# Patient Record
Sex: Female | Born: 1940
Health system: Southern US, Community
[De-identification: ages and names within clinical notes are randomized; demographics above are authoritative.]

## PROBLEM LIST (undated history)

## (undated) DIAGNOSIS — R51 Headache: Secondary | ICD-10-CM

## (undated) DIAGNOSIS — D649 Anemia, unspecified: Secondary | ICD-10-CM

## (undated) DIAGNOSIS — F419 Anxiety disorder, unspecified: Secondary | ICD-10-CM

## (undated) DIAGNOSIS — R7309 Other abnormal glucose: Secondary | ICD-10-CM

## (undated) DIAGNOSIS — Z923 Personal history of irradiation: Secondary | ICD-10-CM

## (undated) DIAGNOSIS — F329 Major depressive disorder, single episode, unspecified: Secondary | ICD-10-CM

## (undated) DIAGNOSIS — IMO0001 Reserved for inherently not codable concepts without codable children: Secondary | ICD-10-CM

## (undated) DIAGNOSIS — R519 Headache, unspecified: Secondary | ICD-10-CM

## (undated) DIAGNOSIS — M810 Age-related osteoporosis without current pathological fracture: Secondary | ICD-10-CM

## (undated) DIAGNOSIS — R0600 Dyspnea, unspecified: Secondary | ICD-10-CM

## (undated) DIAGNOSIS — Z5189 Encounter for other specified aftercare: Secondary | ICD-10-CM

## (undated) DIAGNOSIS — G4700 Insomnia, unspecified: Secondary | ICD-10-CM

## (undated) DIAGNOSIS — C801 Malignant (primary) neoplasm, unspecified: Secondary | ICD-10-CM

## (undated) DIAGNOSIS — R112 Nausea with vomiting, unspecified: Secondary | ICD-10-CM

## (undated) DIAGNOSIS — E785 Hyperlipidemia, unspecified: Secondary | ICD-10-CM

## (undated) DIAGNOSIS — K219 Gastro-esophageal reflux disease without esophagitis: Secondary | ICD-10-CM

## (undated) DIAGNOSIS — I10 Essential (primary) hypertension: Secondary | ICD-10-CM

## (undated) DIAGNOSIS — Z9889 Other specified postprocedural states: Secondary | ICD-10-CM

## (undated) DIAGNOSIS — C50919 Malignant neoplasm of unspecified site of unspecified female breast: Secondary | ICD-10-CM

## (undated) DIAGNOSIS — F32A Depression, unspecified: Secondary | ICD-10-CM

## (undated) DIAGNOSIS — K5790 Diverticulosis of intestine, part unspecified, without perforation or abscess without bleeding: Secondary | ICD-10-CM

## (undated) HISTORY — DX: Diverticulosis of intestine, part unspecified, without perforation or abscess without bleeding: K57.90

## (undated) HISTORY — DX: Essential (primary) hypertension: I10

## (undated) HISTORY — DX: Age-related osteoporosis without current pathological fracture: M81.0

## (undated) HISTORY — DX: Hyperlipidemia, unspecified: E78.5

## (undated) HISTORY — DX: Reserved for inherently not codable concepts without codable children: IMO0001

## (undated) HISTORY — DX: Encounter for other specified aftercare: Z51.89

## (undated) HISTORY — PX: BREAST BIOPSY: SHX20

## (undated) HISTORY — DX: Other abnormal glucose: R73.09

## (undated) HISTORY — DX: Anxiety disorder, unspecified: F41.9

## (undated) HISTORY — DX: Anemia, unspecified: D64.9

## (undated) HISTORY — PX: FRACTURE SURGERY: SHX138

## (undated) HISTORY — PX: SIGMOIDOSCOPY: SUR1295

## (undated) HISTORY — DX: Insomnia, unspecified: G47.00

## (undated) HISTORY — DX: Personal history of irradiation: Z92.3

## (undated) HISTORY — PX: ORIF FEMUR FRACTURE: SHX2119

## (undated) HISTORY — DX: Malignant (primary) neoplasm, unspecified: C80.1

## (undated) HISTORY — DX: Malignant neoplasm of unspecified site of unspecified female breast: C50.919

## (undated) HISTORY — DX: Gastro-esophageal reflux disease without esophagitis: K21.9

---

## 1973-06-14 HISTORY — PX: TUBAL LIGATION: SHX77

## 1981-06-14 DIAGNOSIS — Z5189 Encounter for other specified aftercare: Secondary | ICD-10-CM

## 1981-06-14 HISTORY — DX: Encounter for other specified aftercare: Z51.89

## 1981-06-14 HISTORY — PX: ABDOMINAL HYSTERECTOMY: SHX81

## 1985-06-14 HISTORY — PX: BILATERAL OOPHORECTOMY: SHX1221

## 1997-06-14 HISTORY — PX: INGUINAL HERNIA REPAIR: SUR1180

## 1998-01-07 ENCOUNTER — Inpatient Hospital Stay (HOSPITAL_COMMUNITY): Admission: EM | Admit: 1998-01-07 | Discharge: 1998-01-10 | Payer: Self-pay | Admitting: Emergency Medicine

## 2000-11-04 ENCOUNTER — Other Ambulatory Visit: Admission: RE | Admit: 2000-11-04 | Discharge: 2000-11-04 | Payer: Self-pay | Admitting: Obstetrics & Gynecology

## 2004-05-12 ENCOUNTER — Encounter: Admission: RE | Admit: 2004-05-12 | Discharge: 2004-05-12 | Payer: Self-pay | Admitting: *Deleted

## 2006-06-27 ENCOUNTER — Encounter: Payer: Self-pay | Admitting: Family Medicine

## 2006-11-15 ENCOUNTER — Encounter: Admission: RE | Admit: 2006-11-15 | Discharge: 2006-11-15 | Payer: Self-pay | Admitting: Obstetrics & Gynecology

## 2007-05-22 ENCOUNTER — Encounter: Admission: RE | Admit: 2007-05-22 | Discharge: 2007-05-22 | Payer: Self-pay | Admitting: Obstetrics & Gynecology

## 2007-07-11 ENCOUNTER — Encounter: Payer: Self-pay | Admitting: Family Medicine

## 2007-07-11 LAB — CONVERTED CEMR LAB: Vit D, 25-Hydroxy: 25.6 ng/mL

## 2007-07-26 ENCOUNTER — Encounter: Payer: Self-pay | Admitting: Family Medicine

## 2008-03-04 ENCOUNTER — Encounter: Admission: RE | Admit: 2008-03-04 | Discharge: 2008-03-04 | Payer: Self-pay | Admitting: Family Medicine

## 2008-03-04 LAB — HM MAMMOGRAPHY: HM Mammogram: NORMAL

## 2008-04-09 ENCOUNTER — Encounter: Payer: Self-pay | Admitting: Family Medicine

## 2008-04-09 LAB — CONVERTED CEMR LAB
Creatinine, Ser: 0.7 mg/dL
Glucose, Bld: 143 mg/dL

## 2009-07-04 ENCOUNTER — Encounter: Payer: Self-pay | Admitting: Family Medicine

## 2009-07-04 LAB — CONVERTED CEMR LAB
Cholesterol: 127 mg/dL
Creatinine, Ser: 0.6 mg/dL
Direct LDL: 75 mg/dL
Glucose, Bld: 119 mg/dL
Vit D, 25-Hydroxy: 22.3 ng/mL

## 2010-01-02 ENCOUNTER — Ambulatory Visit: Payer: Self-pay | Admitting: Family Medicine

## 2010-01-02 DIAGNOSIS — I10 Essential (primary) hypertension: Secondary | ICD-10-CM | POA: Insufficient documentation

## 2010-01-02 DIAGNOSIS — F411 Generalized anxiety disorder: Secondary | ICD-10-CM | POA: Insufficient documentation

## 2010-01-07 ENCOUNTER — Ambulatory Visit: Payer: Self-pay | Admitting: Family Medicine

## 2010-01-09 ENCOUNTER — Encounter: Payer: Self-pay | Admitting: Family Medicine

## 2010-01-09 LAB — CONVERTED CEMR LAB
ALT: 32 units/L (ref 0–35)
AST: 32 units/L (ref 0–37)
Albumin: 4 g/dL (ref 3.5–5.2)
Alkaline Phosphatase: 55 units/L (ref 39–117)
BUN: 12 mg/dL (ref 6–23)
Bilirubin, Direct: 0.2 mg/dL (ref 0.0–0.3)
CO2: 26 meq/L (ref 19–32)
Calcium: 9.4 mg/dL (ref 8.4–10.5)
Chloride: 96 meq/L (ref 96–112)
Cholesterol: 197 mg/dL (ref 0–200)
Creatinine, Ser: 0.7 mg/dL (ref 0.4–1.2)
GFR calc non Af Amer: 90.68 mL/min (ref >60)
Glucose, Bld: 129 mg/dL — ABNORMAL HIGH (ref 70–99)
HDL: 26.6 mg/dL — ABNORMAL LOW (ref >39.00)
LDL Cholesterol: 136 mg/dL — ABNORMAL HIGH (ref 0–99)
Potassium: 4.9 meq/L (ref 3.5–5.1)
Sodium: 132 meq/L — ABNORMAL LOW (ref 135–145)
Total Bilirubin: 0.7 mg/dL (ref 0.3–1.2)
Total CHOL/HDL Ratio: 7
Total Protein: 7.9 g/dL (ref 6.0–8.3)
Triglycerides: 174 mg/dL — ABNORMAL HIGH (ref 0.0–149.0)
VLDL: 34.8 mg/dL (ref 0.0–40.0)

## 2010-01-14 ENCOUNTER — Ambulatory Visit: Payer: Self-pay | Admitting: Family Medicine

## 2010-01-15 LAB — CONVERTED CEMR LAB: Glucose, Bld: 136 mg/dL — ABNORMAL HIGH (ref 70–99)

## 2010-01-27 ENCOUNTER — Encounter: Payer: Self-pay | Admitting: Family Medicine

## 2010-01-27 DIAGNOSIS — K219 Gastro-esophageal reflux disease without esophagitis: Secondary | ICD-10-CM

## 2010-01-27 DIAGNOSIS — M810 Age-related osteoporosis without current pathological fracture: Secondary | ICD-10-CM

## 2010-01-27 DIAGNOSIS — E785 Hyperlipidemia, unspecified: Secondary | ICD-10-CM

## 2010-01-27 DIAGNOSIS — G4700 Insomnia, unspecified: Secondary | ICD-10-CM | POA: Insufficient documentation

## 2010-01-28 ENCOUNTER — Ambulatory Visit: Payer: Self-pay | Admitting: Family Medicine

## 2010-01-28 DIAGNOSIS — E119 Type 2 diabetes mellitus without complications: Secondary | ICD-10-CM | POA: Insufficient documentation

## 2010-01-29 LAB — CONVERTED CEMR LAB: Hgb A1c MFr Bld: 7 % — ABNORMAL HIGH (ref 4.6–6.5)

## 2010-05-19 ENCOUNTER — Ambulatory Visit: Payer: Self-pay | Admitting: Family Medicine

## 2010-06-09 ENCOUNTER — Telehealth: Payer: Self-pay | Admitting: Family Medicine

## 2010-06-18 ENCOUNTER — Ambulatory Visit
Admission: RE | Admit: 2010-06-18 | Discharge: 2010-06-18 | Payer: Self-pay | Source: Home / Self Care | Attending: Family Medicine | Admitting: Family Medicine

## 2010-06-18 ENCOUNTER — Other Ambulatory Visit: Payer: Self-pay | Admitting: Family Medicine

## 2010-06-18 LAB — HEMOGLOBIN A1C: Hgb A1c MFr Bld: 7 % — ABNORMAL HIGH (ref 4.6–6.5)

## 2010-06-19 ENCOUNTER — Encounter (INDEPENDENT_AMBULATORY_CARE_PROVIDER_SITE_OTHER): Payer: Self-pay | Admitting: *Deleted

## 2010-07-03 ENCOUNTER — Ambulatory Visit
Admission: RE | Admit: 2010-07-03 | Discharge: 2010-07-03 | Payer: Self-pay | Source: Home / Self Care | Attending: Family Medicine | Admitting: Family Medicine

## 2010-07-03 ENCOUNTER — Encounter: Payer: Self-pay | Admitting: Family Medicine

## 2010-07-03 DIAGNOSIS — M25519 Pain in unspecified shoulder: Secondary | ICD-10-CM | POA: Insufficient documentation

## 2010-07-16 ENCOUNTER — Other Ambulatory Visit: Payer: Self-pay

## 2010-07-16 ENCOUNTER — Other Ambulatory Visit: Payer: Self-pay | Admitting: Family Medicine

## 2010-07-16 ENCOUNTER — Encounter (INDEPENDENT_AMBULATORY_CARE_PROVIDER_SITE_OTHER): Payer: Self-pay | Admitting: *Deleted

## 2010-07-16 DIAGNOSIS — Z1289 Encounter for screening for malignant neoplasm of other sites: Secondary | ICD-10-CM

## 2010-07-16 NOTE — Miscellaneous (Signed)
  Clinical Lists Changes  Problems: Added new problem of ANXIETY (ICD-300.00) Added new problem of GERD (ICD-530.81) Added new problem of HYPERLIPIDEMIA (ICD-272.4) Added new problem of HYPERTENSION (ICD-401.9) Added new problem of OSTEOPOROSIS (ICD-733.00) Added new problem of INSOMNIA (ICD-780.52) Observations: Added new observation of SOCIAL HX: married 40+ years. nonsmoker.  no etoh Drug use-no Regular exercise-yes Children:  2 boys, 2 girls (01/27/2010 15:21) Added new observation of REGULAREXERC: yes (01/27/2010 15:21) Added new observation of DRUG USE: no (01/27/2010 15:21) Added new observation of PAST MED HX: INSOMNIA (ICD-780.52) OSTEOPOROSIS (ICD-733.00) HYPERTENSION (ICD-401.9) HYPERLIPIDEMIA (ICD-272.4) GERD (ICD-530.81) ANXIETY (ICD-300.00) OTHER ABNORMAL GLUCOSE (ICD-790.29) ANXIETY STATE, UNSPECIFIED (ICD-300.00) ESSENTIAL HYPERTENSION (ICD-401.9)   (01/27/2010 15:21) Added new observation of PMH OSTEOPRS: yes (01/27/2010 15:21) Added new observation of HX OF HTN: yes (01/27/2010 15:21) Added new observation of HYPRLIPIDMIA: yes (01/27/2010 15:21) Added new observation of PMH GERD: yes (01/27/2010 15:21) Added new observation of PMH ANXIETY: yes (01/27/2010 15:21) Added new observation of PAST SURG HX: 1983   Hysterectomy 1987   Ovaries removed 1999   Hernia surgery 1975   Tubal ligation (01/27/2010 15:21) Added new observation of DEXANXTDUE: 07/2009 (01/27/2010 15:21) Added new observation of VIT D 25-OH: 22.3 ng/mL (07/04/2009 15:21) Added new observation of CREATININE: 0.60 mg/dL (16/03/9603 54:09) Added new observation of BUN: 6 mg/dL (81/19/1478 29:56) Added new observation of BG RANDOM: 119 mg/dL (21/30/8657 84:69) Added new observation of LDL DIR: 75 mg/dL (62/95/2841 32:44) Added new observation of CHOL/HDL: 3.53  (07/04/2009 15:21) Added new observation of HDL: 36 mg/dL (06/16/7251 66:44) Added new observation of TRIGLYC TOT: 104 mg/dL (03/47/4259  56:38) Added new observation of CHOLESTEROL: 127 mg/dL (75/64/3329 51:88) Added new observation of FLU VAX: given  (04/04/2009 15:40) Added new observation of ECHOCARDIOGR: no LVH or other probs. EF up to 60%  (04/17/2008 15:21) Added new observation of CREATININE: 0.70 mg/dL (41/66/0630 16:01) Added new observation of BG RANDOM: 143 mg/dL (09/32/3557 32:20) Added new observation of MAMMO DUE: 09.21.2010  (03/04/2008 15:21) Added new observation of MAMMOGRAM: Normal  (03/04/2008 15:21) Added new observation of BONE DENSITY: osteoporosis std dev (07/27/2007 15:29) Added new observation of TD BOOSTER: Td  (07/13/2007 15:29) Added new observation of TSH: 2.03 microintl units/mL (07/11/2007 15:21) Added new observation of VIT D 25-OH: 25.6 ng/mL (07/11/2007 15:21) Added new observation of PNEUMOVAX: given  (06/15/2003 15:29)      Preventive Care Screening  Bone Density:    Date:  07/27/2007    Next Due:  07/2009    Results:  osteoporosis std dev  Last Flu Shot:    Date:  04/04/2009    Results:  given   Mammogram:    Date:  03/04/2008    Next Due:  09.21.2010     Results:  Normal   Last Tetanus Booster:    Date:  07/13/2007    Results:  Td   Last Pneumovax:    Date:  06/15/2003    Results:  given     Past History:  Past Medical History: INSOMNIA (ICD-780.52) OSTEOPOROSIS (ICD-733.00) HYPERTENSION (ICD-401.9) HYPERLIPIDEMIA (ICD-272.4) GERD (ICD-530.81) ANXIETY (ICD-300.00) OTHER ABNORMAL GLUCOSE (ICD-790.29) ANXIETY STATE, UNSPECIFIED (ICD-300.00) ESSENTIAL HYPERTENSION (ICD-401.9)    Past Surgical History: 1983   Hysterectomy 1987   Ovaries removed 1999   Hernia surgery 1975   Tubal ligation   Social History: married 40+ years. nonsmoker.  no etoh Drug use-no Regular exercise-yes Children:  2 boys, 2 girlsDrug Use:  no Does Patient Exercise:  yes

## 2010-07-16 NOTE — Assessment & Plan Note (Signed)
Summary: Discuss [name]   Vital Signs:  Patient profile:   70 year old female Height:      61.5 inches Weight:      155.25 pounds BMI:     28.96 Temp:     98 degrees F oral Pulse rate:   88 / minute Pulse rhythm:   regular BP sitting:   122 / 80  (left arm) Cuff size:   regular  Vitals Entered By: Delilah Shan CMA Duncan Dull) (January 28, 2010 2:57 PM) CC: Discuss Natalie   History of Present Illness: New Dx DM2. Labs reviewed with patient.  Had borderline glucose several years ago.  Lost weight but had stopped walking and gained it back.  Feeling well o/w. See plan.   Allergies: 1)  ! Cataflam (Diclofenac Potassium) 2)  ! Sulfa 3)  ! Aleve (Naproxen Sodium) 4)  ! Vytorin 5)  ! Simvastatin  Past History:  Past Medical History: INSOMNIA (ICD-780.52) OSTEOPOROSIS (ICD-733.00) HYPERTENSION (ICD-401.9) HYPERLIPIDEMIA (ICD-272.4) GERD (ICD-530.81) ANXIETY (ICD-300.00) OTHER ABNORMAL GLUCOSE (ICD-790.29) ANXIETY STATE, UNSPECIFIED (ICD-300.00) ESSENTIAL HYPERTENSION (ICD-401.9) DM2 2011  Physical Exam  General:  GEN: nad, alert and oriented HEENT: mucous membranes moist NECK: supple w/o LA CV: rrr.  no murmur PULM: ctab, no inc wob ABD: soft, +bs EXT: no edema SKIN: no acute rash   Impression & Recommendations:  Problem # 1:  DIABETES-TYPE 2 (ICD-250.00) >25 min spent with patient, at least half of which was spent on counseling re:DM2.  Pt to start walking 3x/week and check glucose weekly.  Will work on breakfast first- increase fiber and decrease the sugar in her cereal.  Depending on A1c, we can talk about other options. Gradual weight loss is the goal,  ~1 lb per week.  She understands.  No need for new meds today.  Her updated medication list for this problem includes:    Lisinopril 40 Mg Tabs (Lisinopril) .Marland Kitchen... Take 1 tablet by mouth once a day  Orders: TLB-A1C / Hgb A1C (Glycohemoglobin) (83036-A1C)  Complete Medication List: 1)  Lisinopril 40 Mg Tabs  (Lisinopril) .... Take 1 tablet by mouth once a day 2)  Metoprolol Tartrate 50 Mg Tabs (Metoprolol tartrate) .... Take 1 tablet by mouth once a day 3)  Omeprazole 20 Mg Cpdr (Omeprazole) .... Take 1 tablet by mouth once a day 4)  Sertraline Hcl 100 Mg Tabs (Sertraline hcl) .... Take 1 tablet by mouth once a day 5)  Amlodipine Besylate 5 Mg Tabs (Amlodipine besylate) .... Take 1 tablet by mouth once a day 6)  Lorazepam 1 Mg Tabs (Lorazepam) .... Take 1 tablet by mouth 2-3 times a day prn anxiety, #215 per 90 days 7)  Fish Oil Oil (Fish oil) .... 1,000 mg. takes 2 capsules by mouth two times a day  Patient Instructions: 1)  We'll contact you with your lab report.  2)  I want to recheck your A1c in 3 months- you can get this drawn in [place] if that is more convenient.   I would like to see you back a few days after your lab was drawn. 3)  Please work on walking 3-4 times a week and start comparing labels at the grocery store.  I would look for a cereal that has more fiber and less sugar. 4)  Try to avoid sweets. 5)  Check your sugar once a week before your eat.  Let me know if the numbers are getting higher.   Current Allergies (reviewed today): ! CATAFLAM (DICLOFENAC POTASSIUM) !  SULFA ! ALEVE (NAPROXEN SODIUM) ! VYTORIN ! SIMVASTATIN

## 2010-07-16 NOTE — Letter (Signed)
Summary: [place] No Show Letter  [place] [facility]  [address] [place], [place] [address]   Phone: [phone]  Fax: [phone]    06/19/2010 MRN: [id number]  [facility] [address] [place], [place]  [address]   Dear Ms. Cart,   Our records indicate that you missed your scheduled appointment with _______Lab______________ on ____1/5/12________.  Please contact this office to reschedule your appointment as soon as possible.  It is important that you keep your scheduled appointments with your physician, so we can provide you the best care possible.  Please be advised that there may be a charge for "no show" appointments.    Sincerely,   [place] [facility]

## 2010-07-16 NOTE — Letter (Signed)
Summary: [facility]: Immunoassay Fecal Occult Blood (iFOB) Order Form  [place] [facility]  [address] [place], [place] [address]   Phone: [phone]  Fax: [phone]       Lab: Immunoassay Fecal Occult Blood (iFOB) Order Form   July 03, 2010 MRN: [id number]   [name] 12-02-1940   Physicican Name:______duncan___________________  Diagnosis Code:________v76.49__________________      Crawford Givens MD

## 2010-07-16 NOTE — Letter (Signed)
Summary: Nature conservation officer [employer] Wellness Visit Questionnaire   [employer] Annual Wellness Visit Questionnaire   Imported By: Beau Fanny 07/07/2010 11:01:38  _____________________________________________________________________  External Attachment:    Type:   Image     Comment:   External Document

## 2010-07-16 NOTE — Progress Notes (Signed)
Summary: lorazepam-- order for labs  Phone Note Refill Request Message from:  Fax from Pharmacy on June 09, 2010 10:02 AM  Refills Requested: Medication #1:  LORAZEPAM 1 MG TABS Take 1 tablet by mouth 2-3 times a day prn anxiety   Supply Requested: 1 month   Last Refilled: 01/13/2010 [facility] [phone]   Method Requested: Electronic Initial call taken by: Benny Lennert CMA Duncan Dull),  June 09, 2010 10:02 AM  Follow-up for Phone Call        she needs A1c-250.00- with OV a few days after that.  see prev OV notes.  please call in rx.   Follow-up by: Crawford Givens MD,  June 10, 2010 12:37 PM  Additional Follow-up for Phone Call Additional follow up Details #1::        Medication phoned to pharmacy.   Left message with husband to have patient return call. Lugene Fuquay CMA Duncan Dull)  June 10, 2010 2:36 PM   Advised pt, she says she has lab appt scheduled for 1/05 and 30 minute visit on 1/12.  She is asking if she can have her labs done at [facility] on [address] in [place].  Will she need an order for this? Additional Follow-up by: Lowella Petties CMA, AAMA,  June 10, 2010 3:18 PM    Additional Follow-up for Phone Call Additional follow up Details #2::    She should able to get it done with this note. Please print out and send to patient. She can call ahead and let them know her situation.  They should be able to help her out.  Thanks.  Crawford Givens MD  June 10, 2010 11:21 PM   Additional Follow-up for Phone Call Additional follow up Details #3:: Details for Additional Follow-up Action Taken: I put test in her appt for the 5th, she can go to the [place] lab and they can find it under her appt. Regino Bellow,  June 11, 2010 4:24 PM  noted.  Crawford Givens MD  June 11, 2010 4:28 PM   Prescriptions: LORAZEPAM 1 MG TABS (LORAZEPAM) Take 1 tablet by mouth 2-3 times a day prn anxiety, #215 per 90 days  #215 x 1   Entered and Authorized by:   Crawford Givens MD  Signed by:   Crawford Givens MD on 06/10/2010   Method used:   Telephoned to ...       [facility] [phone]* (retail)       [address] [place], [place]  [address]       Ph: [phone]       Fax: [phone]   RxID:   [id number]   Appended Document: lorazepam-- order for labs Lab order mailed to patient with instructions to take this with her to the lab appointment.

## 2010-07-16 NOTE — Assessment & Plan Note (Signed)
Summary: [name] FROM [facility]   Vital Signs:  Patient profile:   70 year old female Height:      61.5 inches Weight:      156.50 pounds BMI:     29.20 Temp:     97.9 degrees F oral Pulse rate:   64 / minute Pulse rhythm:   regular BP sitting:   156 / 90  (left arm) Cuff size:   regular  Vitals Entered By: Delilah Shan CMA Melainie Krinsky Dull) (January 02, 2010 2:31 PM) CC: Transfer from [place]   History of Present Illness: Hypertension:      Using medication without problems or lightheadedness: yes Chest pain with exertion: no Edema:no Short of breath: when it happens it is minimal and at night, not a daily occurrence Average home BPs: see below Other issues: calibrated cuff today and home cuff had some discrepancy from home readings.  Her readings at home have been lower.  She usually has white coat reaction.   prev was on vytorin and had myalgias.  stopped simvastatin due to muscle aches in legs.  Better of the meds.  last labs were about 1 year ago.  Due for labs.   Anxiety- continues on prev meds.  Some relief with the meds.  This has been going on for years.  Need refills on all meds.  She has used BZD responsibly and w/o complication.   Current Medications (verified): 1)  Lisinopril 40 Mg Tabs (Lisinopril) .... Take 1 Tablet By Mouth Once A Day 2)  Metoprolol Tartrate 50 Mg Tabs (Metoprolol Tartrate) .... Take 1 Tablet By Mouth Once A Day 3)  Omeprazole 20 Mg Cpdr (Omeprazole) .... Take 1 Tablet By Mouth Once A Day 4)  Sertraline Hcl 100 Mg Tabs (Sertraline Hcl) .... Take 1 Tablet By Mouth Once A Day 5)  Amlodipine Besylate 5 Mg Tabs (Amlodipine Besylate) .... Take 1 Tablet By Mouth Once A Day 6)  Lorazepam 1 Mg Tabs (Lorazepam) .... Take 1 Tablet By Mouth Once A Day 7)  Fish Oil   Oil (Fish Oil) .... 1,000 Mg. Takes 2 Capsules By Mouth Two Times A Day  Allergies (verified): 1)  ! Cataflam (Diclofenac Potassium) 2)  ! Sulfa 3)  ! Aleve (Naproxen Sodium) 4)  ! Vytorin 5)  !  Simvastatin  Social History: Reviewed history and no changes required. married 40+ years. nonsmoker.  no etoh  Review of Systems       See HPI.  Otherwise noncontributory.    Physical Exam  General:  GEN: nad, alert and oriented HEENT: mucous membranes moist NECK: supple w/o LA CV: rrr.  no murmur PULM: ctab, no inc wob ABD: soft, +bs EXT: no edema SKIN: no acute rash  Mood: affect wnl, she was slighlty anxious to begin with but calmed down as the exam went along   Impression & Recommendations:  Problem # 1:  ANXIETY STATE, UNSPECIFIED (ICD-300.00) Rxs done.  has used BZD responsibly in past w/o abuse,misuse.  Continue SSRI.  Will request records.  25 min spent wit patient.  Her updated medication list for this problem includes:    Sertraline Hcl 100 Mg Tabs (Sertraline hcl) .Marland Kitchen... Take 1 tablet by mouth once a day    Lorazepam 1 Mg Tabs (Lorazepam) .Marland Kitchen... Take 1 tablet by mouth 2-3 times a day prn anxiety, #215 per 90 days  Problem # 2:  ESSENTIAL HYPERTENSION (ICD-401.9) No change in meds, continue to monitor BP at home.  I think anxiety contributed to her  BP today in clinic.  Recheck was 140/80 today in the clinic.  Hard copy was written for cmet/lipid orders Her updated medication list for this problem includes:    Lisinopril 40 Mg Tabs (Lisinopril) .Marland Kitchen... Take 1 tablet by mouth once a day    Metoprolol Tartrate 50 Mg Tabs (Metoprolol tartrate) .Marland Kitchen... Take 1 tablet by mouth once a day    Amlodipine Besylate 5 Mg Tabs (Amlodipine besylate) .Marland Kitchen... Take 1 tablet by mouth once a day  Complete Medication List: 1)  Lisinopril 40 Mg Tabs (Lisinopril) .... Take 1 tablet by mouth once a day 2)  Metoprolol Tartrate 50 Mg Tabs (Metoprolol tartrate) .... Take 1 tablet by mouth once a day 3)  Omeprazole 20 Mg Cpdr (Omeprazole) .... Take 1 tablet by mouth once a day 4)  Sertraline Hcl 100 Mg Tabs (Sertraline hcl) .... Take 1 tablet by mouth once a day 5)  Amlodipine Besylate 5 Mg Tabs  (Amlodipine besylate) .... Take 1 tablet by mouth once a day 6)  Lorazepam 1 Mg Tabs (Lorazepam) .... Take 1 tablet by mouth 2-3 times a day prn anxiety, #215 per 90 days 7)  Fish Oil Oil (Fish oil) .... 1,000 mg. takes 2 capsules by mouth two times a day   Patient Instructions: 1)  Get your labs drawn fasting at a  site.  Call them ahead of time to make sure they have you on their schedule.  We'll contact you with your lab report.  I would get scheduled for a physical in Jan or Feb 2012.    2)  fasting labs: CMET/lipid 401.1 Prescriptions: AMLODIPINE BESYLATE 5 MG TABS (AMLODIPINE BESYLATE) Take 1 tablet by mouth once a day  #90 x 3   Entered and Authorized by:   Crawford Givens MD   Signed by:   Crawford Givens MD on 01/02/2010   Method used:   Print then Give to Patient   RxID:   [id number] SERTRALINE HCL 100 MG TABS (SERTRALINE HCL) Take 1 tablet by mouth once a day  #90 x 3   Entered and Authorized by:   Crawford Givens MD   Signed by:   Crawford Givens MD on 01/02/2010   Method used:   Print then Give to Patient   RxID:   [id number] OMEPRAZOLE 20 MG CPDR (OMEPRAZOLE) Take 1 tablet by mouth once a day  #90 x 3   Entered and Authorized by:   Crawford Givens MD   Signed by:   Crawford Givens MD on 01/02/2010   Method used:   Print then Give to Patient   RxID:   [id number] METOPROLOL TARTRATE 50 MG TABS (METOPROLOL TARTRATE) Take 1 tablet by mouth once a day  #90 x 3   Entered and Authorized by:   Crawford Givens MD   Signed by:   Crawford Givens MD on 01/02/2010   Method used:   Print then Give to Patient   RxID:   [id number] LISINOPRIL 40 MG TABS (LISINOPRIL) Take 1 tablet by mouth once a day  #90 x 3   Entered and Authorized by:   Crawford Givens MD   Signed by:   Crawford Givens MD on 01/02/2010   Method used:   Print then Give to Patient   RxID:   [id number] LORAZEPAM 1 MG TABS (LORAZEPAM) Take 1 tablet by mouth 2-3 times a day prn anxiety, #215 per 90 days   #215 x 1   Entered and Authorized by:   Crawford Givens MD  Signed by:   Crawford Givens MD on 01/02/2010   Method used:   Print then Give to Patient   RxID:   [id number]   Current Allergies (reviewed today): ! CATAFLAM (DICLOFENAC POTASSIUM) ! SULFA ! ALEVE (NAPROXEN SODIUM) ! VYTORIN ! SIMVASTATIN

## 2010-07-16 NOTE — Medication Information (Signed)
Summary: Lab Orders  Lab Orders   Imported By: Marylou Mccoy 01/21/2010 17:31:47  _____________________________________________________________________  External Attachment:    Type:   Image     Comment:   External Document

## 2010-07-16 NOTE — Assessment & Plan Note (Signed)
Summary: FLU SHOT / LFW  Nurse Visit   Allergies: 1)  ! Cataflam (Diclofenac Potassium) 2)  ! Sulfa 3)  ! Aleve (Naproxen Sodium) 4)  ! Vytorin 5)  ! Simvastatin  Orders Added: 1)  Flu Vaccine 81yrs + MEDICARE PATIENTS [Q2039] 2)  Administration Flu vaccine - MCR [G0008]   Flu Vaccine Consent Questions     Do you have a history of severe allergic reactions to this vaccine? no    Any prior history of allergic reactions to egg and/or gelatin? no    Do you have a sensitivity to the preservative Thimersol? no    Do you have a past history of Guillan-Barre Syndrome? no    Do you currently have an acute febrile illness? no    Have you ever had a severe reaction to latex? no    Vaccine information given and explained to patient? yes    Are you currently pregnant? no    Lot Number:[id number]   Exp Date:12/12/2010   Site Given  Left Deltoid IM
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
## 2010-07-16 NOTE — Medication Information (Signed)
Summary: Physician's Orders   Physician's Orders   Imported By: Roderic Ovens 01/20/2010 14:10:53  _____________________________________________________________________  External Attachment:    Type:   Image     Comment:   External Document

## 2010-07-22 NOTE — Letter (Signed)
Summary: Results Follow up Letter  [place] [facility]  [address] [place], [place] [address]   Phone: [phone]  Fax: [phone]    07/16/2010 MRN: [id number]    [facility] [address] [place], [place]  [address]  [place]    Dear Ms. Navratil,  The following are the results of your recent test(s):  Test         Result    Pap Smear:        Normal _____  Not Normal _____ Comments: ______________________________________________________ Cholesterol: LDL(Bad cholesterol):         Your goal is less than:         HDL (Good cholesterol):       Your goal is more than: Comments:  ______________________________________________________ Mammogram:        Normal _____  Not Normal _____ Comments:  ___________________________________________________________________ Hemoccult:        Normal __X___  Not normal _______ Comments:  Yearly follow up is recommended.   _____________________________________________________________________ Other Tests:    We routinely do not discuss normal results over the telephone.  If you desire a copy of the results, or you have any questions about this information we can discuss them at your next office visit.   Sincerely,    Dwana Curd. Para March, [name]  [facility]

## 2010-08-10 ENCOUNTER — Telehealth: Payer: Self-pay | Admitting: Family Medicine

## 2010-08-20 NOTE — Progress Notes (Signed)
Summary: refill request for lorazepam  Phone Note Refill Request Call back at Home Phone [phone] Message from:  Patient  Refills Requested: Medication #1:  LORAZEPAM 1 MG TABS Take 1 tablet by mouth 2-3 times a day prn anxiety Pt states she takes 2-3 during the day and 2 at night, usually.  She is asking for new script to be called to [facility] with those instructions so that she can get early refill.  Initial call taken by: Lowella Petties CMA, AAMA,  August 10, 2010 3:29 PM  Follow-up for Phone Call        Please tell the patient that I want her to get it 30day supply at a time.  please call in.  thanks.  Follow-up by: Crawford Givens MD,  August 10, 2010 4:31 PM  Additional Follow-up for Phone Call Additional follow up Details #1::        Medication phoned to pharmacy.  Additional Follow-up by: Delilah Shan CMA Neyah Ellerman Dull),  August 10, 2010 4:49 PM    New/Updated Medications: LORAZEPAM 1 MG TABS (LORAZEPAM) Take 1 tablet by mouth 2-3 times a day prn anxiety and 1-2 tabs at night for sleep Prescriptions: LORAZEPAM 1 MG TABS (LORAZEPAM) Take 1 tablet by mouth 2-3 times a day prn anxiety and 1-2 tabs at night for sleep  #150 x 3   Entered and Authorized by:   Crawford Givens MD   Signed by:   Crawford Givens MD on 08/10/2010   Method used:   Telephoned to ...       [facility] [phone]* (retail)       [address] [place], [place]  [address]       Ph: [phone]       Fax: [phone]   RxID:   [id number]

## 2010-10-15 ENCOUNTER — Emergency Department (HOSPITAL_COMMUNITY): Payer: Medicare Other

## 2010-10-15 ENCOUNTER — Inpatient Hospital Stay (HOSPITAL_COMMUNITY)
Admission: EM | Admit: 2010-10-15 | Discharge: 2010-10-20 | DRG: 482 | Disposition: A | Discharge status: Rehabilitation Facility | Payer: Medicare Other | Attending: Specialist | Admitting: Specialist

## 2010-10-15 DIAGNOSIS — K219 Gastro-esophageal reflux disease without esophagitis: Secondary | ICD-10-CM | POA: Diagnosis present

## 2010-10-15 DIAGNOSIS — E1169 Type 2 diabetes mellitus with other specified complication: Secondary | ICD-10-CM | POA: Diagnosis present

## 2010-10-15 DIAGNOSIS — Y998 Other external cause status: Secondary | ICD-10-CM

## 2010-10-15 DIAGNOSIS — Z79899 Other long term (current) drug therapy: Secondary | ICD-10-CM

## 2010-10-15 DIAGNOSIS — S72309A Unspecified fracture of shaft of unspecified femur, initial encounter for closed fracture: Principal | ICD-10-CM | POA: Diagnosis present

## 2010-10-15 DIAGNOSIS — Z01810 Encounter for preprocedural cardiovascular examination: Secondary | ICD-10-CM

## 2010-10-15 DIAGNOSIS — F3289 Other specified depressive episodes: Secondary | ICD-10-CM | POA: Diagnosis present

## 2010-10-15 DIAGNOSIS — D509 Iron deficiency anemia, unspecified: Secondary | ICD-10-CM | POA: Diagnosis not present

## 2010-10-15 DIAGNOSIS — F329 Major depressive disorder, single episode, unspecified: Secondary | ICD-10-CM | POA: Diagnosis present

## 2010-10-15 DIAGNOSIS — R509 Fever, unspecified: Secondary | ICD-10-CM | POA: Diagnosis not present

## 2010-10-15 DIAGNOSIS — Y93C9 Activity, other involving computer technology and electronic devices: Secondary | ICD-10-CM

## 2010-10-15 DIAGNOSIS — E785 Hyperlipidemia, unspecified: Secondary | ICD-10-CM | POA: Diagnosis present

## 2010-10-15 DIAGNOSIS — W010XXA Fall on same level from slipping, tripping and stumbling without subsequent striking against object, initial encounter: Secondary | ICD-10-CM | POA: Diagnosis present

## 2010-10-15 DIAGNOSIS — Y92009 Unspecified place in unspecified non-institutional (private) residence as the place of occurrence of the external cause: Secondary | ICD-10-CM

## 2010-10-15 LAB — DIFFERENTIAL
Basophils Relative: 1 % (ref 0–1)
Lymphs Abs: 2.3 10*3/uL (ref 0.7–4.0)
Monocytes Absolute: 0.6 10*3/uL (ref 0.1–1.0)
Monocytes Relative: 5 % (ref 3–12)
Neutro Abs: 8.1 10*3/uL — ABNORMAL HIGH (ref 1.7–7.7)

## 2010-10-15 LAB — CBC
HCT: 37.2 % (ref 36.0–46.0)
Hemoglobin: 12.7 g/dL (ref 12.0–15.0)
MCH: 28.5 pg (ref 26.0–34.0)
MCHC: 34.1 g/dL (ref 30.0–36.0)
MCV: 83.4 fL (ref 78.0–100.0)

## 2010-10-15 LAB — ABO/RH: ABO/RH(D): O NEG

## 2010-10-15 LAB — POCT CARDIAC MARKERS
CKMB, poc: 2.1 ng/mL (ref 1.0–8.0)
Troponin i, poc: <0.05 ng/mL (ref 0.00–0.09)

## 2010-10-15 LAB — BASIC METABOLIC PANEL
Chloride: 98 mEq/L (ref 96–112)
Creatinine, Ser: 0.65 mg/dL (ref 0.4–1.2)
GFR calc Af Amer: >60 mL/min (ref >60)
GFR calc non Af Amer: >60 mL/min (ref >60)
Potassium: 3.4 mEq/L — ABNORMAL LOW (ref 3.5–5.1)

## 2010-10-15 LAB — APTT: aPTT: 32 seconds (ref 24–37)

## 2010-10-15 LAB — PROTIME-INR: Prothrombin Time: 13.3 seconds (ref 11.6–15.2)

## 2010-10-16 ENCOUNTER — Emergency Department (HOSPITAL_COMMUNITY): Payer: Medicare Other

## 2010-10-16 ENCOUNTER — Inpatient Hospital Stay (HOSPITAL_COMMUNITY): Payer: Medicare Other

## 2010-10-16 LAB — URINALYSIS, ROUTINE W REFLEX MICROSCOPIC
Bilirubin Urine: NEGATIVE
Ketones, ur: NEGATIVE mg/dL
Nitrite: NEGATIVE
Urobilinogen, UA: 0.2 mg/dL (ref 0.0–1.0)
pH: 7 (ref 5.0–8.0)

## 2010-10-16 LAB — PRO B NATRIURETIC PEPTIDE: Pro B Natriuretic peptide (BNP): 177.5 pg/mL — ABNORMAL HIGH (ref 0–125)

## 2010-10-16 LAB — GLUCOSE, CAPILLARY

## 2010-10-17 ENCOUNTER — Inpatient Hospital Stay (HOSPITAL_COMMUNITY): Payer: Medicare Other

## 2010-10-17 DIAGNOSIS — S72453A Displaced supracondylar fracture without intracondylar extension of lower end of unspecified femur, initial encounter for closed fracture: Secondary | ICD-10-CM

## 2010-10-17 LAB — CBC
Hemoglobin: 10.3 g/dL — ABNORMAL LOW (ref 12.0–15.0)
MCH: 28.7 pg (ref 26.0–34.0)
MCHC: 33.7 g/dL (ref 30.0–36.0)
Platelets: 195 10*3/uL (ref 150–400)
RDW: 13.9 % (ref 11.5–15.5)

## 2010-10-17 LAB — URINE MICROSCOPIC-ADD ON

## 2010-10-17 LAB — BASIC METABOLIC PANEL
BUN: 8 mg/dL (ref 6–23)
Creatinine, Ser: 0.65 mg/dL (ref 0.4–1.2)
GFR calc non Af Amer: >60 mL/min (ref >60)
Glucose, Bld: 154 mg/dL — ABNORMAL HIGH (ref 70–99)

## 2010-10-17 LAB — URINALYSIS, ROUTINE W REFLEX MICROSCOPIC
Bilirubin Urine: NEGATIVE
Glucose, UA: NEGATIVE mg/dL
Ketones, ur: NEGATIVE mg/dL
Leukocytes, UA: NEGATIVE
Nitrite: NEGATIVE
Specific Gravity, Urine: 1.013 (ref 1.005–1.030)
pH: 6 (ref 5.0–8.0)

## 2010-10-17 LAB — URINE CULTURE
Colony Count: NO GROWTH
Culture  Setup Time: 201205040424
Culture: NO GROWTH

## 2010-10-17 LAB — FERRITIN: Ferritin: 65 ng/mL (ref 10–291)

## 2010-10-17 LAB — GLUCOSE, CAPILLARY
Glucose-Capillary: 136 mg/dL — ABNORMAL HIGH (ref 70–99)
Glucose-Capillary: 126 mg/dL — ABNORMAL HIGH (ref 70–99)
Glucose-Capillary: 168 mg/dL — ABNORMAL HIGH (ref 70–99)

## 2010-10-17 LAB — VITAMIN B12: Vitamin B-12: 270 pg/mL (ref 211–911)

## 2010-10-17 LAB — PROTIME-INR
INR: 1.15 (ref 0.00–1.49)
Prothrombin Time: 14.9 seconds (ref 11.6–15.2)

## 2010-10-17 LAB — IRON AND TIBC: UIBC: 263 ug/dL

## 2010-10-18 LAB — BASIC METABOLIC PANEL
BUN: 9 mg/dL (ref 6–23)
CO2: 26 mEq/L (ref 19–32)
Calcium: 8.3 mg/dL — ABNORMAL LOW (ref 8.4–10.5)
Creatinine, Ser: 0.62 mg/dL (ref 0.4–1.2)
Glucose, Bld: 124 mg/dL — ABNORMAL HIGH (ref 70–99)

## 2010-10-18 LAB — URINE CULTURE
Colony Count: NO GROWTH
Culture  Setup Time: 201205051744
Culture: NO GROWTH
Special Requests: NEGATIVE

## 2010-10-18 LAB — PROTIME-INR: Prothrombin Time: 21.2 seconds — ABNORMAL HIGH (ref 11.6–15.2)

## 2010-10-18 LAB — CBC
MCH: 27.9 pg (ref 26.0–34.0)
MCHC: 32.5 g/dL (ref 30.0–36.0)
MCV: 85.8 fL (ref 78.0–100.0)
Platelets: 184 10*3/uL (ref 150–400)
RDW: 14 % (ref 11.5–15.5)

## 2010-10-18 LAB — GLUCOSE, CAPILLARY

## 2010-10-19 LAB — BASIC METABOLIC PANEL WITH GFR
BUN: 7 mg/dL (ref 6–23)
CO2: 26 meq/L (ref 19–32)
Calcium: 8.4 mg/dL (ref 8.4–10.5)
Chloride: 103 meq/L (ref 96–112)
Creatinine, Ser: <0.47 mg/dL (ref 0.4–1.2)
Glucose, Bld: 109 mg/dL — ABNORMAL HIGH (ref 70–99)
Potassium: 3.9 meq/L (ref 3.5–5.1)
Sodium: 135 meq/L (ref 135–145)

## 2010-10-19 LAB — CBC
HCT: 26.5 % — ABNORMAL LOW (ref 36.0–46.0)
Hemoglobin: 9 g/dL — ABNORMAL LOW (ref 12.0–15.0)
MCH: 28.9 pg (ref 26.0–34.0)
MCHC: 34 g/dL (ref 30.0–36.0)
MCV: 85.2 fL (ref 78.0–100.0)
Platelets: 178 K/uL (ref 150–400)
RBC: 3.11 MIL/uL — ABNORMAL LOW (ref 3.87–5.11)
RDW: 14.1 % (ref 11.5–15.5)
WBC: 8.5 K/uL (ref 4.0–10.5)

## 2010-10-19 LAB — PROTIME-INR
INR: 2.1 — ABNORMAL HIGH (ref 0.00–1.49)
Prothrombin Time: 23.7 s — ABNORMAL HIGH (ref 11.6–15.2)

## 2010-10-19 LAB — GLUCOSE, CAPILLARY
Glucose-Capillary: 133 mg/dL — ABNORMAL HIGH (ref 70–99)
Glucose-Capillary: 120 mg/dL — ABNORMAL HIGH (ref 70–99)
Glucose-Capillary: 123 mg/dL — ABNORMAL HIGH (ref 70–99)

## 2010-10-19 NOTE — H&P (Signed)
  [name], [name] NO.:  [id number]  MEDICAL RECORD NO.:  [id number]           PATIENT TYPE:  E  LOCATION:  WLED                         FACILITY:  [facility]  PHYSICIAN:  Jene Every, [name]    DATE OF BIRTH:  [birth year]  DATE OF ADMISSION:  10/15/2010 DATE OF DISCHARGE:                             HISTORY & PHYSICAL   CHIEF COMPLAINT:  Right thigh pain.  HISTORY:  70 year old female with history of hypertension; diabetes, non- insulin; fell and tripped while getting out of the theater after screening about 9 o'clock this evening.  Had acute pain and deformity, transferred by EMS to the emergency room, found to have a displaced midshaft femur fracture, underwent a closed reduction in the emergency room, traction.  She was consulted for orthopedic evaluation.  REVIEW OF SYSTEMS:  The patient has no recent chest pain, shortness of breath, or blurred vision.  ALLERGIES: 1. Sulfa. 2. Cataflam. 3. Aleve.  MEDICATIONS: 1. Omeprazole. 2. Sertraline. 3. Metoprolol. 4. Lisinopril. 5. Amlodipine 6. Lorazepam. 7. Alendronate.  SOCIAL HISTORY:  Lives with her husband.  Negative tobacco, negative EtOH.  PHYSICAL EXAMINATION:  EXTREMITIES:  Moderate stress in Buck's traction. Leg in acceptable alignment.  She has dorsiflexion, plantar flexion of foot.  2+ dorsalis pedis and posterior tibial pulse.  Mild to moderate soft tissue swelling.  Compartments are soft.  Calf is soft.  Knee exam is unremarkable.  PELVIS:  Stable.  Nontender of the lumbar spine or the cervical spine.  Left lower extremity is unremarkable.  COR:  Regular rate and rhythm.  PULMONARY:  Clear to auscultation.  ABDOMEN:  Soft and nontender.  LABS:  Sodium 133, potassium 2.4, INR 0.99.  White count 11.2, hemoglobin 12.7, platelets 236.  Creatinine 0.65.  X-rays, displaced midshaft femur fracture.  Post reduction radiographs pending.  IMPRESSION: 1. Closed midshaft femur fracture. 2.  History of diabetes. 3. Hypertension, currently stable.  PLAN:  Closed reduction.  Pain medicine.  Complete preoperative workup followed by intramedullary rod in the right femur.  Risks and benefits discussed including bleeding, infection, nonunion, malunion, DVT, PE, anesthetic complications, etc.  Discussed that with her family in detail.  We will admit her to the hospital and then undergo procedure following clearance.     Jene Every, [name]     Cordelia Pen  D:  10/16/2010  T:  10/16/2010  Job:  [id number]  Electronically Signed by Jene Every [name] on 10/19/2010 02:25:53 PM

## 2010-10-19 NOTE — Op Note (Signed)
[name], [name]              ACCOUNT NO.:  [id number]  MEDICAL RECORD NO.:  [id number]           PATIENT TYPE:  I  LOCATION:  1611                         FACILITY:  [facility]  PHYSICIAN:  Jene Every, [name]    DATE OF BIRTH:  10-12-1940  DATE OF PROCEDURE:  10/15/2010 DATE OF DISCHARGE:                              OPERATIVE REPORT   PREOPERATIVE DIAGNOSIS:  Right femur fracture.  POSTOPERATIVE DIAGNOSIS:  Right femur fracture.  PROCEDURE:  Intramedullary rodding of the right femur with open reduction and internal fixation.  ANESTHESIA:  General.  ASSISTANT:  Roma Schanz, P.A.  BRIEF HISTORY:  A 70 year old with displaced femur fracture indicated for intramedullary rodding.  Risks and benefits discussed including bleeding, infection, DVT, PE, nonunion, malunion.  TECHNIQUE:  The patient in supine position after induction of adequate general anesthesia and 2 g of Kefzol, she was placed on the [facility] fracture table.  All bony points well padded.  Foley to gravity. Peroneal post well-padded.  Right lower extremity longitudinal traction with left lower leg gentle abduction external rotation and flexion. Following reduction maneuver under x-ray, there was improvement in the displacement of her fracture.  The right hip, peritrochanteric region, thigh and leg were prepped and draped in usual sterile fashion.  A surgical incision was made proximal to the greater trochanter. Subcutaneous tissue was dissected.  Electrocautery utilized to achieve hemostasis.  Fascia lata identified and divided in line with the skin incision.  Tip of the trochanter was identified.  Guide pin entered the canal, was overreamed proximally, entered a alignment guide.  Multiple configurations were required to reduce the fracture and put the guidewire across the fracture utilizing the various crutches and distraction forces as well as traction.  This was finally obtained.  We placed a guide pin  across the fracture site down to the physeal line distally, reamed it to 11 mm, selected the 9-mm rod 380 mm in length. After measurement from the tip of the trochanter, inserted the rod without difficulty.  This was down to the physis.  Put a proximal locking screw from the greater trochanter to the lesser trochanter utilizing external alignment guide, drilling, insertion of the appropriate length screw with excellent purchase.  Distally after traction was released, I placed a distal locking screw in the static hole.  Utilizing triangulation guide, a small incision made laterally. Blunt dissection down to the femur, drilled and inserted the screw, appropriate length, bicortical excellent purchase.  The AP and lateral plane had anatomic reduction of the fracture.  Wounds were copiously irrigated.  Fascia repaired with 0 Vicryl in figure-of-eight sutures, subcu with 2-0 Vicryl simple sutures.  Skin was reapproximated with staples both proximally and distally.  We then used electrocautery to achieve strict hemostasis with no active bleeding at the time.  The patient was released from traction.  Leg lengths were equivalent.  Good pulses distally.  Good rotation. She was extubated without difficulty and transported to Recovery in satisfactory condition.  The patient tolerated the procedure well without complications.  BLOOD LOSS:  100 mL.     Jene Every, [name]     Cordelia Pen  D:  10/16/2010  T:  10/16/2010  Job:  [id number]  Electronically Signed by Jene Every [name] on 10/19/2010 02:25:57 PM

## 2010-10-20 ENCOUNTER — Inpatient Hospital Stay (HOSPITAL_COMMUNITY)
Admission: AD | Admit: 2010-10-20 | Discharge: 2010-10-30 | DRG: 945 | Disposition: A | Discharge status: Home Health | Payer: Medicare Other | Source: Ambulatory Visit | Attending: Physical Medicine & Rehabilitation | Admitting: Physical Medicine & Rehabilitation

## 2010-10-20 DIAGNOSIS — W19XXXA Unspecified fall, initial encounter: Secondary | ICD-10-CM | POA: Diagnosis present

## 2010-10-20 DIAGNOSIS — F341 Dysthymic disorder: Secondary | ICD-10-CM | POA: Diagnosis present

## 2010-10-20 DIAGNOSIS — E785 Hyperlipidemia, unspecified: Secondary | ICD-10-CM | POA: Diagnosis present

## 2010-10-20 DIAGNOSIS — S72309A Unspecified fracture of shaft of unspecified femur, initial encounter for closed fracture: Secondary | ICD-10-CM | POA: Diagnosis present

## 2010-10-20 DIAGNOSIS — Z5189 Encounter for other specified aftercare: Principal | ICD-10-CM

## 2010-10-20 DIAGNOSIS — J9819 Other pulmonary collapse: Secondary | ICD-10-CM | POA: Diagnosis present

## 2010-10-20 DIAGNOSIS — S72009A Fracture of unspecified part of neck of unspecified femur, initial encounter for closed fracture: Secondary | ICD-10-CM

## 2010-10-20 DIAGNOSIS — E119 Type 2 diabetes mellitus without complications: Secondary | ICD-10-CM | POA: Diagnosis present

## 2010-10-20 DIAGNOSIS — G4700 Insomnia, unspecified: Secondary | ICD-10-CM | POA: Diagnosis present

## 2010-10-20 DIAGNOSIS — K219 Gastro-esophageal reflux disease without esophagitis: Secondary | ICD-10-CM | POA: Diagnosis present

## 2010-10-20 DIAGNOSIS — I10 Essential (primary) hypertension: Secondary | ICD-10-CM | POA: Diagnosis present

## 2010-10-20 DIAGNOSIS — D62 Acute posthemorrhagic anemia: Secondary | ICD-10-CM | POA: Diagnosis present

## 2010-10-20 LAB — CBC
HCT: 27.8 % — ABNORMAL LOW (ref 36.0–46.0)
MCHC: 33.1 g/dL (ref 30.0–36.0)
MCV: 84.8 fL (ref 78.0–100.0)
RDW: 14.2 % (ref 11.5–15.5)

## 2010-10-20 LAB — BASIC METABOLIC PANEL
BUN: 9 mg/dL (ref 6–23)
Calcium: 9 mg/dL (ref 8.4–10.5)
Creatinine, Ser: <0.47 mg/dL (ref 0.4–1.2)
Glucose, Bld: 110 mg/dL — ABNORMAL HIGH (ref 70–99)

## 2010-10-20 LAB — GLUCOSE, CAPILLARY: Glucose-Capillary: 158 mg/dL — ABNORMAL HIGH (ref 70–99)

## 2010-10-21 DIAGNOSIS — S72009A Fracture of unspecified part of neck of unspecified femur, initial encounter for closed fracture: Secondary | ICD-10-CM

## 2010-10-21 LAB — CBC
Hemoglobin: 9.9 g/dL — ABNORMAL LOW (ref 12.0–15.0)
Platelets: 283 10*3/uL (ref 150–400)
RDW: 14.3 % (ref 11.5–15.5)

## 2010-10-21 LAB — GLUCOSE, CAPILLARY: Glucose-Capillary: 113 mg/dL — ABNORMAL HIGH (ref 70–99)

## 2010-10-21 LAB — COMPREHENSIVE METABOLIC PANEL
ALT: 42 U/L — ABNORMAL HIGH (ref 0–35)
AST: 42 U/L — ABNORMAL HIGH (ref 0–37)
Albumin: 2.5 g/dL — ABNORMAL LOW (ref 3.5–5.2)
Calcium: 9.1 mg/dL (ref 8.4–10.5)
Creatinine, Ser: <0.47 mg/dL (ref 0.4–1.2)
Sodium: 133 mEq/L — ABNORMAL LOW (ref 135–145)
Total Protein: 6.4 g/dL (ref 6.0–8.3)

## 2010-10-21 LAB — DIFFERENTIAL
Basophils Relative: 1 % (ref 0–1)
Monocytes Absolute: 0.7 10*3/uL (ref 0.1–1.0)
Monocytes Relative: 7 % (ref 3–12)
Neutro Abs: 7 10*3/uL (ref 1.7–7.7)

## 2010-10-21 LAB — PROTIME-INR: INR: 1.85 — ABNORMAL HIGH (ref 0.00–1.49)

## 2010-10-22 LAB — PROTIME-INR: Prothrombin Time: 22.4 seconds — ABNORMAL HIGH (ref 11.6–15.2)

## 2010-10-22 LAB — GLUCOSE, CAPILLARY
Glucose-Capillary: 163 mg/dL — ABNORMAL HIGH (ref 70–99)
Glucose-Capillary: 124 mg/dL — ABNORMAL HIGH (ref 70–99)
Glucose-Capillary: 147 mg/dL — ABNORMAL HIGH (ref 70–99)

## 2010-10-23 DIAGNOSIS — S72009A Fracture of unspecified part of neck of unspecified femur, initial encounter for closed fracture: Secondary | ICD-10-CM

## 2010-10-23 LAB — PROTIME-INR: Prothrombin Time: 25.8 seconds — ABNORMAL HIGH (ref 11.6–15.2)

## 2010-10-23 LAB — GLUCOSE, CAPILLARY
Glucose-Capillary: 105 mg/dL — ABNORMAL HIGH (ref 70–99)
Glucose-Capillary: 143 mg/dL — ABNORMAL HIGH (ref 70–99)

## 2010-10-24 LAB — PROTIME-INR: Prothrombin Time: 24 seconds — ABNORMAL HIGH (ref 11.6–15.2)

## 2010-10-24 LAB — GLUCOSE, CAPILLARY
Glucose-Capillary: 108 mg/dL — ABNORMAL HIGH (ref 70–99)
Glucose-Capillary: 135 mg/dL — ABNORMAL HIGH (ref 70–99)

## 2010-10-25 LAB — GLUCOSE, CAPILLARY
Glucose-Capillary: 119 mg/dL — ABNORMAL HIGH (ref 70–99)
Glucose-Capillary: 132 mg/dL — ABNORMAL HIGH (ref 70–99)

## 2010-10-25 LAB — PROTIME-INR
INR: 2.06 — ABNORMAL HIGH (ref 0.00–1.49)
Prothrombin Time: 23.4 seconds — ABNORMAL HIGH (ref 11.6–15.2)

## 2010-10-26 DIAGNOSIS — S72009A Fracture of unspecified part of neck of unspecified femur, initial encounter for closed fracture: Secondary | ICD-10-CM

## 2010-10-26 LAB — GLUCOSE, CAPILLARY
Glucose-Capillary: 116 mg/dL — ABNORMAL HIGH (ref 70–99)
Glucose-Capillary: 110 mg/dL — ABNORMAL HIGH (ref 70–99)
Glucose-Capillary: 132 mg/dL — ABNORMAL HIGH (ref 70–99)

## 2010-10-26 LAB — CBC
HCT: 31 % — ABNORMAL LOW (ref 36.0–46.0)
Hemoglobin: 10.2 g/dL — ABNORMAL LOW (ref 12.0–15.0)
MCV: 86.8 fL (ref 78.0–100.0)
RBC: 3.57 MIL/uL — ABNORMAL LOW (ref 3.87–5.11)
WBC: 7.8 10*3/uL (ref 4.0–10.5)

## 2010-10-26 LAB — PROTIME-INR: INR: 1.82 — ABNORMAL HIGH (ref 0.00–1.49)

## 2010-10-27 LAB — GLUCOSE, CAPILLARY
Glucose-Capillary: 120 mg/dL — ABNORMAL HIGH (ref 70–99)
Glucose-Capillary: 153 mg/dL — ABNORMAL HIGH (ref 70–99)
Glucose-Capillary: 139 mg/dL — ABNORMAL HIGH (ref 70–99)

## 2010-10-27 LAB — PROTIME-INR: Prothrombin Time: 19.1 seconds — ABNORMAL HIGH (ref 11.6–15.2)

## 2010-10-28 LAB — PROTIME-INR
INR: 2.26 — ABNORMAL HIGH (ref 0.00–1.49)
Prothrombin Time: 25.1 s — ABNORMAL HIGH (ref 11.6–15.2)

## 2010-10-28 LAB — GLUCOSE, CAPILLARY: Glucose-Capillary: 117 mg/dL — ABNORMAL HIGH (ref 70–99)

## 2010-10-29 LAB — GLUCOSE, CAPILLARY
Glucose-Capillary: 130 mg/dL — ABNORMAL HIGH (ref 70–99)
Glucose-Capillary: 111 mg/dL — ABNORMAL HIGH (ref 70–99)
Glucose-Capillary: 122 mg/dL — ABNORMAL HIGH (ref 70–99)

## 2010-10-29 LAB — PROTIME-INR: Prothrombin Time: 28 seconds — ABNORMAL HIGH (ref 11.6–15.2)

## 2010-10-30 LAB — PROTIME-INR: INR: 2.58 — ABNORMAL HIGH (ref 0.00–1.49)

## 2010-10-30 LAB — GLUCOSE, CAPILLARY: Glucose-Capillary: 134 mg/dL — ABNORMAL HIGH (ref 70–99)

## 2010-11-11 NOTE — Discharge Summary (Signed)
[name], [name]              ACCOUNT NO.:  [id number]  MEDICAL RECORD NO.:  [id number]           PATIENT TYPE:  I  LOCATION:  4155                         FACILITY:  [facility]  PHYSICIAN:  Erick Colace, M.D.DATE OF BIRTH:  23-May-1941  DATE OF ADMISSION:  10/20/2010 DATE OF DISCHARGE:  10/30/2010                              DISCHARGE SUMMARY   DISCHARGE DIAGNOSES: 1. Right femur fracture with intramedullary rodding and open reduction     and internal fixation on Oct 15, 2010. 2. Coumadin for deep vein thrombosis prophylaxis. 3. Pain management. 4. Anemia. 5. Depression. 6. Hypertension. 7. Diabetes mellitus. 8. Hyperlipidemia.  A 70 year old white female admitted on Oct 15, 2010, after a fall without loss of consciousness with acute right thigh pain.  X-rays revealed a right femur fracture.  Underwent intramedullary rodding with open reduction and internal fixation on Oct 15, 2010, per Dr. Shelle Iron. Touchdown weightbearing, Coumadin for deep vein thrombosis prophylaxis, postoperative anemia of 9.2 and monitored.  She was total assist for transfers as well as ambulation.  She was admitted for a comprehensive rehab program.  PAST MEDICAL HISTORY:  See discharge diagnoses.  No alcohol or tobacco.  ALLERGIES:  SULFA, NAPROXEN, and VOLTAREN.  SOCIAL HISTORY:  Married, husband can assist, but limited.  Daughter in area works.  One-level home, 3 steps to entry.  Functional history prior to admission was independent.  Functional status upon admission to rehab services was max assist bed mobility, +2 total assist transfers, total assist ambulate, max assist lower body activities of daily living.  MEDICATIONS PRIOR TO ADMISSION: 1. Lisinopril 40 mg daily. 2. Zoloft 50 mg daily. 3. Fish oil twice daily. 4. Lorazepam 3 times daily as needed. 5. Metoprolol 50 mg twice daily. 6. Norvasc 5 mg daily. 7. Prilosec 20 mg daily.  PHYSICAL EXAMINATION:  VITAL SIGNS:  Blood pressure  129/73, pulse 54, temperature 98.2, respirations 18. GENERAL:  This was an alert female in no acute distress, oriented x3. EXTREMITIES:  Deep tendon reflexes 2+.  She did have some right thigh edema.  Neurovascular sensation intact. HIPS:  Hip incision clean and dry with staples intact. LUNGS:  Clear to auscultation. CARDIAC:  Regular rate and rhythm. ABDOMEN:  Soft, nontender.  Good bowel sounds.  REHABILITATION HOSPITAL COURSE:  The patient was admitted to inpatient rehab services with therapies initiated on a 3-hour daily basis consisting of physical therapy, occupational therapy, and 24-hour rehabilitation nursing.  The following issues were addressed during the patient's rehabilitation stay.  Pertaining to [name]'s right femur fracture, she had undergone intramedullary nailing as well as open reduction and internal fixation on Oct 15, 2010.  She was touchdown weightbearing, neurovascular sensation intact.  She would follow up with Orthopedic Service, Dr. Shelle Iron.  She remained on Coumadin for deep vein thrombosis prophylaxis with latest INR of 2.26.  She will complete Coumadin protocol on November 15, 2010.  A Home Health nurse had been arranged for the necessary blood draws.  Pain management ongoing with the use of oxycodone scheduled as well as immediate release for breakthrough pain and Robaxin for muscle spasms with good pain control. Postoperative  anemia stable with latest hemoglobin of 10.2, hematocrit of 31.  She had no chest pain, shortness of breath, or orthostatic blood pressure changes.  She continued on Lopressor for blood pressure control.  She would follow up with her primary MD for ongoing medical management.  History of diabetes mellitus with hemoglobin A1c of 6.9 with diet control.  Latest blood sugars 132, 117, 125.  She would remain on her fish oil for history of hyperlipidemia.  The patient received weekly collaborative interdisciplinary team conferences to  discuss estimated length of stay, family teaching, and any barriers to discharge.  She was supervision overall for activities of daily living, supervision for all mobility with rolling walker and wheelchair.  She exhibited no unsafe behavior.  She was continent of bowel and bladder.  Latest labs showed an INR of 2.26, hemoglobin 10.2, hematocrit 31, platelet 336,000.  Sodium 134, potassium 3.7, BUN 9, creatinine 0.4.  Discharge medications at time of dictation included: 1. Coumadin latest dose of 5 mg daily to be completed on November 15, 2010,     for DVT prophylaxis. 2. Nu-Iron 150 mg daily. 3. Lopressor 50 mg twice daily. 4. Fish oil 1 tablet twice daily. 5. Zoloft 50 mg daily. 6. Prilosec 20 mg daily. 7. Oxycodone 5 mg 1-2 tablets every 4 hours as needed for pain. 8. Robaxin 500 mg every 6 hours as needed for muscle spasms, dispense     of 60 tablets. 9. OxyContin sustained release 20 mg daily at bedtime, dispense of 30     tablets.  DIET:  Diabetic diet.  SPECIAL INSTRUCTIONS:  Touchdown weightbearing to right lower extremity. Home Health nurse for Coumadin protocol to be completed on November 15, 2010. The patient should follow up with Dr. Shelle Iron, Orthopedic Services, call for appointment; Dr. Crawford Givens, medical management; Dr. Claudette Laws at the outpatient rehab center as needed.  Home Health therapies had been arranged as per rehab services.  The patient was advised no driving.     Mariam Dollar, P.A.   ______________________________ Erick Colace, [name]    DA/MEDQ  D:  10/29/2010  T:  10/29/2010  Job:  [id number]  cc:   Dwana Curd. Para March, [name] Jene Every, [name]  Electronically Signed by Mariam Dollar P.A. on 10/29/2010 07:03:04 AM Electronically Signed by Claudette Laws [name] on 11/11/2010 04:54:09 AM

## 2010-11-12 ENCOUNTER — Encounter: Payer: Self-pay | Admitting: Family Medicine

## 2010-11-12 NOTE — Discharge Summary (Signed)
[name], [name]              ACCOUNT NO.:  [id number]  MEDICAL RECORD NO.:  [id number]           PATIENT TYPE:  I  LOCATION:  1611                         FACILITY:  [facility]  PHYSICIAN:  Jene Every, [name]    DATE OF BIRTH:  Dec 07, 1940  DATE OF ADMISSION:  10/15/2010 DATE OF DISCHARGE:  10/20/2010                              DISCHARGE SUMMARY   ADMISSION DIAGNOSES:  Includes: 1. Right midshaft femur fracture. 2. Hypertension. 3. Hypercholesterolemia. 4. Gastroesophageal reflux disease. 5. Diet-controlled diabetes.  DISCHARGE DIAGNOSES: 1. Right midshaft femur fracture status post intramedullary nailing of     the right femur. 2. Hypertension. 3. Hypercholesterolemia. 4. Gastroesophageal reflux disease. 5. Diet-controlled diabetes.  HISTORY:  [name] is a pleasant 70 year old female who unfortunately sustained a fall and noted immediate deformity of the right femur.  She was brought via EMS to the emergency room and noted to have a displaced right midshaft femur fracture.  This was closed and reduced by Dr. Shelle [name] in the emergency room and she was placed in traction.  The patient was then admitted for clearance to be followed by IM nailing of the femur. The risks and benefits of the surgery were discussed with the patient and the family and they did wish to proceed.  PROCEDURE:  The patient was taken to the operating room and underwent intramedullary nailing of the right femur.  SURGEON:  Jene Every, [name]  ASSISTANT:  Natalie Schanz PA-C.  ANESTHESIA:  General.  COMPLICATIONS:  None.  CONSULTATIONS:  PT/OT, Care Management, [facility].  LABORATORY DATA:  Admission laboratories showed a white cell count of 11.2, hemoglobin 12.7, hematocrit 37.2.  This was monitored throughout the hospital course.  White cell count normalized at 8.1.  Hemoglobin remained fairly stable at the time of discharge to rehabilitation at 9.2, hematocrit 27.8.   Coagulation studies done preoperatively showed a PT of 13.3 and INR of 0.99.  The patient was placed on Coumadin postoperatively.  At the time of discharge she is therapeutic with INR of 2.04.  Routine chemistries done on admission showed a sodium of 133, potassium 3.4, glucose of 199, normal BUN and creatinine.  These were followed throughout the hospital course.  Sodium remained slightly decreased, at the time of discharge 134.  Potassium was supplement and at the time of discharge was 3.7.  Glucose normalized at 110.  BUN and creatinine had remained stable.  GFR was greater than 60.  Hemoglobin A1c was 6.9.  Medicine did run anemia studies as per chart.  Urinalysis preoperatively was negative.  Postoperative urinalysis was negative. Blood type is O negative.  Urine culture showed no growth.  RADIOLOGIC:  Preoperative chest x-ray showed moderate CHF. Postoperative films showed good alignment of the fracture.  HOSPITAL COURSE:  The patient was admitted by Dr. Shelle [name] for pain control.  She underwent a closed reduction in the emergency room.  She was then placed on Buck's traction to the floor.  Preoperative clearance was initiated.  The patient was then taken to the OR and underwent the above-stated procedure without difficulty.  She was then transferred to the PACU and then to  the orthopedic floor for continued postoperative care.  Postoperatively, the patient did fairly well.  She had had some pain control issues and medications were adjusted accordingly.  She was placed on Coumadin for DVT prophylaxis.  Medicine continued to follow along with [place] for any postoperative medical needs.  The patient was slow to progress with physical therapy.  Discharge planning was initiated and it was felt that she would benefit from inpatient rehabilitation stay. The patient did have some postoperative anemia.  Anemia studies were reviewed by the hospitalist, which showed [name]-deficiency anemia.   They did guaiac her stool.  The patient was started on [name] supplementation. They recommended outpatient followup for this.  Postoperative day #4, it was felt at this time that the patient was stable to be discharged to inpatient rehabilitation.  She was medically cleared.  She was afebrile. Vital signs were stable.  Incision was clean, dry and intact.  No evidence of hematoma.  Compartments were soft.  Deep pedal pulses were equal.  Hemoglobin was stable at 9.2, hematocrit 27.8.  She was therapeutic on her Coumadin with an INR of 2.0.  DISPOSITION:  The patient is stable to be discharged to rehabilitation.  FOLLOWUP:  She is to follow up with Dr. Shelle [name] approximately 14 days from her surgery or they can remove staples and apply Steri-Strips while at inpatient rehabilitation.  Obtain x-rays and call us for review.  WOUND CARE:  Change her dressing daily.  ACTIVITY:  She is to be partial weightbearing on the lower extremity. Ice and elevation.  MEDICATIONS:  Medications as per medication reconciliation sheet.  DIET:  As tolerated.  CONDITION ON DISCHARGE:  Stable.  FINAL DIAGNOSIS:  Status post intramedullary nailing of the right femur.     [name], P.A.   ______________________________ Jene Every, [name]    CS/MEDQ  D:  11/02/2010  T:  11/02/2010  Job:  [id number]  Electronically Signed by [name] P.A. on 11/03/2010 11:25:30 AM Electronically Signed by Jene Every [name] on 11/12/2010 09:41:53 AM

## 2010-11-17 NOTE — H&P (Signed)
[name], [name] NO.:  [id number]  MEDICAL RECORD NO.:  [id number]           PATIENT TYPE:  I  LOCATION:  4155                         FACILITY:  [facility]  PHYSICIAN:  Ranelle Oyster, M.D.DATE OF BIRTH:  1940-08-12  DATE OF ADMISSION:  10/20/2010 DATE OF DISCHARGE:                             HISTORY & PHYSICAL   PRIMARY CARE PHYSICIAN:  Dwana Curd. Para March, MD  CHIEF COMPLAINT:  Right hip pain.  HISTORY OF PRESENT ILLNESS:  This is a pleasant 70 year old white female admitted on Oct 15, 2010 after a fall without loss of consciousness.  She presented with the right hip pain.  She was found to have a right midshaft femur fracture.  She underwent IM rodding with ORIF on Oct 15, 2010 by Dr. Shelle Iron.  She is toe down weightbearing on the right lower extremity and is on Coumadin for DVT prophylaxis.  The patient is having difficulties in mobility and self-care.  Rehab was consulted by the Orthopedic Team and felt that she could benefit from an inpatient stay.  REVIEW OF SYSTEMS:  Notable for fair appetite.  Just complained of some reflux, depression.  She has had some right hip pain and the leg swelling.  Full 12-point review is in the written H and P.  PAST MEDICAL HISTORY: 1. Hypertension. 2. Diabetes. 3. GERD. 4. Increased lipids. 5. Negative alcohol or tobacco use. 6. She does have a history of hysterectomy. 7. Depression.  FAMILY HISTORY:  Noncontributory.  SOCIAL HISTORY:  The patient lives with husband.  Husband can assist but is limited.  She has 3 steps to enter.  Family can check in on the patient as needed.  ALLERGIES: 1. SULFA. 2. NAPROXEN. 3. VOLTAREN.  HOME MEDICATIONS:  Lisinopril, Zoloft, fish oil, lorazepam, metoprolol, Norvasc, and Prilosec.  PHYSICAL EXAMINATION:  VITAL SIGNS:  Blood pressure 98.2, pulse is 54, respiratory rate is 18, blood pressure 129/73, and she is saturating 97% on 2 L of oxygen. GENERAL:  The patient is  pleasant, alert, and oriented x3. HEENT:  Pupils equal, round, and reactive to light.  Ears, nose, and throat exam is unremarkable. CHEST:  Clear. HEART:  Regular rate and rhythm. ABDOMEN:  Soft and nontender. SKIN:  Generally intact except for the incisions in the right hip which are clean and well approximated with only minimal serosanguineous discharge.  Right lower extremity is notable for 2+ edema and some bruising over the thigh.  She is trace to 1+ at the right calf. NEUROLOGIC:  Cranial nerves II-XII are grossly intact.  Sensation is intact.  Judgment, orientation, memory, and mood are pleasant.  Strength in the upper extremities is grossly 5- to 5/5 proximal and distal. Lower extremity strength is 1/5 in the hip and quadriceps.  She is 3+- 4/5 tibia, anterior, and gastroc.  Left lower extremity, she is 3+-4/5 proximally and 4/5 distally.  POST ADMISSION PHYSICIAN EVALUATION: 1. Functional deficit secondary to right midshaft femur fracture with     severe angulation status post intramedullary rod.  The patient is     toe down weightbearing.  She is postoperative day #5. 2. The patient  is admitted to receive collaborative interdisciplinary     care between the physiatrist, rehab nursing staff, and therapy     team. 3. The patient's level of medical complexity and substantial therapy     needs in context of that medical necessity cannot be provided at a     lesser intensity of care. 4. The patient has experienced substantial functional loss from her     baseline.  Premorbidly, she was independent.  Most recently with     physical therapy, she is +2 total assist for bed mobility, sit to     stand, and transfers (50%).  The patient has transferred only at     this time and has not ambulated due to the pain.  Bed mobility, she     is +2 total assist 50% as well.  Judging by the patient's     diagnosis, physical exam, and functional history, she has potential     for functional  progress which will result in measurable gains while     in inpatient rehab.  These gains will be of substantial and     practical use upon discharge to home in facilitating mobility and     self-care.  Interim changes in medical status since preadmission     screening are detailed in the history of present illness as above. 5. Physiatrist will provide 24-hour management of medical needs as     well as oversight of therapy plan/treatment and provide guidance as     appropriate guarding interaction of the two.  Medical problem list     and plan are below. 6. 24-hour rehab nursing will assist in management of the patient's     skin care needs as well as bowel and bladder function, integration     of therapy, concepts, techniques, education, etc. 7. PT will assess and treat for lower extremity strength, range of     motion, functional mobility, weightbearing precautions, use of     adaptive equipment, in general gait with goals supervision to     occasional modified independent.  Pain will be a determining     factor. 8. OT will assess and treat for upper extremity use ADLs, adaptive     techniques, equipment, functional mobility, safety.  The patient     and family education with goals modified independent to occasional     setup/min assist. 9. Case Management and social worker will assess and treat for     psychosocial issues and discharge planning. 10.Team conference will be held weekly to assess progress towards     goals and to determine barriers at discharge. 11.The patient has demonstrated sufficient medical stability and     exercise capacity to tolerate at least 3 hours of therapy per day     at least 5 days per week. 12.Estimated length of stay is approximately 2 weeks plus depending on     pain tolerance.  Prognosis is good.  The patient is fairly     motivated.  MEDICAL PROBLEM LIST AND PLAN: 1. Deep venous thrombosis prophylaxis with Coumadin.  INR is     therapeutic today  at 2.04.  Follow for any bleeding signs or     symptoms.  The patient has had some bruising around the     injury/operative site. 2. Electrolytes:  The patient with mild hyponatremia at this point.     We will follow clinically with serial labs on rehab.  Ensure     appropriate  dietary intake as well at this point. 3. Acute blood loss anemia:  Most recent hemoglobin 9.2 and generally     stable.  Iron supplement on board.  We will encourage adequate     dietary intake as well. 4. Blood pressure control with Lopressor, Norvasc with normotensive     readings today. 5. Wound care:  Continue dry dressing for now.  Wounds are intact with     minimal drainage. 6. History of fever.  Recent urine culture negative as was chest x-     ray.  Encourage incentive spirometry for lungs.  We will try to     wean oxygen additionally going forward to off as she tolerates. 7. Pain control with p.r.n. Tylenol as well as Percocet.  Consider     scheduling medications if pain becomes an inhibiting factor.  We     want to avoid neuro sedating side effects. 8. Sleep and anxiety:  The patient takes Ativan 1-2 mg t.i.d. p.r.n.     at home.  The patient told me generally she used it 3 times a day.     We will try to minimize as possible.     Ranelle Oyster, [name]     ZTS/MEDQ  D:  10/20/2010  T:  10/20/2010  Job:  [id number]  cc:   Dwana Curd. Para March, [name]  Electronically Signed by Faith Rogue [name] on 11/17/2010 09:50:41 AM

## 2010-11-20 ENCOUNTER — Ambulatory Visit (INDEPENDENT_AMBULATORY_CARE_PROVIDER_SITE_OTHER): Payer: Medicare Other | Admitting: Family Medicine

## 2010-11-20 ENCOUNTER — Encounter: Payer: Self-pay | Admitting: Family Medicine

## 2010-11-20 ENCOUNTER — Telehealth: Payer: Self-pay | Admitting: Family Medicine

## 2010-11-20 ENCOUNTER — Encounter: Payer: Self-pay | Admitting: Gastroenterology

## 2010-11-20 DIAGNOSIS — I10 Essential (primary) hypertension: Secondary | ICD-10-CM

## 2010-11-20 DIAGNOSIS — M810 Age-related osteoporosis without current pathological fracture: Secondary | ICD-10-CM

## 2010-11-20 DIAGNOSIS — S7291XA Unspecified fracture of right femur, initial encounter for closed fracture: Secondary | ICD-10-CM | POA: Insufficient documentation

## 2010-11-20 DIAGNOSIS — D509 Iron deficiency anemia, unspecified: Secondary | ICD-10-CM | POA: Insufficient documentation

## 2010-11-20 DIAGNOSIS — E119 Type 2 diabetes mellitus without complications: Secondary | ICD-10-CM

## 2010-11-20 DIAGNOSIS — S7290XA Unspecified fracture of unspecified femur, initial encounter for closed fracture: Secondary | ICD-10-CM

## 2010-11-20 LAB — CBC WITH DIFFERENTIAL/PLATELET
Eosinophils Relative: 2.2 % (ref 0.0–5.0)
Lymphocytes Relative: 29.4 % (ref 12.0–46.0)
MCV: 89.4 fl (ref 78.0–100.0)
Monocytes Absolute: 0.6 10*3/uL (ref 0.1–1.0)
Neutrophils Relative %: 58.8 % (ref 43.0–77.0)
Platelets: 237 10*3/uL (ref 150.0–400.0)
RBC: 4.16 Mil/uL (ref 3.87–5.11)
WBC: 7.1 10*3/uL (ref 4.5–10.5)

## 2010-11-20 LAB — IBC PANEL
Iron: 78 ug/dL (ref 42–145)
Saturation Ratios: 24.1 % (ref 20.0–50.0)
Transferrin: 231.1 mg/dL (ref 212.0–360.0)

## 2010-11-20 MED ORDER — AMLODIPINE BESYLATE 5 MG PO TABS: 5.0000 mg | ORAL_TABLET | Freq: Every day | ORAL | Status: DC

## 2010-11-20 NOTE — Assessment & Plan Note (Addendum)
Refer to GI.  No FH of colon CA and prev IFOB negative.  See notes on labs.

## 2010-11-20 NOTE — Telephone Encounter (Signed)
Please call pt.  Here labs are fine.  Her prev IFOB from earlier this year was negative- she had a question about this today.  I would still follow up with GI.  She can stop the iron for now- her levels are okay.  Thanks.

## 2010-11-20 NOTE — Patient Instructions (Signed)
Take 1/2 of the metoprolol tabs twice a day and restart the amlodipine.  Don't take the lisinopril yet.   See Shirlee Limerick about your referral before your leave today. You can get your results through our phone system.  Follow the instructions on the blue card. I want to recheck your BP and talk about osteoporosis treatment options this fall.  OV.  Take care.

## 2010-11-20 NOTE — Progress Notes (Signed)
Fell and had R femur fracture.  Inpatient recs reviewed.  Was nailed by ortho and went through short term rehab.  Using walker now. Sister is helping out, more than her husband.  Pain is controlled pretty well.  Still with some soreness in upper and lower leg after doing her rehab exercises.    Iron def anemia.  I had sent IFOB home with patient, this was prev neg.  No FH colon cancer.  Due for recheck of labs. Will need fu with GI in the future.    DXA.   Pre had been followed by Dr. Arlyce Dice.  Was prev on fosamax.  Requesting records.   Needs meter for DM2.   HTN- no orthostasis.  Huston Foley noted today.  No CP, SOB, BLE edema.  Compliant with meds.  nad Using walker.  ncat Mmm rrr ctab Ext w/o pitting edema No pallor, skin wnl Incisions well healed

## 2010-11-22 NOTE — Assessment & Plan Note (Signed)
Per ortho.  

## 2010-11-22 NOTE — Assessment & Plan Note (Signed)
Will dec the BB, add back the CCB and follow BP.  She agrees.  D/w pt.

## 2010-11-22 NOTE — Assessment & Plan Note (Signed)
Requesting records from Dr. Arlyce Dice, will address this after the fx is more fully healed.  She agrees.

## 2010-11-22 NOTE — Assessment & Plan Note (Signed)
rx written for meter. Okay for outpatient fu.

## 2010-11-23 NOTE — Telephone Encounter (Signed)
Advised pt, she said she is going to discuss a colonoscopy with the GI doctor when she goes.

## 2010-12-07 ENCOUNTER — Encounter: Payer: Self-pay | Admitting: Family Medicine

## 2010-12-07 ENCOUNTER — Telehealth: Payer: Self-pay | Admitting: Family Medicine

## 2010-12-07 DIAGNOSIS — IMO0001 Reserved for inherently not codable concepts without codable children: Secondary | ICD-10-CM | POA: Insufficient documentation

## 2010-12-07 NOTE — Telephone Encounter (Signed)
Patient notified as instructed by telephone. 

## 2010-12-07 NOTE — Telephone Encounter (Signed)
Please call pt.  Tell her I got a copy of the 2009 dxa and we can talk about it at the next OV.  Thanks.

## 2010-12-22 ENCOUNTER — Other Ambulatory Visit: Payer: Self-pay | Admitting: *Deleted

## 2010-12-22 MED ORDER — LORAZEPAM 1 MG PO TABS: ORAL_TABLET | ORAL | Status: DC

## 2010-12-22 NOTE — Telephone Encounter (Signed)
Rx called in as directed.   

## 2010-12-22 NOTE — Telephone Encounter (Signed)
Please call in

## 2010-12-28 ENCOUNTER — Other Ambulatory Visit (INDEPENDENT_AMBULATORY_CARE_PROVIDER_SITE_OTHER): Payer: Medicare Other | Admitting: Family Medicine

## 2010-12-28 DIAGNOSIS — D500 Iron deficiency anemia secondary to blood loss (chronic): Secondary | ICD-10-CM

## 2010-12-28 DIAGNOSIS — E119 Type 2 diabetes mellitus without complications: Secondary | ICD-10-CM

## 2010-12-28 DIAGNOSIS — E7800 Pure hypercholesterolemia, unspecified: Secondary | ICD-10-CM

## 2010-12-28 LAB — CBC WITH DIFFERENTIAL/PLATELET
Basophils Absolute: 0.1 10*3/uL (ref 0.0–0.1)
Lymphocytes Relative: 32.2 % (ref 12.0–46.0)
Monocytes Relative: 6.5 % (ref 3.0–12.0)
Neutrophils Relative %: 58.2 % (ref 43.0–77.0)
Platelets: 219 10*3/uL (ref 150.0–400.0)
RDW: 15.2 % — ABNORMAL HIGH (ref 11.5–14.6)
WBC: 6.6 10*3/uL (ref 4.5–10.5)

## 2010-12-28 LAB — COMPREHENSIVE METABOLIC PANEL
ALT: 28 U/L (ref 0–35)
AST: 27 U/L (ref 0–37)
Albumin: 4.4 g/dL (ref 3.5–5.2)
Alkaline Phosphatase: 89 U/L (ref 39–117)
BUN: 9 mg/dL (ref 6–23)
Creatinine, Ser: 0.6 mg/dL (ref 0.4–1.2)
Potassium: 3.7 mEq/L (ref 3.5–5.1)

## 2010-12-28 LAB — IBC PANEL
Iron: 83 ug/dL (ref 42–145)
Saturation Ratios: 28.1 % (ref 20.0–50.0)
Transferrin: 210.8 mg/dL — ABNORMAL LOW (ref 212.0–360.0)

## 2010-12-28 LAB — LIPID PANEL
HDL: 31.3 mg/dL — ABNORMAL LOW (ref >39.00)
Total CHOL/HDL Ratio: 7
Triglycerides: 178 mg/dL — ABNORMAL HIGH (ref 0.0–149.0)

## 2010-12-28 LAB — HEMOGLOBIN A1C: Hgb A1c MFr Bld: 7 % — ABNORMAL HIGH (ref 4.6–6.5)

## 2010-12-28 LAB — LDL CHOLESTEROL, DIRECT: Direct LDL: 168.7 mg/dL

## 2010-12-28 NOTE — Progress Notes (Signed)
Addended by: Alvina Chou on: 12/28/2010 02:49 PM   Modules accepted: Orders

## 2011-01-01 ENCOUNTER — Ambulatory Visit (INDEPENDENT_AMBULATORY_CARE_PROVIDER_SITE_OTHER): Payer: Medicare Other | Admitting: Family Medicine

## 2011-01-01 ENCOUNTER — Encounter: Payer: Self-pay | Admitting: Family Medicine

## 2011-01-01 DIAGNOSIS — M810 Age-related osteoporosis without current pathological fracture: Secondary | ICD-10-CM

## 2011-01-01 DIAGNOSIS — D509 Iron deficiency anemia, unspecified: Secondary | ICD-10-CM

## 2011-01-01 DIAGNOSIS — S7291XA Unspecified fracture of right femur, initial encounter for closed fracture: Secondary | ICD-10-CM

## 2011-01-01 DIAGNOSIS — E119 Type 2 diabetes mellitus without complications: Secondary | ICD-10-CM

## 2011-01-01 DIAGNOSIS — S7290XA Unspecified fracture of unspecified femur, initial encounter for closed fracture: Secondary | ICD-10-CM

## 2011-01-01 MED ORDER — OXYCODONE HCL 5 MG PO CAPS: 5.0000 mg | ORAL_CAPSULE | ORAL | Status: DC | PRN

## 2011-01-01 NOTE — Progress Notes (Signed)
R thigh pain, improved after ORIF, transitioned to cane from walker.  Rare oxyocodone use, but needs refill.  Overall doing well with pain control.  Husband continues to drink.  She's worried about him.  We talked about this today.  She's had help from other family members.   R femur fx.  Prev fosamax use.  She was asking about this today.  See plan.    She's going to call GI about f/u.  Labs d/w pt today.   Diabetes:  Using medications without difficulties:no meds Hypoglycemic episodes:no Hyperglycemic episodes:no Feet problems:no Blood Sugars averaging: ~130 in AM  PMH and SH reviewed  Meds, vitals, and allergies reviewed.   ROS: See HPI.  Otherwise negative.    GEN: nad, alert and oriented HEENT: mucous membranes moist NECK: supple w/o LA CV: rrr. PULM: ctab, no inc wob ABD: soft, +bs EXT: no edema SKIN: no acute rash  Diabetic foot exam: Normal inspection No skin breakdown No calluses  Normal DP pulses Normal sensation to light touch and monofilament Nails normal

## 2011-01-01 NOTE — Patient Instructions (Signed)
I'll check your old records and notify you when I get more information. I would take vitamin D and calcium twice a day (it usually comes in a combo pill with ~500 of each). Take the pain meds as needed.   We can consider a bone density scan later on.   I want you to call about your appointment with GI.   Keep checking your sugar episodically.  I would recheck your labs in 6 months with a OV a few days after that.  Glad to see you today.

## 2011-01-03 ENCOUNTER — Encounter: Payer: Self-pay | Admitting: Family Medicine

## 2011-01-03 NOTE — Assessment & Plan Note (Signed)
She'll call GI. No blood in stool on IFOB earlier.  No blood in stool noted by patient.

## 2011-01-03 NOTE — Assessment & Plan Note (Signed)
No new meds, continue to monitor.  Hopefully she'll be able to work on exercise as he R leg improves.

## 2011-01-03 NOTE — Assessment & Plan Note (Signed)
Improving, continue cane use and prn oxycodone with sedation caution.

## 2011-01-03 NOTE — Assessment & Plan Note (Signed)
I talked to pt about this.  I don't have a specific start date on her fosamax.  I'll talk with ortho.  There is a possible risk of long bone fx with fosamax, but that I couldn't comment the current/recent events.  It appears that she was on treatment from at least as far back as 2005 through 2009.  At this point, she's had 5+ years of treatment and it wouldn't make sense to consider further treatment with any agent w/o another DXA.  We'll consider DXA for the fall. She agrees.

## 2011-01-04 ENCOUNTER — Telehealth: Payer: Self-pay | Admitting: Family Medicine

## 2011-01-04 NOTE — Telephone Encounter (Signed)
Patient notified as instructed by telephone. 

## 2011-01-04 NOTE — Telephone Encounter (Signed)
Please call pt.  I talked to Dr. Ermelinda Das clinic.  We'll check the DXA later this fall.  Have her call me when she is ready to go and I can put in the order then.  They have a note in her chart at the ortho clinic to follow up on her L thigh xrays, if she is having any pain.  Thanks.

## 2011-01-12 ENCOUNTER — Telehealth: Payer: Self-pay | Admitting: *Deleted

## 2011-01-12 NOTE — Telephone Encounter (Signed)
Patient wants to know if you ever had the chance to try and figure out when she started taking the fosamax. She also wants to know if you ever reviewed the bone density from 98 (I don't see it in her chart). She wants to compare the bone density from 98 and 2008.

## 2011-01-13 NOTE — Telephone Encounter (Signed)
I still don't have the 98 DXA.  I wasn't able to get the exact start date on her fosamax.

## 2011-01-13 NOTE — Telephone Encounter (Signed)
Left message for patient to return my call.

## 2011-01-14 NOTE — Telephone Encounter (Signed)
Patient notified

## 2011-01-15 ENCOUNTER — Ambulatory Visit: Payer: Medicare Other | Admitting: Gastroenterology

## 2011-03-09 ENCOUNTER — Ambulatory Visit: Payer: Medicare Other | Admitting: Family Medicine

## 2011-03-30 ENCOUNTER — Ambulatory Visit: Payer: Medicare Other

## 2011-03-30 ENCOUNTER — Encounter: Payer: Self-pay | Admitting: Family Medicine

## 2011-03-30 ENCOUNTER — Ambulatory Visit (INDEPENDENT_AMBULATORY_CARE_PROVIDER_SITE_OTHER): Payer: Medicare Other | Admitting: Family Medicine

## 2011-03-30 VITALS — BP 140/80 | HR 58 | Temp 98.1°F | Ht 61.5 in | Wt 152.8 lb

## 2011-03-30 DIAGNOSIS — Z23 Encounter for immunization: Secondary | ICD-10-CM

## 2011-03-30 DIAGNOSIS — M7989 Other specified soft tissue disorders: Secondary | ICD-10-CM

## 2011-03-30 NOTE — Progress Notes (Signed)
  Subjective:    Patient ID: [name], female    DOB: 04-17-1941, 70 y.o.   MRN: [id number]  HPI  L 4th digit, ring finger -- PIP swollen, removed Accident a little over a week ago, now swollen and ring cutting into swollen finger, painful  Review of Systems ROS: GEN: No acute illnesses, no fevers, chills. GI: No n/v/d, eating normally Pulm: No SOB Interactive and getting along well at home.  Otherwise, ROS is as per the HPI.     Objective:   Physical Exam   Physical Exam  Blood pressure 140/80, pulse 58, temperature 98.1 F (36.7 C), temperature source Oral, height 5' 1.5" (1.562 m), weight 152 lb 12.8 oz (69.31 kg), SpO2 98.00%.  GEN: WDWN, NAD, Non-toxic, A & O x 3 HEENT: Atraumatic, Normocephalic. Neck supple. No masses, No LAD. Ears and Nose: No external deformity. EXTR: No c/c/e NEURO Normal gait.  PSYCH: Normally interactive. Conversant. Not depressed or anxious appearing.  Calm demeanor.   Mild pain at L PIP on the 4th. Ring present. No break in skin.      Assessment & Plan:   1. Flu vaccine need  Flu vaccine greater than or equal to 3yo preservative free IM  2. Finger swelling       Ring removed without difficulty using ring cutter

## 2011-04-22 ENCOUNTER — Other Ambulatory Visit: Payer: Self-pay | Admitting: *Deleted

## 2011-04-22 MED ORDER — LORAZEPAM 1 MG PO TABS: ORAL_TABLET | ORAL | Status: DC

## 2011-04-22 NOTE — Telephone Encounter (Signed)
Faxed request from [facility], last filled 03/24/11.

## 2011-04-22 NOTE — Telephone Encounter (Signed)
Called to [facility] 

## 2011-04-22 NOTE — Telephone Encounter (Signed)
Please call in

## 2011-05-13 ENCOUNTER — Other Ambulatory Visit: Payer: Self-pay | Admitting: *Deleted

## 2011-05-13 MED ORDER — METOPROLOL TARTRATE 50 MG PO TABS: 25.0000 mg | ORAL_TABLET | Freq: Two times a day (BID) | ORAL | Status: DC

## 2011-05-13 MED ORDER — OMEPRAZOLE 20 MG PO CPDR: 20.0000 mg | DELAYED_RELEASE_CAPSULE | Freq: Every day | ORAL | Status: DC

## 2011-06-15 ENCOUNTER — Other Ambulatory Visit: Payer: Self-pay | Admitting: Family Medicine

## 2011-06-15 DIAGNOSIS — E119 Type 2 diabetes mellitus without complications: Secondary | ICD-10-CM

## 2011-06-17 ENCOUNTER — Other Ambulatory Visit (INDEPENDENT_AMBULATORY_CARE_PROVIDER_SITE_OTHER): Payer: Medicare Other

## 2011-06-17 ENCOUNTER — Other Ambulatory Visit: Payer: Self-pay | Admitting: Family Medicine

## 2011-06-17 DIAGNOSIS — E119 Type 2 diabetes mellitus without complications: Secondary | ICD-10-CM

## 2011-06-17 LAB — COMPREHENSIVE METABOLIC PANEL
ALT: 32 U/L (ref 0–35)
AST: 24 U/L (ref 0–37)
Albumin: 4.2 g/dL (ref 3.5–5.2)
Alkaline Phosphatase: 61 U/L (ref 39–117)
Calcium: 9.8 mg/dL (ref 8.4–10.5)
Chloride: 104 mEq/L (ref 96–112)
Potassium: 4.6 mEq/L (ref 3.5–5.1)
Sodium: 138 mEq/L (ref 135–145)
Total Protein: 7.8 g/dL (ref 6.0–8.3)

## 2011-06-17 LAB — LIPID PANEL
HDL: 29.7 mg/dL — ABNORMAL LOW (ref >39.00)
Total CHOL/HDL Ratio: 7
VLDL: 34.6 mg/dL (ref 0.0–40.0)

## 2011-06-17 LAB — LDL CHOLESTEROL, DIRECT: Direct LDL: 148.4 mg/dL

## 2011-06-17 LAB — MICROALBUMIN / CREATININE URINE RATIO: Microalb, Ur: 0.2 mg/dL (ref 0.0–1.9)

## 2011-07-09 ENCOUNTER — Ambulatory Visit: Payer: Medicare Other | Admitting: Family Medicine

## 2011-07-14 ENCOUNTER — Ambulatory Visit (INDEPENDENT_AMBULATORY_CARE_PROVIDER_SITE_OTHER): Payer: Medicare Other | Admitting: Family Medicine

## 2011-07-14 ENCOUNTER — Encounter: Payer: Self-pay | Admitting: Family Medicine

## 2011-07-14 DIAGNOSIS — M810 Age-related osteoporosis without current pathological fracture: Secondary | ICD-10-CM

## 2011-07-14 DIAGNOSIS — M25519 Pain in unspecified shoulder: Secondary | ICD-10-CM

## 2011-07-14 DIAGNOSIS — E119 Type 2 diabetes mellitus without complications: Secondary | ICD-10-CM

## 2011-07-14 DIAGNOSIS — E785 Hyperlipidemia, unspecified: Secondary | ICD-10-CM

## 2011-07-14 DIAGNOSIS — F411 Generalized anxiety disorder: Secondary | ICD-10-CM

## 2011-07-14 DIAGNOSIS — E559 Vitamin D deficiency, unspecified: Secondary | ICD-10-CM

## 2011-07-14 DIAGNOSIS — I10 Essential (primary) hypertension: Secondary | ICD-10-CM

## 2011-07-14 MED ORDER — VITAMIN D3 1.25 MG (50000 UT) PO CAPS: 1.0000 | ORAL_CAPSULE | ORAL | Status: DC

## 2011-07-14 MED ORDER — CYCLOBENZAPRINE HCL 10 MG PO TABS
5.0000 mg | ORAL_TABLET | Freq: Three times a day (TID) | ORAL | Status: AC | PRN
Start: 2011-07-14 — End: 2011-07-24

## 2011-07-14 NOTE — Assessment & Plan Note (Signed)
Labs d/w pt.  Continue as is.  A1c at goal.

## 2011-07-14 NOTE — Progress Notes (Signed)
She went to see a lawyer about a will yesterday. She continues to have anxiety related to husband's illness.  Some better with meds.  No ADE from meds.    Pain in shoulders, L>R, positional, reaching overhead.  Asking about flexeril for pain.  She had used prev with relief.  Pain is intermittent and going on for a while per patient.  She'll have and occ cough with sputum, the pain may be worse after coughing.   Diabetes:  Using medications without difficulties:no meds Hypoglycemic episodes:no Hyperglycemic episodes:no Feet problems:no  Low vit D.  D/w pt about repletion.  Will proceed. She had atypical bone fracture after bisphosphonate tx.   PMH and SH reviewed  Meds, vitals, and allergies reviewed.   ROS: See HPI.  Otherwise negative.    GEN: nad, alert and oriented HEENT: mucous membranes moist NECK: supple w/o LA CV: rrr. PULM: ctab, no inc wob ABD: soft, +bs EXT: no edema SKIN: no acute rash L trap isn't tender but that is where she has been having pain.  No cuff findings on exam, no arm drop, impingement, pain on int/ext rotation.

## 2011-07-14 NOTE — Assessment & Plan Note (Signed)
Cont with current meds.  Sx fairly well controlled.  Her husband's illness is likely the source of this. >25 min spent with face to face with patient, >50% counseling and/or coordinating care

## 2011-07-14 NOTE — Assessment & Plan Note (Signed)
Likely trap strain, intermittently symptomatic.  Use flexeril prn.  F/u prn.

## 2011-07-14 NOTE — Assessment & Plan Note (Signed)
D/w pt. Statin intolerant.  Continue to work on diet.

## 2011-07-14 NOTE — Patient Instructions (Addendum)
Please talk to ortho about your leg.  Your sugar looked pretty good.  I would recheck it in another 6 months.  Come see me after that.   Take the extra vitamin D once a week for 12 weeks.  We'll recheck it in 6 months.  Call about your mammogram.   Take the flexeril for the muscle pain.  Glad to see you.

## 2011-07-14 NOTE — Assessment & Plan Note (Signed)
Will replete vit D and recheck this summer.

## 2011-07-16 ENCOUNTER — Telehealth: Payer: Self-pay | Admitting: *Deleted

## 2011-07-22 ENCOUNTER — Other Ambulatory Visit: Payer: Self-pay | Admitting: Family Medicine

## 2011-07-22 DIAGNOSIS — Z1231 Encounter for screening mammogram for malignant neoplasm of breast: Secondary | ICD-10-CM

## 2011-07-23 ENCOUNTER — Other Ambulatory Visit: Payer: Self-pay | Admitting: *Deleted

## 2011-07-23 NOTE — Telephone Encounter (Signed)
Not on medication list okay to refill

## 2011-07-23 NOTE — Telephone Encounter (Signed)
Opened in error

## 2011-07-25 MED ORDER — POLYETHYLENE GLYCOL 3350 17 GM/SCOOP PO POWD: 17.0000 g | Freq: Two times a day (BID) | ORAL | Status: DC | PRN

## 2011-07-25 NOTE — Telephone Encounter (Signed)
Call and verify that the patient wants this and if so, then call in.  Thanks.

## 2011-07-26 ENCOUNTER — Ambulatory Visit
Admission: RE | Admit: 2011-07-26 | Discharge: 2011-07-26 | Disposition: A | Discharge status: Home / Self Care | Payer: Medicare Other | Source: Ambulatory Visit | Attending: Family Medicine | Admitting: Family Medicine

## 2011-07-26 ENCOUNTER — Other Ambulatory Visit: Payer: Self-pay | Admitting: *Deleted

## 2011-07-26 DIAGNOSIS — Z1231 Encounter for screening mammogram for malignant neoplasm of breast: Secondary | ICD-10-CM

## 2011-07-26 MED ORDER — POLYETHYLENE GLYCOL 3350 17 GM/SCOOP PO POWD: 17.0000 g | Freq: Two times a day (BID) | ORAL | Status: AC | PRN

## 2011-07-26 NOTE — Telephone Encounter (Signed)
Spoke with patient.  She said she had several refills from her previous MD and has not needed refills until now.

## 2011-07-27 ENCOUNTER — Other Ambulatory Visit: Payer: Self-pay | Admitting: Family Medicine

## 2011-07-27 DIAGNOSIS — N6459 Other signs and symptoms in breast: Secondary | ICD-10-CM

## 2011-08-09 ENCOUNTER — Ambulatory Visit
Admission: RE | Admit: 2011-08-09 | Discharge: 2011-08-09 | Disposition: A | Discharge status: Home / Self Care | Payer: Medicare Other | Source: Ambulatory Visit | Attending: Family Medicine | Admitting: Family Medicine

## 2011-08-09 ENCOUNTER — Telehealth: Payer: Self-pay | Admitting: Family Medicine

## 2011-08-09 ENCOUNTER — Other Ambulatory Visit: Payer: Self-pay | Admitting: Family Medicine

## 2011-08-09 DIAGNOSIS — N6459 Other signs and symptoms in breast: Secondary | ICD-10-CM

## 2011-08-09 NOTE — Telephone Encounter (Signed)
LMOVM of cell phone to return call.

## 2011-08-09 NOTE — Telephone Encounter (Signed)
Pt is calling about her Serpraline 100 mg Generic for Zoloft. Her pharmacy was telling her that the Dr. Gibson Ramp it.

## 2011-08-10 ENCOUNTER — Encounter: Payer: Self-pay | Admitting: Family Medicine

## 2011-08-10 ENCOUNTER — Other Ambulatory Visit: Payer: Self-pay | Admitting: Family Medicine

## 2011-08-10 DIAGNOSIS — C50912 Malignant neoplasm of unspecified site of left female breast: Secondary | ICD-10-CM

## 2011-08-10 DIAGNOSIS — N63 Unspecified lump in unspecified breast: Secondary | ICD-10-CM | POA: Insufficient documentation

## 2011-08-10 MED ORDER — SERTRALINE HCL 100 MG PO TABS: 100.0000 mg | ORAL_TABLET | Freq: Every day | ORAL | Status: DC

## 2011-08-10 NOTE — Telephone Encounter (Signed)
Patient advised that we have not denied her Rx.  Perhaps it was sent to the wrong location.  Refill sent to [facility].

## 2011-08-12 ENCOUNTER — Other Ambulatory Visit: Payer: Self-pay | Admitting: *Deleted

## 2011-08-12 ENCOUNTER — Telehealth: Payer: Self-pay | Admitting: *Deleted

## 2011-08-12 DIAGNOSIS — C50219 Malignant neoplasm of upper-inner quadrant of unspecified female breast: Secondary | ICD-10-CM

## 2011-08-12 DIAGNOSIS — C50212 Malignant neoplasm of upper-inner quadrant of left female breast: Secondary | ICD-10-CM | POA: Insufficient documentation

## 2011-08-12 NOTE — Telephone Encounter (Signed)
Confirmed [facility] for 08/18/11 at 1200 .  Instructions and contact information given.  

## 2011-08-13 ENCOUNTER — Telehealth: Payer: Self-pay | Admitting: Family Medicine

## 2011-08-13 ENCOUNTER — Ambulatory Visit
Admission: RE | Admit: 2011-08-13 | Discharge: 2011-08-13 | Disposition: A | Discharge status: Home / Self Care | Payer: Medicare Other | Source: Ambulatory Visit | Attending: Family Medicine | Admitting: Family Medicine

## 2011-08-13 DIAGNOSIS — C50912 Malignant neoplasm of unspecified site of left female breast: Secondary | ICD-10-CM

## 2011-08-13 MED ORDER — GADOBENATE DIMEGLUMINE 529 MG/ML IV SOLN
14.0000 mL | Freq: Once | INTRAVENOUS | Status: AC | PRN
  Administered 2011-08-13: 14 mL via INTRAVENOUS

## 2011-08-13 NOTE — Telephone Encounter (Signed)
I called to tell patient that I was thinking about her with recent dx.  LMOVM.  Will try next week.

## 2011-08-16 ENCOUNTER — Other Ambulatory Visit: Payer: Self-pay | Admitting: *Deleted

## 2011-08-17 ENCOUNTER — Other Ambulatory Visit: Payer: Self-pay | Admitting: *Deleted

## 2011-08-17 ENCOUNTER — Telehealth: Payer: Self-pay | Admitting: Family Medicine

## 2011-08-17 MED ORDER — LORAZEPAM 1 MG PO TABS: ORAL_TABLET | ORAL | Status: DC

## 2011-08-17 NOTE — Telephone Encounter (Signed)
Medication phoned to pharmacy.  

## 2011-08-17 NOTE — Telephone Encounter (Signed)
Patient [facility] and she would like to talk to you before that appt.

## 2011-08-17 NOTE — Telephone Encounter (Signed)
Please call in

## 2011-08-17 NOTE — Telephone Encounter (Signed)
I called pt.  She has the onc eval tomorrow.  I told her that I would be thinking about her.  I appreciate the help of all involved in caring for this patient.

## 2011-08-17 NOTE — Telephone Encounter (Signed)
I think I sent you this early this AM.  If so, please sign off on this when called in.. Thanks.

## 2011-08-18 ENCOUNTER — Ambulatory Visit (HOSPITAL_BASED_OUTPATIENT_CLINIC_OR_DEPARTMENT_OTHER): Payer: Medicare Other | Admitting: General Surgery

## 2011-08-18 ENCOUNTER — Ambulatory Visit
Admission: RE | Admit: 2011-08-18 | Discharge: 2011-08-18 | Disposition: A | Discharge status: Home / Self Care | Payer: Medicare Other | Source: Ambulatory Visit | Attending: Radiation Oncology | Admitting: Radiation Oncology

## 2011-08-18 ENCOUNTER — Encounter: Payer: Self-pay | Admitting: Oncology

## 2011-08-18 ENCOUNTER — Ambulatory Visit: Payer: Medicare Other

## 2011-08-18 ENCOUNTER — Ambulatory Visit: Payer: Medicare Other | Attending: General Surgery | Admitting: Physical Therapy

## 2011-08-18 ENCOUNTER — Encounter (INDEPENDENT_AMBULATORY_CARE_PROVIDER_SITE_OTHER): Payer: Self-pay | Admitting: General Surgery

## 2011-08-18 ENCOUNTER — Telehealth: Payer: Self-pay | Admitting: Oncology

## 2011-08-18 ENCOUNTER — Ambulatory Visit (HOSPITAL_BASED_OUTPATIENT_CLINIC_OR_DEPARTMENT_OTHER): Payer: Medicare Other | Admitting: Oncology

## 2011-08-18 ENCOUNTER — Other Ambulatory Visit (HOSPITAL_BASED_OUTPATIENT_CLINIC_OR_DEPARTMENT_OTHER): Payer: Medicare Other | Admitting: Lab

## 2011-08-18 VITALS — BP 178/74 | HR 56 | Temp 98.4°F | Ht 61.5 in | Wt 154.4 lb

## 2011-08-18 DIAGNOSIS — C50219 Malignant neoplasm of upper-inner quadrant of unspecified female breast: Secondary | ICD-10-CM

## 2011-08-18 DIAGNOSIS — M25619 Stiffness of unspecified shoulder, not elsewhere classified: Secondary | ICD-10-CM | POA: Insufficient documentation

## 2011-08-18 DIAGNOSIS — R293 Abnormal posture: Secondary | ICD-10-CM | POA: Insufficient documentation

## 2011-08-18 DIAGNOSIS — E559 Vitamin D deficiency, unspecified: Secondary | ICD-10-CM

## 2011-08-18 DIAGNOSIS — IMO0001 Reserved for inherently not codable concepts without codable children: Secondary | ICD-10-CM | POA: Insufficient documentation

## 2011-08-18 LAB — CBC WITH DIFFERENTIAL/PLATELET
EOS%: 2 % (ref 0.0–7.0)
MCH: 30.5 pg (ref 25.1–34.0)
MCHC: 34.4 g/dL (ref 31.5–36.0)
MCV: 88.7 fL (ref 79.5–101.0)
MONO%: 6.6 % (ref 0.0–14.0)
RBC: 4.8 10*6/uL (ref 3.70–5.45)
RDW: 13.2 % (ref 11.2–14.5)

## 2011-08-18 LAB — COMPREHENSIVE METABOLIC PANEL
AST: 33 U/L (ref 0–37)
Albumin: 3.9 g/dL (ref 3.5–5.2)
Alkaline Phosphatase: 66 U/L (ref 39–117)
Potassium: 3.9 mEq/L (ref 3.5–5.3)
Sodium: 134 mEq/L — ABNORMAL LOW (ref 135–145)
Total Protein: 7.9 g/dL (ref 6.0–8.3)

## 2011-08-18 MED ORDER — LETROZOLE 2.5 MG PO TABS: 2.5000 mg | ORAL_TABLET | Freq: Every day | ORAL | Status: AC

## 2011-08-18 NOTE — Progress Notes (Addendum)
[facility] Radiation Oncology NEW PATIENT EVALUATION  Name: [name] MRN: [id number]  Date: 08/18/2011  DOB: February 14, 1941  Status: outpatient   CC: Crawford Givens, MD, MD  Emelia Loron, MD, Dr. Pierce Crane   REFERRING PHYSICIAN: Emelia Loron, MD   DIAGNOSIS:  Clinical stage I (T1, N0, M0) invasive ductal carcinoma of the left breast   HISTORY OF PRESENT ILLNESS:  [name] is a 71 y.o. female who is seen today for evaluation of her clinical stage I (T1, N0, M0) invasive ductal carcinoma of the left breast. She states that she noted development of a left parasternal breast mass almost 1 year ago. She was referred to the breast Center where she had mammography and ultrasonography on 06/07/2012. On examination this is felt to have a 1.0 cm mass along the left parasternal region and 9:00 with slight retraction of the overlying skin. On ultrasound the mass measured 1.0 cm in greatest diameter. The deep aspect of the mass contacted the pectoralis major muscle. Spiculations appear to extend to the skin but there is no definite skin thickening. An ultrasound-guided core biopsy on 08/09/2011 was diagnostic for invasive ductal carcinoma which is your positive at 100% and PR positive at 70% and a proliferation marker, Ki-67 of 20%. Of note is that she has had inverted nipples most of her life. She remains quite active, although she did fracture her right femur after a fall in May 2012.   PREVIOUS RADIATION THERAPY: She states that she received 5 treatments of radiation to her right jaw for an abscessed tooth while she was in her 30s (over 40 years ago).   PAST MEDICAL HISTORY:  has a past medical history of Insomnia, unspecified; Hypertension; Hyperlipidemia; GERD (gastroesophageal reflux disease); Anxiety; Other abnormal glucose; Diabetes mellitus (2011); Femur fracture, right; and Osteoporosis, unspecified.     PAST SURGICAL HISTORY:  Past Surgical History    Procedure Date  . Bilateral oophorectomy 1987  . Abdominal hysterectomy 1983  . Tubal ligation 1975  . Hernia repair 1999  . Orif femur fracture   . Femur fracture surgery 2012     FAMILY HISTORY: family history includes Alcohol abuse in her father and Diabetes in her mother. Her father died at age 53 from suicide, her mother died from complications of diabetes mellitus/congestive heart failure at age 63.   SOCIAL HISTORY:  reports that she has never smoked. She does not have any smokeless tobacco history on file. She reports that she does not drink alcohol or use illicit drugs. Married, 4 children. She worked in a Insurance claims handler.   ALLERGIES: Bisphosphonates; Diclofenac potassium; Ezetimibe-simvastatin; Naproxen sodium; Simvastatin; and Sulfonamide derivatives   MEDICATIONS:  Current Outpatient Prescriptions  Medication Sig Dispense Refill  . amLODipine (NORVASC) 5 MG tablet Take 1 tablet (5 mg total) by mouth daily.  90 tablet  3  . Calcium Carbonate-Vitamin D (CALCIUM-VITAMIN D) 500-200 MG-UNIT per tablet Take 1 tablet by mouth 2 (two) times daily with a meal.      . Cholecalciferol (VITAMIN D3) 50000 UNITS CAPS Take 1 capsule by mouth once a week.  12 capsule  0  . cyclobenzaprine (FLEXERIL) 10 MG tablet Take 10 mg by mouth 3 (three) times daily as needed.      Marland Kitchen LORazepam (ATIVAN) 1 MG tablet 1 tablet by mouth 2-3 times a day as needed for anxiety and 1-2 tabs at night for sleep  150 tablet  1  . metoprolol (LOPRESSOR) 50 MG tablet Take 0.5 tablets (  25 mg total) by mouth 2 (two) times daily.  180 tablet  1  . Omega-3 Fatty Acids (FISH OIL) 1000 MG CAPS Take 1 capsule by mouth 2 (two) times daily.       Marland Kitchen omeprazole (PRILOSEC) 20 MG capsule Take 1 capsule (20 mg total) by mouth daily.  90 capsule  1  . sertraline (ZOLOFT) 100 MG tablet Take 1 tablet (100 mg total) by mouth daily.  90 tablet  3      REVIEW OF SYSTEMS:  Pertinent items are noted in HPI.    PHYSICAL EXAM: Alert  and oriented 71 year old white female appearing slightly younger than her stated age. Vital signs: BP 178/74 pulse 56, respiratory rate 20 temperature 98.4 Head and neck examination: Grossly unremarkable. Nodes: Without palpable cervical, supraclavicular, or axillary lymphadenopathy. Chest: Lungs clear. Back: Without spinal or CVA tenderness. Heart: Regular rate and rhythm.: There is a 1-1.5 cm partially fixed mass along the left parasternal region involving the medial aspect of the left breast at 9:00. There is no overlying skin erythema or ulceration. The left nipple is inverted. No other masses are appreciated. Right breast without masses or lesions right nipple is also inverted.. Abdomen: Without masses or organomegaly. Extremities: Without edema. Neurologic examination: Grossly nonfocal.      LABORATORY DATA:  Lab Results  Component Value Date   WBC 9.0 08/18/2011   HGB 14.6 08/18/2011   HCT 42.6 08/18/2011   MCV 88.7 08/18/2011   PLT 214 08/18/2011   Lab Results  Component Value Date   NA 134* 08/18/2011   K 3.9 08/18/2011   CL 98 08/18/2011   CO2 25 08/18/2011   Lab Results  Component Value Date   ALT 40* 08/18/2011   AST 33 08/18/2011   ALKPHOS 66 08/18/2011   BILITOT 0.4 08/18/2011      IMPRESSION: Stage IA (T1, N0, M0) invasive ductal carcinoma of the left breast. I explained to the patient and her family that her local treatment options include mastectomy with a sentinel lymph node biopsy or partial mastectomy with a sentinel lymph node biopsy followed by breast irradiation. Even with a mastectomy, I suspect that she would need postoperative radiation therapy because of location and difficulty obtaining a wide margin. We also discussed hypo-fractionated radiation therapy versus standard fractionation and also the possible need for deep inspiration/breath-hold technique for her left-sided breast cancer to avoid cardiac irradiation. We discussed the potential acute and late toxicities of radiation  therapy, the patient desires to proceed with breast preservation. Dr. Dwain Sarna feels that he can proceed with surgery, however she feels that she is borderline resectable then she could receive neoadjuvant anti-estrogen hormone therapy. She'll be offered anti-estrogen therapy following surgery and radiation therapy through Dr. Donnie Coffin.   PLAN: As discussed above   I spent 40 minutes minutes face to face with the patient and more than 50% of that time was spent in counseling and/or coordination of care.

## 2011-08-18 NOTE — Progress Notes (Signed)
Referral MD Dr Natalie [name]  Reason for Referral: Breast mass    Chief Complaint  [name] presents with  . Breast Cancer  : This is a pleasant 71 year old woman , from [place], who is referred for evaluation of a breast mass. This woman and has not had a mammogram since approximately 2009. Natalie [name] noted a mass in Natalie medial portion of Natalie left breast about a year ago so. Natalie [name] sought medical attention for this and was referred for a mammogram. Bilateral mammogram left breast ultrasound performed 08/09/2011 showed a firm at 71 cm mass Natalie parasternal left breast 1:00 position with retraction of Natalie overlying skin. No other abnormalities were seen. Ultrasound showed a hypoechoic irregular mass with with margins. This area was biopsied on Natalie same day and showed a invasive ductal cancer ER +100% PR +17% or less for Natalie next 20% HER-2 was nonamplified. This is felt to be grade 2. An MRI scan of both breasts performed on 08/13/2011 showed an irregular enhancing mass with spiculated margins in Natalie lower quadrant of Natalie left breast measuring 1.5 x 1.7 x 1.9 cm this was abutting Natalie pectoralis muscle enhancement was seen extending along Natalie pectoralis muscle.   HPI: Past Medical History  Diagnosis Date  . Insomnia, unspecified   . Hypertension   . Hyperlipidemia   . GERD (gastroesophageal reflux disease)   . Anxiety   . Other abnormal glucose   . Osteoporosis, unspecified     with prev fosamax treatment  . Diabetes mellitus 2011    type II, diet controlled  :  Past Surgical History  Procedure Date  . Bilateral oophorectomy 1987  . Abdominal hysterectomy 1983  . Tubal ligation 1975  . Orif femur fracture   . Hernia repair 1999    [facility]  :  Current outpatient prescriptions:amLODipine (NORVASC) 5 MG tablet, Take 1 tablet (5 mg total) by mouth daily., Disp: 90 tablet, Rfl: 3;  Calcium Carbonate-Vitamin D (CALCIUM-VITAMIN D) 500-200 MG-UNIT per tablet, Take 1 tablet by mouth 2 (two) times daily with  a meal., Disp: , Rfl: ;  Cholecalciferol (VITAMIN D3) 50000 UNITS CAPS, Take 1 capsule by mouth once a week., Disp: 12 capsule, Rfl: 0 cyclobenzaprine (FLEXERIL) 10 MG tablet, Take 10 mg by mouth 3 (three) times daily as needed., Disp: , Rfl: ;  LORazepam (ATIVAN) 1 MG tablet, 1 tablet by mouth 2-3 times a day as needed for anxiety and 1-2 tabs at night for sleep, Disp: 150 tablet, Rfl: 1;  metoprolol (LOPRESSOR) 50 MG tablet, Take 0.5 tablets (25 mg total) by mouth 2 (two) times daily., Disp: 180 tablet, Rfl: 1 Omega-3 Fatty Acids (FISH OIL) 1000 MG CAPS, Take 1 capsule by mouth 2 (two) times daily. , Disp: , Rfl: ;  omeprazole (PRILOSEC) 20 MG capsule, Take 1 capsule (20 mg total) by mouth daily., Disp: 90 capsule, Rfl: 1;  sertraline (ZOLOFT) 100 MG tablet, Take 1 tablet (100 mg total) by mouth daily., Disp: 90 tablet, Rfl: 3;  letrozole (FEMARA) 2.5 MG tablet, Take 1 tablet (2.5 mg total) by mouth daily., Disp: 30 tablet, Rfl: 3:    :  Allergies  Allergen Reactions  . Bisphosphonates     Unclear relation to meds, but pt had long bone fracture after treatment with fosamax  . Diclofenac Potassium   . Ezetimibe-Simvastatin   . Naproxen Sodium   . Simvastatin   . Sulfonamide Derivatives   :  Family History  Problem Relation Age of Onset  . Diabetes Mother   .  Alcohol abuse Father   :  History   Social History  . Marital Status: Married x 23 y (2nd marriage)    Spouse Name: N/A    Number of Children: 4 ; ages46, 70, 54 and 66 ; 4 grand children  . Years of Education: N/A   Occupational History  . Not on file.   Social History Main Topics  . Smoking status: Never Smoker   . Smokeless tobacco: Not on file  . Alcohol Use: No  . Drug Use: No  . Sexually Active:    Other Topics Concern  . Not on file   Social History Narrative  No other hx of breast cancer in family; 5 siblings   :  Reproductive History Menarche 13 Menopause 23; no hx of HRT  A comprehensive review of  systems was negative.  Exam:  @IPVITALS @ General appearance: alert, cooperative and appears stated age Eyes: conjunctivae/corneas clear. PERRL, EOM's intact. Fundi benign. Throat: lips, mucosa, and tongue normal; teeth and gums normal Resp: clear to auscultation bilaterally and normal percussion bilaterally Breasts: 2 cm area over lining sternum with skin retraction, area is minimally mobile.  Cardio: regular rate and rhythm, S1, S2 normal, no murmur, click, rub or gallop and normal apical impulse GI: soft, non-tender; bowel sounds normal; no masses,  no organomegaly Extremities: extremities normal, atraumatic, no cyanosis or edema Pulses: 2+ and symmetric Lymph nodes: Cervical, supraclavicular, and axillary nodes normal. Neurologic: Grossly normal   Basename 08/18/11 1221  WBC 9.0  HGB 14.6  HCT 42.6  PLT 214    Basename 08/18/11 1221  NA 134*  K 3.9  CL 98  CO2 25  GLUCOSE 131*  BUN 10  CREATININE 0.58  CALCIUM 10.4    Blood smear review: n/a  Pathology:as above  US Breast Left  08/09/2011  *RADIOLOGY REPORT*  Clinical Data:  Palpable mass parasternal left breast with associated skin indentation.  Natalie [name] reports that both nipples have been inverted for several years.  DIGITAL DIAGNOSTIC BILATERAL MAMMOGRAM WITH CAD AND LEFT BREAST ULTRASOUND:  Comparison:  01/05/2010, 03/04/2008, 11/15/2006.  Findings:  Metallic skin marker denotes site of palpable concern in Natalie far medial and posterior left breast.  There is a spiculated mass in this region, and Natalie overlying skin is indented.  There are scattered fibroglandular densities bilaterally.  No mass is identified in Natalie right breast.  No suspicious microcalcification is seen in either breast.  No distortion is seen on Natalie right. Both nipples are slightly inverted, left greater than right, and this appears mammographically stable dating back to at least 2008.  Mammographic images were processed with CAD.  On physical exam,  there is a firm, fixed approximately 1 cm mass in Natalie parasternal left breast at 9 o'clock position with retraction of Natalie overlying skin.  No mass is palpated in Natalie left axilla. Both nipples are inverted.  Ultrasound is performed, showing a markedly hypoechoic irregular mass with indistinct and angulated margins Natalie parasternal left breast nine o'clock position and area of palpable concern.  Natalie mass measures approximately 0.9 x 1.0 x 1.0 cm results in posterior acoustic shadowing.  Natalie deep aspect of Natalie mass contacts, and may involve, Natalie underlying pectoralis muscle.  Natalie superficial aspect of Natalie mass has spiculations that appear to extend to Natalie skin.  No definite skin thickening.  There is some vascular flow within Natalie mass.  No internal mammary chain or left axillary lymphadenopathy is seen on ultrasound.  IMPRESSION:  1.  1.0 cm palpable mass parasternal left breast is highly suspicious for malignancy.  Pectoralis muscle and or skin involvement by Natalie mass cannot be excluded.  Ultrasound-guided core needle biopsy is suggested.  This was discussed with Natalie [name] and her daughter who accompanies her today.  Natalie biopsy will be performed today and dictated separately. 2.  No evidence malignancy in Natalie right breast.  BI-RADS CATEGORY 5:  Highly suggestive of malignancy - appropriate action should be taken.  Original Report Authenticated By: Britta Mccreedy, [name]   Mr Breast Bilateral W Wo Contrast  08/13/2011  *RADIOLOGY REPORT*  Clinical Data: New diagnosis, left sided breast cancer.  BUN and creatinine were obtained on site at [facility] at [address] Results:  BUN 12 mg/dL,  Creatinine 0.7 mg/dL.  BILATERAL BREAST MRI WITH AND WITHOUT CONTRAST  Technique: Multiplanar, multisequence MR images of both breasts were obtained prior to and following Natalie intravenous administration of 14ml of Multihance.  Three dimensional images were evaluated at Natalie independent DynaCad workstation.  Comparison:   No prior MRI.  Mammogram dated 08/09/11.  Findings: Foci of nonspecific enhancement is seen bilaterally.  An irregular, enhancing mass with spiculated margins is seen in Natalie lower inner quadrant of Natalie left breast, posteriorly measuring 1.5 x 1.7 x 1.9 cm.  Natalie mass abuts Natalie pectoralis muscle and enhancement is seen extending medially along Natalie pectoralis muscle. Enhancement is also seen extending towards Natalie skin where there is overlying retraction and thickening.  A biopsy clip is seen in association with Natalie mass.  No other suspicious mass or enhancement is seen in either breast.  No internal mammary or axillary adenopathy is present.  Natalie heart is mildly enlarged.  There is a moderate sized hiatal hernia.  IMPRESSION: Known malignancy, left breast.  No MRI specific evidence of malignancy, right breast.  THREE-DIMENSIONAL MR IMAGE RENDERING ON INDEPENDENT WORKSTATION:  Three-dimensional MR images were rendered by post-processing of Natalie original MR data on an independent workstation.  Natalie three- dimensional MR images were interpreted, and findings were reported in Natalie accompanying complete MRI report for this study.  BI-RADS CATEGORY 6:  Known biopsy-proven malignancy - appropriate action should be taken.  Original Report Authenticated By: Hiram Gash, [name]   [place] Core Biopsy  08/10/2011  **ADDENDUM** CREATED: 08/10/2011 15:34:06  Histologic evaluation demonstrates invasive mammary carcinoma with in situ carcinoma present.  This is grade II.  Perineural invasion is identified.  This is concordant with Natalie imaging findings. Results were discussed with Natalie [name] by telephone at her request.  Natalie [name] reports no complications from Natalie procedure.  Natalie [name] is scheduled for breast MRI for 08/13/2011.  Natalie [name] is scheduled to be seen in [facility] on 08/18/2011.  Addended by:  Cain Saupe, [name] on 08/10/2011 15:34:06.  **END ADDENDUM** SIGNED BY: Cain Saupe, [name]     08/09/2011  *RADIOLOGY REPORT*  Clinical Data:  Palpable parasternal left breast mass, suspicious findings on mammogram and ultrasound.  Biopsy was recommended.  ULTRASOUND GUIDED CORE BIOPSY OF Natalie left BREAST  Comparison: Mammogram and ultrasound 08/09/2011  I met with Natalie [name] and we discussed Natalie procedure of ultrasound- guided biopsy, including benefits and alternatives.  We discussed Natalie high likelihood of a successful procedure. We discussed Natalie risks of Natalie procedure, including infection, bleeding, tissue injury, clip migration, and inadequate sampling.  Informed written consent was given.  Using sterile technique 2% lidocaine, ultrasound guidance and a 14 gauge automated biopsy device,  biopsy was performed of a 1.0 cm suspicious mass, using a medial to lateral approach. At Natalie conclusion of Natalie procedure, a ribbon-shaped tissue marker clip was deployed into Natalie biopsy cavity.  Follow up 2 view mammogram was performed and dictated separately.  IMPRESSION: Ultrasound guided biopsy of Natalie left breast.  No apparent complications.  Original Report Authenticated By: Daryl Eastern, [name]   Mm Digital Diagnostic Bilat  08/09/2011  *RADIOLOGY REPORT*  Clinical Data:  Palpable mass parasternal left breast with associated skin indentation.  Natalie [name] reports that both nipples have been inverted for several years.  DIGITAL DIAGNOSTIC BILATERAL MAMMOGRAM WITH CAD AND LEFT BREAST ULTRASOUND:  Comparison:  01/05/2010, 03/04/2008, 11/15/2006.  Findings:  Metallic skin marker denotes site of palpable concern in Natalie far medial and posterior left breast.  There is a spiculated mass in this region, and Natalie overlying skin is indented.  There are scattered fibroglandular densities bilaterally.  No mass is identified in Natalie right breast.  No suspicious microcalcification is seen in either breast.  No distortion is seen on Natalie right. Both nipples are slightly inverted, left greater than right, and this appears  mammographically stable dating back to at least 2008.  Mammographic images were processed with CAD.  On physical exam, there is a firm, fixed approximately 1 cm mass in Natalie parasternal left breast at 9 o'clock position with retraction of Natalie overlying skin.  No mass is palpated in Natalie left axilla. Both nipples are inverted.  Ultrasound is performed, showing a markedly hypoechoic irregular mass with indistinct and angulated margins Natalie parasternal left breast nine o'clock position and area of palpable concern.  Natalie mass measures approximately 0.9 x 1.0 x 1.0 cm results in posterior acoustic shadowing.  Natalie deep aspect of Natalie mass contacts, and may involve, Natalie underlying pectoralis muscle.  Natalie superficial aspect of Natalie mass has spiculations that appear to extend to Natalie skin.  No definite skin thickening.  There is some vascular flow within Natalie mass.  No internal mammary chain or left axillary lymphadenopathy is seen on ultrasound.  IMPRESSION:  1.  1.0 cm palpable mass parasternal left breast is highly suspicious for malignancy.  Pectoralis muscle and or skin involvement by Natalie mass cannot be excluded.  Ultrasound-guided core needle biopsy is suggested.  This was discussed with Natalie [name] and her daughter who accompanies her today.  Natalie biopsy will be performed today and dictated separately. 2.  No evidence malignancy in Natalie right breast.  BI-RADS CATEGORY 5:  Highly suggestive of malignancy - appropriate action should be taken.  Original Report Authenticated By: Britta Mccreedy, [name]   Mm Digital Diagnostic Unilat L  08/09/2011  *RADIOLOGY REPORT*  Clinical Data:  Ultrasound guided core needle biopsy was performed of a suspicious parasternal left breast mass.  DIGITAL DIAGNOSTIC LEFT MAMMOGRAM  Comparison:  Natalie mammogram and ultrasound of Natalie left breast 08/09/2011  Findings:  Films are performed following ultrasound guided biopsy of palpable left breast mass, parasternal position.  A ribbon shaped biopsy clip is  seen in Natalie mass, on Natalie spot MLO view.  IMPRESSION: Satisfactory position of ribbon shaped biopsy clip in Natalie left breast mass.  Original Report Authenticated By: Britta Mccreedy, [name]    Assessment and Plan: 71 year old woman with a history of locally advanced ER positive breast cancer overlying sternum. This area is somewhat fixed underlying fascia. Natalie [name] has been seen by both radiation oncology and surgery who both agree that this oh is in fact somewhat fixed.  I have discussed with Natalie [name] concept of neoadjuvant hormonal therapy in attempt to loosen  this area and allow for more complete surgery that would not result in involved margins. As Natalie [name] has not had a bone density test in some time will order one. Natalie [name] does have a history of osteoporosis. I will check a vitamin D level before Natalie [name] returns. I have prescribed for her letrozole electronically. I plan to see her in approximately 1 month's time.   Pierce Crane [name] FRCP C.  45 minutes was spent with this [name] and family at that time and [name]-related counseling

## 2011-08-18 NOTE — Telephone Encounter (Signed)
gv pt appt schedule for April and appts for bone density and pet scan.

## 2011-08-18 NOTE — Progress Notes (Signed)
Patient ID: [name], female   DOB: 05-13-1941, 71 y.o.   MRN: [id number]  Chief Complaint  Patient presents with  . Other    breast cancer    HPI [name] is a 71 y.o. female.  Referred by Dr. Sunnie Nielsen HPI 60 yof who has had a left medial breast mass very near her sternum for some time.  This was evaluated by a mammogram that shows a spiculated mass in the far medial and posterior left breast.  Ultrasound shows an irregular mass measuring about 1x1x1cm.  The deep aspect contacts and may involve the pectoralis major muscle.  The superficial aspect has spiculations that appear to extend to the skin.  No left axillary adenopathy on u/s.  U/S guided core biopsy with clip placement was performed.  She only complains of the presence of the mass and some tenderness after the biopsy.  An MR shows a 1.5x1.7x1.9 cm (which more closely goes along with her exam) irregular enhancing mass with spiculated margins.  This mass abuts the pectoralis on MRI and extends medially along the muscle.  There is also enhancement towards the skin where there is underlying retraction and thickening.  There are no other breast or nodal abnormalities.  She presents today to discuss her options.    Past Medical History  Diagnosis Date  . Insomnia, unspecified   . Hypertension   . Hyperlipidemia   . GERD (gastroesophageal reflux disease)   . Anxiety   . Other abnormal glucose   . Osteoporosis, unspecified     with prev fosamax treatment  . Diabetes mellitus 2011    type II, diet controlled    Past Surgical History  Procedure Date  . Bilateral oophorectomy 1987  . Abdominal hysterectomy 1983  . Tubal ligation 1975  . Orif femur fracture   . Hernia repair 1999    [facility]    Family History  Problem Relation Age of Onset  . Diabetes Mother   . Alcohol abuse Father     Social History History  Substance Use Topics  . Smoking status: Never Smoker   . Smokeless tobacco: Not on file  . Alcohol  Use: No    Allergies  Allergen Reactions  . Bisphosphonates     Unclear relation to meds, but pt had long bone fracture after treatment with fosamax  . Diclofenac Potassium   . Ezetimibe-Simvastatin   . Naproxen Sodium   . Simvastatin   . Sulfonamide Derivatives     Current Outpatient Prescriptions  Medication Sig Dispense Refill  . amLODipine (NORVASC) 5 MG tablet Take 1 tablet (5 mg total) by mouth daily.  90 tablet  3  . Calcium Carbonate-Vitamin D (CALCIUM-VITAMIN D) 500-200 MG-UNIT per tablet Take 1 tablet by mouth 2 (two) times daily with a meal.      . Cholecalciferol (VITAMIN D3) 50000 UNITS CAPS Take 1 capsule by mouth once a week.  12 capsule  0  . cyclobenzaprine (FLEXERIL) 10 MG tablet Take 10 mg by mouth 3 (three) times daily as needed.      Marland Kitchen letrozole (FEMARA) 2.5 MG tablet Take 1 tablet (2.5 mg total) by mouth daily.  30 tablet  3  . LORazepam (ATIVAN) 1 MG tablet 1 tablet by mouth 2-3 times a day as needed for anxiety and 1-2 tabs at night for sleep  150 tablet  1  . metoprolol (LOPRESSOR) 50 MG tablet Take 0.5 tablets (25 mg total) by mouth 2 (two) times daily.  180  tablet  1  . Omega-3 Fatty Acids (FISH OIL) 1000 MG CAPS Take 1 capsule by mouth 2 (two) times daily.       Marland Kitchen omeprazole (PRILOSEC) 20 MG capsule Take 1 capsule (20 mg total) by mouth daily.  90 capsule  1  . sertraline (ZOLOFT) 100 MG tablet Take 1 tablet (100 mg total) by mouth daily.  90 tablet  3    Review of Systems Review of Systems  Constitutional: Negative for fever, chills and unexpected weight change.  HENT: Positive for sinus pressure. Negative for hearing loss, congestion, sore throat, trouble swallowing and voice change.   Eyes: Negative for visual disturbance.  Respiratory: Positive for shortness of breath. Negative for cough and wheezing.   Cardiovascular: Negative for chest pain, palpitations and leg swelling.  Gastrointestinal: Negative for nausea, vomiting, abdominal pain, diarrhea,  constipation, blood in stool, abdominal distention and anal bleeding.  Genitourinary: Negative for hematuria, vaginal bleeding and difficulty urinating.  Musculoskeletal: Positive for back pain. Negative for arthralgias.  Skin: Negative for rash and wound.  Neurological: Negative for seizures, syncope and headaches.  Hematological: Negative for adenopathy. Does not bruise/bleed easily.  Psychiatric/Behavioral: Negative for confusion. The patient is nervous/anxious.     There were no vitals taken for this visit.  Physical Exam Physical Exam  Vitals reviewed. Constitutional: She is oriented to person, place, and time. She appears well-developed and well-nourished.  Neck: Neck supple.  Cardiovascular: Normal rate, regular rhythm and normal heart sounds.   Pulmonary/Chest: Effort normal and breath sounds normal. She has no wheezes. She has no rales. Right breast exhibits no inverted nipple, no mass, no nipple discharge, no skin change and no tenderness. Left breast exhibits mass and skin change. Left breast exhibits no inverted nipple, no nipple discharge and no tenderness. Breasts are symmetrical.    Abdominal: Soft. There is no hepatomegaly.  Lymphadenopathy:    She has no cervical adenopathy.    She has no axillary adenopathy.       Right: No supraclavicular adenopathy present.       Left: No supraclavicular adenopathy present.  Neurological: She is alert and oriented to person, place, and time.    Data Reviewed Pathology, radiology films reviewed.  Discussed at multidisciplinary conference  Assessment    Left breast cancer    Plan    Primary endocrine therapy followed by surgery This mass by size is amenable to lumpectomy.  I am concerned though with ability to get margins.  I don't think there is any downside to beginning with primary endocrine therapy to see if we can decrease the size and possibly make this easier to excise.  She very much would like to undergo a lumpectomy  which I do think will be possible along with a sentinel node.  I will plan on seeing her back in 3 months to assess response.   We discussed the staging and pathophysiology of breast cancer. We discussed all of the different options for treatment for breast cancer including surgery, chemotherapy, radiation therapy, Herceptin, and antiestrogen therapy.   We discussed a sentinel lymph node biopsy as she does not appear to having lymph node involvement right now. We discussed the performance of that with injection of radioactive tracer and blue dye. We discussed that she would have an incision underneath her axillary hairline. We discussed that there is a bout a 10-20% chance of having a positive node with a sentinel lymph node biopsy and we will await the permanent pathology to make  any other first further decisions in terms of her treatment. One of these options might be to return to the operating room to perform an axillary lymph node dissection. We discussed about a 1-2% risk lifetime of chronic shoulder pain as well as lymphedema associated with a sentinel lymph node biopsy.  We discussed the options for treatment of the breast cancer which included lumpectomy versus a mastectomy. We discussed the performance of the lumpectomy with a wire placement. We discussed a 10-20% chance of a positive margin requiring reexcision in the operating room. We also discussed that she may need radiation therapy or antiestrogen therapy or both if she undergoes lumpectomy. We discussed the mastectomy and the postoperative care for that as well. We discussed that there is no difference in her survival whether she undergoes lumpectomy with radiation therapy or antiestrogen therapy versus a mastectomy. There is a slight difference in the local recurrence rate being 3-5% with lumpectomy and about 1% with a mastectomy. We discussed the risks of operation including bleeding, infection, possible reoperation. She understands her  further therapy will be based on what her stages at the time of her operation.         Kymari Lollis 08/18/2011, 6:20 PM

## 2011-08-23 ENCOUNTER — Telehealth: Payer: Self-pay | Admitting: *Deleted

## 2011-08-23 NOTE — Telephone Encounter (Signed)
Left VM for pt to return call regarding BMDC from 3/6.

## 2011-08-24 ENCOUNTER — Telehealth: Payer: Self-pay | Admitting: *Deleted

## 2011-08-24 NOTE — Telephone Encounter (Signed)
Spoke with pt concerning [facility]C from 3/6.  Pt denies questions or concerns regarding dx or treatment care plan.  Confirmed Dexa scan, CT/PET scan, and f/u appts with Drs. Donnie Coffin and [place].  Encourage pt to call with needs.  Received verbal understanding.  Contact information given.

## 2011-08-25 ENCOUNTER — Ambulatory Visit
Admission: RE | Admit: 2011-08-25 | Discharge: 2011-08-25 | Disposition: A | Discharge status: Home / Self Care | Payer: Medicare Other | Source: Ambulatory Visit | Attending: Oncology | Admitting: Oncology

## 2011-08-25 DIAGNOSIS — E559 Vitamin D deficiency, unspecified: Secondary | ICD-10-CM

## 2011-08-25 DIAGNOSIS — C50219 Malignant neoplasm of upper-inner quadrant of unspecified female breast: Secondary | ICD-10-CM

## 2011-08-30 ENCOUNTER — Encounter (HOSPITAL_COMMUNITY)
Admission: RE | Admit: 2011-08-30 | Discharge: 2011-08-30 | Disposition: A | Discharge status: Home / Self Care | Payer: Medicare Other | Source: Ambulatory Visit | Attending: Oncology | Admitting: Oncology

## 2011-08-30 DIAGNOSIS — C50219 Malignant neoplasm of upper-inner quadrant of unspecified female breast: Secondary | ICD-10-CM | POA: Insufficient documentation

## 2011-08-30 DIAGNOSIS — E559 Vitamin D deficiency, unspecified: Secondary | ICD-10-CM | POA: Insufficient documentation

## 2011-08-30 MED ORDER — FLUDEOXYGLUCOSE F - 18 (FDG) INJECTION
16.8000 | Freq: Once | INTRAVENOUS | Status: AC | PRN
  Administered 2011-08-30: 16.8 via INTRAVENOUS

## 2011-09-06 ENCOUNTER — Encounter: Payer: Self-pay | Admitting: *Deleted

## 2011-09-06 NOTE — Progress Notes (Signed)
Mailed after appt letter to pt. 

## 2011-09-17 ENCOUNTER — Telehealth: Payer: Self-pay | Admitting: *Deleted

## 2011-09-17 ENCOUNTER — Other Ambulatory Visit: Payer: Self-pay | Admitting: Oncology

## 2011-09-17 ENCOUNTER — Ambulatory Visit (HOSPITAL_BASED_OUTPATIENT_CLINIC_OR_DEPARTMENT_OTHER): Payer: Medicare Other | Admitting: Oncology

## 2011-09-17 ENCOUNTER — Other Ambulatory Visit (HOSPITAL_BASED_OUTPATIENT_CLINIC_OR_DEPARTMENT_OTHER): Payer: Medicare Other

## 2011-09-17 VITALS — BP 176/75 | HR 54 | Temp 98.4°F | Ht 61.5 in | Wt 156.3 lb

## 2011-09-17 DIAGNOSIS — C50219 Malignant neoplasm of upper-inner quadrant of unspecified female breast: Secondary | ICD-10-CM

## 2011-09-17 DIAGNOSIS — E559 Vitamin D deficiency, unspecified: Secondary | ICD-10-CM

## 2011-09-17 DIAGNOSIS — C50919 Malignant neoplasm of unspecified site of unspecified female breast: Secondary | ICD-10-CM

## 2011-09-17 DIAGNOSIS — Z171 Estrogen receptor negative status [ER-]: Secondary | ICD-10-CM

## 2011-09-17 DIAGNOSIS — N63 Unspecified lump in unspecified breast: Secondary | ICD-10-CM

## 2011-09-17 NOTE — Progress Notes (Signed)
Hematology and Oncology Follow Up Visit  [name] [id number] 1941/02/26 70 y.o. 09/17/2011 2:13 PM PCP Dr Dwain Sarna Principle Diagnosis: locally advanced breast cancer, on femara x 1 month.  Interim History:  There have been no intercurrent illness, hospitalizations or medication changes. Tolerating femara, no hot flashes or joint pains  Medications: I have reviewed the patient's current medications.  Allergies:  Allergies  Allergen Reactions  . Bisphosphonates     Unclear relation to meds, but pt had long bone fracture after treatment with fosamax  . Diclofenac Potassium   . Ezetimibe-Simvastatin   . Naproxen Sodium   . Simvastatin   . Sulfonamide Derivatives     Past Medical History, Surgical history, Social history, and Family History were reviewed and updated.  Review of Systems: Constitutional:  Negative for fever, chills, night sweats, anorexia, weight loss, pain. Cardiovascular: negative Respiratory: negative Neurological: negative Dermatological: negative ENT: negative Skin Gastrointestinal: no abdominal pain, change in bowel habits, or black or bloody stools Genito-Urinary: negative Hematological and Lymphatic: negative Breast: negative Musculoskeletal: negative Remaining ROS negative.  Physical Exam: Blood pressure 176/75, pulse 54, temperature 98.4 F (36.9 C), temperature source Oral, height 5' 1.5" (1.562 m), weight 156 lb 4.8 oz (70.897 kg). ECOG: 0 General appearance: alert, cooperative and appears stated age Head: Normocephalic, without obvious abnormality, atraumatic Neck: no adenopathy, no carotid bruit, no JVD, supple, symmetrical, trachea midline and thyroid not enlarged, symmetric, no tenderness/mass/nodules Lymph nodes: Cervical, supraclavicular, and axillary nodes normal. Cardiac : regular rate and rhythm, no murmurs or gallops Pulmonary:clear to auscultation bilaterally and normal percussion bilaterally Breasts: inspection negative, no  nipple discharge or bleeding, no masses or nodularity palpable, lt breast mass, is flatter more mobile, still has skin indentation Abdomen:normal findings: bowel sounds normal Extremities negative Neuro: alert, oriented, normal speech, no focal findings or movement disorder noted  Lab Results: Lab Results  Component Value Date   WBC 9.0 08/18/2011   HGB 14.6 08/18/2011   HCT 42.6 08/18/2011   MCV 88.7 08/18/2011   PLT 214 08/18/2011     Chemistry      Component Value Date/Time   NA 134* 08/18/2011 1221   K 3.9 08/18/2011 1221   CL 98 08/18/2011 1221   CO2 25 08/18/2011 1221   BUN 10 08/18/2011 1221   CREATININE 0.58 08/18/2011 1221      Component Value Date/Time   CALCIUM 10.4 08/18/2011 1221   ALKPHOS 66 08/18/2011 1221   AST 33 08/18/2011 1221   ALT 40* 08/18/2011 1221   BILITOT 0.4 08/18/2011 1221      .pathology. Radiological Studies: chest X-ray n/a Mammogram n/a Bone density n/a  Impression and Plan: Pt is doing well, she has had her PET scan which shows the breast lesion and questionable area in adrenal gland. No other obvious evidence of mets. I think she is responding to the femara and I will see her in 2 months with f/u u/s. I have recommended prolia given her bone density results, we will obtain pre-cert and i have given her information about this.  More than 50% of the visit was spent in patient-related counselling   Pierce Crane, MD 4/5/20132:13 PM

## 2011-09-17 NOTE — Telephone Encounter (Signed)
gave patient appointment for ultrasound of the left breast and mammogram on 11-05-2011 at 1:00 at the breast center and 12-2011 for lab and md printed out calendar and gave to the patient

## 2011-09-20 LAB — VITAMIN D 25 HYDROXY (VIT D DEFICIENCY, FRACTURES): Vit D, 25-Hydroxy: 35 ng/mL (ref 30–89)

## 2011-10-05 ENCOUNTER — Encounter: Payer: Self-pay | Admitting: Family Medicine

## 2011-10-05 ENCOUNTER — Ambulatory Visit (INDEPENDENT_AMBULATORY_CARE_PROVIDER_SITE_OTHER): Payer: Medicare Other | Admitting: Family Medicine

## 2011-10-05 VITALS — BP 136/80 | HR 54 | Temp 98.6°F | Wt 156.8 lb

## 2011-10-05 DIAGNOSIS — M810 Age-related osteoporosis without current pathological fracture: Secondary | ICD-10-CM

## 2011-10-05 DIAGNOSIS — C50219 Malignant neoplasm of upper-inner quadrant of unspecified female breast: Secondary | ICD-10-CM

## 2011-10-05 MED ORDER — OYSTER CALCIUM 500 MG PO TABS: 500.0000 mg | ORAL_TABLET | Freq: Two times a day (BID) | ORAL | Status: DC

## 2011-10-05 MED ORDER — CHOLECALCIFEROL 25 MCG (1000 UT) PO CAPS: 1000.0000 [IU] | ORAL_CAPSULE | Freq: Every day | ORAL | Status: AC

## 2011-10-05 NOTE — Progress Notes (Signed)
Her husband is doing much better at home, his memory is improved as he has gone a few months without drinking. This is first such period in years.    H/o osteoporosis and had been rec'd Prolia by Dr. Donnie Coffin.  Vit D was okay at 75.  H/o atypical femur fracture.  Had seen Dr. Shelle Iron with ortho.  Her right leg is healed and she's walking w/o a cane or walker.  No falls.  She shouldn't take fosamax at this point.  She doesn't want to take prolia, due to concerns about her leg fracture prev.   She's had f/u at the cancer center.  On femara.  Appears to be doing well on this.  She has not yet had surgery.    Meds, vitals, and allergies reviewed.   ROS: See HPI.  Otherwise, noncontributory.  Nad ncat Mmm rrr ctab Ext w/o edema

## 2011-10-05 NOTE — Patient Instructions (Signed)
I would take 500 of calcium twice a day and 1000 of vit D once a day.  I'll be in touch in the meantime.  Take care.

## 2011-10-08 ENCOUNTER — Other Ambulatory Visit: Payer: Self-pay

## 2011-10-08 ENCOUNTER — Encounter: Payer: Self-pay | Admitting: Family Medicine

## 2011-10-08 MED ORDER — OMEPRAZOLE 20 MG PO CPDR: 20.0000 mg | DELAYED_RELEASE_CAPSULE | Freq: Every day | ORAL | Status: DC

## 2011-10-08 NOTE — Assessment & Plan Note (Signed)
Per surgery/onc

## 2011-10-08 NOTE — Assessment & Plan Note (Signed)
She's not at all interested in prolia (and should not go back on fosamax) since she had an atypical long bone fracture. She doesn't want injection medicine, is really hesitant to start other meds.  I don't know of good options other than just calcium and vit D.  I think that is all she is interested in.  I will check with ortho and heme/onc, but that may be the best option.  >25 min spent with face to face with patient, >50% counseling and/or coordinating care

## 2011-10-08 NOTE — Telephone Encounter (Signed)
Pt request refill Omeprazole 20 mg #90 x 3. Pt last seen 10/05/11.

## 2011-10-10 ENCOUNTER — Telehealth: Payer: Self-pay | Admitting: Family Medicine

## 2011-10-10 NOTE — Telephone Encounter (Signed)
Notify pt.  Note returned from Dr. Donnie Coffin.  He agreed with not starting prolia.  He would continue vit D and calcium as we had discussed.  Thanks.

## 2011-10-11 NOTE — Telephone Encounter (Signed)
LMOVM to return call.

## 2011-10-11 NOTE — Telephone Encounter (Signed)
Patient advised.

## 2011-10-13 ENCOUNTER — Other Ambulatory Visit: Payer: Self-pay | Admitting: *Deleted

## 2011-10-13 MED ORDER — LORAZEPAM 1 MG PO TABS: ORAL_TABLET | ORAL | Status: DC

## 2011-10-13 NOTE — Telephone Encounter (Signed)
Please call in.  Thanks.   

## 2011-10-13 NOTE — Telephone Encounter (Signed)
Rx called to pharmacy as instructed. 

## 2011-10-13 NOTE — Telephone Encounter (Signed)
Received faxed refill request from pharmacy. Is it okay to refill medication? 

## 2011-11-05 ENCOUNTER — Ambulatory Visit
Admission: RE | Admit: 2011-11-05 | Discharge: 2011-11-05 | Disposition: A | Discharge status: Home / Self Care | Payer: Medicare Other | Source: Ambulatory Visit | Attending: Oncology | Admitting: Oncology

## 2011-11-05 DIAGNOSIS — N63 Unspecified lump in unspecified breast: Secondary | ICD-10-CM

## 2011-11-05 DIAGNOSIS — C50219 Malignant neoplasm of upper-inner quadrant of unspecified female breast: Secondary | ICD-10-CM

## 2011-11-12 ENCOUNTER — Encounter (INDEPENDENT_AMBULATORY_CARE_PROVIDER_SITE_OTHER): Payer: Self-pay | Admitting: General Surgery

## 2011-11-12 ENCOUNTER — Ambulatory Visit (INDEPENDENT_AMBULATORY_CARE_PROVIDER_SITE_OTHER): Payer: Medicare Other | Admitting: General Surgery

## 2011-11-12 VITALS — BP 144/86 | HR 64 | Temp 97.7°F | Resp 14 | Ht 61.5 in | Wt 157.2 lb

## 2011-11-12 DIAGNOSIS — C50219 Malignant neoplasm of upper-inner quadrant of unspecified female breast: Secondary | ICD-10-CM

## 2011-11-12 NOTE — Progress Notes (Signed)
Subjective:     Patient ID: [name], female   DOB: October 12, 1940, 71 y.o.   MRN: [id number]  HPI 24 yof who was previously seen in March for newly diagnosed left breast cancer with concerning features on mr and clinically.  This was fixed at the time.  She was placed on primary antiestrogen therapy and has been on this for past three months. She reports this area is mobile now and she thinks it may be less in size.  She report no other real complaints and is tolerating letrozole pretty well.  She returns today after another u/s for follow up.  Review of Systems *RADIOLOGY REPORT*  Clinical Data: The patient is undergoing neoadjuvant hormonal  therapy for known left breast cancer. The patient is taking  femara. Biopsy was performed 08/09/2011.  DIGITAL DIAGNOSTIC LEFT MAMMOGRAM CAD AND LEFT BREAST ULTRASOUND:  Comparison: 08/09/2011 and earlier  Findings: Breast parenchyma is scattered fibroglandular. Within  the medial portion of the left breast, there is a partially imaged  spiculated mass, marked with a ribbon shaped clip.  Mammographic images were processed with CAD.  On physical exam, I palpate a soft mass along the sternal margin.  This is associated with indentation when the patient sits up.  Ultrasound is performed, showing a spiculated hypoechoic mass in  the 9 o'clock location of the left breast in the area of skin  indentation. Mass measures 1.3 x 1.5 x 1.0 cm. Previously, the  mass measured 1.0 x 0.9 x 1.0 cm. Mass is vascular when evaluated  on color Doppler evaluation.  IMPRESSION:  1. Persistent soft tissue mass in the area of known malignancy.  2. Mass measures slightly larger compared to the prior study. The  patient is scheduled for evaluation by Dr. Dwain Sarna prior to  surgery.      Objective:   Physical Exam  Pulmonary/Chest: Left breast exhibits mass.         Assessment:     Left breast cancer on primary antiestrogen therapy    Plan:     This is  mobile now and I think will be amenable to attempt at lumpectomy when we discuss surgery.  I don't know what to make of the u/s as this is not really best way to follow now.  I think best plan will be to continue three more months of letrozole.  I will have her get mr at that time and return to see me with plan to proceed with surgery at the end of that three months.

## 2011-11-15 ENCOUNTER — Encounter (INDEPENDENT_AMBULATORY_CARE_PROVIDER_SITE_OTHER): Payer: Self-pay | Admitting: General Surgery

## 2011-11-29 ENCOUNTER — Other Ambulatory Visit: Payer: Self-pay | Admitting: *Deleted

## 2011-11-29 MED ORDER — AMLODIPINE BESYLATE 5 MG PO TABS: 5.0000 mg | ORAL_TABLET | Freq: Every day | ORAL | Status: DC

## 2011-12-10 ENCOUNTER — Other Ambulatory Visit: Payer: Self-pay | Admitting: *Deleted

## 2011-12-10 NOTE — Telephone Encounter (Signed)
Faxed refill request   

## 2011-12-11 ENCOUNTER — Other Ambulatory Visit: Payer: Self-pay | Admitting: Oncology

## 2011-12-11 DIAGNOSIS — C50219 Malignant neoplasm of upper-inner quadrant of unspecified female breast: Secondary | ICD-10-CM

## 2011-12-12 MED ORDER — LORAZEPAM 1 MG PO TABS: ORAL_TABLET | ORAL | Status: DC

## 2011-12-12 NOTE — Telephone Encounter (Signed)
Call in 

## 2011-12-13 NOTE — Telephone Encounter (Signed)
Medication phoned to pharmacy.  

## 2011-12-20 ENCOUNTER — Telehealth: Payer: Self-pay | Admitting: Family Medicine

## 2011-12-20 NOTE — Telephone Encounter (Signed)
She needs a handicap parking sticker.  This is reasonable.  Form done.  Please send to patient.

## 2011-12-20 NOTE — Telephone Encounter (Signed)
Pt is wanting a call back about a handicap sticker. Pt got a ticket because she had a sticker under her husbands name but they are telling her to come and get a sticker from you. She just wants a call back about some questions regarding this.   Please call [phone] if you call before 5 and after 5 please call home number

## 2011-12-21 NOTE — Telephone Encounter (Signed)
Patient notified that form is ready. Patient stated that she will pick it up or have someone pick it up for her.

## 2011-12-31 ENCOUNTER — Other Ambulatory Visit: Payer: Self-pay | Admitting: *Deleted

## 2011-12-31 ENCOUNTER — Other Ambulatory Visit (HOSPITAL_BASED_OUTPATIENT_CLINIC_OR_DEPARTMENT_OTHER): Payer: Medicare Other | Admitting: Lab

## 2011-12-31 DIAGNOSIS — C50219 Malignant neoplasm of upper-inner quadrant of unspecified female breast: Secondary | ICD-10-CM

## 2011-12-31 DIAGNOSIS — E559 Vitamin D deficiency, unspecified: Secondary | ICD-10-CM

## 2011-12-31 DIAGNOSIS — C50919 Malignant neoplasm of unspecified site of unspecified female breast: Secondary | ICD-10-CM

## 2011-12-31 LAB — CBC WITH DIFFERENTIAL/PLATELET
BASO%: 0.6 % (ref 0.0–2.0)
Basophils Absolute: 0 10*3/uL (ref 0.0–0.1)
EOS%: 3 % (ref 0.0–7.0)
Eosinophils Absolute: 0.2 10*3/uL (ref 0.0–0.5)
HCT: 39.3 % (ref 34.8–46.6)
HGB: 14 g/dL (ref 11.6–15.9)
LYMPH%: 35.5 % (ref 14.0–49.7)
MCH: 30.8 pg (ref 25.1–34.0)
MCHC: 35.6 g/dL (ref 31.5–36.0)
MCV: 86.6 fL (ref 79.5–101.0)
MONO#: 0.5 10*3/uL (ref 0.1–0.9)
MONO%: 7.1 % (ref 0.0–14.0)
NEUT#: 3.7 10*3/uL (ref 1.5–6.5)
NEUT%: 53.8 % (ref 38.4–76.8)
Platelets: 203 10*3/uL (ref 145–400)
RBC: 4.54 10*6/uL (ref 3.70–5.45)
RDW: 13.2 % (ref 11.2–14.5)
WBC: 6.8 10*3/uL (ref 3.9–10.3)
lymph#: 2.4 10*3/uL (ref 0.9–3.3)

## 2012-01-01 LAB — CANCER ANTIGEN 27.29: CA 27.29: 24 U/mL (ref 0–39)

## 2012-01-01 LAB — COMPREHENSIVE METABOLIC PANEL
Alkaline Phosphatase: 59 U/L (ref 39–117)
BUN: 8 mg/dL (ref 6–23)
Glucose, Bld: 148 mg/dL — ABNORMAL HIGH (ref 70–99)
Total Bilirubin: 0.4 mg/dL (ref 0.3–1.2)

## 2012-01-07 ENCOUNTER — Ambulatory Visit (HOSPITAL_BASED_OUTPATIENT_CLINIC_OR_DEPARTMENT_OTHER): Payer: Medicare Other | Admitting: Oncology

## 2012-01-07 VITALS — BP 162/76 | HR 57 | Temp 98.7°F | Ht 61.5 in | Wt 157.0 lb

## 2012-01-07 DIAGNOSIS — C50219 Malignant neoplasm of upper-inner quadrant of unspecified female breast: Secondary | ICD-10-CM

## 2012-01-07 NOTE — Patient Instructions (Addendum)
PLEASE TAKE PERIDIN C 1-2 pills a few times a day. It is available over-the-counter at the pharmacy.

## 2012-01-07 NOTE — Progress Notes (Signed)
Hematology and Oncology Follow Up Visit  [name] [id number] 1940-11-07 71 y.o. 01/07/2012 10:29 AM PCP Dr Dwain Sarna Principle Diagnosis: locally advanced breast cancer, on femara x 4 Months  Interim History:  There have been no intercurrent illness, hospitalizations or medication changes. Tolerating femara, she is having some hot flashes. She is not having other complaints. She followup with her surgeon in August.  Medications: I have reviewed the patient's current medications.  Allergies:  Allergies  Allergen Reactions  . Bisphosphonates     Unclear relation to meds, but pt had long bone fracture after treatment with fosamax  . Diclofenac Potassium   . Ezetimibe-Simvastatin   . Naproxen Sodium   . Simvastatin   . Sulfonamide Derivatives     Past Medical History, Surgical history, Social history, and Family History were reviewed and updated.  Review of Systems: Constitutional:  Negative for fever, chills, night sweats, anorexia, weight loss, pain. Cardiovascular: negative Respiratory: negative Neurological: negative Dermatological: negative ENT: negative Skin Gastrointestinal: no abdominal pain, change in bowel habits, or black or bloody stools Genito-Urinary: negative Hematological and Lymphatic: negative Breast: negative Musculoskeletal: negative Remaining ROS negative.  Physical Exam: Blood pressure 162/76, pulse 57, temperature 98.7 F (37.1 C), height 5' 1.5" (1.562 m), weight 157 lb (71.215 kg). ECOG: 0 General appearance: alert, cooperative and appears stated age Head: Normocephalic, without obvious abnormality, atraumatic Neck: no adenopathy, no carotid bruit, no JVD, supple, symmetrical, trachea midline and thyroid not enlarged, symmetric, no tenderness/mass/nodules Lymph nodes: Cervical, supraclavicular, and axillary nodes normal. Cardiac : regular rate and rhythm, no murmurs or gallops Pulmonary:clear to auscultation bilaterally and normal  percussion bilaterally Breasts: inspection negative, no nipple discharge or bleeding, no masses or nodularity palpable, lt breast mass, is difficult to detect. There is, still has skin indentation Abdomen:normal findings: bowel sounds normal Extremities negative Neuro: alert, oriented, normal speech, no focal findings or movement disorder noted  Lab Results: Lab Results  Component Value Date   WBC 6.8 12/31/2011   HGB 14.0 12/31/2011   HCT 39.3 12/31/2011   MCV 86.6 12/31/2011   PLT 203 12/31/2011     Chemistry      Component Value Date/Time   NA 136 12/31/2011 1340   K 4.0 12/31/2011 1340   CL 101 12/31/2011 1340   CO2 24 12/31/2011 1340   BUN 8 12/31/2011 1340   CREATININE 0.65 12/31/2011 1340      Component Value Date/Time   CALCIUM 9.9 12/31/2011 1340   ALKPHOS 59 12/31/2011 1340   AST 41* 12/31/2011 1340   ALT 50* 12/31/2011 1340   BILITOT 0.4 12/31/2011 1340      .pathology. Radiological Studies: chest X-ray n/a Mammogram n/a Bone density n/a  Impression and Plan: Locally advanced ER/PR positive breast cancer on Femara for 4 months. Appears to have a good response. I will see her after the surgeon sees her rationale surgery done in the next few months. I have once again discussed her bone density issues and she wants to hold off on taking anything for this though she is taking vitamin D.  More than 50% of the visit was spent in patient-related counselling   Pierce Crane, MD 7/26/201310:29 AM

## 2012-01-17 ENCOUNTER — Other Ambulatory Visit: Payer: Self-pay | Admitting: Family Medicine

## 2012-01-17 NOTE — Telephone Encounter (Signed)
Sent!

## 2012-01-17 NOTE — Telephone Encounter (Signed)
Electronic refill request

## 2012-01-28 ENCOUNTER — Ambulatory Visit
Admission: RE | Admit: 2012-01-28 | Discharge: 2012-01-28 | Disposition: A | Discharge status: Home / Self Care | Payer: Medicare Other | Source: Ambulatory Visit | Attending: General Surgery | Admitting: General Surgery

## 2012-01-28 DIAGNOSIS — C50219 Malignant neoplasm of upper-inner quadrant of unspecified female breast: Secondary | ICD-10-CM

## 2012-01-28 MED ORDER — GADOBENATE DIMEGLUMINE 529 MG/ML IV SOLN
14.0000 mL | Freq: Once | INTRAVENOUS | Status: AC | PRN
  Administered 2012-01-28: 14 mL via INTRAVENOUS

## 2012-02-11 ENCOUNTER — Ambulatory Visit (INDEPENDENT_AMBULATORY_CARE_PROVIDER_SITE_OTHER): Payer: Medicare Other | Admitting: General Surgery

## 2012-02-11 ENCOUNTER — Encounter (INDEPENDENT_AMBULATORY_CARE_PROVIDER_SITE_OTHER): Payer: Self-pay | Admitting: General Surgery

## 2012-02-11 ENCOUNTER — Other Ambulatory Visit (INDEPENDENT_AMBULATORY_CARE_PROVIDER_SITE_OTHER): Payer: Self-pay | Admitting: General Surgery

## 2012-02-11 VITALS — BP 120/78 | HR 68 | Temp 98.2°F | Ht 67.5 in | Wt 159.6 lb

## 2012-02-11 DIAGNOSIS — C50219 Malignant neoplasm of upper-inner quadrant of unspecified female breast: Secondary | ICD-10-CM

## 2012-02-11 NOTE — Progress Notes (Signed)
Patient ID: [name], female   DOB: 01/12/1941, 70 y.o.   MRN: [id number]  Chief Complaint  Patient presents with  . Follow-up    reck br    HPI [name] is a 70 y.o. female.   HPI This is a 70-year-old female I initially saw in March of 2013 in the multidisciplinary clinic. She had a medial left breast mass that was very near her sternum at that time. There was both on her exam as well as her MR a question of involvement of her pectoralis muscle. We decided that time to begin her on primary endocrine therapy due to her pathology. She has done very well from this and it is difficult to identify this area at this point. She tolerated her letrozole well. An MRI that was done a couple of weeks ago shows edema and some appears much less masslike in the residual mild enhancement associated with a malignancy measured 1.4 x 0.7 x 1.1 cm. The enhancement along the pectoralis fascia is also decreased there is no overlying skin abnormalities. There continues to be no abnormalities in the right breast, axilla, or internal mammary nodes. She comes back in today to discuss surgery. Past Medical History  Diagnosis Date  . Insomnia, unspecified   . Hypertension   . Hyperlipidemia   . GERD (gastroesophageal reflux disease)   . Anxiety   . Other abnormal glucose   . Osteoporosis, unspecified     with prev fosamax treatment  . Diabetes mellitus 2011    type II, diet controlled  . Anemia   . Blood transfusion   . Cancer     Breast    Past Surgical History  Procedure Date  . Bilateral oophorectomy 1987  . Abdominal hysterectomy 1983  . Tubal ligation 1975  . Orif femur fracture   . Hernia repair 1999    LIH    Family History  Problem Relation Age of Onset  . Diabetes Mother   . Alcohol abuse Father     Social History History  Substance Use Topics  . Smoking status: Never Smoker   . Smokeless tobacco: Not on file  . Alcohol Use: No    Allergies  Allergen Reactions  .  Bisphosphonates     Unclear relation to meds, but pt had long bone fracture after treatment with fosamax  . Diclofenac Potassium   . Ezetimibe-Simvastatin   . Naproxen Sodium   . Simvastatin   . Sulfonamide Derivatives     Current Outpatient Prescriptions  Medication Sig Dispense Refill  . amLODipine (NORVASC) 5 MG tablet Take 1 tablet (5 mg total) by mouth daily.  90 tablet  3  . Cholecalciferol ([employer] VITAMIN D3) 1000 UNITS capsule Take 1 capsule (1,000 Units total) by mouth daily.      . cyclobenzaprine (FLEXERIL) 10 MG tablet TAKE 1/2 TO1 TABLETS BY MOUTH 3 TIMES DAILY AS NEEDED MUSCLE SPASMS (SEDATION CAUTION)  30 tablet  1  . letrozole (FEMARA) 2.5 MG tablet TAKE 1 TABLET BY MOUTH ONCE DAILY  30 tablet  3  . LORazepam (ATIVAN) 1 MG tablet 1 tablet by mouth 2-3 times a day as needed for anxiety and 1-2 tabs at night for sleep  150 tablet  1  . metoprolol (LOPRESSOR) 50 MG tablet Take 0.5 tablets (25 mg total) by mouth 2 (two) times daily.  180 tablet  1  . Omega-3 Fatty Acids (FISH OIL) 1000 MG CAPS Take 1 capsule by mouth 2 (two) times daily.       .   omeprazole (PRILOSEC) 20 MG capsule Take 1 capsule (20 mg total) by mouth daily.  90 capsule  3  . Oyster Shell (OYSTER CALCIUM) 500 MG TABS Take 1 tablet (500 mg of elemental calcium total) by mouth 2 (two) times daily.    0  . sertraline (ZOLOFT) 100 MG tablet Take 1 tablet (100 mg total) by mouth daily.  90 tablet  3    Review of Systems Review of Systems  Blood pressure 120/78, pulse 68, temperature 98.2 F (36.8 C), temperature source Temporal, height 5' 7.5" (1.715 m), weight 159 lb 9.6 oz (72.394 kg), SpO2 97.00%.  Physical Exam Physical Exam  Vitals reviewed. Constitutional: She appears well-developed and well-nourished.  Cardiovascular: Normal rate, regular rhythm and normal heart sounds.   Pulmonary/Chest: Effort normal and breath sounds normal. She has no wheezes. She has no rales. Right breast exhibits no inverted  nipple, no mass, no nipple discharge, no skin change and no tenderness. Left breast exhibits no inverted nipple, no mass, no nipple discharge, no skin change and no tenderness. Breasts are symmetrical.    Lymphadenopathy:    She has no cervical adenopathy.    Data Reviewed BILATERAL BREAST MRI WITH AND WITHOUT CONTRAST  Technique: Multiplanar, multisequence MR images of both breasts  were obtained prior to and following the intravenous administration  of 14ml of Multihance. Three dimensional images were evaluated at  the independent DynaCad workstation.  Comparison: Bilateral breast MRI of 08/13/2011  Findings:  There is a mild background parenchymal enhancement pattern  bilaterally.  The biopsy-proven malignancy in the far posterior upper inner  quadrant of the left breast demonstrates significantly less  enhancement compared to the prior MRI of March 2013, consistent  with positive response to neoadjuvant therapy. The enhancement  appears much less mass-like on today's examination. Some of the  central portions of the malignancy no longer enhance. The residual  mild enhancement associated with the malignancy measures 1.4 x 0.7  x 1.1 cm.  Enhancement along the pectoralis fascia has also decreased. No  overlying skin thickening or enhancement is appreciated.  There are no findings to suggest malignancy in the right breast.  Negative right axillary or internal mammary chain lymphadenopathy.  Cardiomegaly is noted.  IMPRESSION:  Significant positive response to neoadjuvant therapy. Minimal  nonmass-like enhancement is seen at the site of biopsy-proven  malignancy in the deep upper inner quadrant of the left breast.  Enhancement along the pectoralis fascia has also decreased.   Assessment    Left breast cancer on primary endocrine therapy    Plan    Left breast wire-guided lumpectomy, left axilla sentinel lymph node biopsy  I think this is very amenable to lumpectomy this  point I don't think that waiting any more on endocrine therapy is going to help. I discussed with her again today the performance of a lumpectomy as well as axillary sentinel lymph node biopsy which we'll schedule sometime in September.  We discussed a sentinel lymph node biopsy as she does not appear to having lymph node involvement right now. We discussed the performance of that with injection of radioactive tracer and blue dye. We discussed that she would have an incision underneath her axillary hairline. We discussed that there is a bout a 10-20% chance of having a positive node with a sentinel lymph node biopsy and we will await the permanent pathology to make any other first further decisions in terms of her treatment. One of these options might be to return to the   operating room to perform an axillary lymph node dissection. We discussed about a 1-2% risk lifetime of chronic shoulder pain as well as lymphedema associated with a sentinel lymph node biopsy.  We discussed the options for treatment of the breast cancer which included lumpectomy versus a mastectomy. We discussed the performance of the lumpectomy with a wire placement. We discussed a 10-20% chance of a positive margin requiring reexcision in the operating room. We also discussed that she may need radiation therapy or antiestrogen therapy or both if she undergoes lumpectomy. We discussed the mastectomy and the postoperative care for that as well. We discussed that there is no difference in her survival whether she undergoes lumpectomy with radiation therapy or antiestrogen therapy versus a mastectomy. There is a slight difference in the local recurrence rate being 3-5% with lumpectomy and about 1% with a mastectomy. We discussed the risks of operation including bleeding, infection, possible reoperation. She understands her further therapy will be based on what her stages at the time of her operation.         Erice Ahles 02/11/2012,  10:30 AM    

## 2012-02-18 ENCOUNTER — Other Ambulatory Visit: Payer: Self-pay | Admitting: *Deleted

## 2012-02-18 MED ORDER — LORAZEPAM 1 MG PO TABS: ORAL_TABLET | ORAL | Status: DC

## 2012-02-18 NOTE — Telephone Encounter (Signed)
Please call in

## 2012-02-18 NOTE — Telephone Encounter (Signed)
Rx called in as directed.   

## 2012-02-22 ENCOUNTER — Ambulatory Visit (INDEPENDENT_AMBULATORY_CARE_PROVIDER_SITE_OTHER): Payer: Medicare Other | Admitting: Family Medicine

## 2012-02-22 ENCOUNTER — Ambulatory Visit (INDEPENDENT_AMBULATORY_CARE_PROVIDER_SITE_OTHER)
Admission: RE | Admit: 2012-02-22 | Discharge: 2012-02-22 | Disposition: A | Discharge status: Home / Self Care | Payer: Medicare Other | Source: Ambulatory Visit | Attending: Family Medicine | Admitting: Family Medicine

## 2012-02-22 ENCOUNTER — Telehealth (INDEPENDENT_AMBULATORY_CARE_PROVIDER_SITE_OTHER): Payer: Self-pay

## 2012-02-22 ENCOUNTER — Encounter: Payer: Self-pay | Admitting: Family Medicine

## 2012-02-22 VITALS — BP 124/84 | HR 72 | Temp 98.2°F | Wt 159.0 lb

## 2012-02-22 DIAGNOSIS — R0789 Other chest pain: Secondary | ICD-10-CM

## 2012-02-22 DIAGNOSIS — R071 Chest pain on breathing: Secondary | ICD-10-CM

## 2012-02-22 NOTE — Patient Instructions (Addendum)
We'll contact you with your xray report. Take tylenol as needed for pain, not ibuprofen.  Let us know if the pain gets worse.

## 2012-02-22 NOTE — Telephone Encounter (Signed)
Added pt to [facility] for 9/18.

## 2012-02-22 NOTE — Progress Notes (Signed)
She has surgery upcoming for breast cancer.  She's worried about this (as expected) and we discussed.  This isn't disabling anxiety.   She stepped in hole with L foot this weekend while working in the yard. Fell on her R side.  No leg pain but is tender under R ribs in (and just anterior to) midaxillary line.  Some better today.  Prev pain with a cough or sneeze.  No dyspnea and and able to get a deep breath now (with less pain than a few days ago).  Using ibuprofen and liniment with some relief.    Meds, vitals, and allergies reviewed.   ROS: See HPI.  Otherwise, noncontributory.  nad ncat rrr Ctab, equal BS B Chest wall pain- ttp on (and just anterior to) R midaxillary line. No bruise or rash abd soft, not ttp No inc in wob

## 2012-02-23 DIAGNOSIS — R0789 Other chest pain: Secondary | ICD-10-CM | POA: Insufficient documentation

## 2012-02-23 NOTE — Assessment & Plan Note (Signed)
CXR w/o acute changes.  Likely benign process after fall, should resolve.  Use tylenol, avoid nsaids.  Will notify surgery, though I don't think this will be significant with the upcoming procedure.

## 2012-02-24 ENCOUNTER — Encounter (HOSPITAL_BASED_OUTPATIENT_CLINIC_OR_DEPARTMENT_OTHER): Payer: Self-pay | Admitting: *Deleted

## 2012-02-24 NOTE — Progress Notes (Signed)
To come in for labs and ekg-fell  a few weeks ago-bruised rt rib-pcp did cxr-showed copd-pt denies any resp problems except DOE.

## 2012-02-25 ENCOUNTER — Encounter (HOSPITAL_BASED_OUTPATIENT_CLINIC_OR_DEPARTMENT_OTHER)
Admission: RE | Admit: 2012-02-25 | Discharge: 2012-02-25 | Disposition: A | Discharge status: Home / Self Care | Payer: Medicare Other | Source: Ambulatory Visit | Attending: General Surgery | Admitting: General Surgery

## 2012-02-25 LAB — BASIC METABOLIC PANEL
CO2: 26 mEq/L (ref 19–32)
Chloride: 98 mEq/L (ref 96–112)
Potassium: 4.3 mEq/L (ref 3.5–5.1)
Sodium: 134 mEq/L — ABNORMAL LOW (ref 135–145)

## 2012-02-25 LAB — CBC WITH DIFFERENTIAL/PLATELET
Eosinophils Relative: 2 % (ref 0–5)
HCT: 41 % (ref 36.0–46.0)
Lymphocytes Relative: 34 % (ref 12–46)
Lymphs Abs: 2.8 10*3/uL (ref 0.7–4.0)
MCV: 87 fL (ref 78.0–100.0)
Platelets: 234 10*3/uL (ref 150–400)
RBC: 4.71 MIL/uL (ref 3.87–5.11)
WBC: 8.4 10*3/uL (ref 4.0–10.5)

## 2012-03-01 ENCOUNTER — Ambulatory Visit
Admission: RE | Admit: 2012-03-01 | Discharge: 2012-03-01 | Disposition: A | Discharge status: Home / Self Care | Payer: Medicare Other | Source: Ambulatory Visit | Attending: General Surgery | Admitting: General Surgery

## 2012-03-01 ENCOUNTER — Ambulatory Visit (HOSPITAL_BASED_OUTPATIENT_CLINIC_OR_DEPARTMENT_OTHER)
Admission: RE | Admit: 2012-03-01 | Discharge: 2012-03-01 | Disposition: A | Discharge status: Home / Self Care | Payer: Medicare Other | Source: Ambulatory Visit | Attending: General Surgery | Admitting: General Surgery

## 2012-03-01 ENCOUNTER — Encounter (HOSPITAL_BASED_OUTPATIENT_CLINIC_OR_DEPARTMENT_OTHER): Payer: Self-pay | Admitting: *Deleted

## 2012-03-01 ENCOUNTER — Ambulatory Visit (HOSPITAL_COMMUNITY)
Admission: RE | Admit: 2012-03-01 | Discharge: 2012-03-01 | Disposition: A | Discharge status: Home / Self Care | Payer: Medicare Other | Source: Ambulatory Visit | Attending: General Surgery | Admitting: General Surgery

## 2012-03-01 ENCOUNTER — Other Ambulatory Visit (INDEPENDENT_AMBULATORY_CARE_PROVIDER_SITE_OTHER): Payer: Self-pay | Admitting: General Surgery

## 2012-03-01 ENCOUNTER — Encounter (HOSPITAL_BASED_OUTPATIENT_CLINIC_OR_DEPARTMENT_OTHER)
Admission: RE | Disposition: A | Discharge status: Home / Self Care | Payer: Self-pay | Source: Ambulatory Visit | Attending: General Surgery

## 2012-03-01 ENCOUNTER — Ambulatory Visit (HOSPITAL_BASED_OUTPATIENT_CLINIC_OR_DEPARTMENT_OTHER): Payer: Medicare Other | Admitting: Certified Registered Nurse Anesthetist

## 2012-03-01 ENCOUNTER — Encounter (HOSPITAL_BASED_OUTPATIENT_CLINIC_OR_DEPARTMENT_OTHER): Payer: Self-pay | Admitting: Certified Registered Nurse Anesthetist

## 2012-03-01 DIAGNOSIS — E119 Type 2 diabetes mellitus without complications: Secondary | ICD-10-CM | POA: Insufficient documentation

## 2012-03-01 DIAGNOSIS — C50219 Malignant neoplasm of upper-inner quadrant of unspecified female breast: Secondary | ICD-10-CM

## 2012-03-01 DIAGNOSIS — I10 Essential (primary) hypertension: Secondary | ICD-10-CM | POA: Insufficient documentation

## 2012-03-01 DIAGNOSIS — Z01810 Encounter for preprocedural cardiovascular examination: Secondary | ICD-10-CM | POA: Insufficient documentation

## 2012-03-01 DIAGNOSIS — Z01812 Encounter for preprocedural laboratory examination: Secondary | ICD-10-CM | POA: Insufficient documentation

## 2012-03-01 DIAGNOSIS — C50919 Malignant neoplasm of unspecified site of unspecified female breast: Secondary | ICD-10-CM | POA: Insufficient documentation

## 2012-03-01 HISTORY — PX: BREAST LUMPECTOMY: SHX2

## 2012-03-01 HISTORY — DX: Malignant neoplasm of unspecified site of unspecified female breast: C50.919

## 2012-03-01 LAB — GLUCOSE, CAPILLARY: Glucose-Capillary: 226 mg/dL — ABNORMAL HIGH (ref 70–99)

## 2012-03-01 SURGERY — BREAST LUMPECTOMY WITH NEEDLE LOCALIZATION AND AXILLARY SENTINEL LYMPH NODE BX
Anesthesia: General | Site: Breast | Laterality: Left | Wound class: Clean

## 2012-03-01 MED ORDER — OXYCODONE-ACETAMINOPHEN 5-325 MG PO TABS: 1.0000 | ORAL_TABLET | ORAL | Status: DC | PRN

## 2012-03-01 MED ORDER — FENTANYL CITRATE 0.05 MG/ML IJ SOLN
50.0000 ug | Freq: Once | INTRAMUSCULAR | Status: AC
  Administered 2012-03-01: 100 ug via INTRAVENOUS

## 2012-03-01 MED ORDER — BUPIVACAINE HCL (PF) 0.25 % IJ SOLN
INTRAMUSCULAR | Status: DC | PRN
  Administered 2012-03-01: 20 mL

## 2012-03-01 MED ORDER — PROMETHAZINE HCL 25 MG/ML IJ SOLN
6.2500 mg | INTRAMUSCULAR | Status: DC | PRN
  Administered 2012-03-01: 6.25 mg via INTRAVENOUS

## 2012-03-01 MED ORDER — CEFAZOLIN SODIUM-DEXTROSE 2-3 GM-% IV SOLR
2.0000 g | INTRAVENOUS | Status: AC
  Administered 2012-03-01: 2 g via INTRAVENOUS

## 2012-03-01 MED ORDER — DEXAMETHASONE SODIUM PHOSPHATE 4 MG/ML IJ SOLN
INTRAMUSCULAR | Status: DC | PRN
  Administered 2012-03-01: 4 mg via INTRAVENOUS

## 2012-03-01 MED ORDER — LACTATED RINGERS IV SOLN
INTRAVENOUS | Status: DC
  Administered 2012-03-01 (×2): via INTRAVENOUS

## 2012-03-01 MED ORDER — PROPOFOL 10 MG/ML IV BOLUS
INTRAVENOUS | Status: DC | PRN
  Administered 2012-03-01: 200 mg via INTRAVENOUS

## 2012-03-01 MED ORDER — ONDANSETRON HCL 4 MG/2ML IJ SOLN
INTRAMUSCULAR | Status: DC | PRN
  Administered 2012-03-01: 4 mg via INTRAVENOUS

## 2012-03-01 MED ORDER — MIDAZOLAM HCL 2 MG/2ML IJ SOLN
1.0000 mg | INTRAMUSCULAR | Status: DC | PRN
  Administered 2012-03-01: 2 mg via INTRAVENOUS

## 2012-03-01 MED ORDER — LIDOCAINE HCL (CARDIAC) 20 MG/ML IV SOLN
INTRAVENOUS | Status: DC | PRN
  Administered 2012-03-01: 60 mg via INTRAVENOUS

## 2012-03-01 MED ORDER — TECHNETIUM TC 99M SULFUR COLLOID FILTERED
1.0000 | Freq: Once | INTRAVENOUS | Status: AC | PRN
  Administered 2012-03-01: 1 via INTRADERMAL

## 2012-03-01 MED ORDER — FENTANYL CITRATE 0.05 MG/ML IJ SOLN
INTRAMUSCULAR | Status: DC | PRN
  Administered 2012-03-01 (×4): 25 ug via INTRAVENOUS

## 2012-03-01 MED ORDER — HYDROMORPHONE HCL PF 1 MG/ML IJ SOLN: 0.2500 mg | INTRAMUSCULAR | Status: DC | PRN

## 2012-03-01 SURGICAL SUPPLY — 63 items
APPLIER CLIP 9.375 MED OPEN (MISCELLANEOUS) ×2
BENZOIN TINCTURE PRP APPL 2/3 (GAUZE/BANDAGES/DRESSINGS) ×2 IMPLANT
BINDER BREAST LRG (GAUZE/BANDAGES/DRESSINGS) ×2 IMPLANT
BINDER BREAST MEDIUM (GAUZE/BANDAGES/DRESSINGS) IMPLANT
BINDER BREAST XLRG (GAUZE/BANDAGES/DRESSINGS) IMPLANT
BINDER BREAST XXLRG (GAUZE/BANDAGES/DRESSINGS) IMPLANT
BLADE SURG 15 STRL LF DISP TIS (BLADE) ×1 IMPLANT
BLADE SURG 15 STRL SS (BLADE) ×1
BNDG COHESIVE 4X5 TAN STRL (GAUZE/BANDAGES/DRESSINGS) IMPLANT
CANISTER SUCTION 1200CC (MISCELLANEOUS) ×2 IMPLANT
CHLORAPREP W/TINT 26ML (MISCELLANEOUS) ×2 IMPLANT
CLIP APPLIE 9.375 MED OPEN (MISCELLANEOUS) ×1 IMPLANT
CLOTH BEACON ORANGE TIMEOUT ST (SAFETY) ×2 IMPLANT
COVER MAYO STAND STRL (DRAPES) ×2 IMPLANT
COVER PROBE W GEL 5X96 (DRAPES) ×2 IMPLANT
COVER TABLE BACK 60X90 (DRAPES) ×2 IMPLANT
DECANTER SPIKE VIAL GLASS SM (MISCELLANEOUS) IMPLANT
DERMABOND ADVANCED (GAUZE/BANDAGES/DRESSINGS)
DERMABOND ADVANCED .7 DNX12 (GAUZE/BANDAGES/DRESSINGS) IMPLANT
DEVICE DUBIN W/COMP PLATE 8390 (MISCELLANEOUS) IMPLANT
DRAIN CHANNEL 19F RND (DRAIN) IMPLANT
DRAPE LAPAROSCOPIC ABDOMINAL (DRAPES) IMPLANT
DRAPE U-SHAPE 76X120 STRL (DRAPES) IMPLANT
DRSG TEGADERM 4X4.75 (GAUZE/BANDAGES/DRESSINGS) ×4 IMPLANT
ELECT COATED BLADE 2.86 ST (ELECTRODE) ×2 IMPLANT
ELECT REM PT RETURN 9FT ADLT (ELECTROSURGICAL) ×2
ELECTRODE REM PT RTRN 9FT ADLT (ELECTROSURGICAL) ×1 IMPLANT
EVACUATOR SILICONE 100CC (DRAIN) IMPLANT
GAUZE SPONGE 4X4 12PLY STRL LF (GAUZE/BANDAGES/DRESSINGS) IMPLANT
GLOVE BIO SURGEON STRL SZ7 (GLOVE) ×2 IMPLANT
GLOVE BIOGEL PI IND STRL 7.5 (GLOVE) ×1 IMPLANT
GLOVE BIOGEL PI INDICATOR 7.5 (GLOVE) ×1
GLOVE ECLIPSE 6.5 STRL STRAW (GLOVE) ×2 IMPLANT
GOWN PREVENTION PLUS XLARGE (GOWN DISPOSABLE) ×4 IMPLANT
KIT MARKER MARGIN INK (KITS) ×2 IMPLANT
NDL SAFETY ECLIPSE 18X1.5 (NEEDLE) IMPLANT
NEEDLE HYPO 18GX1.5 SHARP (NEEDLE)
NEEDLE HYPO 25X1 1.5 SAFETY (NEEDLE) ×4 IMPLANT
NS IRRIG 1000ML POUR BTL (IV SOLUTION) ×2 IMPLANT
PACK BASIN DAY SURGERY FS (CUSTOM PROCEDURE TRAY) ×2 IMPLANT
PENCIL BUTTON HOLSTER BLD 10FT (ELECTRODE) ×2 IMPLANT
PIN SAFETY STERILE (MISCELLANEOUS) IMPLANT
SLEEVE SCD COMPRESS KNEE MED (MISCELLANEOUS) ×2 IMPLANT
SPONGE LAP 18X18 X RAY DECT (DISPOSABLE) ×2 IMPLANT
SPONGE LAP 4X18 X RAY DECT (DISPOSABLE) ×2 IMPLANT
STAPLER VISISTAT 35W (STAPLE) ×2 IMPLANT
STOCKINETTE IMPERVIOUS LG (DRAPES) IMPLANT
STRIP CLOSURE SKIN 1/2X4 (GAUZE/BANDAGES/DRESSINGS) ×2 IMPLANT
SUT MNCRL AB 4-0 PS2 18 (SUTURE) ×2 IMPLANT
SUT MON AB 5-0 PS2 18 (SUTURE) IMPLANT
SUT SILK 2 0 SH (SUTURE) IMPLANT
SUT VIC AB 2-0 SH 27 (SUTURE) ×2
SUT VIC AB 2-0 SH 27XBRD (SUTURE) ×2 IMPLANT
SUT VIC AB 3-0 SH 27 (SUTURE) ×2
SUT VIC AB 3-0 SH 27X BRD (SUTURE) ×2 IMPLANT
SUT VIC AB 5-0 PS2 18 (SUTURE) IMPLANT
SUT VICRYL AB 3 0 TIES (SUTURE) IMPLANT
SYR CONTROL 10ML LL (SYRINGE) ×2 IMPLANT
TOWEL OR 17X24 6PK STRL BLUE (TOWEL DISPOSABLE) ×2 IMPLANT
TOWEL OR NON WOVEN STRL DISP B (DISPOSABLE) ×2 IMPLANT
TUBE CONNECTING 20X1/4 (TUBING) ×2 IMPLANT
WATER STERILE IRR 1000ML POUR (IV SOLUTION) IMPLANT
YANKAUER SUCT BULB TIP NO VENT (SUCTIONS) ×2 IMPLANT

## 2012-03-01 NOTE — Op Note (Signed)
Preoperative diagnosis: Clinical stage I left breast cancer status post primary endocrine therapy Postoperative diagnosis: Same as above Procedure: #1 left breast wire-guided lumpectomy #2 left axillary sentinel lymph node biopsy Surgeon: Dr. Harden Mo Anesthesia: Gen. With LMA Specimens: #1 left breast tissue marked with paint #2 left axillary sentinel node bundle with counts of 1599, 383, 197 Complications: None Drains: None Sponge and needle count was correct x2 at end of operation EBL: Minimal Disposition to recovery room in stable condition  Indications: This is a 71 yo female I saw about 6 months ago who was identified to have a very medial left breast cancer. This underwent biopsy and this was a hormone receptor-positive invasive ductal carcinoma. This was adherent to her sternum and her pectoralis muscle at that point in time and we elected to treat her with anti-estrogen therapy. She is a very good response to this and it looks both by MR and her exam this is off her muscle and is mobile. She and I discussed a lumpectomy as well as a sentinel lymph node biopsy.  Procedure: After informed consent was obtained the patient was first taken to the breast center where she had a wire placed with ultrasound guidance. She was then brought to day surgery. She was injected with technetium in the standard periareolar fashion. She was then taken to the operating room. She was administered 2 g of intravenous cefazolin. Sequential compression devices were placed on her legs. She then underwent general anesthesia. Her left breast and axilla were then prepped and draped in the standard sterile surgical fashion. Surgical tunnels are performed.  I used the ultrasound to identify the mass as well as the wire going through it. I then made an elliptical incision and excised a portion of skin overlying the mass as well. I brought the wire in remotely. I excised this all the way down to and including the  pectoralis fascia with an attempt to get clear margins. There did not grossly look like there was any invasion of her pectoralis muscle. Hemostasis was then obtained. I then placed 2 clips deep. I placed a clip in each of the position from the cavity. I closed this with 2-0 Vicryl, 3-0 Vicryl, 4-0 Monocryl, and Steri-Strips and benzoin. Mammogram was taken confirming removal of mass, clip and wire.  This was confirmed by radiology I then identified the location of her sentinel nodes in her axilla. I made a 2 cm incision below her hairline. I carried this through the axillary fascia. There was a bundle of what appeared to be 3 sentinel nodes. This was excised as with the counts above. There was no background radioactivity. Hemostasis was obtained. I closed the axillary fashion with 2-0 Vicryl. I closed the dermis with 3-0 Vicryl and the skin with 4-0 Monocryl. I placed Steri-Strips over this incision. I injected quarter percent Marcaine throughout both incisions. Dressings were placed. She had breast binder placed. She was extubated and transferred to recovery in stable condition.

## 2012-03-01 NOTE — Transfer of Care (Signed)
Immediate Anesthesia Transfer of Care Note  Patient: [name]  Procedure(s) Performed: Procedure(s) (LRB) with comments: BREAST LUMPECTOMY WITH NEEDLE LOCALIZATION AND AXILLARY SENTINEL LYMPH NODE BX (Left) - left breast wire guided lumpectomy left axillary sentinel node biopsy  Patient Location: PACU  Anesthesia Type: General  Level of Consciousness: awake, alert , oriented and patient cooperative  Airway & Oxygen Therapy: Patient Spontanous Breathing and Patient connected to face mask oxygen  Post-op Assessment: Report given to PACU RN and Post -op Vital signs reviewed and stable  Post vital signs: Reviewed and stable  Complications: No apparent anesthesia complications

## 2012-03-01 NOTE — Anesthesia Preprocedure Evaluation (Signed)
Anesthesia Evaluation  Patient identified by MRN, date of birth, ID band Patient awake    Reviewed: Allergy & Precautions, H&P , NPO status , Patient's Chart, lab work & pertinent test results  Airway Mallampati: I TM Distance: >3 FB Neck ROM: Full    Dental   Pulmonary  breath sounds clear to auscultation        Cardiovascular hypertension, Rhythm:Regular Rate:Normal     Neuro/Psych Anxiety    GI/Hepatic GERD-  ,  Endo/Other  diabetes  Renal/GU      Musculoskeletal   Abdominal (+) + obese,   Peds  Hematology   Anesthesia Other Findings   Reproductive/Obstetrics                           Anesthesia Physical Anesthesia Plan  ASA: III  Anesthesia Plan: General   Post-op Pain Management:    Induction: Intravenous  Airway Management Planned: LMA  Additional Equipment:   Intra-op Plan:   Post-operative Plan: Extubation in OR  Informed Consent: I have reviewed the patients History and Physical, chart, labs and discussed the procedure including the risks, benefits and alternatives for the proposed anesthesia with the patient or authorized representative who has indicated his/her understanding and acceptance.     Plan Discussed with: CRNA and Surgeon  Anesthesia Plan Comments:         Anesthesia Quick Evaluation

## 2012-03-01 NOTE — Anesthesia Postprocedure Evaluation (Signed)
  Anesthesia Post-op Note  Patient: [name]  Procedure(s) Performed: Procedure(s) (LRB) with comments: BREAST LUMPECTOMY WITH NEEDLE LOCALIZATION AND AXILLARY SENTINEL LYMPH NODE BX (Left) - left breast wire guided lumpectomy left axillary sentinel node biopsy  Patient Location: PACU  Anesthesia Type: General  Level of Consciousness: awake and alert   Airway and Oxygen Therapy: Patient Spontanous Breathing  Post-op Pain: mild  Post-op Assessment: Post-op Vital signs reviewed, Patient's Cardiovascular Status Stable, Respiratory Function Stable, Patent Airway and No signs of Nausea or vomiting   Post-op Vital Signs: stable  Complications: No apparent anesthesia complications

## 2012-03-01 NOTE — Anesthesia Procedure Notes (Signed)
Procedure Name: LMA Insertion Date/Time: 03/01/2012 10:04 AM Performed by: Evlyn Amason D Pre-anesthesia Checklist: Patient identified, Emergency Drugs available, Suction available and Patient being monitored Patient Re-evaluated:Patient Re-evaluated prior to inductionOxygen Delivery Method: Circle System Utilized Preoxygenation: Pre-oxygenation with 100% oxygen Intubation Type: IV induction Ventilation: Mask ventilation without difficulty LMA: LMA inserted LMA Size: 4.0 Number of attempts: 1 Airway Equipment and Method: bite block Placement Confirmation: positive ETCO2 Tube secured with: Tape Dental Injury: Teeth and Oropharynx as per pre-operative assessment

## 2012-03-01 NOTE — Progress Notes (Signed)
Emotional support during breast injections °

## 2012-03-01 NOTE — H&P (View-Only) (Signed)
Patient ID: [name], female   DOB: 04/15/1941, 71 y.o.   MRN: [id number]  Chief Complaint  Patient presents with  . Follow-up    reck br    HPI [name] is a 71 y.o. female.   HPI This is a 71 year old female I initially saw in March of 2013 in the multidisciplinary clinic. She had a medial left breast mass that was very near her sternum at that time. There was both on her exam as well as her MR a question of involvement of her pectoralis muscle. We decided that time to begin her on primary endocrine therapy due to her pathology. She has done very well from this and it is difficult to identify this area at this point. She tolerated her letrozole well. An MRI that was done a couple of weeks ago shows edema and some appears much less masslike in the residual mild enhancement associated with a malignancy measured 1.4 x 0.7 x 1.1 cm. The enhancement along the pectoralis fascia is also decreased there is no overlying skin abnormalities. There continues to be no abnormalities in the right breast, axilla, or internal mammary nodes. She comes back in today to discuss surgery. Past Medical History  Diagnosis Date  . Insomnia, unspecified   . Hypertension   . Hyperlipidemia   . GERD (gastroesophageal reflux disease)   . Anxiety   . Other abnormal glucose   . Osteoporosis, unspecified     with prev fosamax treatment  . Diabetes mellitus 2011    type II, diet controlled  . Anemia   . Blood transfusion   . Cancer     Breast    Past Surgical History  Procedure Date  . Bilateral oophorectomy 1987  . Abdominal hysterectomy 1983  . Tubal ligation 1975  . Orif femur fracture   . Hernia repair 1999    LIH    Family History  Problem Relation Age of Onset  . Diabetes Mother   . Alcohol abuse Father     Social History History  Substance Use Topics  . Smoking status: Never Smoker   . Smokeless tobacco: Not on file  . Alcohol Use: No    Allergies  Allergen Reactions  .  Bisphosphonates     Unclear relation to meds, but pt had long bone fracture after treatment with fosamax  . Diclofenac Potassium   . Ezetimibe-Simvastatin   . Naproxen Sodium   . Simvastatin   . Sulfonamide Derivatives     Current Outpatient Prescriptions  Medication Sig Dispense Refill  . amLODipine (NORVASC) 5 MG tablet Take 1 tablet (5 mg total) by mouth daily.  90 tablet  3  . Cholecalciferol ([employer] VITAMIN D3) 1000 UNITS capsule Take 1 capsule (1,000 Units total) by mouth daily.      . cyclobenzaprine (FLEXERIL) 10 MG tablet TAKE 1/2 TO1 TABLETS BY MOUTH 3 TIMES DAILY AS NEEDED MUSCLE SPASMS (SEDATION CAUTION)  30 tablet  1  . letrozole (FEMARA) 2.5 MG tablet TAKE 1 TABLET BY MOUTH ONCE DAILY  30 tablet  3  . LORazepam (ATIVAN) 1 MG tablet 1 tablet by mouth 2-3 times a day as needed for anxiety and 1-2 tabs at night for sleep  150 tablet  1  . metoprolol (LOPRESSOR) 50 MG tablet Take 0.5 tablets (25 mg total) by mouth 2 (two) times daily.  180 tablet  1  . Omega-3 Fatty Acids (FISH OIL) 1000 MG CAPS Take 1 capsule by mouth 2 (two) times daily.       Marland Kitchen  omeprazole (PRILOSEC) 20 MG capsule Take 1 capsule (20 mg total) by mouth daily.  90 capsule  3  . Oyster Shell (OYSTER CALCIUM) 500 MG TABS Take 1 tablet (500 mg of elemental calcium total) by mouth 2 (two) times daily.    0  . sertraline (ZOLOFT) 100 MG tablet Take 1 tablet (100 mg total) by mouth daily.  90 tablet  3    Review of Systems Review of Systems  Blood pressure 120/78, pulse 68, temperature 98.2 F (36.8 C), temperature source Temporal, height 5' 7.5" (1.715 m), weight 159 lb 9.6 oz (72.394 kg), SpO2 97.00%.  Physical Exam Physical Exam  Vitals reviewed. Constitutional: She appears well-developed and well-nourished.  Cardiovascular: Normal rate, regular rhythm and normal heart sounds.   Pulmonary/Chest: Effort normal and breath sounds normal. She has no wheezes. She has no rales. Right breast exhibits no inverted  nipple, no mass, no nipple discharge, no skin change and no tenderness. Left breast exhibits no inverted nipple, no mass, no nipple discharge, no skin change and no tenderness. Breasts are symmetrical.    Lymphadenopathy:    She has no cervical adenopathy.    Data Reviewed BILATERAL BREAST MRI WITH AND WITHOUT CONTRAST  Technique: Multiplanar, multisequence MR images of both breasts  were obtained prior to and following the intravenous administration  of 14ml of Multihance. Three dimensional images were evaluated at  the independent DynaCad workstation.  Comparison: Bilateral breast MRI of 08/13/2011  Findings:  There is a mild background parenchymal enhancement pattern  bilaterally.  The biopsy-proven malignancy in the far posterior upper inner  quadrant of the left breast demonstrates significantly less  enhancement compared to the prior MRI of March 2013, consistent  with positive response to neoadjuvant therapy. The enhancement  appears much less mass-like on today's examination. Some of the  central portions of the malignancy no longer enhance. The residual  mild enhancement associated with the malignancy measures 1.4 x 0.7  x 1.1 cm.  Enhancement along the pectoralis fascia has also decreased. No  overlying skin thickening or enhancement is appreciated.  There are no findings to suggest malignancy in the right breast.  Negative right axillary or internal mammary chain lymphadenopathy.  Cardiomegaly is noted.  IMPRESSION:  Significant positive response to neoadjuvant therapy. Minimal  nonmass-like enhancement is seen at the site of biopsy-proven  malignancy in the deep upper inner quadrant of the left breast.  Enhancement along the pectoralis fascia has also decreased.   Assessment    Left breast cancer on primary endocrine therapy    Plan    Left breast wire-guided lumpectomy, left axilla sentinel lymph node biopsy  I think this is very amenable to lumpectomy this  point I don't think that waiting any more on endocrine therapy is going to help. I discussed with her again today the performance of a lumpectomy as well as axillary sentinel lymph node biopsy which we'll schedule sometime in September.  We discussed a sentinel lymph node biopsy as she does not appear to having lymph node involvement right now. We discussed the performance of that with injection of radioactive tracer and blue dye. We discussed that she would have an incision underneath her axillary hairline. We discussed that there is a bout a 10-20% chance of having a positive node with a sentinel lymph node biopsy and we will await the permanent pathology to make any other first further decisions in terms of her treatment. One of these options might be to return to the  operating room to perform an axillary lymph node dissection. We discussed about a 1-2% risk lifetime of chronic shoulder pain as well as lymphedema associated with a sentinel lymph node biopsy.  We discussed the options for treatment of the breast cancer which included lumpectomy versus a mastectomy. We discussed the performance of the lumpectomy with a wire placement. We discussed a 10-20% chance of a positive margin requiring reexcision in the operating room. We also discussed that she may need radiation therapy or antiestrogen therapy or both if she undergoes lumpectomy. We discussed the mastectomy and the postoperative care for that as well. We discussed that there is no difference in her survival whether she undergoes lumpectomy with radiation therapy or antiestrogen therapy versus a mastectomy. There is a slight difference in the local recurrence rate being 3-5% with lumpectomy and about 1% with a mastectomy. We discussed the risks of operation including bleeding, infection, possible reoperation. She understands her further therapy will be based on what her stages at the time of her operation.         [name] 02/11/2012,  10:30 AM

## 2012-03-01 NOTE — Addendum Note (Signed)
Addendum  created 03/01/12 1227 by Bedelia Person, MD   Modules edited:PRL Based Order Sets

## 2012-03-01 NOTE — Interval H&P Note (Signed)
History and Physical Interval Note:  03/01/2012 9:14 AM  [name]  has presented today for surgery, with the diagnosis of left breast cancer  The various methods of treatment have been discussed with the patient and family. After consideration of risks, benefits and other options for treatment, the patient has consented to  Procedure(s) (LRB) with comments: BREAST LUMPECTOMY WITH NEEDLE LOCALIZATION AND AXILLARY SENTINEL LYMPH NODE BX (Left) - left breast wire guided lumpectomy left axillary sentinel node biopsy as a surgical intervention .  The patient's history has been reviewed, patient examined, no change in status, stable for surgery.  I have reviewed the patient's chart and labs.  Questions were answered to the patient's satisfaction.     Gayathri Futrell

## 2012-03-07 ENCOUNTER — Telehealth (INDEPENDENT_AMBULATORY_CARE_PROVIDER_SITE_OTHER): Payer: Self-pay

## 2012-03-07 NOTE — Telephone Encounter (Signed)
She would like Dr. Dwain Sarna to call her to discuss her mother's pathology results.

## 2012-03-15 ENCOUNTER — Encounter: Payer: Self-pay | Admitting: *Deleted

## 2012-03-16 ENCOUNTER — Telehealth: Payer: Self-pay | Admitting: *Deleted

## 2012-03-16 NOTE — Telephone Encounter (Signed)
Left voice message to inform the patient of the new date and time of the appointment

## 2012-03-17 ENCOUNTER — Telehealth (INDEPENDENT_AMBULATORY_CARE_PROVIDER_SITE_OTHER): Payer: Self-pay | Admitting: General Surgery

## 2012-03-17 ENCOUNTER — Encounter (INDEPENDENT_AMBULATORY_CARE_PROVIDER_SITE_OTHER): Payer: Self-pay | Admitting: General Surgery

## 2012-03-17 ENCOUNTER — Ambulatory Visit (INDEPENDENT_AMBULATORY_CARE_PROVIDER_SITE_OTHER): Payer: Medicare Other | Admitting: General Surgery

## 2012-03-17 VITALS — BP 132/74 | HR 68 | Temp 97.4°F | Resp 16 | Ht 61.0 in | Wt 155.4 lb

## 2012-03-17 DIAGNOSIS — Z09 Encounter for follow-up examination after completed treatment for conditions other than malignant neoplasm: Secondary | ICD-10-CM

## 2012-03-17 MED ORDER — OXYCODONE-ACETAMINOPHEN 5-325 MG PO TABS
1.0000 | ORAL_TABLET | ORAL | Status: DC | PRN
Start: 2012-03-17 — End: 2012-04-10

## 2012-03-17 NOTE — Telephone Encounter (Signed)
Message copied by Littie Deeds on Fri Mar 17, 2012  8:20 AM ------      Message from: [facility], MATTHEW      Created: Wed Mar 15, 2012  2:22 PM       Dawn      Ms Bento should have appts soon to see med and rad onc. We discussed her this am. She isn't clear. Does she have these set up?      Thanks      [employer]

## 2012-03-17 NOTE — Telephone Encounter (Signed)
LMOM letting pt know that she has an appt with rad onc Dr. Dayton Scrape on 10/8 and an appt with med onc Dr. Donnie Coffin on 10/9.

## 2012-03-17 NOTE — Progress Notes (Signed)
Subjective:     Patient ID: [name], female   DOB: 17-Nov-1940, 71 y.o.   MRN: [id number]  HPI This is a 71 year old female who underwent primary endocrine therapy for left breast cancer to her sternum. I took her to the operating room recently for which she is doing well. Her pathology shows a stage I breast cancer after primary endocrine therapy that it falls the skeletal muscle. I removed a fair amount of this muscle so there's nothing really else to remove surgically at all. Her node was negative. She returns today without any complaints except for some occasional soreness.  Review of Systems     Objective:   Physical Exam Healing left breast and left axillary incision with no infection    Assessment:     Stage I left breast cancer after primary endocrine therapy    Plan:     I discussed with her the results of her pathology. She knows that she will need radiation therapy to this area. There is nothing really else to do surgically at this site. I also refer her to the after breast cancer physical therapy classed as well. I will see her back after she completes radiation therapy. Her daughter was present and also understands all of this.

## 2012-03-20 ENCOUNTER — Encounter: Payer: Self-pay | Admitting: *Deleted

## 2012-03-21 ENCOUNTER — Telehealth: Payer: Self-pay | Admitting: *Deleted

## 2012-03-21 ENCOUNTER — Encounter: Payer: Self-pay | Admitting: *Deleted

## 2012-03-21 ENCOUNTER — Ambulatory Visit
Admission: RE | Admit: 2012-03-21 | Discharge: 2012-03-21 | Disposition: A | Discharge status: Home / Self Care | Payer: Medicare Other | Source: Ambulatory Visit | Attending: Radiation Oncology | Admitting: Radiation Oncology

## 2012-03-21 ENCOUNTER — Encounter: Payer: Self-pay | Admitting: Radiation Oncology

## 2012-03-21 ENCOUNTER — Other Ambulatory Visit: Payer: Self-pay | Admitting: *Deleted

## 2012-03-21 VITALS — BP 183/67 | HR 68 | Temp 97.5°F | Resp 20 | Wt 155.3 lb

## 2012-03-21 DIAGNOSIS — C50219 Malignant neoplasm of upper-inner quadrant of unspecified female breast: Secondary | ICD-10-CM

## 2012-03-21 DIAGNOSIS — Z170 Estrogen receptor positive status [ER+]: Secondary | ICD-10-CM | POA: Insufficient documentation

## 2012-03-21 DIAGNOSIS — Z510 Encounter for antineoplastic radiation therapy: Secondary | ICD-10-CM | POA: Insufficient documentation

## 2012-03-21 DIAGNOSIS — C50919 Malignant neoplasm of unspecified site of unspecified female breast: Secondary | ICD-10-CM | POA: Insufficient documentation

## 2012-03-21 HISTORY — DX: Depression, unspecified: F32.A

## 2012-03-21 HISTORY — DX: Major depressive disorder, single episode, unspecified: F32.9

## 2012-03-21 HISTORY — DX: Encounter for other specified aftercare: Z51.89

## 2012-03-21 NOTE — Progress Notes (Signed)
FUNC  Left Breast Invasive ductal carcinoma, (T1, NO, MO) seen March 11/2011 [facility]  DX:08/09/11 Left breast bx 9 o'clock parasternal=Invasive mammary Carcinoma,In situ   03/01/12  Left breast lumpectomy =invasive ductal ca ,grade I/III, present at the posterior  Resection margin and involves skeletal muscle ,perineural invasion identified Stage 1 left reast cancer after primary endocrine therapy  Married,4 children, incision on left breast inner, bruising seen ,incicsion healed, numbness there stated pataient, incision under left axilla healed,  Tenderness shooting /pain every now and then  Menarche age 15,menopause age 6,no hx HRT

## 2012-03-21 NOTE — Progress Notes (Signed)
Followup note:  Diagnosis: Stage I (T1, N0, M0) invasive ductal carcinoma of the left breast  [name] returns today for discussion of radiation therapy following neoadjuvant hormone therapy and conservative surgery in the management of her stage I (T1, N0, M0) invasive ductal carcinoma of the left breast. I saw her at the multidisciplinary breast clinic on 08/18/2011. She presented with a left parasternal breast mass in December of 2013. On ultrasound the deep aspect of the mass contact of the pectoralis major muscle. Ultrasound-guided core biopsy on 08/09/2011 was diagnostic for invasive ductal carcinoma which was ER positive at 100% and PR positive at 70% with a proliferation marker of 20%. Breast MR on 08/13/2011 showed a 1.9 x 1.7 x 1.5 cm mass along the lower inner quadrant of the left breast abutting the pectoralis muscle. She will onto receive neoadjuvant Femara for approximately 6 months of the direction of Dr. Donnie Coffin. A followup MRI scan on 01/28/2012 showed residual tumor measure 1.4 x 0.7 x 1.1 cm. She will onto have a left partial mastectomy and sentinel lymph node biopsy by Dr. Dwain Sarna on 03/01/2012. She is found to have a 1.2 cm invasive ductal carcinoma with carcinoma present at the posterior resection margin involving skeletal muscle. There was focal involvement. There was no LV I. A single lymph node was free of metastatic disease. Her estrogen receptor was 100% and progesterone receptor 17% with a Ki-67 of 20%. She is doing well postoperatively although she has occasional "shooting pain" along her surgical bed.  Physical examination: Alert and oriented. Wt Readings from Last 3 Encounters:  03/21/12 155 lb 4.8 oz (70.444 kg)  03/17/12 155 lb 6 oz (70.478 kg)  03/01/12 154 lb 3.2 oz (69.945 kg)   Temp Readings from Last 3 Encounters:  03/21/12 97.5 F (36.4 C) Oral  03/17/12 97.4 F (36.3 C) Temporal  03/01/12 97.8 F (36.6 C) Oral   BP Readings from Last 3 Encounters:    03/21/12 183/67  03/17/12 132/74  03/01/12 177/83   Pulse Readings from Last 3 Encounters:  03/21/12 68  03/17/12 68  03/01/12 83    Head and neck examination: Grossly unremarkable. Nodes: Without palpable cervical, supraclavicular, or axillary lymphadenopathy. Chest: Lungs clear. Heart: Regular in rhythm. Breasts: There is a partial mastectomy wound all the inner aspect of the left breast at approximately 9:00. There is soft tissue swelling which may or present a small seroma and/or hematoma. Right breast without masses or lesions. Abdomen without hepatomegaly. Back without spinal or CVA tenderness. Extremities without edema.  Laboratory data: Lab Results  Component Value Date   WBC 8.4 02/25/2012   HGB 14.3 02/25/2012   HCT 41.0 02/25/2012   MCV 87.0 02/25/2012   PLT 234 02/25/2012   Impression: Stage I (T1, N0, M0) invasive ductal carcinoma of the left breast. I explained to the patient and her sister that local treatment options include mastectomy or partial mastectomy followed by radiation therapy. Even if she had a mastectomy she would require postoperative radiation therapy. From a technical standpoint I suspect that she will need IMRT to adequately cover her tumor bed and left breast while the same time avoiding significant lung and cardiac irradiation. I discussed the potential acute and late toxicities of radiation therapy and she wishes to proceed as outlined. Consent is signed today. I will have her return next week for simulation/treatment planning.  Plan: As discussed above.  30 minutes was spent face-to-face with the patient and her sister, primarily counseling the patient.

## 2012-03-21 NOTE — Progress Notes (Signed)
Please see the Nurse Progress Note in the MD Initial Consult Encounter for this patient. 

## 2012-03-21 NOTE — Telephone Encounter (Signed)
Confirmed appts for 10/8 and 10/9.  Pt denies further needs at this time.  Encourage pt to call with questions.  Contact information given.

## 2012-03-22 ENCOUNTER — Ambulatory Visit (HOSPITAL_BASED_OUTPATIENT_CLINIC_OR_DEPARTMENT_OTHER): Payer: Medicare Other | Admitting: Oncology

## 2012-03-22 ENCOUNTER — Other Ambulatory Visit (HOSPITAL_BASED_OUTPATIENT_CLINIC_OR_DEPARTMENT_OTHER): Payer: Medicare Other | Admitting: Lab

## 2012-03-22 ENCOUNTER — Other Ambulatory Visit: Payer: Self-pay | Admitting: Oncology

## 2012-03-22 ENCOUNTER — Other Ambulatory Visit: Payer: Self-pay | Admitting: *Deleted

## 2012-03-22 ENCOUNTER — Telehealth: Payer: Self-pay | Admitting: Oncology

## 2012-03-22 VITALS — BP 169/77 | HR 55 | Temp 98.5°F | Resp 20 | Ht 61.0 in | Wt 154.0 lb

## 2012-03-22 DIAGNOSIS — C50219 Malignant neoplasm of upper-inner quadrant of unspecified female breast: Secondary | ICD-10-CM

## 2012-03-22 LAB — COMPREHENSIVE METABOLIC PANEL (CC13)
ALT: 45 U/L (ref 0–55)
AST: 36 U/L — ABNORMAL HIGH (ref 5–34)
BUN: 14 mg/dL (ref 7.0–26.0)
CO2: 25 mEq/L (ref 22–29)
Calcium: 10.4 mg/dL (ref 8.4–10.4)
Chloride: 100 mEq/L (ref 98–107)
Creatinine: 0.8 mg/dL (ref 0.6–1.1)
Total Bilirubin: 0.5 mg/dL (ref 0.20–1.20)

## 2012-03-22 LAB — CBC WITH DIFFERENTIAL/PLATELET
BASO%: 0.7 % (ref 0.0–2.0)
Basophils Absolute: 0.1 10*3/uL (ref 0.0–0.1)
EOS%: 1.9 % (ref 0.0–7.0)
HCT: 39.3 % (ref 34.8–46.6)
HGB: 13.5 g/dL (ref 11.6–15.9)
LYMPH%: 27.9 % (ref 14.0–49.7)
MCH: 30.5 pg (ref 25.1–34.0)
MCHC: 34.4 g/dL (ref 31.5–36.0)
MCV: 88.7 fL (ref 79.5–101.0)
NEUT%: 60.7 % (ref 38.4–76.8)
Platelets: 250 10*3/uL (ref 145–400)
lymph#: 2.3 10*3/uL (ref 0.9–3.3)

## 2012-03-22 LAB — URINALYSIS, MICROSCOPIC - CHCC
Glucose: NEGATIVE g/dL
Ketones: NEGATIVE mg/dL
Specific Gravity, Urine: 1.005 (ref 1.003–1.035)

## 2012-03-22 MED ORDER — CIPROFLOXACIN HCL 250 MG PO TABS: 250.0000 mg | ORAL_TABLET | Freq: Two times a day (BID) | ORAL | Status: DC

## 2012-03-22 NOTE — Telephone Encounter (Signed)
gve the pt her April 2014 appt calendar °

## 2012-03-22 NOTE — Telephone Encounter (Signed)
Message left for pt that rx for cipro has been escribed to [facility]

## 2012-03-22 NOTE — Progress Notes (Signed)
Per pt request, Rx for cipro has been called to [facility] in [place]

## 2012-03-27 ENCOUNTER — Other Ambulatory Visit: Payer: Self-pay | Admitting: Family Medicine

## 2012-03-28 ENCOUNTER — Ambulatory Visit
Admission: RE | Admit: 2012-03-28 | Discharge: 2012-03-28 | Disposition: A | Discharge status: Home / Self Care | Payer: Medicare Other | Source: Ambulatory Visit | Attending: Radiation Oncology | Admitting: Radiation Oncology

## 2012-03-28 DIAGNOSIS — C50219 Malignant neoplasm of upper-inner quadrant of unspecified female breast: Secondary | ICD-10-CM

## 2012-03-29 ENCOUNTER — Telehealth: Payer: Self-pay | Admitting: Family Medicine

## 2012-03-29 NOTE — Telephone Encounter (Signed)
Called pt.  Wished her well with rady tx.  She thanked me for the call.  I app help of all involved.

## 2012-03-29 NOTE — Progress Notes (Signed)
Simulation/treatment planning note: The patient was simulation/treatment planning on 03/28/2012 in the management of her carcinoma the left breast. She was placed on a custom breast board with a custom neck mold for immobilization. Her field borders were marked with radiopaque wires. Her partial mastectomy scar along her medial left breast was also marked with a radiopaque wire. She was then scanned. I contoured her tumor bed (tumor bed CTV), left breast (breast CTV), and also left internal mammary lymph node (intramammary lymph node CTV). Dosimetry contoured the remaining normal structures including the heart and lung. I'm expanding her perspective CTV's by 0.5 cm. She underwent image fusion, and there is felt to be displacement of the sternum by approximately 0.8 0.9 mm with deep inspiration. Therefore, I felt that a 0.5 cm expansion would be adequate. I prescribing 5000 cGy in 25 sessions to her PTV 5000. She is now ready for IMRT simulation/treatment planning. I anticipate using 6 MV photons with helical Tomotherapy. We will also look at deep inspiration breath-hold with static IMRT beams.

## 2012-03-31 NOTE — Addendum Note (Signed)
Encounter addended by: Lowella Petties, RN on: 03/31/2012 12:06 PM<BR>     Documentation filed: Charges VN

## 2012-04-06 ENCOUNTER — Ambulatory Visit: Payer: Medicare Other | Admitting: Radiation Oncology

## 2012-04-06 ENCOUNTER — Ambulatory Visit: Payer: Medicare Other

## 2012-04-06 ENCOUNTER — Encounter: Payer: Self-pay | Admitting: Radiation Oncology

## 2012-04-06 NOTE — Progress Notes (Signed)
IMRT simulation/treatment planning note: The patient underwent IMRT simulation/treatment planning in the management of her carcinoma of the left breast. IMRT was chosen to decrease the risk for cardiac toxicity compared to conventional or 3-D conformal radiation therapy. She underwent treatment planning with Tomotherapy and also ARC and static beam IMRT, and comparisons were made. The Tomotherapy plan to be superior with respect to our avoidance goals including the heart, and lung. We also had better and more homogeneous coverage of the left breast with Tomotherapy IMRT. Please see the electronic medical record for details of the dose volume histograms. I prescribing 5000 cGy in 25 sessions utilizing 6 MV photons. This be followed by electron beam boost for a further 1600 cGy in 8 sessions. She'll undergo daily MV CT, setting up to her chest wall and breast on a daily basis.

## 2012-04-07 ENCOUNTER — Ambulatory Visit: Payer: Medicare Other

## 2012-04-10 ENCOUNTER — Ambulatory Visit
Admission: RE | Admit: 2012-04-10 | Discharge: 2012-04-10 | Disposition: A | Discharge status: Home / Self Care | Payer: Medicare Other | Source: Ambulatory Visit | Attending: Radiation Oncology | Admitting: Radiation Oncology

## 2012-04-10 ENCOUNTER — Encounter: Payer: Self-pay | Admitting: Radiation Oncology

## 2012-04-10 ENCOUNTER — Ambulatory Visit: Payer: Medicare Other

## 2012-04-10 ENCOUNTER — Ambulatory Visit: Admission: RE | Admit: 2012-04-10 | Payer: Medicare Other | Source: Ambulatory Visit | Admitting: Radiation Oncology

## 2012-04-10 VITALS — BP 165/74 | HR 51 | Temp 97.8°F | Resp 20 | Wt 155.1 lb

## 2012-04-10 DIAGNOSIS — C50919 Malignant neoplasm of unspecified site of unspecified female breast: Secondary | ICD-10-CM

## 2012-04-10 DIAGNOSIS — C50219 Malignant neoplasm of upper-inner quadrant of unspecified female breast: Secondary | ICD-10-CM

## 2012-04-10 MED ORDER — RADIAPLEXRX EX GEL
Freq: Once | CUTANEOUS | Status: AC
  Administered 2012-04-10: 15:00:00 via TOPICAL

## 2012-04-10 MED ORDER — ALRA NON-METALLIC DEODORANT (RAD-ONC)
1.0000 "application " | Freq: Once | TOPICAL | Status: AC
  Administered 2012-04-10: 1 via TOPICAL

## 2012-04-10 NOTE — Progress Notes (Signed)
Patient  Here for weekly rad txs to be her first of 1 today, post sim teaching, radiaplex gel, alra deodorant given to patient with instructions to use after rad tx and bedtime, not 3-4 hours prior to treatments, no c/o pain, some tenderness in nipple area of left breast,meds updated, teach back given, flyer shet on skin products also given eto patient 2:31 PM

## 2012-04-10 NOTE — Progress Notes (Signed)
Tomotherapy segmentation note: [name] underwent Tomotherapy segmentation today at the start of her IMRT in the management of her carcinoma of the left breast. She is being treated to 5.1 delivered field widths corresponding to one set of IMRT treatment devices [phone]).

## 2012-04-10 NOTE — Progress Notes (Signed)
Weekly Management Note:  Site: Left Current Dose:  200  cGy Projected Dose: 5000  cGy followed by electron beam boost  Narrative: The patient is seen today for routine under treatment assessment. CBCT/MVCT images/port films were reviewed. The chart was reviewed.   She is running behind today and I will check her MV CT later today. No complaints today. She had patient education today.  Physical Examination:  Filed Vitals:   04/10/12 1427  BP: 165/74  Pulse: 51  Temp: 97.8 F (36.6 C)  Resp: 20  .  Weight: 155 lb 1.6 oz (70.353 kg). Her wound is well-healed. No skin changes.  Impression: Tolerating radiation therapy well.  Plan: Continue radiation therapy as planned.

## 2012-04-11 ENCOUNTER — Ambulatory Visit: Payer: Medicare Other

## 2012-04-11 ENCOUNTER — Ambulatory Visit
Admission: RE | Admit: 2012-04-11 | Discharge: 2012-04-11 | Disposition: A | Discharge status: Home / Self Care | Payer: Medicare Other | Source: Ambulatory Visit | Attending: Radiation Oncology | Admitting: Radiation Oncology

## 2012-04-11 ENCOUNTER — Other Ambulatory Visit: Payer: Self-pay | Admitting: Oncology

## 2012-04-11 DIAGNOSIS — C50919 Malignant neoplasm of unspecified site of unspecified female breast: Secondary | ICD-10-CM

## 2012-04-12 ENCOUNTER — Ambulatory Visit
Admission: RE | Admit: 2012-04-12 | Discharge: 2012-04-12 | Disposition: A | Discharge status: Home / Self Care | Payer: Medicare Other | Source: Ambulatory Visit | Attending: Radiation Oncology | Admitting: Radiation Oncology

## 2012-04-12 ENCOUNTER — Ambulatory Visit: Payer: Medicare Other

## 2012-04-13 ENCOUNTER — Other Ambulatory Visit: Payer: Self-pay | Admitting: *Deleted

## 2012-04-13 ENCOUNTER — Ambulatory Visit
Admission: RE | Admit: 2012-04-13 | Discharge: 2012-04-13 | Disposition: A | Discharge status: Home / Self Care | Payer: Medicare Other | Source: Ambulatory Visit | Attending: Radiation Oncology | Admitting: Radiation Oncology

## 2012-04-13 ENCOUNTER — Ambulatory Visit: Payer: Medicare Other

## 2012-04-13 NOTE — Telephone Encounter (Signed)
Faxed refill request.  Last filled 03/18/12.

## 2012-04-14 ENCOUNTER — Other Ambulatory Visit: Payer: Self-pay | Admitting: *Deleted

## 2012-04-14 ENCOUNTER — Ambulatory Visit: Payer: Medicare Other

## 2012-04-14 ENCOUNTER — Ambulatory Visit
Admission: RE | Admit: 2012-04-14 | Discharge: 2012-04-14 | Disposition: A | Discharge status: Home / Self Care | Payer: Medicare Other | Source: Ambulatory Visit | Attending: Radiation Oncology | Admitting: Radiation Oncology

## 2012-04-14 MED ORDER — LORAZEPAM 1 MG PO TABS: ORAL_TABLET | ORAL | Status: DC

## 2012-04-14 NOTE — Telephone Encounter (Signed)
Please call in

## 2012-04-14 NOTE — Telephone Encounter (Signed)
Medication phoned to pharmacy.  

## 2012-04-17 ENCOUNTER — Ambulatory Visit
Admission: RE | Admit: 2012-04-17 | Discharge: 2012-04-17 | Disposition: A | Discharge status: Home / Self Care | Payer: Medicare Other | Source: Ambulatory Visit | Attending: Radiation Oncology | Admitting: Radiation Oncology

## 2012-04-17 ENCOUNTER — Ambulatory Visit: Payer: Medicare Other

## 2012-04-17 ENCOUNTER — Encounter: Payer: Self-pay | Admitting: Radiation Oncology

## 2012-04-17 VITALS — Resp 18 | Wt 156.2 lb

## 2012-04-17 DIAGNOSIS — C50219 Malignant neoplasm of upper-inner quadrant of unspecified female breast: Secondary | ICD-10-CM

## 2012-04-17 NOTE — Progress Notes (Signed)
Weekly Management Note:  Site: Left breast Current Dose:  1200  cGy Projected Dose: 6600  cGy  Narrative: The patient is seen today for routine under treatment assessment. CBCT/MVCT images/port films were reviewed. The chart was reviewed. Registrations have been excellent. She seen today prior to her sixth treatment to her left breast. She is without complaints today. She uses Radioplex gel. Physical Examination:  Filed Vitals:   04/17/12 1638  Resp: 18  .  Weight: 156 lb 3.2 oz (70.852 kg). There is faint erythema along the medial aspect of the left breast, otherwise no skin changes.  Impression: Tolerating radiation therapy well.  Plan: Continue radiation therapy as planned.

## 2012-04-17 NOTE — Progress Notes (Signed)
Patient presents to the clinic today accompanied by her daughter for a PUT with Dr. Dayton Scrape. Patient alert and oriented to person, place, and time. No distress noted. Steady gait noted. Pleasant affect noted. Patient denies pain at this time. Patient denies skin changes to left/treated breast. Patient reports using Radiaplex gel on left/treated breast as directed. Patient has no complaints at this time. Patient denies fatigue. Reported all findings to Dr. Dayton Scrape.

## 2012-04-18 ENCOUNTER — Ambulatory Visit: Payer: Medicare Other

## 2012-04-18 ENCOUNTER — Ambulatory Visit
Admission: RE | Admit: 2012-04-18 | Discharge: 2012-04-18 | Disposition: A | Discharge status: Home / Self Care | Payer: Medicare Other | Source: Ambulatory Visit | Attending: Radiation Oncology | Admitting: Radiation Oncology

## 2012-04-19 ENCOUNTER — Ambulatory Visit
Admission: RE | Admit: 2012-04-19 | Discharge: 2012-04-19 | Disposition: A | Discharge status: Home / Self Care | Payer: Medicare Other | Source: Ambulatory Visit | Attending: Radiation Oncology | Admitting: Radiation Oncology

## 2012-04-19 ENCOUNTER — Ambulatory Visit: Payer: Medicare Other

## 2012-04-20 ENCOUNTER — Ambulatory Visit
Admission: RE | Admit: 2012-04-20 | Discharge: 2012-04-20 | Disposition: A | Discharge status: Home / Self Care | Payer: Medicare Other | Source: Ambulatory Visit | Attending: Radiation Oncology | Admitting: Radiation Oncology

## 2012-04-20 ENCOUNTER — Ambulatory Visit: Payer: Medicare Other

## 2012-04-21 ENCOUNTER — Ambulatory Visit: Payer: Medicare Other

## 2012-04-21 ENCOUNTER — Ambulatory Visit
Admission: RE | Admit: 2012-04-21 | Discharge: 2012-04-21 | Disposition: A | Discharge status: Home / Self Care | Payer: Medicare Other | Source: Ambulatory Visit | Attending: Radiation Oncology | Admitting: Radiation Oncology

## 2012-04-24 ENCOUNTER — Ambulatory Visit
Admission: RE | Admit: 2012-04-24 | Discharge: 2012-04-24 | Disposition: A | Discharge status: Home / Self Care | Payer: Medicare Other | Source: Ambulatory Visit | Attending: Radiation Oncology | Admitting: Radiation Oncology

## 2012-04-24 ENCOUNTER — Encounter: Payer: Self-pay | Admitting: Radiation Oncology

## 2012-04-24 VITALS — BP 166/71 | HR 69 | Temp 98.1°F | Resp 20 | Wt 155.3 lb

## 2012-04-24 DIAGNOSIS — C50919 Malignant neoplasm of unspecified site of unspecified female breast: Secondary | ICD-10-CM

## 2012-04-24 NOTE — Progress Notes (Signed)
Weekly Management Note:  Site: Left breast Current Dose:  2200  cGy Projected Dose: 6600  cGy  Narrative: The patient is seen today for routine under treatment assessment. CBCT/MVCT images/port films were reviewed. The chart was reviewed.   She does report slight discomfort along her inverted left nipple. She uses Radioplex gel.  Physical Examination:  Filed Vitals:   04/24/12 1410  BP: 166/71  Pulse: 69  Temp: 98.1 F (36.7 C)  Resp: 20  .  Weight: 155 lb 4.8 oz (70.444 kg). There is faint erythema along the skin of the left breast. The left nipple remains inverted. It is slightly tender to palpation.  Impression: Tolerating radiation therapy well. I would not expect left nipple pain at this dose, but I suggested that she take ibuprofen when necessary. There is no obvious infection.  Plan: Continue radiation therapy as planned.

## 2012-04-24 NOTE — Progress Notes (Signed)
Patient here for weekly rad txs: left breast 11/25 completed , over the weekend  inverted  nipple left breast was aching  At times, slight pain, no meds taken, erythema only, skin intact, using radiaplex gel bid 2:15 PM

## 2012-04-25 ENCOUNTER — Ambulatory Visit
Admission: RE | Admit: 2012-04-25 | Discharge: 2012-04-25 | Disposition: A | Discharge status: Home / Self Care | Payer: Medicare Other | Source: Ambulatory Visit | Attending: Radiation Oncology | Admitting: Radiation Oncology

## 2012-04-26 ENCOUNTER — Ambulatory Visit
Admission: RE | Admit: 2012-04-26 | Discharge: 2012-04-26 | Disposition: A | Discharge status: Home / Self Care | Payer: Medicare Other | Source: Ambulatory Visit | Attending: Radiation Oncology | Admitting: Radiation Oncology

## 2012-04-27 ENCOUNTER — Ambulatory Visit
Admission: RE | Admit: 2012-04-27 | Discharge: 2012-04-27 | Disposition: A | Discharge status: Home / Self Care | Payer: Medicare Other | Source: Ambulatory Visit | Attending: Radiation Oncology | Admitting: Radiation Oncology

## 2012-04-28 ENCOUNTER — Ambulatory Visit
Admission: RE | Admit: 2012-04-28 | Discharge: 2012-04-28 | Disposition: A | Discharge status: Home / Self Care | Payer: Medicare Other | Source: Ambulatory Visit | Attending: Radiation Oncology | Admitting: Radiation Oncology

## 2012-05-01 ENCOUNTER — Ambulatory Visit
Admission: RE | Admit: 2012-05-01 | Discharge: 2012-05-01 | Disposition: A | Discharge status: Home / Self Care | Payer: Medicare Other | Source: Ambulatory Visit | Attending: Radiation Oncology | Admitting: Radiation Oncology

## 2012-05-01 ENCOUNTER — Encounter: Payer: Self-pay | Admitting: Radiation Oncology

## 2012-05-01 VITALS — BP 136/81 | HR 68 | Temp 97.4°F | Resp 20 | Wt 153.1 lb

## 2012-05-01 DIAGNOSIS — C50919 Malignant neoplasm of unspecified site of unspecified female breast: Secondary | ICD-10-CM

## 2012-05-01 NOTE — Progress Notes (Signed)
Patient here weekly rad txs:16/33 completed, very slight erythema on left breast, has inverted nipple,occasional twinges in the breast, soreness also, using radiaplex gel bid 2:37 PM

## 2012-05-01 NOTE — Progress Notes (Signed)
Weekly Management Note:  Site: Left Current Dose:  3200  cGy Projected Dose: 6600  CGy including boost to the tumor bed.  Narrative: The patient is seen today for routine under treatment assessment. CBCT/MVCT images/port films were reviewed. The chart was reviewed.   No complaints today. She uses Radioplex gel.  Physical Examination:  Filed Vitals:   05/01/12 1436  BP: 136/81  Pulse: 68  Temp: 97.4 F (36.3 C)  Resp: 20  .  Weight: 153 lb 1.6 oz (69.446 kg). Faint erythema the skin along the left breast. No areas of desquamation.  Impression: Tolerating radiation therapy well.  Plan: Continue radiation therapy as planned.

## 2012-05-02 ENCOUNTER — Ambulatory Visit
Admission: RE | Admit: 2012-05-02 | Discharge: 2012-05-02 | Disposition: A | Discharge status: Home / Self Care | Payer: Medicare Other | Source: Ambulatory Visit | Attending: Radiation Oncology | Admitting: Radiation Oncology

## 2012-05-03 ENCOUNTER — Ambulatory Visit
Admission: RE | Admit: 2012-05-03 | Discharge: 2012-05-03 | Disposition: A | Discharge status: Home / Self Care | Payer: Medicare Other | Source: Ambulatory Visit | Attending: Radiation Oncology | Admitting: Radiation Oncology

## 2012-05-04 ENCOUNTER — Ambulatory Visit
Admission: RE | Admit: 2012-05-04 | Discharge: 2012-05-04 | Disposition: A | Discharge status: Home / Self Care | Payer: Medicare Other | Source: Ambulatory Visit | Attending: Radiation Oncology | Admitting: Radiation Oncology

## 2012-05-05 ENCOUNTER — Ambulatory Visit
Admission: RE | Admit: 2012-05-05 | Discharge: 2012-05-05 | Disposition: A | Discharge status: Home / Self Care | Payer: Medicare Other | Source: Ambulatory Visit | Attending: Radiation Oncology | Admitting: Radiation Oncology

## 2012-05-08 ENCOUNTER — Ambulatory Visit
Admission: RE | Admit: 2012-05-08 | Discharge: 2012-05-08 | Disposition: A | Discharge status: Home / Self Care | Payer: Medicare Other | Source: Ambulatory Visit | Attending: Radiation Oncology | Admitting: Radiation Oncology

## 2012-05-08 ENCOUNTER — Encounter: Payer: Self-pay | Admitting: Radiation Oncology

## 2012-05-08 VITALS — BP 164/72 | HR 56 | Temp 97.6°F | Resp 20 | Wt 156.7 lb

## 2012-05-08 DIAGNOSIS — C50919 Malignant neoplasm of unspecified site of unspecified female breast: Secondary | ICD-10-CM

## 2012-05-08 NOTE — Progress Notes (Signed)
Patient here weekly rad txs>:left breast,21/33 completed, dermatitis on top of chest near breast, erythema skin intact, patient "c/o pain in breast especially when waking up in am,breast feels heavy like its going to fall off" energy level mostly good, tired at times, uses radiaplex gel bid2:05 PM

## 2012-05-08 NOTE — Progress Notes (Signed)
Weekly Management Note:  Site: Left breast Current Dose:  4200  cGy Projected Dose: 6600  CGy including left breast tumor bed boost  Narrative: The patient is seen today for routine under treatment assessment. CBCT/MVCT images/port films were reviewed. The chart was reviewed.   She feels left breast heaviness, but no significant discomfort. She uses Radioplex gel.  Physical Examination:  Filed Vitals:   05/08/12 1401  BP: 164/72  Pulse: 56  Temp: 97.6 F (36.4 C)  Resp: 20  .  Weight: 156 lb 11.2 oz (71.079 kg). There is moderate erythema of the skin along the left breast as expected. No areas of dry desquamation. No change in inverted nipples.  Impression: Tolerating radiation therapy well.  Plan: Continue radiation therapy as planned.

## 2012-05-09 ENCOUNTER — Ambulatory Visit
Admission: RE | Admit: 2012-05-09 | Discharge: 2012-05-09 | Disposition: A | Discharge status: Home / Self Care | Payer: Medicare Other | Source: Ambulatory Visit | Attending: Radiation Oncology | Admitting: Radiation Oncology

## 2012-05-10 ENCOUNTER — Ambulatory Visit
Admission: RE | Admit: 2012-05-10 | Discharge: 2012-05-10 | Disposition: A | Discharge status: Home / Self Care | Payer: Medicare Other | Source: Ambulatory Visit | Attending: Radiation Oncology | Admitting: Radiation Oncology

## 2012-05-12 IMAGING — DX DG FEMUR 2+V PORT*R*
1 series · 1 of 1 positions shown · non-contrast
Comparison: None.

CLINICAL DATA: Right femoral fracture.

PORTABLE RIGHT FEMUR - 2 VIEW

[AP]
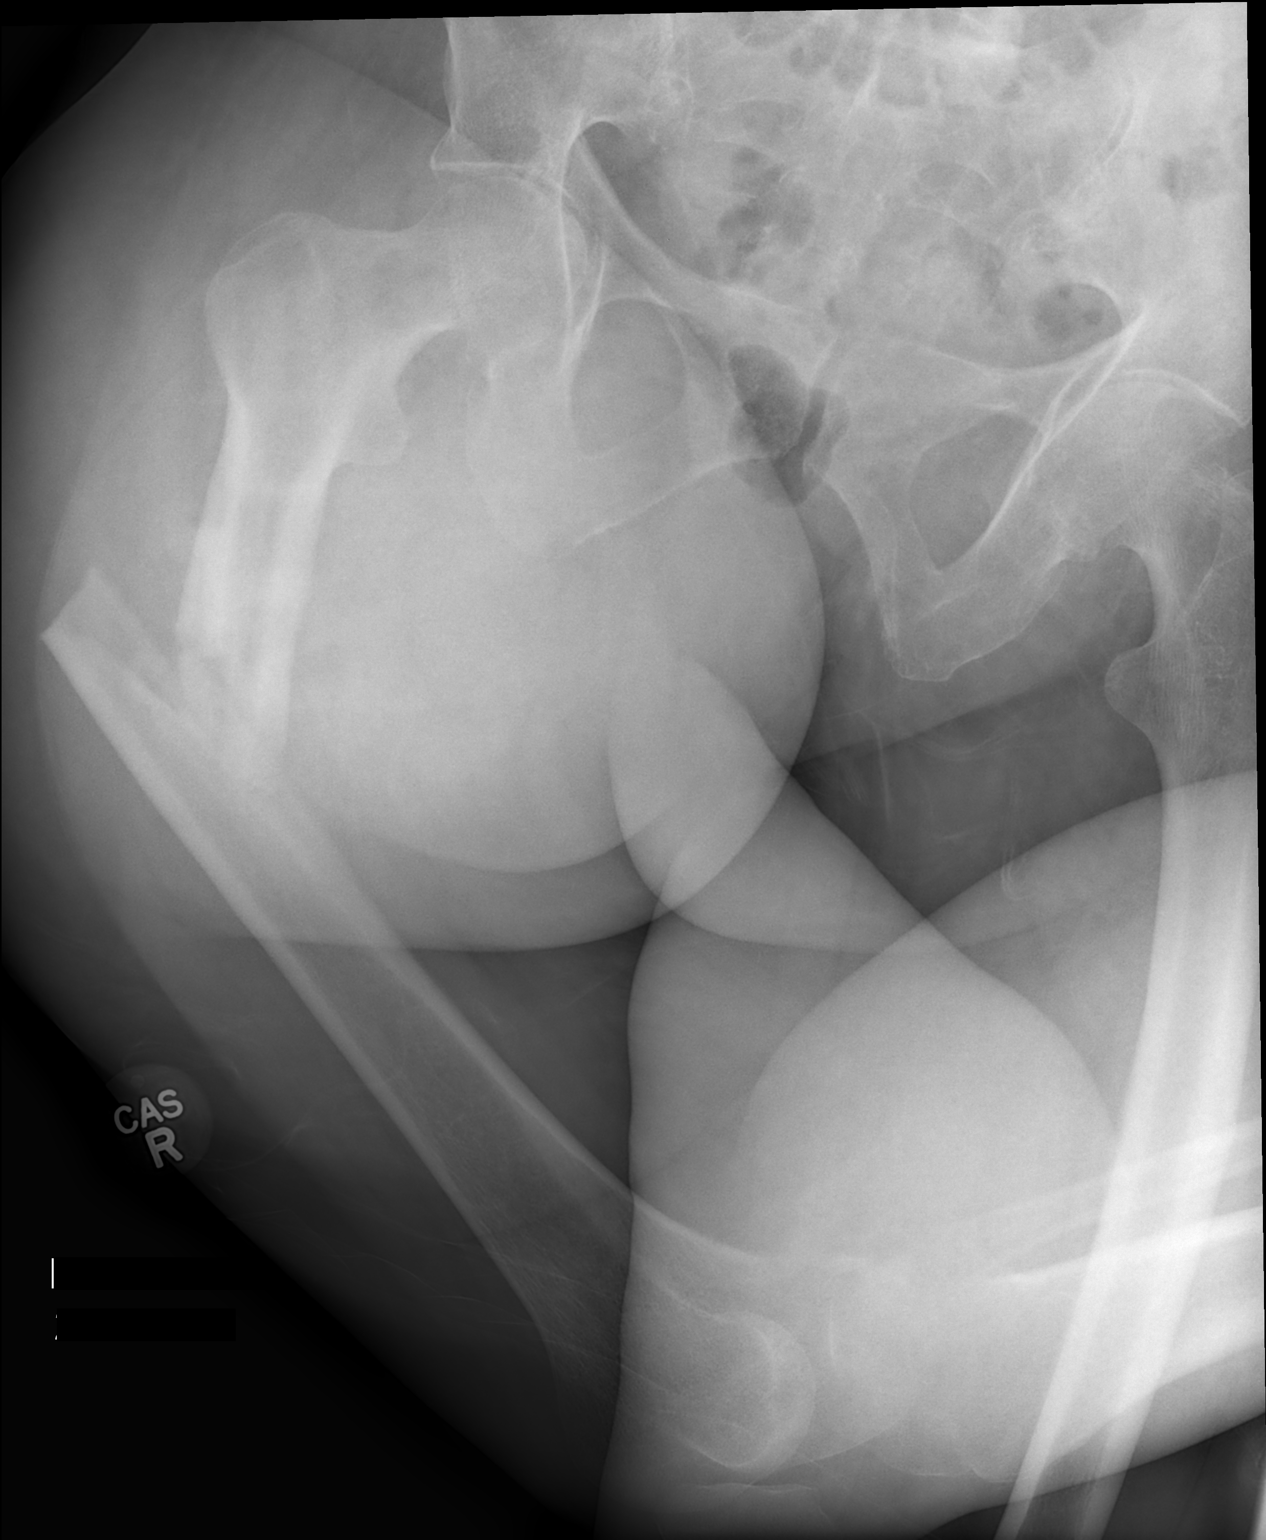

[1 of 1 positions shown; findings below may reference images not displayed]

FINDINGS: Severely angulated and displaced fracture noted of the
femoral shaft near proximal and mid third junction.  No soft tissue
air.
IMPRESSION: Severely angulated fracture of the right femoral shaft.

## 2012-05-12 IMAGING — DX DG CHEST 1V PORT
1 series · 1 of 1 positions shown · non-contrast
Comparison: None.

CLINICAL DATA: Right femoral fracture.

PORTABLE CHEST - 1 VIEW

[AP]
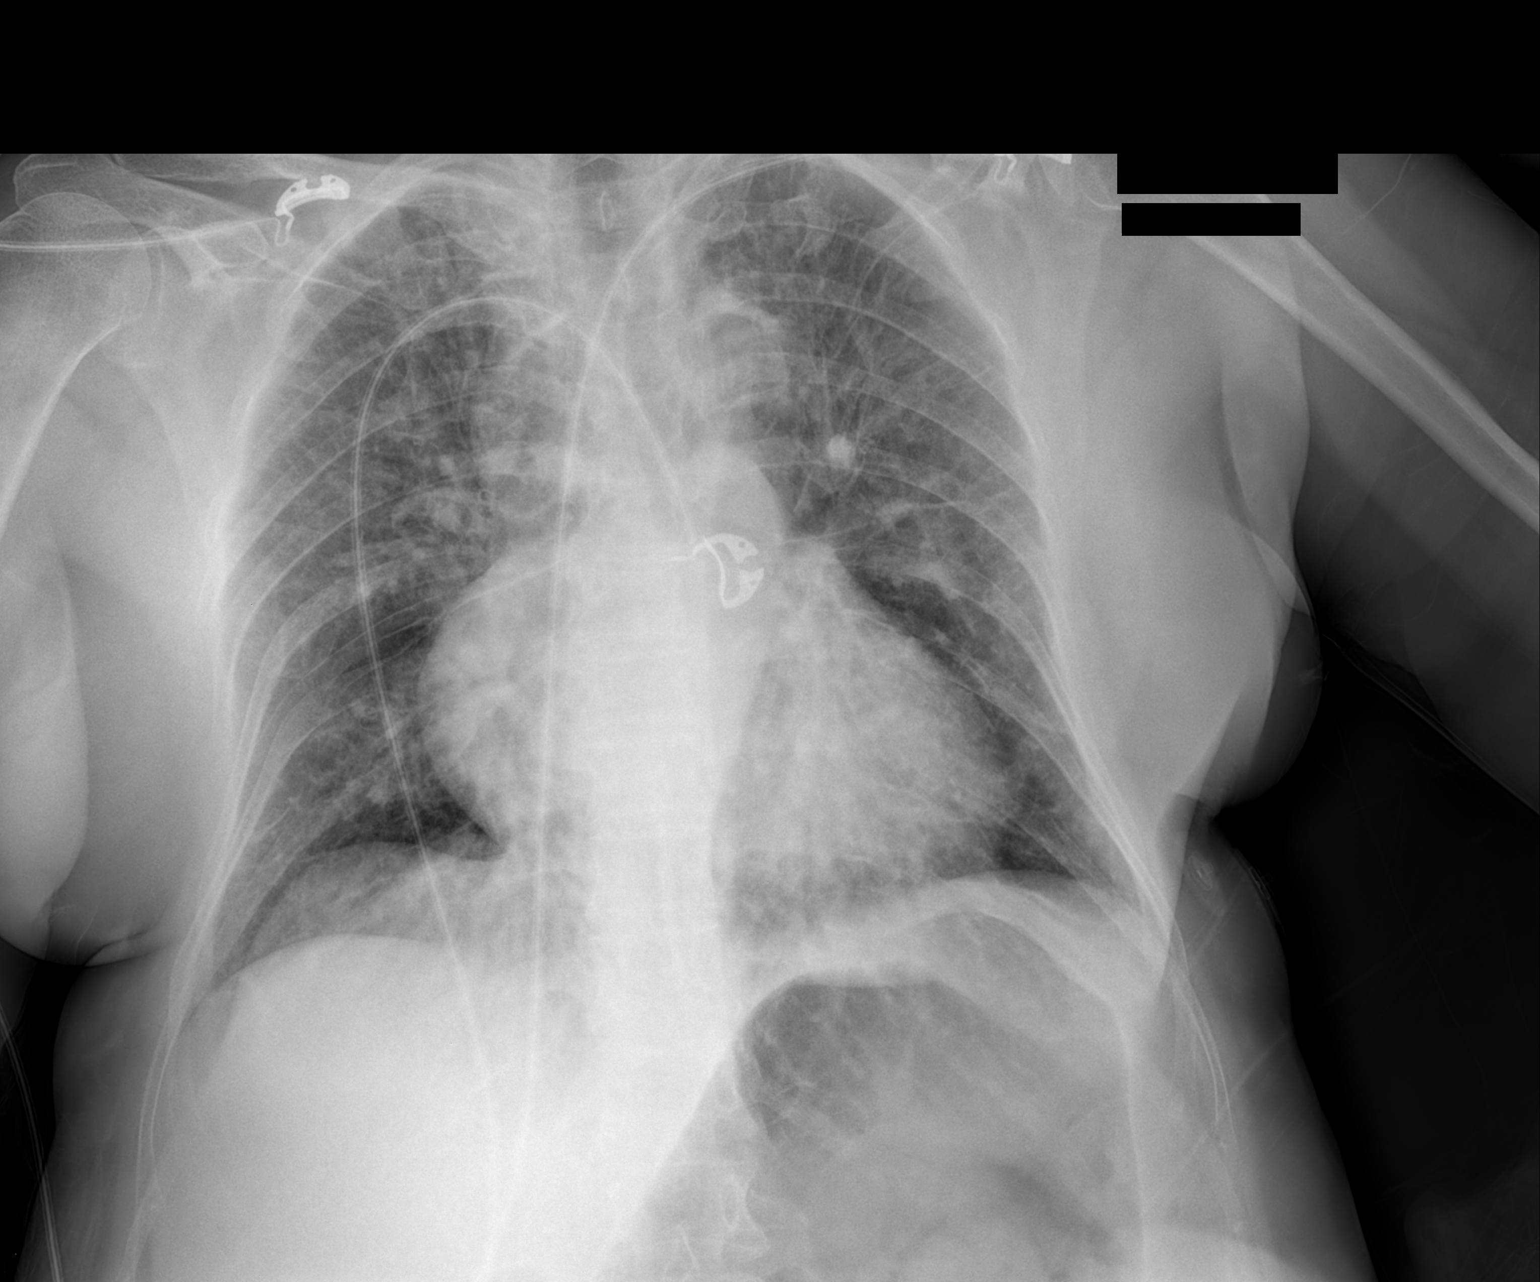

[1 of 1 positions shown; findings below may reference images not displayed]

FINDINGS: There is evidence of probable moderate congestive heart
failure.  Heart is mildly enlarged.  No pleural effusions.
IMPRESSION: Moderate CHF.

## 2012-05-13 IMAGING — CR DG FEMUR 2+V PORT*R*
1 series · 4 of 4 positions shown · non-contrast
Comparison: Films earlier today.

CLINICAL DATA: Post traction of right femur.

PORTABLE RIGHT FEMUR - 2 VIEW

[Series 1: AP · right · 4 of 4 slices shown]
[im 1/4]
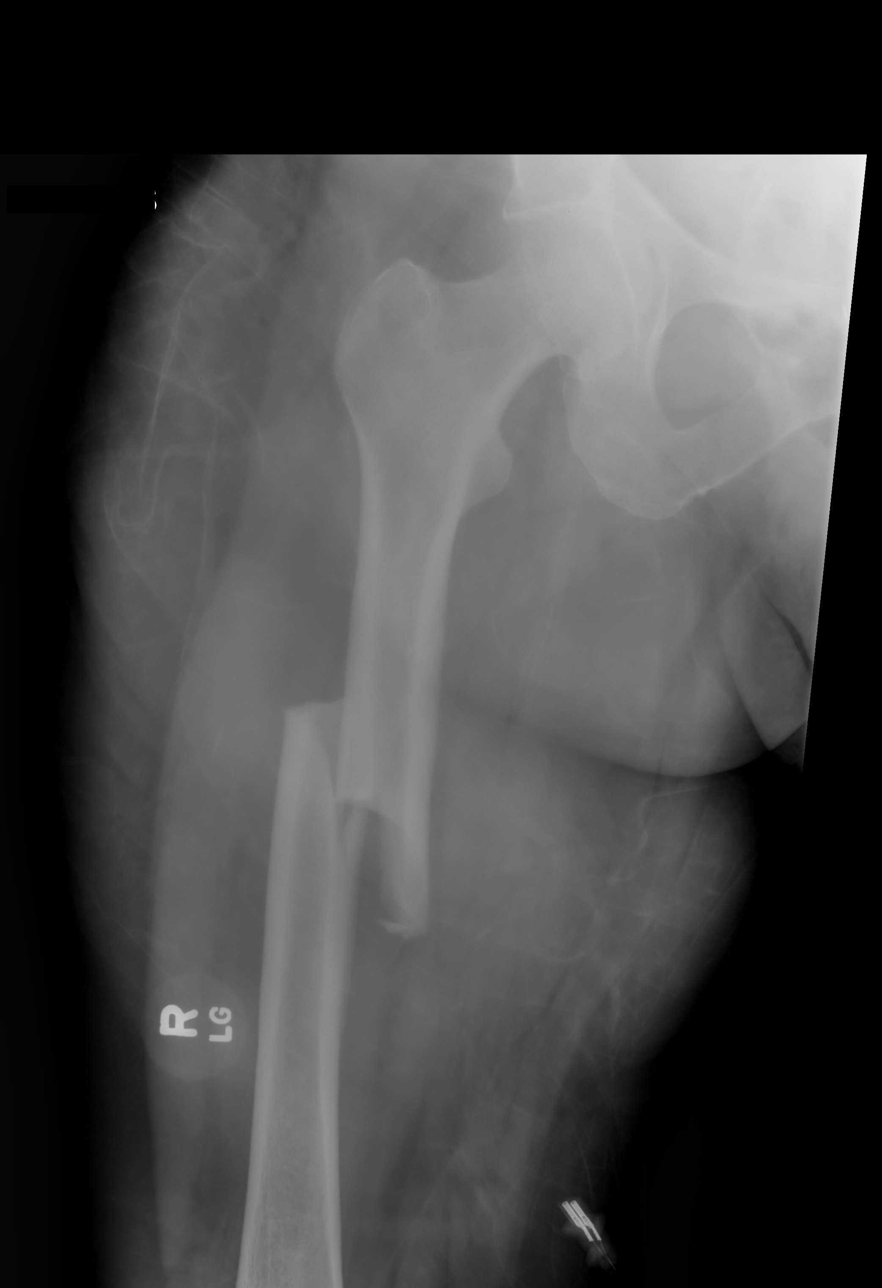
[im 2/4]
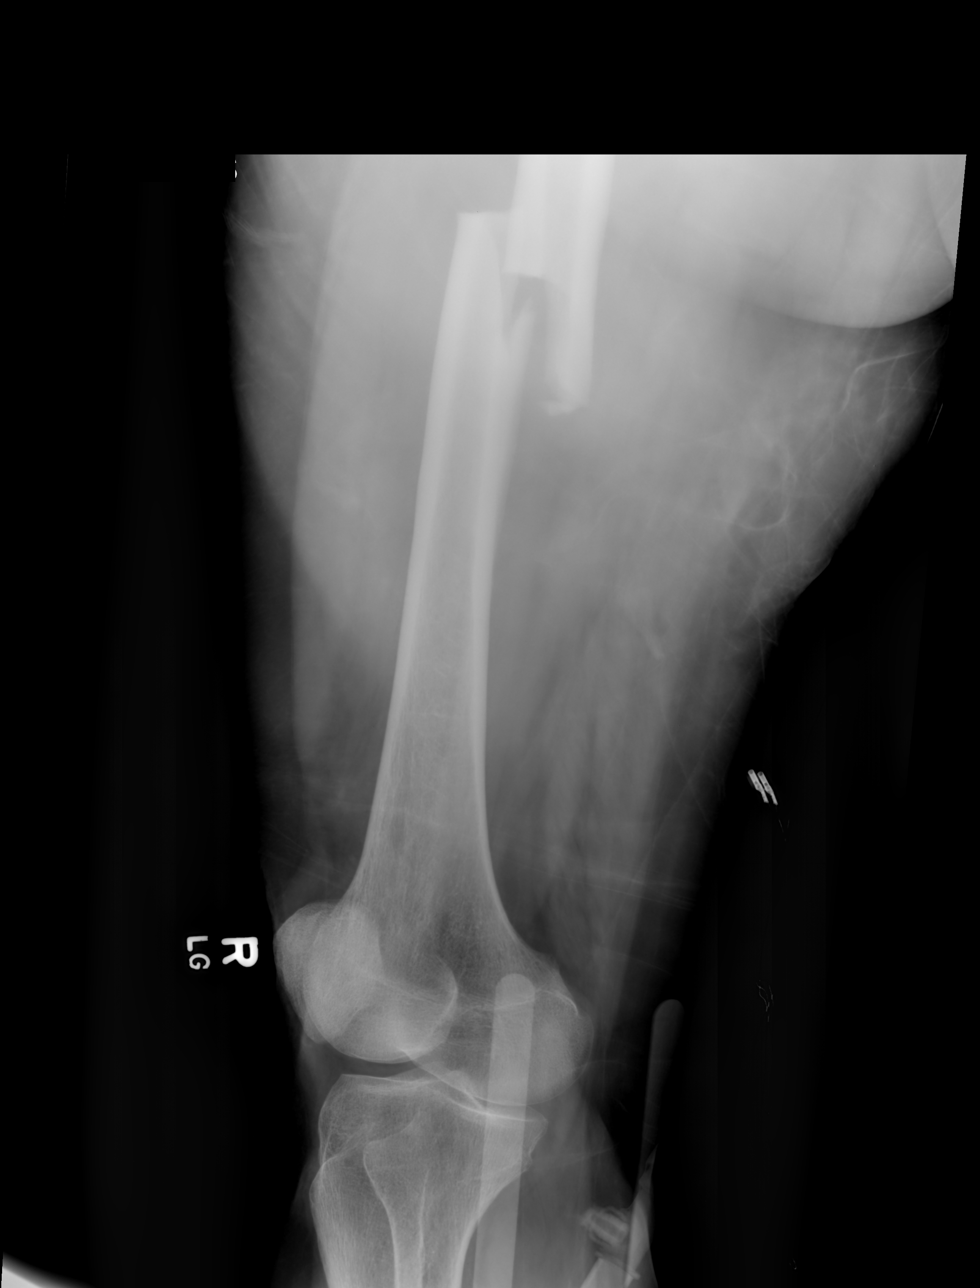
[im 3/4]
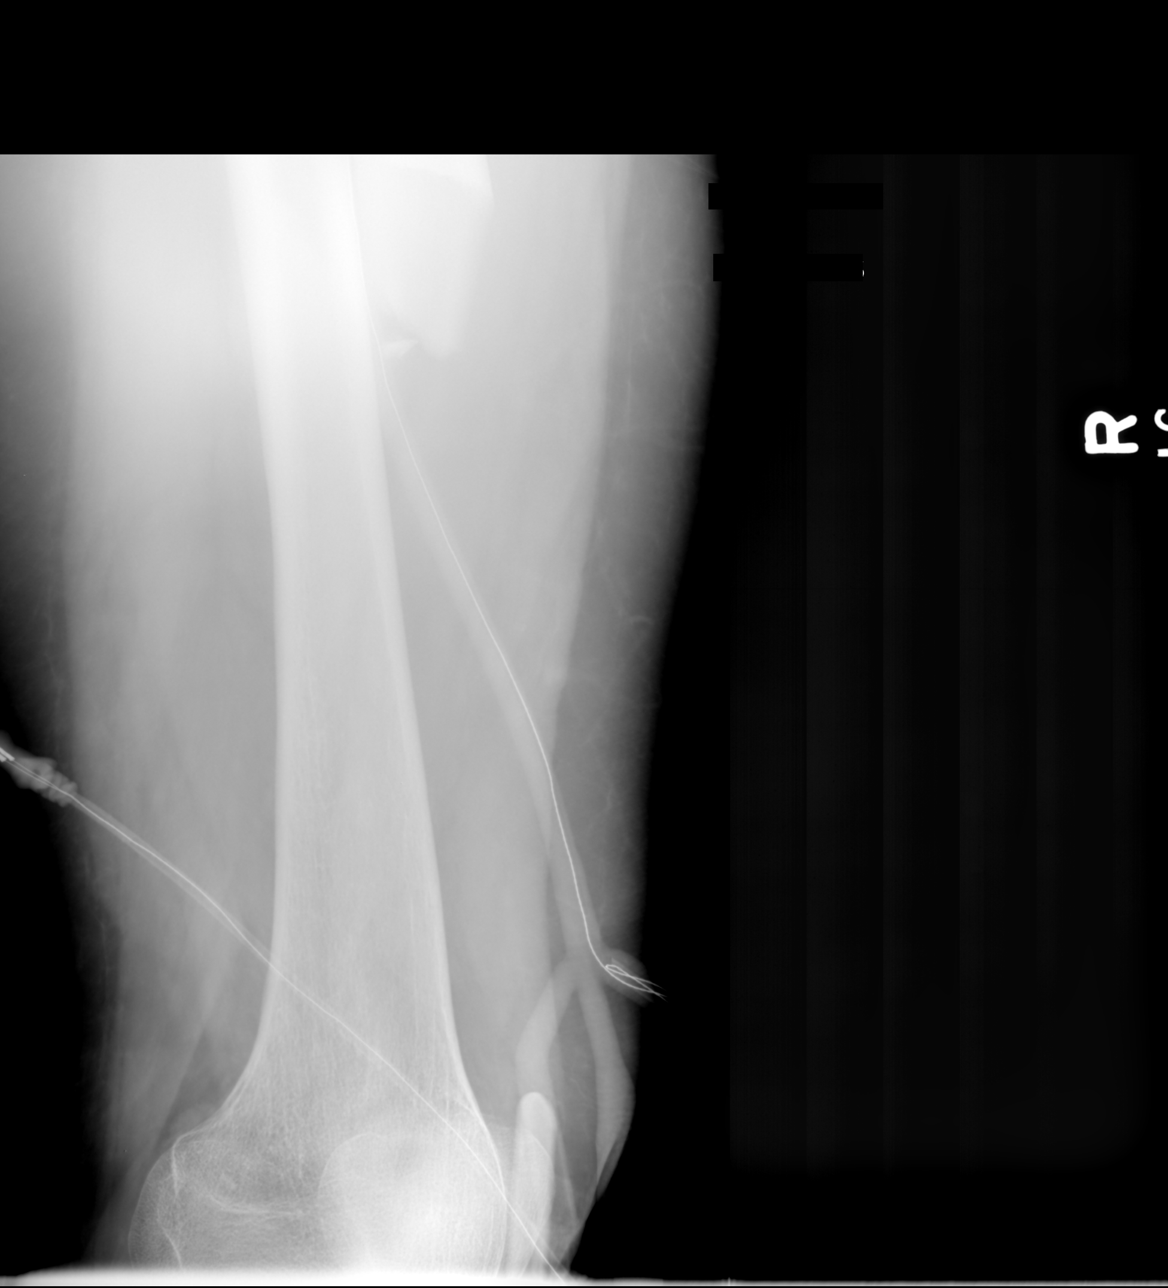
[im 4/4]
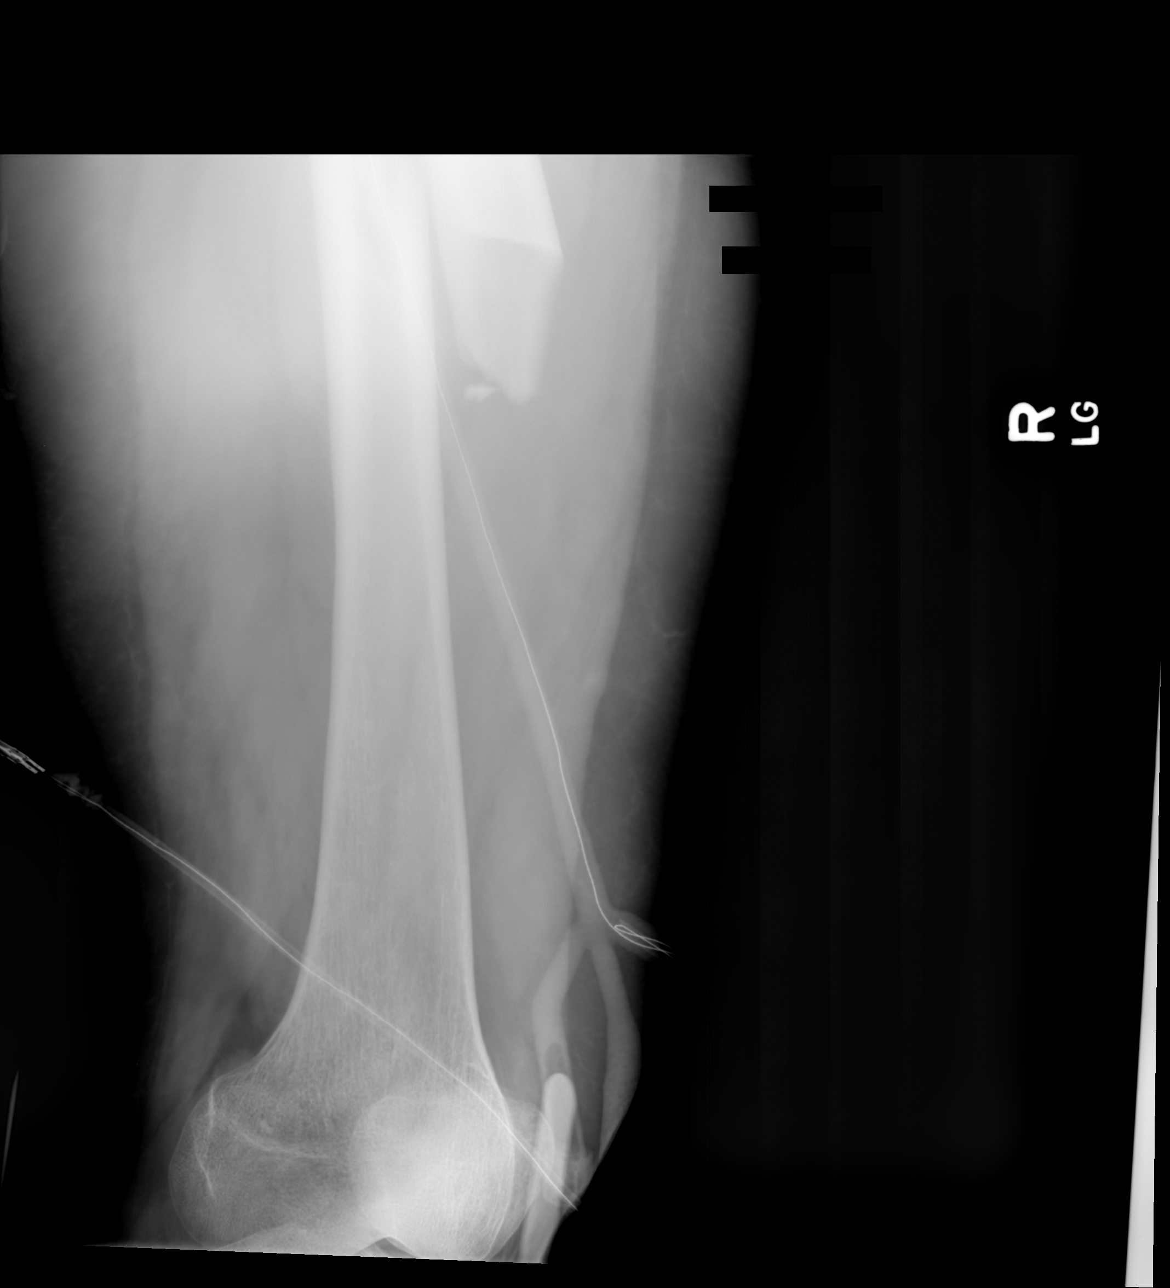

[4 of 4 positions shown; findings below may reference images not displayed]

FINDINGS: Significant improvement in angulation and displacement at
the level of the femoral shaft fracture compared to the previous
film.  There is roughly half to full shaft width displacement of
the distal femoral shaft posterolaterally compared to the proximal
femur.
IMPRESSION: Improved alignment of the femoral fracture in traction.

## 2012-05-13 IMAGING — CR DG FEMUR 2+V PORT*R*
1 series · 4 of 4 positions shown · non-contrast
Comparison: 10/16/2010

CLINICAL DATA: Postop IM nail.

PORTABLE RIGHT FEMUR - 2 VIEW

[Series 1: AP · right · 4 of 4 slices shown]
[im 1/4]
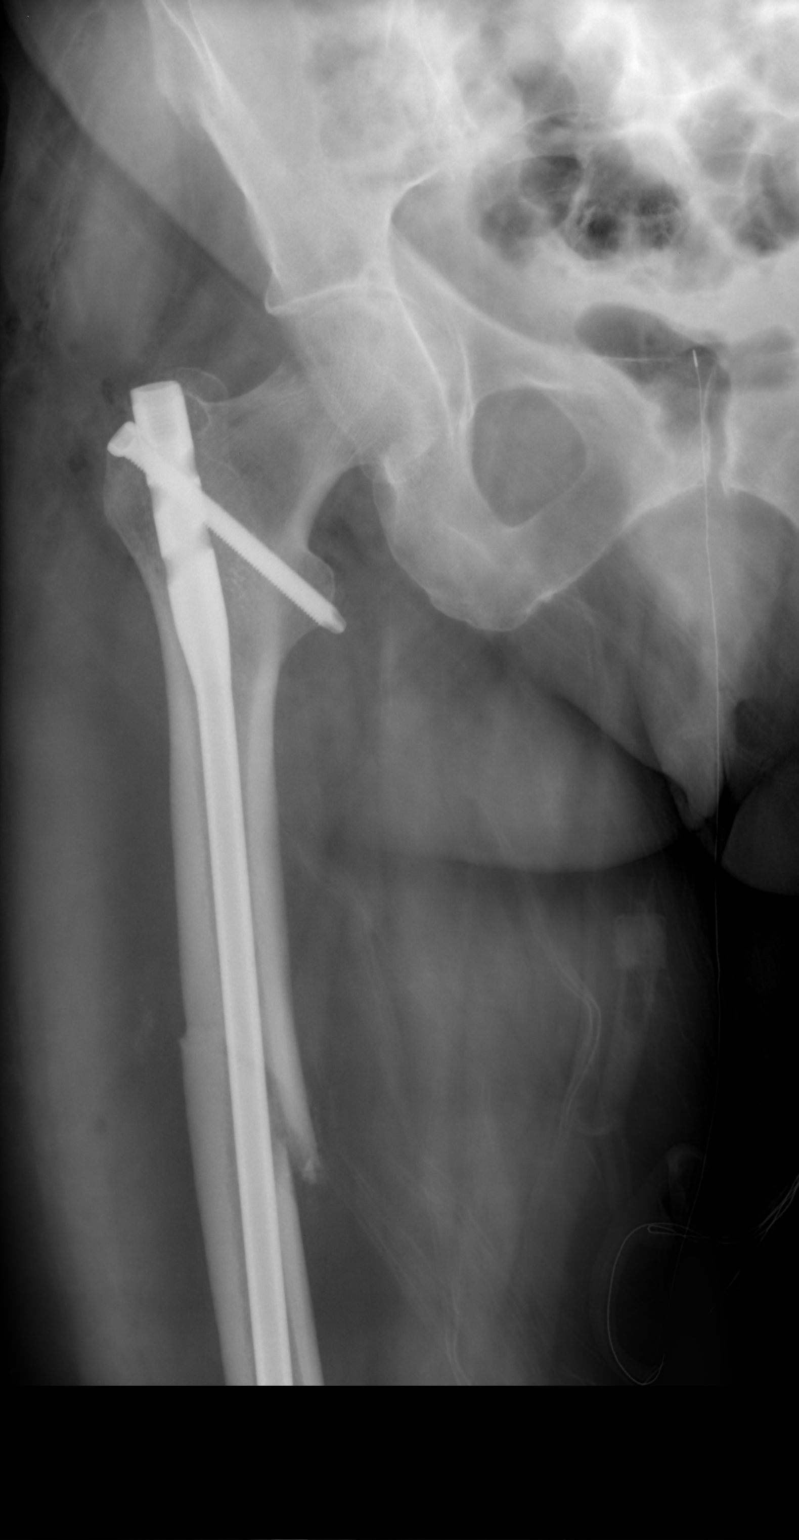
[im 2/4]
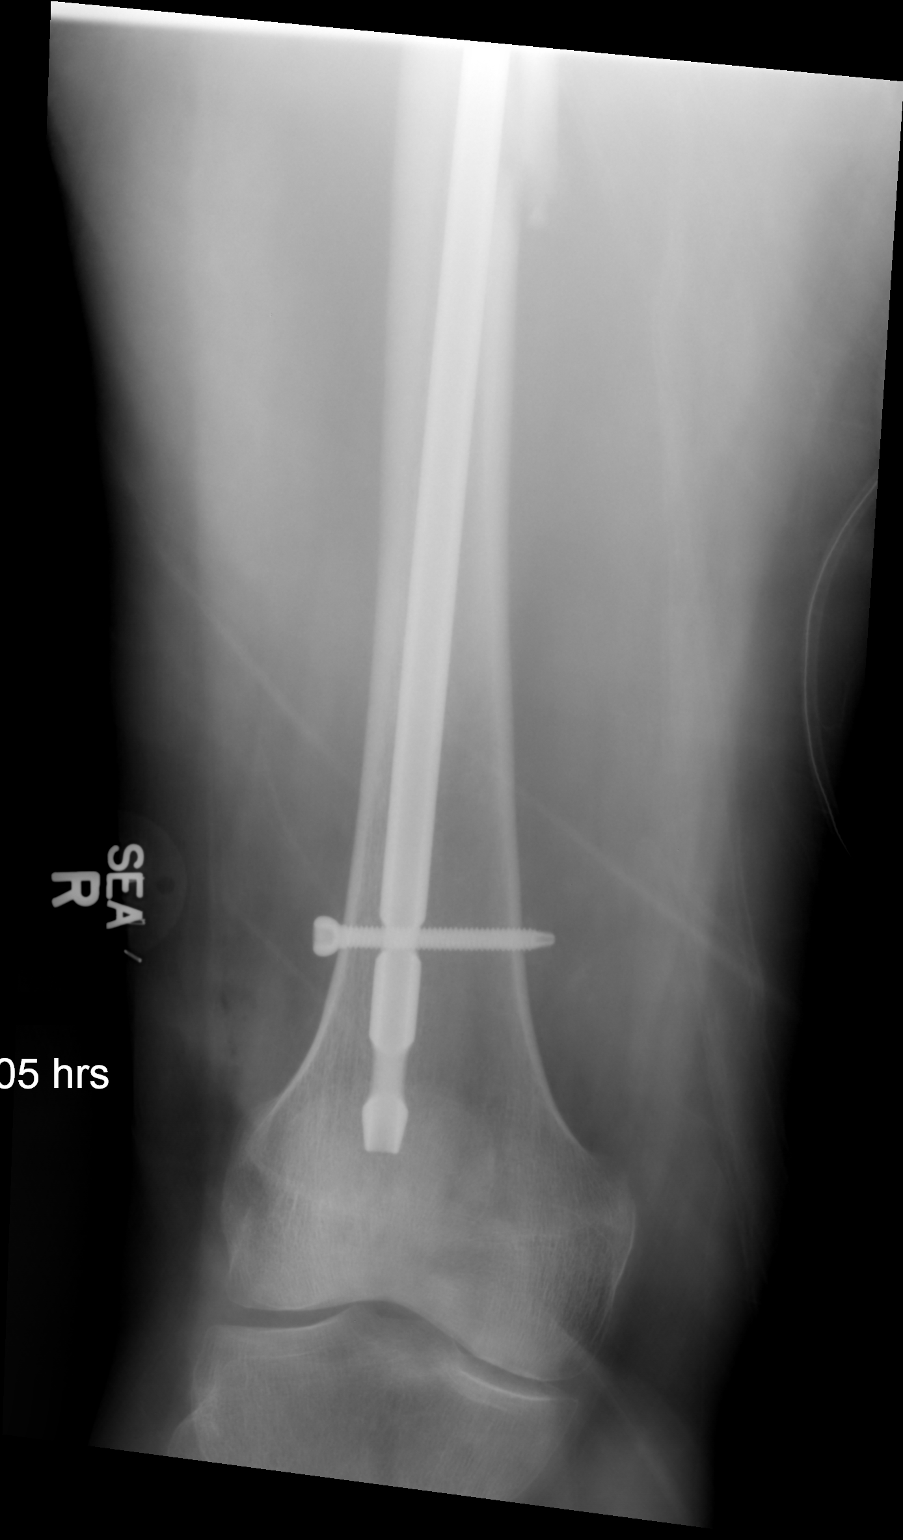
[im 3/4]
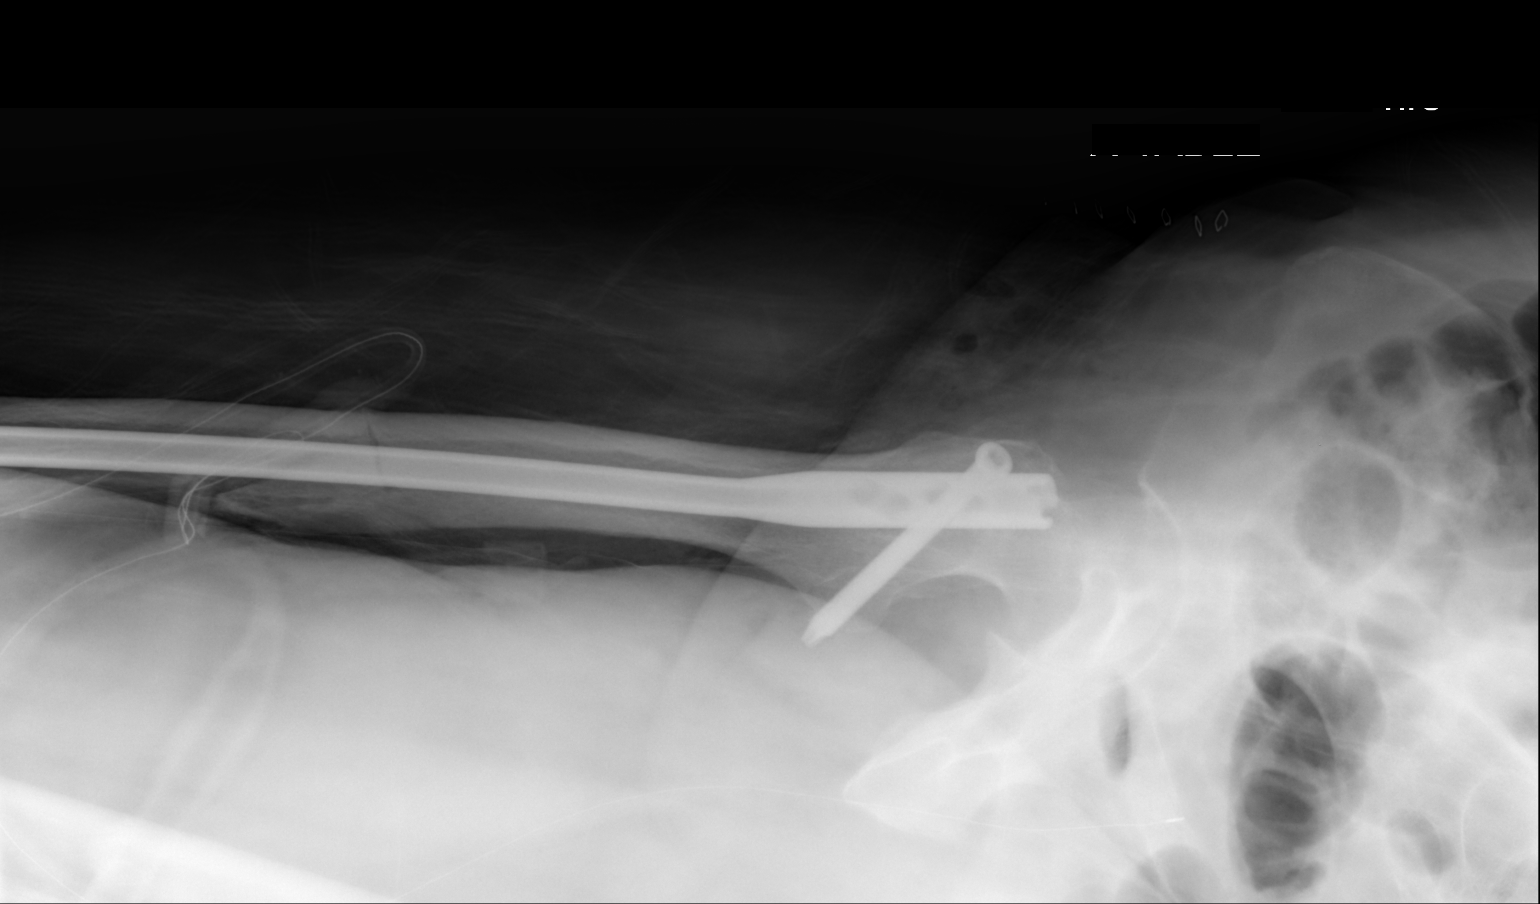
[im 4/4]
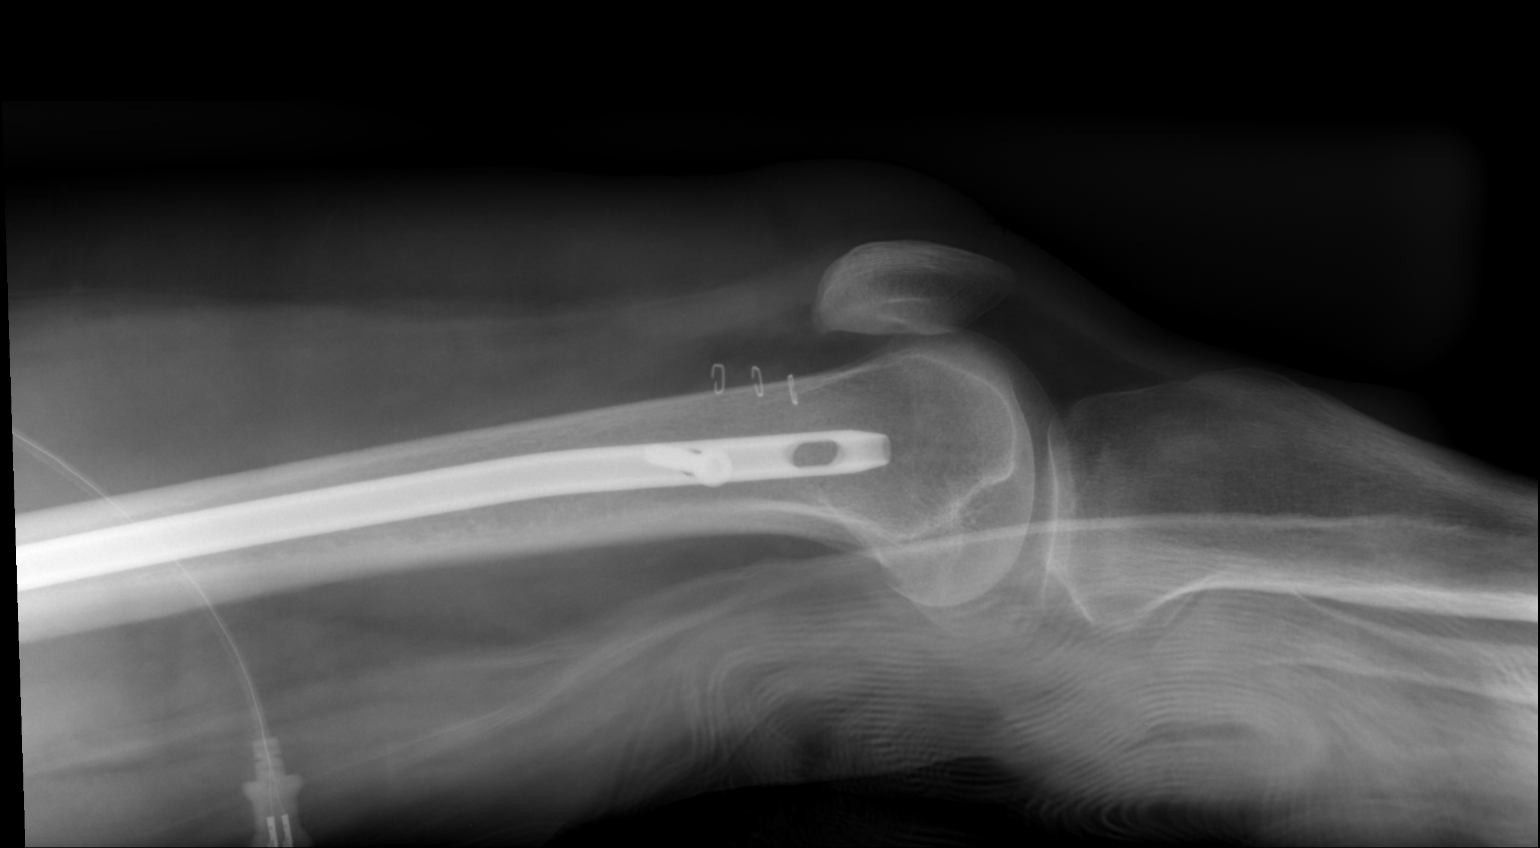

[4 of 4 positions shown; findings below may reference images not displayed]

FINDINGS: The patient has undergone placement of intramedullary
nail with proximal and distal screws. There has been reduction of
oblique fracture involving the mid femur.  The alignment is near
anatomic.
IMPRESSION: Status post ORIF mid femur fracture.  No adverse features.

## 2012-05-13 IMAGING — RF DG FEMUR 2+V*R*
1 series · 5 of 5 positions shown · non-contrast
Comparison: 10/16/2010

CLINICAL DATA: Right femur fracture.  Internal fixation.

RIGHT FEMUR - 2 VIEW

[Series 1: run · 5 of 5 slices shown]
[im 1/5]
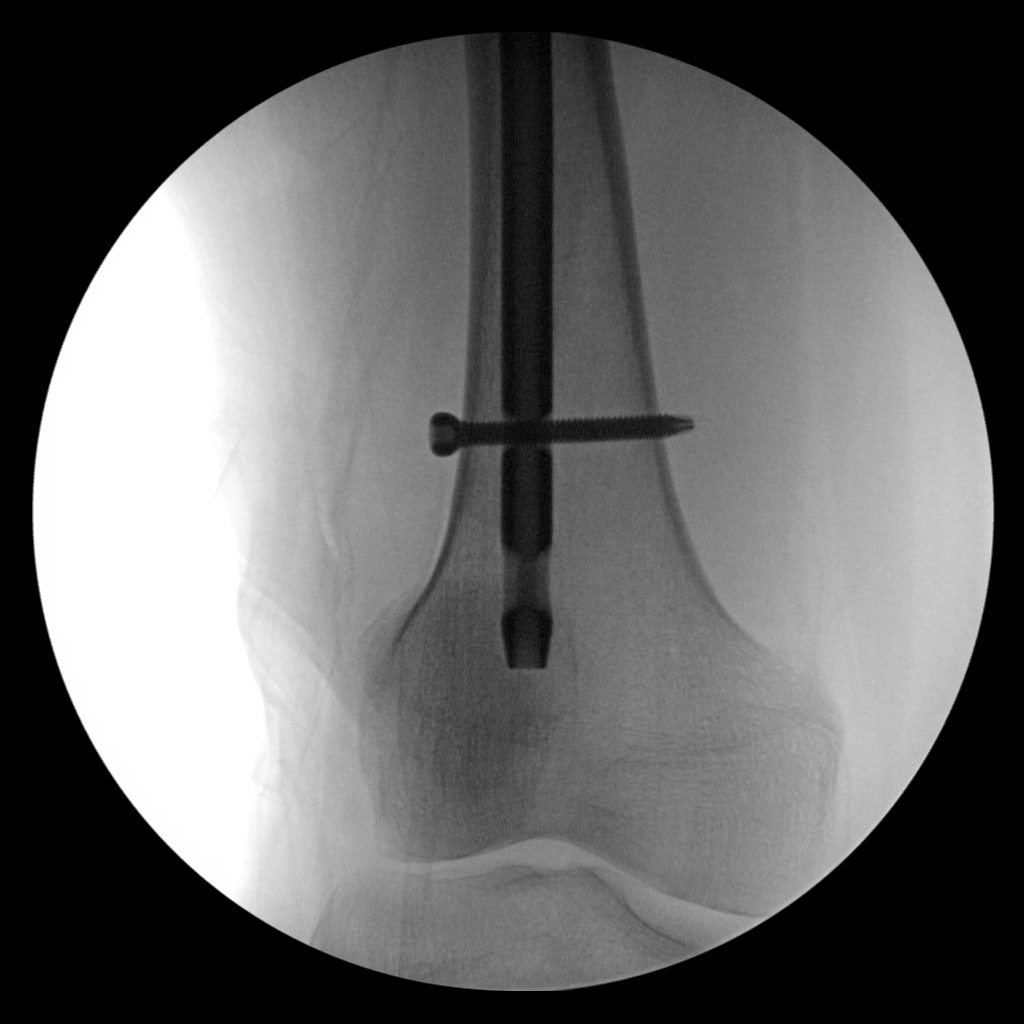
[im 2/5]
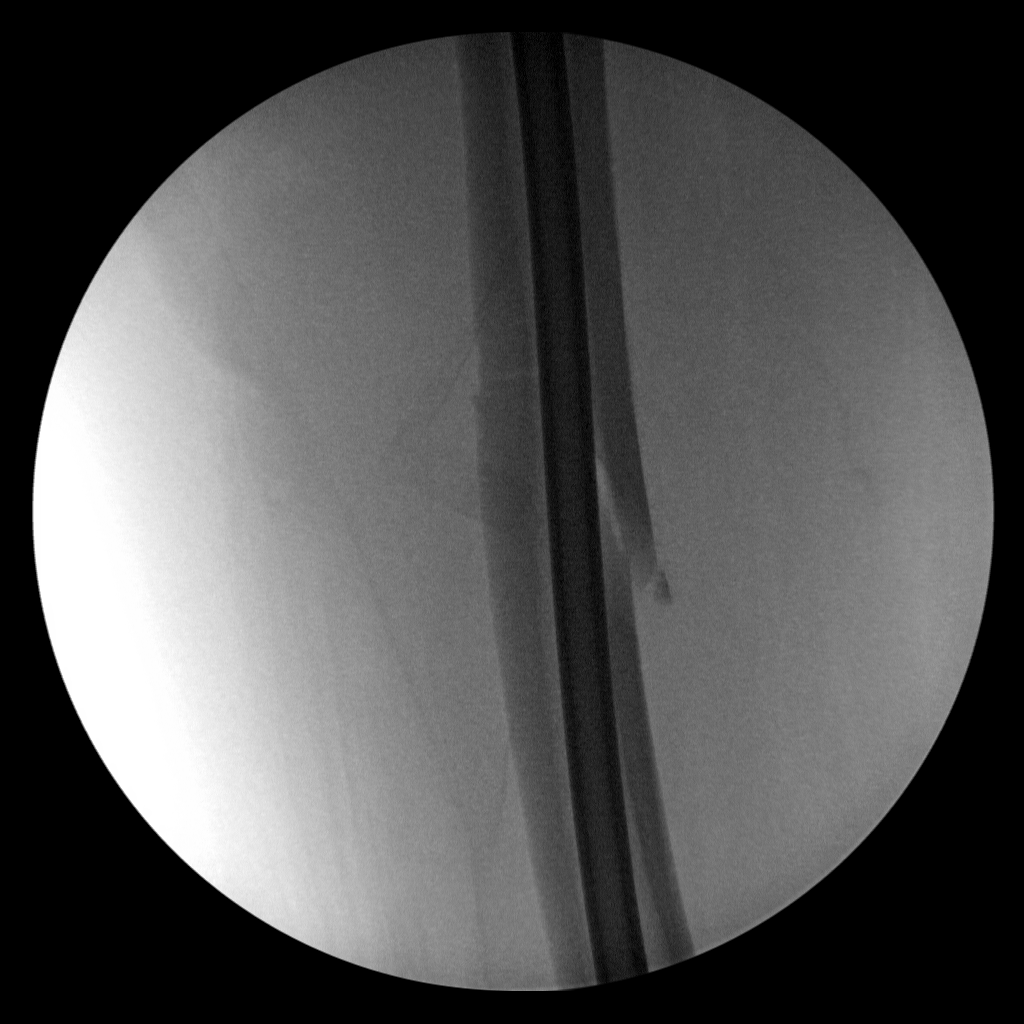
[im 3/5]
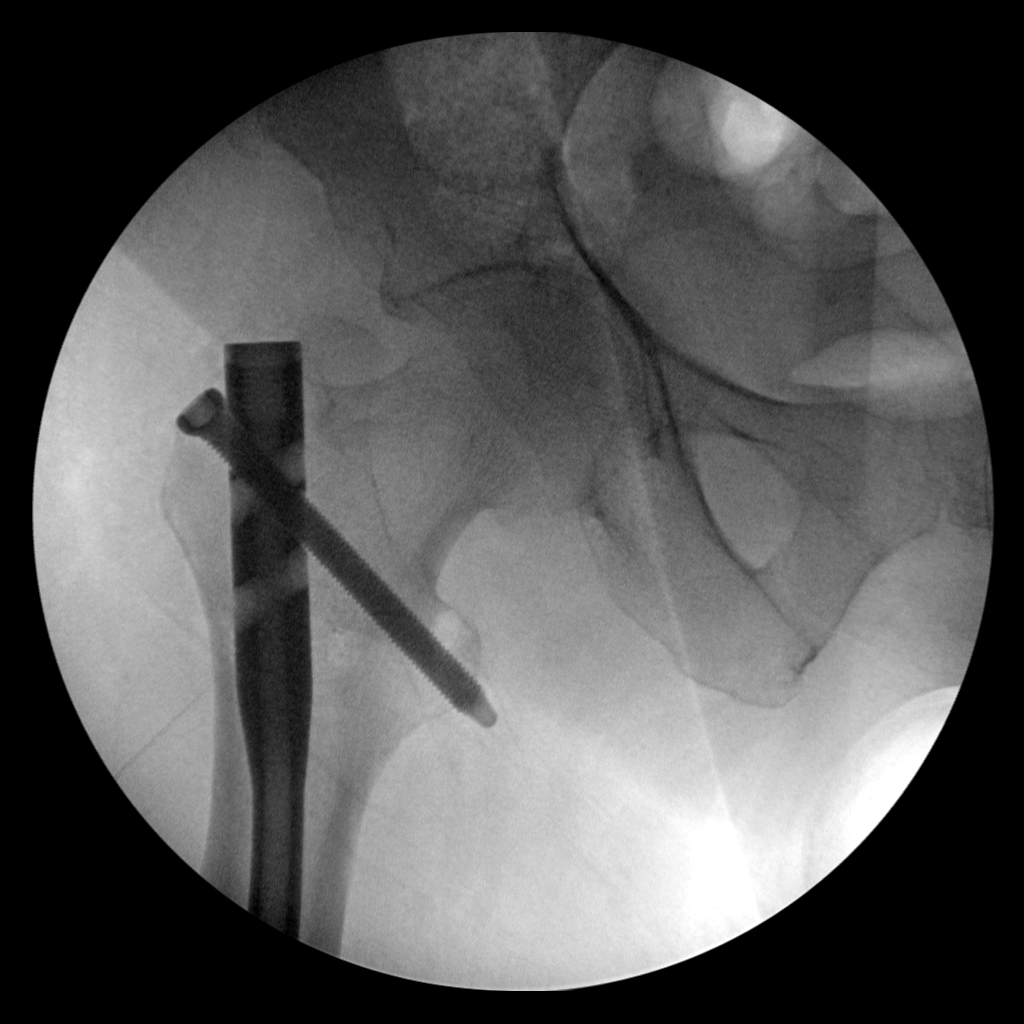
[im 4/5]
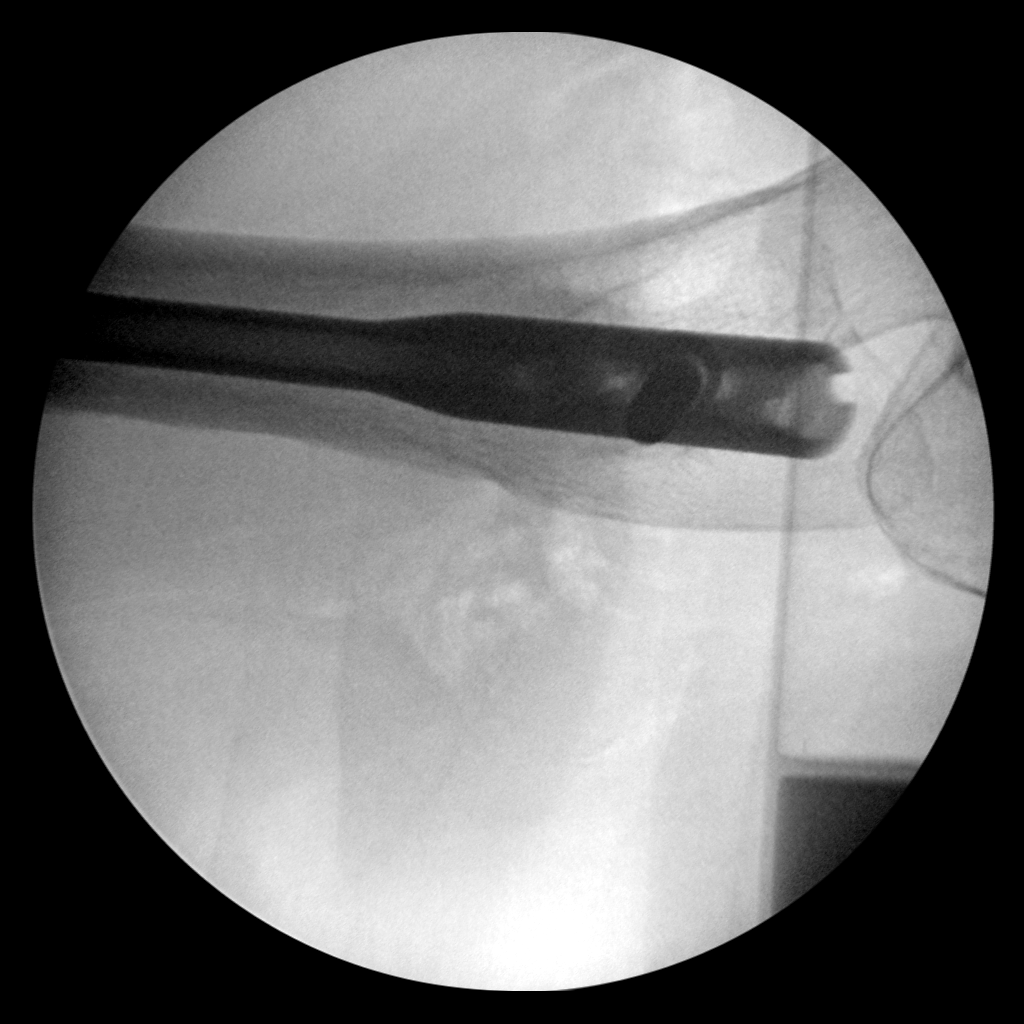
[im 5/5]
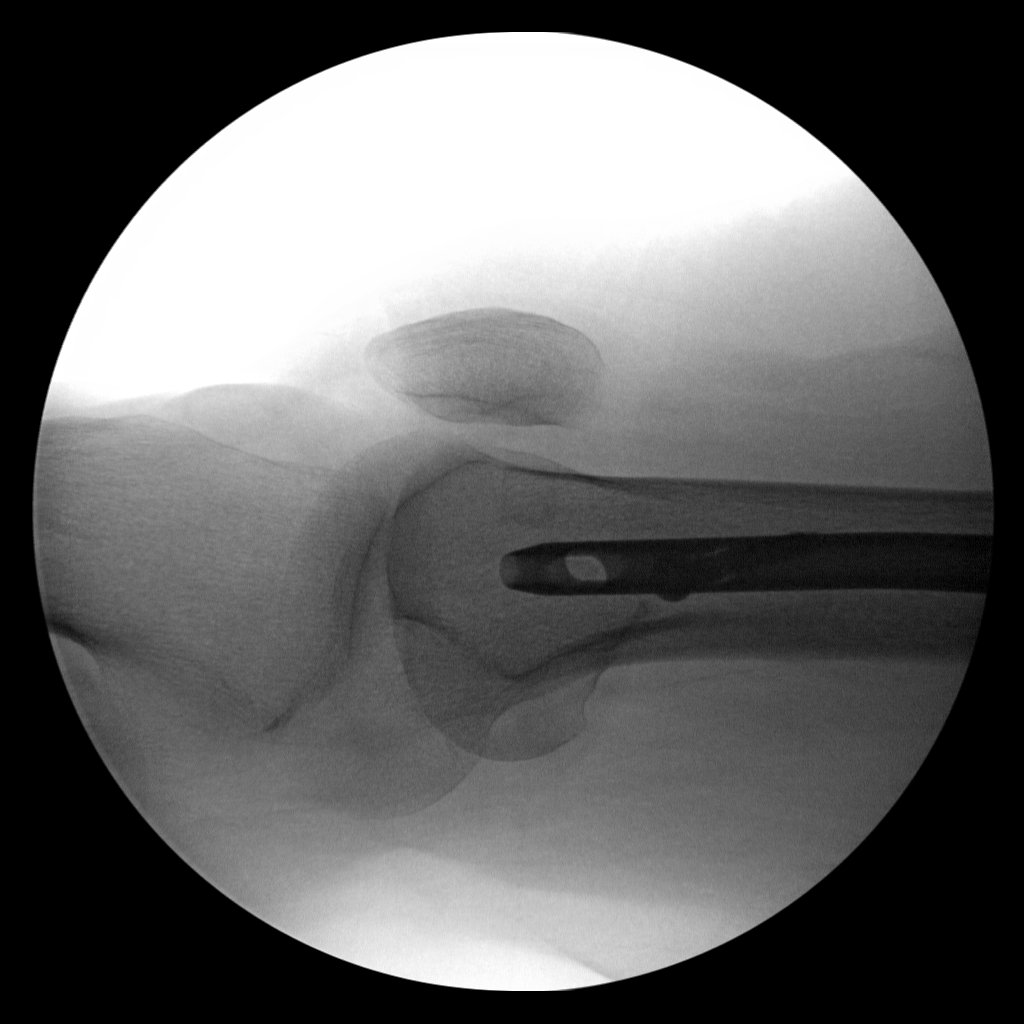

[5 of 5 positions shown; findings below may reference images not displayed]

FINDINGS: Five intraoperative spot image demonstrate intramedullary
nail across the midshaft right femoral fracture with near anatomic
alignment.  Single proximal intertrochanteric screw and single
distal screw present.
IMPRESSION: Internal fixation.  Near anatomic alignment.

## 2012-05-15 ENCOUNTER — Ambulatory Visit
Admission: RE | Admit: 2012-05-15 | Discharge: 2012-05-15 | Disposition: A | Discharge status: Home / Self Care | Payer: Medicare Other | Source: Ambulatory Visit | Attending: Radiation Oncology | Admitting: Radiation Oncology

## 2012-05-15 ENCOUNTER — Telehealth: Payer: Self-pay | Admitting: *Deleted

## 2012-05-15 ENCOUNTER — Encounter: Payer: Self-pay | Admitting: Radiation Oncology

## 2012-05-15 VITALS — BP 177/72 | HR 65 | Temp 98.5°F | Resp 20 | Wt 155.0 lb

## 2012-05-15 DIAGNOSIS — C50919 Malignant neoplasm of unspecified site of unspecified female breast: Secondary | ICD-10-CM

## 2012-05-15 NOTE — Telephone Encounter (Signed)
Mailed out calendar to inform the patient of the new date and time md will be on vac

## 2012-05-15 NOTE — Progress Notes (Signed)
Electron beam simulation note: The patient underwent virtual simulation for her medial left breast boost. She was set up en face with 12 MEV electrons. The depth dose curves was reviewed. Due to the volume of the boost I prescribing 1000 cGy in 5 sessions, prescribing the dose to the 90% isodose curve. One custom block is constructed to conform the field with a 1.5 cm margin around the tumor bed. A special port plan is requested. She will then undergo a reduced field without a margin for further 600 cGy 3 sessions.

## 2012-05-15 NOTE — Progress Notes (Signed)
Patient here for weekly rad txs,24/30 completed left breast;,  left axilla and breast erythema, skin intact,thinning under arm and under inframmary fold, left nipple still inverted C/o itching some, under arm, stated pain over the weekend near nipple area had to take a pain pill to make the sharp pains stop, eating well2:48 PM

## 2012-05-15 NOTE — Progress Notes (Signed)
Weekly Management Note:  Site: Left breast Current Dose:  4800  cGy Projected Dose: 6600  CGy including reduced field electron beam boosts  Narrative: The patient is seen today for routine under treatment assessment. CBCT/MVCT images/port films were reviewed. The chart was reviewed.   She an episode of left breast discomfort last Wednesday evening, but this has not returned. This was more localized to the nipple areolar region. She uses Radioplex gel.  Physical Examination:  Filed Vitals:   05/15/12 1444  BP: 177/72  Pulse: 65  Temp: 98.5 F (36.9 C)  Resp: 20  .  Weight: 155 lb (70.308 kg). There is erythema the skin along left breast with no areas of desquamation.  Impression: Tolerating radiation therapy well.  Plan: Continue radiation therapy as planned.

## 2012-05-16 ENCOUNTER — Ambulatory Visit
Admission: RE | Admit: 2012-05-16 | Discharge: 2012-05-16 | Disposition: A | Discharge status: Home / Self Care | Payer: Medicare Other | Source: Ambulatory Visit | Attending: Radiation Oncology | Admitting: Radiation Oncology

## 2012-05-17 ENCOUNTER — Ambulatory Visit: Payer: Medicare Other

## 2012-05-17 ENCOUNTER — Ambulatory Visit
Admission: RE | Admit: 2012-05-17 | Discharge: 2012-05-17 | Disposition: A | Discharge status: Home / Self Care | Payer: Medicare Other | Source: Ambulatory Visit | Attending: Radiation Oncology | Admitting: Radiation Oncology

## 2012-05-18 ENCOUNTER — Ambulatory Visit: Payer: Medicare Other

## 2012-05-18 ENCOUNTER — Ambulatory Visit
Admission: RE | Admit: 2012-05-18 | Discharge: 2012-05-18 | Disposition: A | Discharge status: Home / Self Care | Payer: Medicare Other | Source: Ambulatory Visit | Attending: Radiation Oncology | Admitting: Radiation Oncology

## 2012-05-19 ENCOUNTER — Ambulatory Visit: Payer: Medicare Other

## 2012-05-19 ENCOUNTER — Ambulatory Visit
Admission: RE | Admit: 2012-05-19 | Discharge: 2012-05-19 | Disposition: A | Discharge status: Home / Self Care | Payer: Medicare Other | Source: Ambulatory Visit | Attending: Radiation Oncology | Admitting: Radiation Oncology

## 2012-05-22 ENCOUNTER — Ambulatory Visit: Payer: Medicare Other

## 2012-05-22 ENCOUNTER — Ambulatory Visit
Admission: RE | Admit: 2012-05-22 | Discharge: 2012-05-22 | Disposition: A | Discharge status: Home / Self Care | Payer: Medicare Other | Source: Ambulatory Visit | Attending: Radiation Oncology | Admitting: Radiation Oncology

## 2012-05-22 ENCOUNTER — Encounter: Payer: Self-pay | Admitting: Radiation Oncology

## 2012-05-22 VITALS — BP 133/72 | HR 72 | Temp 97.4°F | Resp 20 | Wt 153.3 lb

## 2012-05-22 DIAGNOSIS — C50919 Malignant neoplasm of unspecified site of unspecified female breast: Secondary | ICD-10-CM

## 2012-05-22 MED ORDER — BIAFINE EX EMUL
Freq: Two times a day (BID) | CUTANEOUS | Status: DC
  Administered 2012-05-22: 11:00:00 via TOPICAL

## 2012-05-22 NOTE — Addendum Note (Signed)
Encounter addended by: Glennie Hawk, RN on: 05/22/2012  1:01 PM<BR>     Documentation filed: Inpatient MAR, Orders

## 2012-05-22 NOTE — Progress Notes (Signed)
Weekly Management Note:  Site: Left breast boost Current Dose:  5800  cGy Projected Dose: 6600  cGy  Narrative: The patient is seen today for routine under treatment assessment. CBCT/MVCT images/port films were reviewed. The chart was reviewed.   No new complaints today. She change from Radioplex gel to Biafine cream.  Physical Examination:  Filed Vitals:   05/22/12 1052  BP: 133/72  Pulse: 72  Temp: 97.4 F (36.3 C)  Resp: 20  .  Weight: 153 lb 4.8 oz (69.536 kg). There is erythema the skin along the left breast along with patchy dry desquamation but no areas of moist desquamation.  Impression: Tolerating radiation therapy well.  Plan: Continue radiation therapy as planned.

## 2012-05-22 NOTE — Progress Notes (Signed)
Pt c/o itchiness in tx area right breast, gave her Biafine cream, bright hyperpigmentation. She states occasional pains in her right breast that go away quickly. She denies loss of appetite, is fatigued.

## 2012-05-22 NOTE — Progress Notes (Signed)
Electron beam simulation note: The patient underwent virtual simulation for her reduced electron beam field. She was set up without a block margin. One custom block was constructed to shield the normal shredding structures. I'm prescribing a further 600 cGy in 3 sessions, again utilizing 12 MEV electrons. Special port plan reviewed.

## 2012-05-23 ENCOUNTER — Ambulatory Visit: Payer: Medicare Other

## 2012-05-23 ENCOUNTER — Ambulatory Visit
Admission: RE | Admit: 2012-05-23 | Discharge: 2012-05-23 | Disposition: A | Discharge status: Home / Self Care | Payer: Medicare Other | Source: Ambulatory Visit | Attending: Radiation Oncology | Admitting: Radiation Oncology

## 2012-05-24 ENCOUNTER — Telehealth: Payer: Self-pay | Admitting: Family Medicine

## 2012-05-24 ENCOUNTER — Ambulatory Visit: Payer: Medicare Other

## 2012-05-24 ENCOUNTER — Ambulatory Visit (INDEPENDENT_AMBULATORY_CARE_PROVIDER_SITE_OTHER): Payer: Medicare Other | Admitting: *Deleted

## 2012-05-24 ENCOUNTER — Ambulatory Visit
Admission: RE | Admit: 2012-05-24 | Discharge: 2012-05-24 | Disposition: A | Discharge status: Home / Self Care | Payer: Medicare Other | Source: Ambulatory Visit | Attending: Radiation Oncology | Admitting: Radiation Oncology

## 2012-05-24 DIAGNOSIS — Z23 Encounter for immunization: Secondary | ICD-10-CM

## 2012-05-24 MED ORDER — OXYCODONE HCL 5 MG PO TABS: 2.5000 mg | ORAL_TABLET | Freq: Three times a day (TID) | ORAL | Status: DC | PRN

## 2012-05-24 NOTE — Telephone Encounter (Signed)
Pt was here today with her husband for his visit.  She is finishing her radiation treatments soon.  She has had some occ L sided chest pain and axillary pain, on the side with the radiation.  This has happened intermittently since the surgery.  Overall it is improved but she still has occ episodes of pain.  She has some mild and expected erythema from the radiation but no masses or skin breakdown.  She has some left over oxycodone that she had used episodically.  I gave her a refill for 20 pills today and she can use prn with sedation caution.  If not improved, she'll let me know.

## 2012-05-25 ENCOUNTER — Ambulatory Visit: Payer: Medicare Other

## 2012-05-25 ENCOUNTER — Encounter: Payer: Self-pay | Admitting: Radiation Oncology

## 2012-05-25 ENCOUNTER — Ambulatory Visit
Admission: RE | Admit: 2012-05-25 | Discharge: 2012-05-25 | Disposition: A | Discharge status: Home / Self Care | Payer: Medicare Other | Source: Ambulatory Visit | Attending: Radiation Oncology | Admitting: Radiation Oncology

## 2012-05-25 ENCOUNTER — Telehealth: Payer: Self-pay

## 2012-05-25 NOTE — Progress Notes (Signed)
Weekly Management Note:  Site: Left breast boost Current Dose:  6400  cGy Projected Dose: 6400  cGy  Narrative: The patient is seen today for routine under treatment assessment. CBCT/MVCT images/port films were reviewed. The chart was reviewed.   She is without new complaints today. She uses Biafine cream.  Physical Examination: There were no vitals filed for this visit..  Weight:  . There is erythema of the skin along the left breast, particularly along the medial left breast tumor bed. There is dry desquamation but no moist desquamation  Impression: Tolerating radiation therapy well.  Plan: Continue radiation therapy as planned. She finishes her radiation therapy tomorrow.

## 2012-05-25 NOTE — Telephone Encounter (Signed)
[name] with [facility] left v/m requesting diagnosis code to go with oxycodone rx. Pt waiting.

## 2012-05-25 NOTE — Telephone Encounter (Signed)
Dx:  [phone] Sue Lush at [facility] advised.

## 2012-05-25 NOTE — Telephone Encounter (Signed)
Chest wall pain.  Thanks.

## 2012-05-26 ENCOUNTER — Ambulatory Visit: Payer: Medicare Other

## 2012-05-26 ENCOUNTER — Ambulatory Visit
Admission: RE | Admit: 2012-05-26 | Discharge: 2012-05-26 | Disposition: A | Discharge status: Home / Self Care | Payer: Medicare Other | Source: Ambulatory Visit | Attending: Radiation Oncology | Admitting: Radiation Oncology

## 2012-05-27 ENCOUNTER — Encounter: Payer: Self-pay | Admitting: Radiation Oncology

## 2012-05-27 NOTE — Progress Notes (Signed)
[facility] Radiation Oncology End of Treatment Note  Name:[name]  Date: 05/27/2012 JWJ:[id number] DOB:08-04-1940   Status:outpatient    CC: Crawford Givens, MD  , Dr. Emelia Loron, Dr. Pierce Crane  REFERRING PHYSICIAN:   Dr. Emelia Loron   DIAGNOSIS: Stage I (T1, N0, M0) invasive ductal carcinoma of the left breast with tumor focally present at the posterior resection margin   INDICATION FOR TREATMENT: Curative   TREATMENT DATES: 04/10/2012 through 05/26/2012                          SITE/DOSE:   Left breast 5000 cGy 25 sessions left breast boost 1600 cGy 8 sessions                         BEAMS/ENERGY: 6 MV photons helical IMRT Tomotherapy for the first 5000 cGy, then 12 MEV electrons for her left breast tumor bed boost                  NARRATIVE: The patient tolerated treatment well although she had marked erythema and dry desquamation the skin along the medial breast/tumor bed by completion of therapy. 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                           PLAN: Routine followup in one month. Patient instructed to call if questions or worsening complaints in interim.

## 2012-05-29 ENCOUNTER — Ambulatory Visit: Payer: Medicare Other

## 2012-05-30 ENCOUNTER — Ambulatory Visit: Payer: Medicare Other

## 2012-05-31 ENCOUNTER — Ambulatory Visit: Payer: Medicare Other

## 2012-06-01 ENCOUNTER — Ambulatory Visit: Payer: Medicare Other

## 2012-06-02 ENCOUNTER — Ambulatory Visit: Payer: Medicare Other

## 2012-06-05 ENCOUNTER — Ambulatory Visit: Payer: Medicare Other

## 2012-06-06 ENCOUNTER — Ambulatory Visit: Payer: Medicare Other

## 2012-06-08 ENCOUNTER — Ambulatory Visit: Payer: Medicare Other

## 2012-06-11 NOTE — Progress Notes (Signed)
Hematology and Oncology Follow Up Visit  [name] [id number] Oct 10, 1940 71 y.o. 06/11/2012 4:56 PM PCP Dr Dwain Sarna Principle Diagnosis: locally advanced breast cancer, on neoadjuvant femara x 4 Months, s/p lumpectomy 03/01/12 for [id number] er+ pr- breast cancer, s/p xrt completed 05/26/2012.   Interim History:  There have been no intercurrent illness, hospitalizations or medication changes. Tolerating femara, she is having some hot flashes. She is not having other complaints. She did well with radiation with minimal s/e or skin reactions. She followup with her surgeon in August.  Medications: I have reviewed the patient's current medications.  Allergies:  Allergies  Allergen Reactions  . Bisphosphonates     Unclear relation to meds, but pt had long bone fracture after treatment with fosamax  . Diclofenac Potassium   . Ezetimibe-Simvastatin   . Naproxen Sodium   . Nsaids   . Simvastatin   . Sulfonamide Derivatives   . Vicodin (Hydrocodone-Acetaminophen) Nausea Only    Past Medical History, Surgical history, Social history, and Family History were reviewed and updated.  Review of Systems: Constitutional:  Negative for fever, chills, night sweats, anorexia, weight loss, pain. Cardiovascular: negative Respiratory: negative Neurological: negative Dermatological: negative ENT: negative Skin Gastrointestinal: no abdominal pain, change in bowel habits, or black or bloody stools Genito-Urinary: negative Hematological and Lymphatic: negative Breast: negative Musculoskeletal: negative Remaining ROS negative.  Physical Exam: Blood pressure 169/77, pulse 55, temperature 98.5 F (36.9 C), resp. rate 20, height 5\' 1"  (1.549 m), weight 154 lb (69.854 kg). ECOG: 0 General appearance: alert, cooperative and appears stated age Head: Normocephalic, without obvious abnormality, atraumatic Neck: no adenopathy, no carotid bruit, no JVD, supple, symmetrical, trachea midline and thyroid  not enlarged, symmetric, no tenderness/mass/nodules Lymph nodes: Cervical, supraclavicular, and axillary nodes normal. Cardiac : regular rate and rhythm, no murmurs or gallops Pulmonary:clear to auscultation bilaterally and normal percussion bilaterally Breasts: inspection negative, no nipple discharge or bleeding, no masses or nodularity palpable, lt breast s/p lumpectomy and xrt , resolving skin erythema.  Abdomen:normal findings: bowel sounds normal Extremities negative Neuro: alert, oriented, normal speech, no focal findings or movement disorder noted  Lab Results: Lab Results  Component Value Date   WBC 8.3 03/22/2012   HGB 13.5 03/22/2012   HCT 39.3 03/22/2012   MCV 88.7 03/22/2012   PLT 250 03/22/2012     Chemistry      Component Value Date/Time   NA 135* 03/22/2012 1200   NA 134* 02/25/2012 1400   K 4.5 03/22/2012 1200   K 4.3 02/25/2012 1400   CL 100 03/22/2012 1200   CL 98 02/25/2012 1400   CO2 25 03/22/2012 1200   CO2 26 02/25/2012 1400   BUN 14.0 03/22/2012 1200   BUN 14 02/25/2012 1400   CREATININE 0.8 03/22/2012 1200   CREATININE 0.63 02/25/2012 1400      Component Value Date/Time   CALCIUM 10.4 03/22/2012 1200   CALCIUM 10.4 02/25/2012 1400   ALKPHOS 87 03/22/2012 1200   ALKPHOS 59 12/31/2011 1340   AST 36* 03/22/2012 1200   AST 41* 12/31/2011 1340   ALT 45 03/22/2012 1200   ALT 50* 12/31/2011 1340   BILITOT 0.50 03/22/2012 1200   BILITOT 0.4 12/31/2011 1340      .pathology. Radiological Studies: chest X-ray n/a Mammogram n/a Bone density n/a  Impression and Plan:  Locally advanced ER/PR positive breast cancer on Femara for 4 month, followed by lumpectomy and radiation. She continues on femara. And will be seen in 6 months with imaging  studies.   More than 50% of the visit was spent in patient-related counselling   Pierce Crane, MD 12/29/20134:56 PM

## 2012-06-15 ENCOUNTER — Encounter: Payer: Self-pay | Admitting: Family Medicine

## 2012-06-21 ENCOUNTER — Telehealth: Payer: Self-pay

## 2012-06-21 MED ORDER — BUSPIRONE HCL 5 MG PO TABS: 5.0000 mg | ORAL_TABLET | Freq: Two times a day (BID) | ORAL | Status: DC

## 2012-06-21 NOTE — Telephone Encounter (Signed)
[name] pts daughter called concerned for her mother after pts husband died on 06-28-2012. Camelia Eng is aware pt spoke with Dr Para March since the death of her husband but pt is not sleeping and is anxious. Pt is taking Ativan 1 mg taking 2 tabs every 3 hours and that is not helping pt now. Camelia Eng does not want to schedule appt to come in but request call back. [facility].Please advise.

## 2012-06-21 NOTE — Telephone Encounter (Signed)
I called pt and daughter. Pt was amenable to trying buspar.  I would not stop ativan cold [place].  D/w pt in detail.  She agrees.  She has family support.  They'll call back if not improving.

## 2012-06-22 ENCOUNTER — Telehealth: Payer: Self-pay | Admitting: Family Medicine

## 2012-06-22 MED ORDER — ONDANSETRON HCL 4 MG PO TABS: 4.0000 mg | ORAL_TABLET | Freq: Three times a day (TID) | ORAL | Status: DC | PRN

## 2012-06-22 NOTE — Telephone Encounter (Signed)
D/w pt today.  Nausea likely related to stress of husband's death.  D/w pt about options.  zofran would be reasonable.  rx sent.

## 2012-06-26 ENCOUNTER — Encounter: Payer: Self-pay | Admitting: Radiation Oncology

## 2012-06-27 ENCOUNTER — Ambulatory Visit
Admission: RE | Admit: 2012-06-27 | Discharge: 2012-06-27 | Disposition: A | Discharge status: Home / Self Care | Payer: Medicare Other | Source: Ambulatory Visit | Attending: Radiation Oncology | Admitting: Radiation Oncology

## 2012-06-27 ENCOUNTER — Encounter: Payer: Self-pay | Admitting: Radiation Oncology

## 2012-06-27 VITALS — BP 172/68 | HR 58 | Temp 97.8°F | Resp 20

## 2012-06-27 DIAGNOSIS — C50219 Malignant neoplasm of upper-inner quadrant of unspecified female breast: Secondary | ICD-10-CM

## 2012-06-27 NOTE — Progress Notes (Signed)
Patient here for follow up s/p rad tx left breast 04/10/12-07/28/11 Alert,oriented x3, still hurts at incision on left breast,knot there, well healed, patient husband passed away suddenly New Years Eve 2013, increased anxiety, Buspar added by Dr. Para March 06/21/12,patient takes ativan mostly at night 11:17 AM

## 2012-06-27 NOTE — Progress Notes (Signed)
Followup note:  [name] returns today approximately 1 month following completion of radiation therapy following conservative surgery in the management of her T1 N0 invasive ductal carcinoma with a focally positive deep resection margin. She still having some discomfort along the left parasternal region. She continues with her Femara. She has not yet heard back from medical oncology regarding the transfer of her medical oncology care from Dr. Donnie Coffin to another medical oncologist. She tells me that she sees Dr. Dwain Sarna in May. She has not yet have an appointment for followup mammography. Unfortunately, she lost her husband on New Year's Eve, presumably from a heart attack after dental extractions.  Physical examination: Alert and oriented. Wt Readings from Last 3 Encounters:  05/22/12 153 lb 4.8 oz (69.536 kg)  05/15/12 155 lb (70.308 kg)  05/08/12 156 lb 11.2 oz (71.079 kg)   Temp Readings from Last 3 Encounters:  06/27/12 97.8 F (36.6 C) Oral  05/22/12 97.4 F (36.3 C) Oral  05/15/12 98.5 F (36.9 C) Oral   BP Readings from Last 3 Encounters:  06/27/12 172/68  05/22/12 133/72  05/15/12 177/72   Pulse Readings from Last 3 Encounters:  06/27/12 58  05/22/12 72  05/15/12 65   Head and neck examination: Grossly unremarkable. Nodes: Without palpable cervical, supraclavicular, or axillary lymphadenopathy. Chest: Lungs clear. Breasts: There is residual induration along her medial partial mastectomy scar but no discrete mass. There is residual hyperpigmentation the skin care Both nipples are inverted.  Impression: Satisfactory progress. She will await the transfer of her medical oncology care to one of the other medical oncologists. She was to see Dr. Donnie Coffin back in April. She tells that she will see Dr. Dwain Sarna in May. I assume that Dr. Dwain Sarna will get her scheduled for followup mammography. She'll continue with her Femara.  Plan: As discussed above.

## 2012-07-17 ENCOUNTER — Other Ambulatory Visit: Payer: Self-pay | Admitting: *Deleted

## 2012-07-17 NOTE — Telephone Encounter (Signed)
Faxed refill request. Please advise.  

## 2012-07-18 ENCOUNTER — Other Ambulatory Visit: Payer: Self-pay | Admitting: *Deleted

## 2012-07-18 MED ORDER — LORAZEPAM 1 MG PO TABS: ORAL_TABLET | ORAL | Status: DC

## 2012-07-18 NOTE — Telephone Encounter (Signed)
Please call in

## 2012-07-18 NOTE — Telephone Encounter (Signed)
Rx phoned to pharmacy.  

## 2012-08-07 ENCOUNTER — Encounter: Payer: Self-pay | Admitting: Radiation Oncology

## 2012-08-07 ENCOUNTER — Telehealth: Payer: Self-pay | Admitting: *Deleted

## 2012-08-07 NOTE — Progress Notes (Signed)
Patient came in. Her discount has expired. I gave her a new one to send back to me. Her hubby is deceased, so it is just her and social security 751.00 per month. She will get her bank statement off line.

## 2012-08-07 NOTE — Telephone Encounter (Signed)
Left message for a return phone call to reschedule her appt.  

## 2012-08-08 ENCOUNTER — Telehealth: Payer: Self-pay | Admitting: *Deleted

## 2012-08-08 NOTE — Telephone Encounter (Signed)
Received call back from patient to reschedule her appt. Confirmed appt. For 09/21/12 at 11am with Bernell List. Then will become Dr. Darnelle Catalan.

## 2012-08-16 ENCOUNTER — Encounter: Payer: Self-pay | Admitting: Radiation Oncology

## 2012-08-16 NOTE — Progress Notes (Signed)
Patient sent back her EPP. I will scan and email Delice Bison the application. She needs to get all of the patient's bank statement. She only sent in 1 page (should be 4).

## 2012-08-21 ENCOUNTER — Other Ambulatory Visit: Payer: Self-pay | Admitting: Family Medicine

## 2012-08-21 ENCOUNTER — Encounter: Payer: Self-pay | Admitting: Oncology

## 2012-08-21 ENCOUNTER — Encounter: Payer: Self-pay | Admitting: *Deleted

## 2012-08-21 NOTE — Progress Notes (Signed)
Mailed letter & calendar to pt. 

## 2012-08-21 NOTE — Telephone Encounter (Signed)
Electronic refill request.  Patient has not been seen in the office in quite some time but apparently is seeing Oncology.  Please advise.

## 2012-08-22 NOTE — Telephone Encounter (Signed)
I have talked with her mult times in the interval. Okay to continue both.  Sent.

## 2012-08-28 ENCOUNTER — Encounter: Payer: Self-pay | Admitting: Radiation Oncology

## 2012-08-28 NOTE — Progress Notes (Signed)
Called and left message for patient to call me back. I need all of her bank statement. I only have page 1 of 4.

## 2012-09-06 ENCOUNTER — Telehealth: Payer: Self-pay

## 2012-09-06 NOTE — Telephone Encounter (Signed)
Danielle, Counselling psychologist at [facility] said pt is there for teeth cleaning;BP 172/82, 178/84 and 159/79. Danielle wanted to ck with Dr Para March to verify it is OK to clean pts' teeth today with reported BP.  Dental appts make pt very anxious; pt has no compliants;no h/a dizziness,CP or SOB. At 02/22/12 visit BP 124/84. Dr Para March said OK to do just teeth cleaning today. Danielle voiced understanding.

## 2012-09-12 ENCOUNTER — Encounter: Payer: Self-pay | Admitting: Radiation Oncology

## 2012-09-12 NOTE — Progress Notes (Signed)
Called and left message for the patient. I need to know about the deposits on bank statement.

## 2012-09-13 ENCOUNTER — Encounter: Payer: Self-pay | Admitting: Family Medicine

## 2012-09-13 ENCOUNTER — Ambulatory Visit (INDEPENDENT_AMBULATORY_CARE_PROVIDER_SITE_OTHER): Payer: Medicare Other | Admitting: Family Medicine

## 2012-09-13 VITALS — BP 150/78 | HR 60 | Temp 98.1°F | Wt 145.5 lb

## 2012-09-13 DIAGNOSIS — F411 Generalized anxiety disorder: Secondary | ICD-10-CM

## 2012-09-13 DIAGNOSIS — I10 Essential (primary) hypertension: Secondary | ICD-10-CM

## 2012-09-13 NOTE — Patient Instructions (Signed)
Try taking the metoprolol 50mg  twice a day.   Goal BP is <140/<90.  If your pulse is lower than 60 consistently or if you are getting lightheaded on standing, then cut back to the 25mg  dose (twice a day). Take care. Glad to see you.

## 2012-09-13 NOTE — Progress Notes (Signed)
Hypertension:  BP was elevated on mult checks recently She was widowed at the end of 2013 and this may play a role in her BP elevation.  She's been taking 25mg  of metoprolol twice a day, instead of 50mg  BID.   Using medication without problems or lightheadedness: no Chest pain with exertion:no Edema:no Short of breath:no Average home BPs: elevated on mult checks.   D/w pt about her anxiety.  She has continued on sertraline and ativan with some relief.  She has the most trouble at night.  She gets lonely at night. "I'm getting over it some but it flares back up.  Seeing him die was the worse part, actually seeing it happen."  She's planning to move to a smaller home near her daughter.    She has f/u about her h/o breast cancer pending.   Meds, vitals, and allergies reviewed.   PMH and SH reviewed  ROS: See HPI.  Otherwise negative.    GEN: nad, alert and oriented HEENT: mucous membranes moist NECK: supple w/o LA CV: rrr. PULM: ctab, no inc wob ABD: soft, +bs EXT: no edema SKIN: no acute rash

## 2012-09-14 ENCOUNTER — Encounter (INDEPENDENT_AMBULATORY_CARE_PROVIDER_SITE_OTHER): Payer: Self-pay | Admitting: General Surgery

## 2012-09-14 ENCOUNTER — Ambulatory Visit (INDEPENDENT_AMBULATORY_CARE_PROVIDER_SITE_OTHER): Payer: Medicare Other | Admitting: General Surgery

## 2012-09-14 VITALS — BP 136/82 | HR 64 | Temp 97.6°F | Resp 14 | Ht 61.0 in | Wt 147.6 lb

## 2012-09-14 DIAGNOSIS — C50919 Malignant neoplasm of unspecified site of unspecified female breast: Secondary | ICD-10-CM

## 2012-09-14 DIAGNOSIS — C50912 Malignant neoplasm of unspecified site of left female breast: Secondary | ICD-10-CM

## 2012-09-14 NOTE — Progress Notes (Signed)
Subjective:     Patient ID: [name], female   DOB: Oct 12, 1940, 72 y.o.   MRN: [id number]  HPI This is a 72 year old female who underwent a primary endocrine therapy for a very medial left breast cancer. She had a good result and then eventually underwent a lumpectomy as well as a sentinel lymph node biopsy. This was followed by radiation therapy which completed in December of 2013 this is a stage I breast cancer. She is now on letrozole. She reports some pain occasionally in her left breast but is otherwise without complaints. She has her 6 month followup at the cancer center coming up soon. She previously was under the care of Dr. Donnie Coffin I'm not sure and she's going to followup with now. She is tolerating her letrozole very well. She should be due for a mammogram this summer after she completed radiation therapy to get her back on schedule. Her husband has also unexpectedly passed away in 2022/07/13 of this year we discussed that today as well.  Review of Systems     Objective:   Physical Exam  Vitals reviewed. Constitutional: She appears well-developed and well-nourished.  Pulmonary/Chest: Right breast exhibits no inverted nipple, no mass, no nipple discharge, no skin change and no tenderness. Left breast exhibits inverted nipple and skin change (c/w xrt). Left breast exhibits no mass, no nipple discharge and no tenderness.    Lymphadenopathy:    She has no cervical adenopathy.    She has no axillary adenopathy.       Right: No supraclavicular adenopathy present.       Left: No supraclavicular adenopathy present.       Assessment:     Stage I breast cancer     Plan:     She has no clinical evidence of recurrence.  She will get mm as scheduled, q 6 month exams, continue letrozole, f/u cancer center as scheduled and cont monthly sbe.  She is slowly getting out more after her husband passed. She has good support system with her daughter and I look forward to seeing her at church.

## 2012-09-14 NOTE — Patient Instructions (Signed)

## 2012-09-14 NOTE — Assessment & Plan Note (Signed)
Inc metoprolol to 50mg  bid as long as she doesn't have bradycardia or lightheadedness.  She agrees.  >25 min spent with face to face with patient, >50% counseling and/or coordinating care

## 2012-09-14 NOTE — Assessment & Plan Note (Signed)
Moving to be near her daughter would likely help.  Continue current meds.  She has had a lot of upheaval but I expect her to slowly make progress.  Call back as needed.

## 2012-09-15 ENCOUNTER — Encounter: Payer: Self-pay | Admitting: Radiation Oncology

## 2012-09-15 NOTE — Progress Notes (Signed)
I had been leaving messages for the patient- we had been playing phone tag. I had questions on her bank statement. It is from feb and her hubby was living then and his income was on the application., I advised her I needed a new statement to process the application. I will ok the date of 08/11/12 on application since could not get her.

## 2012-09-15 NOTE — Progress Notes (Signed)
I called the patient back and she said it was ok to use today's date on her application to avoid her having to fill out all over again. She thanked me for not having to do again.

## 2012-09-21 ENCOUNTER — Encounter: Payer: Self-pay | Admitting: Family

## 2012-09-21 ENCOUNTER — Other Ambulatory Visit: Payer: Self-pay | Admitting: Lab

## 2012-09-21 ENCOUNTER — Ambulatory Visit: Payer: Self-pay | Admitting: Oncology

## 2012-09-21 ENCOUNTER — Ambulatory Visit (HOSPITAL_BASED_OUTPATIENT_CLINIC_OR_DEPARTMENT_OTHER): Payer: Medicare Other | Admitting: Family

## 2012-09-21 ENCOUNTER — Telehealth: Payer: Self-pay | Admitting: Oncology

## 2012-09-21 VITALS — BP 146/76 | HR 56 | Temp 98.1°F | Resp 20 | Ht 61.0 in | Wt 147.2 lb

## 2012-09-21 DIAGNOSIS — Z853 Personal history of malignant neoplasm of breast: Secondary | ICD-10-CM

## 2012-09-21 DIAGNOSIS — C50212 Malignant neoplasm of upper-inner quadrant of left female breast: Secondary | ICD-10-CM

## 2012-09-21 DIAGNOSIS — Z171 Estrogen receptor negative status [ER-]: Secondary | ICD-10-CM

## 2012-09-21 DIAGNOSIS — C50219 Malignant neoplasm of upper-inner quadrant of unspecified female breast: Secondary | ICD-10-CM

## 2012-09-21 NOTE — Telephone Encounter (Signed)
, °

## 2012-09-21 NOTE — Patient Instructions (Addendum)
Please contact us at [phone] if you have any questions or concerns.  Keep taking Vitamin D and Calcium daily,  Exercise daily and eat a balanced diet.  Please let us know if you would like any of the available resources offered to breast cancer survivors :-)

## 2012-09-21 NOTE — Progress Notes (Signed)
[facility]  Telephone:([phone] Fax:([phone]) [phone]  OFFICE PROGRESS NOTE   ID: [name]   DOB: 19-Sep-1940  MR#: [id number]  HYQ#:[id number]   PCP: Crawford Givens, MD SU: Emelia Loron, MD RAD ONC: Chipper Herb, MD   HISTORY OF PRESENT ILLNESS: From Dr. Theron Arista Rubin's new patient evaluation note dated 08/18/2011: "This is a pleasant 72 year old woman , from [place], who is referred for evaluation of a breast mass. This woman and has not had a mammogram since approximately 2009. She noted a mass in the medial portion of the left breast about a year ago so. [name] sought medical attention for this and was referred for a mammogram. Bilateral mammogram left breast ultrasound performed 08/09/2011 showed a firm at 71 cm mass the parasternal left breast 1:00 position with retraction of the overlying skin. No other abnormalities were seen. Ultrasound showed a hypoechoic irregular mass with margins. This area was biopsied on the same day and showed a invasive ductal cancer ER +100% PR +17% or less for the next 20% HER-2 was nonamplified. This is felt to be grade 2. An MRI scan of both breasts performed on 08/13/2011 showed an irregular enhancing mass with spiculated margins in the lower quadrant of the left breast measuring 1.5 x 1.7 x 1.9 cm this was abutting the pectoralis muscle enhancement was seen extending along the pectoralis muscle. "  INTERVAL HISTORY: Dr. Darnelle Catalan  and I saw [name] today for followup of left breast invasive ductal carcinoma.  She was last seen by Dr. Donnie Coffin on 03/22/2012.  Since her last office visit,  she became a widow when her husband suddenly passed away on 2012/07/04 after dental surgery.  The patient states that she may move to [place], [place] to be closer to her daughter and to downsize her home. She is establishing herself with Dr. Darrall Dears service today.  REVIEW OF SYSTEMS: A 10 point review of systems was completed and is negative  except occasional constipation.   PAST MEDICAL HISTORY: Past Medical History  Diagnosis Date  . Insomnia, unspecified   . Hypertension   . Hyperlipidemia   . GERD (gastroesophageal reflux disease)   . Anxiety   . Other abnormal glucose   . Osteoporosis, unspecified     with prev fosamax treatment  . Anemia   . Blood transfusion   . Diabetes mellitus 2011    type II, diet controlled  . Cancer 08/09/11 DX     LEFT  . Breast cancer 03/01/12    left breast lumpectomy=invasive ductal ca,grade I/III,INVILVES SKELATAL MUSCLE   er=+,pr=NEG. HER2 NEG.  . History of radiation therapy     RIGHT JAW 5 TXS FOR ABSESSED TOOTH IN HER 30'S   . Arthritis     osteoporosis  . Blood transfusion without reported diagnosis 1983    prbc  . Depression   . History of radiation therapy 04/10/12-05/26/12    left breast    PAST SURGICAL HISTORY: Past Surgical History  Procedure Laterality Date  . Bilateral oophorectomy  1987  . Tubal ligation  1975  . Orif femur fracture    . Hernia repair  1999    LI[facility]  . Sigmoidoscopy    . Breast lumpectomy  03/01/12    LEFT=INVASIVE DUCTAL CA,gRADE i/iii,INVASION AT PSTERIOR RESECTION MARGIN AND INVLOVES SKE;ATAL MUSCLE,PERINEURAL INVASION IDENTIFIED  . Abdominal hysterectomy  1983    b/ls alpingooph orectomy 4 years later    FAMILY HISTORY Family History  Problem Relation Age of Onset  .  Diabetes Mother   . Alcohol abuse Father   . Diabetes Sister   . Cancer Brother     Bladder cancer  . Diabetes Sister     GYNECOLOGIC HISTORY: Menarche at age 50, Menopause at age  23,  no history of hormone replacement therapy.  SOCIAL HISTORY: The patient is a recent widow and was married to her late husband for 46 years.  That was her second marriage.  Her husband died unexpectedly after dental surgery.  The patient has 4 adult children, 2 girls and 2 boys.  She also has 3 grandsons.  The patient may be moving to [place] today and to live closer to her daughter.  The  patient is a retired Nature conservation officer.  She enjoys doing crafts, sewing, working on the computer, and playing with her Bertram [place] terrier.    ADVANCED DIRECTIVES: Not on file  HEALTH MAINTENANCE: History  Substance Use Topics  . Smoking status: Never Smoker   . Smokeless tobacco: Never Used  . Alcohol Use: No    Colonoscopy: None file PAP: Not on file Bone density: The patient has not had a bone density scan Lipid panel: 06/17/2011  Allergies  Allergen Reactions  . Bisphosphonates     Unclear relation to meds, but pt had long bone fracture after treatment with fosamax  . Diclofenac Potassium   . Ezetimibe-Simvastatin   . Naproxen Sodium   . Nsaids   . Simvastatin   . Sulfonamide Derivatives   . Vicodin (Hydrocodone-Acetaminophen) Nausea Only    Current Outpatient Prescriptions  Medication Sig Dispense Refill  . amLODipine (NORVASC) 5 MG tablet Take 1 tablet (5 mg total) by mouth daily.  90 tablet  3  . CALCIUM CITRATE PO Take 500 mg by mouth once.      . Cholecalciferol ([employer] VITAMIN D3) 1000 UNITS capsule Take 1 capsule (1,000 Units total) by mouth daily.      . cyclobenzaprine (FLEXERIL) 10 MG tablet TAKE 1/2 TO1 TABLETS BY MOUTH 3 TIMES DAILY AS NEEDED MUSCLE SPASMS (SEDATION CAUTION)  30 tablet  1  . ibuprofen (ADVIL,MOTRIN) 100 MG tablet Take 100 mg by mouth every 6 (six) hours as needed.      Marland Kitchen letrozole (FEMARA) 2.5 MG tablet TAKE 1 TABLET BY MOUTH ONCE DAILY  30 tablet  5  . LORazepam (ATIVAN) 1 MG tablet 1 tablet by mouth 2-3 times a day as needed for anxiety and 1-2 tabs at night for sleep  150 tablet  2  . metoprolol (LOPRESSOR) 50 MG tablet TAKE 1 TABLET BY MOUTH TWICE A DAY  180 tablet  1  . Omega-3 Fatty Acids (FISH OIL) 1000 MG CAPS Take 1 capsule by mouth 2 (two) times daily.       Marland Kitchen omeprazole (PRILOSEC) 20 MG capsule TAKE 1 CAPSULE BY MOUTH ONCE A DAY  90 capsule  3  . sertraline (ZOLOFT) 100 MG tablet TAKE 1 TABLET BY MOUTH ONCE A DAY  90 tablet  3    No current facility-administered medications for this visit.    OBJECTIVE: Filed Vitals:   09/21/12 1113  BP: 146/76  Pulse: 56  Temp: 98.1 F (36.7 C)  Resp: 20     Body mass index is 27.83 kg/(m^2).      ECOG FS:  Grade 1 - Symptomatic, but fully ambulatory                  General appearance: Alert, cooperative, well nourished, mild distress and tearful  at times when speaking about her husband's recent passing Head: Normocephalic, without obvious abnormality, atraumatic Eyes: Arcus senilis, PERRLA, EOMI Nose: Nares, septum and mucosa are normal, no drainage or sinus tenderness Neck: No adenopathy, supple, symmetrical, trachea midline, thyroid not enlarged, no tenderness Resp: Clear to auscultation bilaterally Cardio: Regular rate and rhythm, S1, S2 normal, no murmur, click, rub or gallop Breasts: Left breast and axilla area have well-healed surgical scars, left breast is visibly smaller than the right breast, radiation changes noted, thickened areola area, left breast tenderness, firm medial mammary ridge bilaterally, no lymphadenopathy, no nipple inversion, no axilla fullness GI: Soft, distended, non-tender, hypoactive bowel sounds, no organomegaly Skin: Small skin tear/laceration on the left forearm Extremities: Extremities normal, atraumatic, no cyanosis or edema, limited bilateral lower extremity range of motion Lymph nodes: Cervical, supraclavicular, and axillary nodes normal Neurologic: Grossly normal   LAB RESULTS: Lab Results  Component Value Date   WBC 8.3 03/22/2012   NEUTROABS 5.1 03/22/2012   HGB 13.5 03/22/2012   HCT 39.3 03/22/2012   MCV 88.7 03/22/2012   PLT 250 03/22/2012      Chemistry      Component Value Date/Time   NA 135* 03/22/2012 1200   NA 134* 02/25/2012 1400   K 4.5 03/22/2012 1200   K 4.3 02/25/2012 1400   CL 100 03/22/2012 1200   CL 98 02/25/2012 1400   CO2 25 03/22/2012 1200   CO2 26 02/25/2012 1400   BUN 14.0 03/22/2012 1200   BUN 14 02/25/2012  1400   CREATININE 0.8 03/22/2012 1200   CREATININE 0.63 02/25/2012 1400      Component Value Date/Time   CALCIUM 10.4 03/22/2012 1200   CALCIUM 10.4 02/25/2012 1400   ALKPHOS 87 03/22/2012 1200   ALKPHOS 59 12/31/2011 1340   AST 36* 03/22/2012 1200   AST 41* 12/31/2011 1340   ALT 45 03/22/2012 1200   ALT 50* 12/31/2011 1340   BILITOT 0.50 03/22/2012 1200   BILITOT 0.4 12/31/2011 1340      Lab Results  Component Value Date   LABCA2 24 12/31/2011    Urinalysis    Component Value Date/Time   COLORURINE YELLOW 10/17/2010 1330   APPEARANCEUR CLEAR 10/17/2010 1330   LABSPEC 1.005 03/22/2012 1304   LABSPEC 1.013 10/17/2010 1330   PHURINE 6.0 10/17/2010 1330   GLUCOSEU NEGATIVE 10/17/2010 1330   HGBUR TRACE* 10/17/2010 1330   BILIRUBINUR NEGATIVE 10/17/2010 1330   KETONESUR NEGATIVE 10/17/2010 1330   PROTEINUR NEGATIVE 10/17/2010 1330   UROBILINOGEN 0.2 10/17/2010 1330   NITRITE NEGATIVE 10/17/2010 1330   LEUKOCYTESUR NEGATIVE 10/17/2010 1330    STUDIES: No results found.  ASSESSMENT: 72 y.o. [name], [place] woman: 1.  Status post left breast needle core biopsy on 08/09/2011 which showed invasive mammary carcinoma at the 9 o'clock position, ER 100%, PR 17%, Ki-67 20%, HER-2/neu negative by CISH with a ratio of 1.30.  2.  Neoadjuvant Femara started on 08/16/2011.  3.  Status post lateral breast MRI on 08/13/2011 which showed foci of nonspecific enhancement was seen bilaterally. An  irregular, enhancing mass with spiculated margins was seen in the lower inner quadrant of the left breast, posteriorly measuring 1.5 x 1.7 x 1.9 cm. The mass abutted the pectoralis muscle and enhancement was seen extending medially along the pectoralis muscle. Enhancement was also seen extending towards the skin where there was overlying retraction and thickening. A biopsy clip was seen in association with the mass. No other suspicious mass or enhancement was seen  in either breast. No internal mammary or axillary adenopathy  was present.  4.  Status post left breast lumpectomy with sentinel node biopsy on 03/01/2012 for a stage I, ypT1c ypN0, 1.2 cm, invasive ductal carcinoma with perineural invasion, grade 1, ER 99%, PR 0%, Ki-67 20%, HER-2/neu negative by CISH with a ratio of 1.11, with 0/1 positive lymph nodes.  5.  Status post radiation therapy from 04/10/2012 through 05/26/2012.  6.  The patient has been on antiestrogen therapy with Femara since 08/2011.  7.  Grief over the recent loss of her husband.   PLAN: 1.  The patient will continue antiestrogen therapy with Femara 2.5 mg by mouth daily.  The patient states she does not need a refill for Femara at this time.  The patient will be due for a bilateral digital diagnostic mammogram soon.  The patient would like a mammogram scheduled later in the summer due to continuing breast tenderness. We will schedule the mammogram for her.  2.  We will also schedule a bone density examination for the patient.  The patient is also encouraged to keep taking vitamin D 1000 IUs by mouth daily in addition to calcium by mouth daily.  3.  The patient was encouraged to consider counseling to help her with her grief regarding the recent passing of her husband.  We will avail our resources to help her get through this difficult time.  4.  We plan to see the patient again in 6 months at which time we will check a CBC, CMP, LDH, and vitamin D level.  All questions were answered.  The patient was encouraged to contact us in the interim with any problems, questions or concerns.   Larina Bras, NP-C 09/23/2012, 8:05 PM

## 2012-09-22 ENCOUNTER — Other Ambulatory Visit: Payer: Medicare Other | Admitting: Lab

## 2012-09-22 ENCOUNTER — Encounter: Payer: Self-pay | Admitting: Oncology

## 2012-09-22 ENCOUNTER — Ambulatory Visit: Payer: Medicare Other | Admitting: Oncology

## 2012-09-22 NOTE — Progress Notes (Signed)
75%ind 09/22/12-03/24/13. I will send letter and card to the patient. All documents are scanned in.

## 2012-10-09 ENCOUNTER — Encounter: Payer: Self-pay | Admitting: Oncology

## 2012-10-09 NOTE — Progress Notes (Signed)
Patient left message. I called back and she advised of her last discount and some bills from 2013 had gone to collection agency. She has new discount and they would not go back and allow 75%. She said surgery in sept 2013- I told her to make sure the prev discount was taken. She will call back and ask billing.

## 2012-10-12 ENCOUNTER — Other Ambulatory Visit: Payer: Self-pay | Admitting: Oncology

## 2012-10-12 ENCOUNTER — Other Ambulatory Visit: Payer: Self-pay | Admitting: *Deleted

## 2012-10-12 DIAGNOSIS — C50212 Malignant neoplasm of upper-inner quadrant of left female breast: Secondary | ICD-10-CM

## 2012-10-12 DIAGNOSIS — Z853 Personal history of malignant neoplasm of breast: Secondary | ICD-10-CM

## 2012-10-12 NOTE — Telephone Encounter (Signed)
Last filled 09/11/12

## 2012-10-13 MED ORDER — LORAZEPAM 1 MG PO TABS: ORAL_TABLET | ORAL | Status: DC

## 2012-10-13 NOTE — Telephone Encounter (Signed)
Rx phoned to pharmacy.  

## 2012-10-13 NOTE — Telephone Encounter (Signed)
Please call in

## 2012-11-29 ENCOUNTER — Other Ambulatory Visit: Payer: Self-pay | Admitting: Family Medicine

## 2012-11-29 NOTE — Telephone Encounter (Signed)
rx sent to pharmacy by e-script  

## 2012-12-12 ENCOUNTER — Telehealth: Payer: Self-pay

## 2012-12-12 NOTE — Telephone Encounter (Signed)
Will see tomorrow.  If emergent sx, to ER.  Rec: low cards in meantime and plenty of fluids. Thanks.

## 2012-12-12 NOTE — Telephone Encounter (Signed)
Advised pt as instructed; pt voiced understanding.

## 2012-12-12 NOTE — Telephone Encounter (Signed)
Pt said felt dizzy,blurred vision and nauseated on 12/11/12; today no dizziness or nausea just feels thirsty and pts sister cked BS at 2:30 pm and BS was 367. Pt last ate at 9AM this morning had cereal and nectarine. Pt not on med for diabetes. No sweating or confusion. Going to stay at daughters home tonight in [place] and has appt with Dr Para March 12-12-12 at 3:15 pm.advised pt if condition changes or worsens prior to appt she will go to UC>

## 2012-12-13 ENCOUNTER — Ambulatory Visit (INDEPENDENT_AMBULATORY_CARE_PROVIDER_SITE_OTHER): Payer: Medicare Other | Admitting: Family Medicine

## 2012-12-13 ENCOUNTER — Encounter: Payer: Self-pay | Admitting: Radiology

## 2012-12-13 ENCOUNTER — Encounter: Payer: Self-pay | Admitting: Family Medicine

## 2012-12-13 VITALS — BP 146/66 | HR 60 | Temp 98.4°F | Wt 143.5 lb

## 2012-12-13 DIAGNOSIS — E119 Type 2 diabetes mellitus without complications: Secondary | ICD-10-CM

## 2012-12-13 LAB — GLUCOSE, POCT (MANUAL RESULT ENTRY): POC Glucose: 231 mg/dl — AB (ref 70–99)

## 2012-12-13 MED ORDER — LORAZEPAM 1 MG PO TABS: ORAL_TABLET | ORAL | Status: DC

## 2012-12-13 MED ORDER — CYCLOBENZAPRINE HCL 10 MG PO TABS: ORAL_TABLET | ORAL | Status: DC

## 2012-12-13 MED ORDER — METFORMIN HCL 500 MG PO TABS: ORAL_TABLET | ORAL | Status: DC

## 2012-12-13 NOTE — Patient Instructions (Addendum)
Go to the lab on the way out.  We'll contact you with your lab report.  Avoid sweets, drink plenty of water.  Look at http://www.diabetes.org/.  Read up on diabetes type 2 and the diet section.   Start metformin, gradually increase the dose as listed.  We'll call about getting the diabetes class set up.  Call me with an update on Monday.

## 2012-12-13 NOTE — Assessment & Plan Note (Signed)
A1c and cmet pending.  Continue hydration for now. Start up taper of metformin, GI caution.  rx for meter given along with strips and lancets.  Will arrange for DM2 education.  D/w pt about avoiding carbs as much as possible for now, foods to avoid listed for patient.  She agrees.   See notes on labs.  Will set f/u after labs resulted.  Okay for outpatient f/u.  >25 min spent with face to face with patient, >50% counseling and/or coordinating care. She'll update [place] next week.

## 2012-12-13 NOTE — Progress Notes (Signed)
DM2.  Fatigue and inc in thirst.  Noted over the last few months.  Sugar was up yesterday and today as reported.  No meds. She is in the midst of moving and has had sig social upheaval.  She needs refills on ativan, flexeril.    PMH and SH reviewed  ROS: See HPI, otherwise noncontributory.  Meds, vitals, and allergies reviewed.   nad ncat Mmm rrr ctab abd soft Ext well perfused with normal skin turgor.

## 2012-12-14 LAB — COMPREHENSIVE METABOLIC PANEL
ALT: 46 U/L — ABNORMAL HIGH (ref 0–35)
AST: 39 U/L — ABNORMAL HIGH (ref 0–37)
Albumin: 4.1 g/dL (ref 3.5–5.2)
Calcium: 9.7 mg/dL (ref 8.4–10.5)
Chloride: 99 mEq/L (ref 96–112)
Creatinine, Ser: 0.6 mg/dL (ref 0.4–1.2)
Potassium: 4 mEq/L (ref 3.5–5.1)

## 2012-12-14 LAB — HEMOGLOBIN A1C: Hgb A1c MFr Bld: 12.8 % — ABNORMAL HIGH (ref 4.6–6.5)

## 2012-12-17 ENCOUNTER — Other Ambulatory Visit: Payer: Self-pay | Admitting: Family Medicine

## 2012-12-17 DIAGNOSIS — E119 Type 2 diabetes mellitus without complications: Secondary | ICD-10-CM

## 2012-12-20 ENCOUNTER — Encounter: Payer: Self-pay | Admitting: Family Medicine

## 2012-12-22 ENCOUNTER — Ambulatory Visit
Admission: RE | Admit: 2012-12-22 | Discharge: 2012-12-22 | Disposition: A | Discharge status: Home / Self Care | Payer: Medicare Other | Source: Ambulatory Visit | Attending: Family | Admitting: Family

## 2012-12-22 ENCOUNTER — Telehealth: Payer: Self-pay | Admitting: Family

## 2012-12-22 DIAGNOSIS — Z853 Personal history of malignant neoplasm of breast: Secondary | ICD-10-CM

## 2012-12-22 DIAGNOSIS — C50212 Malignant neoplasm of upper-inner quadrant of left female breast: Secondary | ICD-10-CM

## 2012-12-22 NOTE — Telephone Encounter (Signed)
Left message that mammogram results did not show any malignancy and had benign findings.  Asked the patient to call if she has any questions.

## 2013-01-17 ENCOUNTER — Other Ambulatory Visit: Payer: Self-pay

## 2013-01-18 ENCOUNTER — Other Ambulatory Visit: Payer: Self-pay | Admitting: *Deleted

## 2013-01-18 MED ORDER — METOPROLOL TARTRATE 50 MG PO TABS: ORAL_TABLET | ORAL | Status: DC

## 2013-01-29 ENCOUNTER — Other Ambulatory Visit: Payer: Self-pay | Admitting: Family Medicine

## 2013-01-29 MED ORDER — GLUCOSE BLOOD VI STRP: ORAL_STRIP | Status: DC

## 2013-02-08 ENCOUNTER — Telehealth: Payer: Self-pay | Admitting: *Deleted

## 2013-02-08 NOTE — Telephone Encounter (Signed)
This RN spoke with pt per her concern regarding noted pain in left arm " from elbow down, like when I reach up to pull a light chain ".  Pt states discomfort is in the " crease " of the left arm, with minimial swelling noted.  This RN discussed possibility of lymphedema occuring and need for referral to lymphedema clinic, pt would prefer to be seen by a provider first " so I know that is what it is ".  This RN scheduled pt for next available with JH/NP on day MD is in the office.

## 2013-02-13 ENCOUNTER — Encounter: Payer: Self-pay | Admitting: Family

## 2013-02-13 ENCOUNTER — Ambulatory Visit (HOSPITAL_BASED_OUTPATIENT_CLINIC_OR_DEPARTMENT_OTHER): Payer: Medicare Other | Admitting: Lab

## 2013-02-13 ENCOUNTER — Ambulatory Visit (HOSPITAL_BASED_OUTPATIENT_CLINIC_OR_DEPARTMENT_OTHER): Payer: Medicare Other | Admitting: Family

## 2013-02-13 ENCOUNTER — Other Ambulatory Visit: Payer: Self-pay | Admitting: Family

## 2013-02-13 VITALS — BP 136/72 | HR 61 | Temp 98.4°F | Resp 18 | Ht 61.0 in | Wt 142.0 lb

## 2013-02-13 DIAGNOSIS — M79609 Pain in unspecified limb: Secondary | ICD-10-CM

## 2013-02-13 DIAGNOSIS — C50212 Malignant neoplasm of upper-inner quadrant of left female breast: Secondary | ICD-10-CM

## 2013-02-13 DIAGNOSIS — M810 Age-related osteoporosis without current pathological fracture: Secondary | ICD-10-CM

## 2013-02-13 DIAGNOSIS — Z853 Personal history of malignant neoplasm of breast: Secondary | ICD-10-CM

## 2013-02-13 DIAGNOSIS — C50919 Malignant neoplasm of unspecified site of unspecified female breast: Secondary | ICD-10-CM

## 2013-02-13 LAB — COMPREHENSIVE METABOLIC PANEL (CC13)
AST: 30 U/L (ref 5–34)
BUN: 8.3 mg/dL (ref 7.0–26.0)
Calcium: 10.4 mg/dL (ref 8.4–10.4)
Chloride: 104 mEq/L (ref 98–109)
Creatinine: 0.7 mg/dL (ref 0.6–1.1)
Glucose: 122 mg/dl (ref 70–140)

## 2013-02-13 LAB — CBC WITH DIFFERENTIAL/PLATELET
Basophils Absolute: 0.1 10*3/uL (ref 0.0–0.1)
Eosinophils Absolute: 0.1 10*3/uL (ref 0.0–0.5)
HGB: 14.1 g/dL (ref 11.6–15.9)
MCV: 88.2 fL (ref 79.5–101.0)
MONO#: 0.5 10*3/uL (ref 0.1–0.9)
NEUT#: 4.9 10*3/uL (ref 1.5–6.5)
RDW: 13.3 % (ref 11.2–14.5)
WBC: 7 10*3/uL (ref 3.9–10.3)
lymph#: 1.4 10*3/uL (ref 0.9–3.3)

## 2013-02-13 LAB — LACTATE DEHYDROGENASE (CC13): LDH: 140 U/L (ref 125–245)

## 2013-02-13 MED ORDER — LETROZOLE 2.5 MG PO TABS: 2.5000 mg | ORAL_TABLET | Freq: Every day | ORAL | Status: DC

## 2013-02-13 NOTE — Progress Notes (Signed)
[facility]  Telephone:([phone]) [phone] Fax:(336) [phone]  OFFICE PROGRESS NOTE   ID: [name]   DOB: 1940-10-22  MR#: [id number]  JYN#:[id number]   PCP: Crawford Givens, MD SU: Emelia Loron, MD RAD ONC: Chipper Herb, MD   HISTORY OF PRESENT ILLNESS: From Dr. Theron Arista Rubin's new patient evaluation note dated 08/18/2011: "This is a pleasant 72 year old woman , from [place], who is referred for evaluation of a breast mass. This woman and has not had a mammogram since approximately 2009. She noted a mass in the medial portion of the left breast about a year ago so. [name] sought medical attention for this and was referred for a mammogram. Bilateral mammogram left breast ultrasound performed 08/09/2011 showed a firm at 71 cm mass the parasternal left breast 1:00 position with retraction of the overlying skin. No other abnormalities were seen. Ultrasound showed a hypoechoic irregular mass with margins. This area was biopsied on the same day and showed a invasive ductal cancer ER +100% PR +17% or less for the next 20% HER-2 was nonamplified. This is felt to be grade 2. An MRI scan of both breasts performed on 08/13/2011 showed an irregular enhancing mass with spiculated margins in the lower quadrant of the left breast measuring 1.5 x 1.7 x 1.9 cm this was abutting the pectoralis muscle enhancement was seen extending along the pectoralis muscle. "  INTERVAL HISTORY: [name] was seen today for complaints of acute left upper extremity discomfort.  She was last seen by our office on 09/21/2012.  Since her last office visit, [name] says that she's experienced left upper extremity discomfort for the last 3 weeks.  She was recently diagnosed with diabetes and started on metformin in 12/2012.  Since her last office visit she has relocated to [place], [place] and resides with her daughter [name].  She wanted to downsize her home since becoming a widow in late 05/2012.   REVIEW  OF SYSTEMS: A 10 point review of systems was completed and is negative except for left upper extremity pain.  [name] denies any other symptomatology including fatigue, fever or chills, headache, vision changes, swollen glands, cough or shortness of breath, chest pain or discomfort, nausea, vomiting, diarrhea, constipation, change in urinary or bowel habits, arthralgias/myalgias, unusual bleeding/bruising or any other symptomatology.   PAST MEDICAL HISTORY: Past Medical History  Diagnosis Date  . Insomnia, unspecified   . Hypertension   . Hyperlipidemia   . GERD (gastroesophageal reflux disease)   . Anxiety   . Other abnormal glucose   . Osteoporosis, unspecified     with prev fosamax treatment  . Anemia   . Blood transfusion   . Diabetes mellitus 2011    type II, diet controlled  . Cancer 08/09/11 DX     LEFT  . Breast cancer 03/01/12    left breast lumpectomy=invasive ductal ca,grade I/III,INVILVES SKELATAL MUSCLE   er=+,pr=NEG. HER2 NEG.  . History of radiation therapy     RIGHT JAW 5 TXS FOR ABSESSED TOOTH IN HER 30'S   . Arthritis     osteoporosis  . Blood transfusion without reported diagnosis 1983    prbc  . Depression   . History of radiation therapy 04/10/12-05/26/12    left breast    PAST SURGICAL HISTORY: Past Surgical History  Procedure Laterality Date  . Bilateral oophorectomy  1987  . Tubal ligation  1975  . Orif femur fracture    . Hernia repair  1999    LIH  .  Sigmoidoscopy    . Breast lumpectomy  03/01/12    LEFT=INVASIVE DUCTAL CA,gRADE i/iii,INVASION AT PSTERIOR RESECTION MARGIN AND INVLOVES SKE;ATAL MUSCLE,PERINEURAL INVASION IDENTIFIED  . Abdominal hysterectomy  1983    b/ls alpingooph orectomy 4 years later    FAMILY HISTORY Family History  Problem Relation Age of Onset  . Diabetes Mother   . Alcohol abuse Father   . Diabetes Sister   . Cancer Brother     Bladder cancer  . Diabetes Sister     GYNECOLOGIC HISTORY: Menarche at age 53,  Menopause at age  54,  no history of hormone replacement therapy.  SOCIAL HISTORY: The patient is a recent widow was married to her late husband for 46 years.  That was her second marriage.  Her husband died unexpectedly after dental surgery in 05/2012.  The patient has 4 adult children, 2 girls and 2 boys.  She also has 4 grandchildren.  [name] recently relocated to [facility] and resides with her daughter [name].  The patient is a retired Nature conservation officer.  She enjoys doing crafts, sewing, working on the computer, and playing with her Bertram Denver terrier.    ADVANCED DIRECTIVES: Not on file  HEALTH MAINTENANCE: History  Substance Use Topics  . Smoking status: Never Smoker   . Smokeless tobacco: Never Used  . Alcohol Use: No    Colonoscopy: Not on file PAP: Not on file Bone density: The patient's last bone density scan on 08/25/2011 showed a T score of -4.9 (osteoporosis). Lipid panel: 06/17/2011   Allergies  Allergen Reactions  . Bisphosphonates     Unclear relation to meds, but pt had long bone fracture after treatment with fosamax  . Diclofenac Potassium   . Ezetimibe-Simvastatin   . Naproxen Sodium   . Nsaids   . Simvastatin   . Sulfonamide Derivatives   . Vicodin [Hydrocodone-Acetaminophen] Nausea Only    Current Outpatient Prescriptions  Medication Sig Dispense Refill  . amLODipine (NORVASC) 5 MG tablet TAKE 1 TABLET (5 MG TOTAL) BY MOUTH DAILY.  90 tablet  3  . CALCIUM CITRATE PO Take 500 mg by mouth once.      . cholecalciferol (VITAMIN D) 1000 UNITS tablet Take 1,000 Units by mouth daily.      . cyclobenzaprine (FLEXERIL) 10 MG tablet TAKE 1/2 TO1 TABLETS BY MOUTH 3 TIMES DAILY AS NEEDED MUSCLE SPASMS (SEDATION CAUTION)  30 tablet  3  . glucose blood (ACCU-CHEK SMARTVIEW) test strip Check blood sugar once daily or as directed. Dx 250.00.  H/o fluctuating blood glucose level  100 each  12  . ibuprofen (ADVIL,MOTRIN) 100 MG tablet Take  100 mg by mouth every 6 (six) hours as needed.      Marland Kitchen letrozole (FEMARA) 2.5 MG tablet Take 1 tablet (2.5 mg total) by mouth daily.  90 tablet  4  . LORazepam (ATIVAN) 1 MG tablet 1 tablet by mouth 2-3 times a day as needed for anxiety and 1-2 tabs at night for sleep  150 tablet  2  . metFORMIN (GLUCOPHAGE) 500 MG tablet Take up to 4 tabs a day, start with 1 a day for 4 days, increase by 1 pill as tolerated every 4 days.  180 tablet  12  . metoprolol (LOPRESSOR) 50 MG tablet TAKE 1 TABLET BY MOUTH TWICE A DAY  180 tablet  1  . Omega-3 Fatty Acids (FISH OIL) 1000 MG CAPS Take 1 capsule by mouth 2 (two) times daily.       Marland Kitchen  omeprazole (PRILOSEC) 20 MG capsule TAKE 1 CAPSULE BY MOUTH ONCE A DAY  90 capsule  3  . sertraline (ZOLOFT) 100 MG tablet TAKE 1 TABLET BY MOUTH ONCE A DAY  90 tablet  3   No current facility-administered medications for this visit.    OBJECTIVE: Filed Vitals:   02/13/13 1410  BP: 136/72  Pulse: 61  Temp: 98.4 F (36.9 C)  Resp: 18     Body mass index is 26.84 kg/(m^2).      ECOG FS:  Grade 1 - Symptomatic, but fully ambulatory                  General appearance: Alert, cooperative, well nourished, no apparent distress  Head: Normocephalic, without obvious abnormality, atraumatic Eyes: Arcus senilis, PERRLA, EOMI Nose: Nares, septum and mucosa are normal, no drainage or sinus tenderness Neck: No adenopathy, supple, symmetrical, trachea midline, no tenderness Resp: Clear to auscultation bilaterally Cardio: Regular rate and rhythm, S1, S2 normal, 1/6 murmur, no click, rub or gallop Breasts: Deferred  GI: Soft, distended, non-tender, hypoactive bowel sounds, no organomegaly Skin: Small skin tear/laceration on the left forearm Extremities: Extremities normal, atraumatic, no cyanosis or edema, left arm limited range of motion and small lacerations in various stages of healing on left arm, bilateral lower extremity varicose veins MS: Visible osteoporosis Lymph nodes:  Cervical and supraclavicular nodes are normal Neurologic: Grossly normal   LAB RESULTS: Lab Results  Component Value Date   WBC 7.0 02/13/2013   NEUTROABS 4.9 02/13/2013   HGB 14.1 02/13/2013   HCT 41.1 02/13/2013   MCV 88.2 02/13/2013   PLT 230 02/13/2013      Chemistry      Component Value Date/Time   NA 138 02/13/2013 1553   NA 134* 12/13/2012 1637   K 4.1 02/13/2013 1553   K 4.0 12/13/2012 1637   CL 99 12/13/2012 1637   CL 100 03/22/2012 1200   CO2 25 02/13/2013 1553   CO2 30 12/13/2012 1637   BUN 8.3 02/13/2013 1553   BUN 13 12/13/2012 1637   CREATININE 0.7 02/13/2013 1553   CREATININE 0.6 12/13/2012 1637      Component Value Date/Time   CALCIUM 10.4 02/13/2013 1553   CALCIUM 9.7 12/13/2012 1637   ALKPHOS 62 02/13/2013 1553   ALKPHOS 66 12/13/2012 1637   AST 30 02/13/2013 1553   AST 39* 12/13/2012 1637   ALT 36 02/13/2013 1553   ALT 46* 12/13/2012 1637   BILITOT 0.42 02/13/2013 1553   BILITOT 0.7 12/13/2012 1637      Lab Results  Component Value Date   LABCA2 24 12/31/2011    Urinalysis    Component Value Date/Time   COLORURINE YELLOW 10/17/2010 1330   APPEARANCEUR CLEAR 10/17/2010 1330   LABSPEC 1.005 03/22/2012 1304   LABSPEC 1.013 10/17/2010 1330   PHURINE 6.0 10/17/2010 1330   GLUCOSEU NEGATIVE 10/17/2010 1330   HGBUR TRACE* 10/17/2010 1330   BILIRUBINUR NEGATIVE 10/17/2010 1330   KETONESUR NEGATIVE 10/17/2010 1330   PROTEINUR NEGATIVE 10/17/2010 1330   UROBILINOGEN 0.2 10/17/2010 1330   NITRITE NEGATIVE 10/17/2010 1330   LEUKOCYTESUR NEGATIVE 10/17/2010 1330    STUDIES: 1.  The patient's last bilateral digital diagnostic mammogram on 12/22/2012 showed there are scattered areas of fibroglandular density.  Right breast is unchanged and negative.  On the left there is diffuse skin thickening consistent with radiation therapy.  There is a postsurgical scar in the posterior medial left breast.  There are no suspicious associated  findings.  Benign findings.  2.  The patient's last bone density scan on 08/25/2011 showed a T  score of -4.9 (osteoporosis).    ASSESSMENT: 72 y.o. [place], [place] woman: 1.  Status post left breast needle core biopsy on 08/09/2011 which showed invasive mammary carcinoma at the 9 o'clock position, estrogen receptor positive 100%, progesterone receptor positive 17%, Ki-67 20%, HER-2/neu negative by CISH with a ratio of 1.30.  2.  Neoadjuvant Femara started on 08/16/2011.  3.  Status post lateral breast MRI on 08/13/2011 which showed foci of nonspecific enhancement was seen bilaterally. An  irregular, enhancing mass with spiculated margins was seen in the lower inner quadrant of the left breast, posteriorly measuring 1.5 x 1.7 x 1.9 cm. The mass abutted the pectoralis muscle and enhancement was seen extending medially along the pectoralis muscle. Enhancement was also seen extending towards the skin where there was overlying retraction and thickening. A biopsy clip was seen in association with the mass. No other suspicious mass or enhancement was seen in either breast. No internal mammary or axillary adenopathy was present (clinical stage I, T1 N0)  4.  Status post left breast lumpectomy with sentinel node biopsy on 03/01/2012 for a stage I, ypT1c ypN0, 1.2 cm, invasive ductal carcinoma with perineural invasion, grade 1, estrogen receptor positive 99%, progesterone receptor negative 0%, Ki-67 20%, HER-2/neu negative by CISH with a ratio of 1.11, with 0/1 metastatic left axillary lymph nodes.  5.  The patient had radiation therapy from 04/10/2012 through 05/26/2012.  6.  The patient has been on antiestrogen therapy with Femara since 08/2011.  7.  Continued grief over the recent loss of her husband.  8.  Left upper extremity pain/discomfort x 3 weeks.  9.  Osteoporosis   PLAN: [name] will continue antiestrogen therapy with Femara 2.5 mg by mouth daily.  An electronic prescription for Letrozole 2.5 mg  by mouth daily #90 with 4 refills was sent to the patient's pharmacy  today.  We will schedule a bone density examination for the patient for 12/2013.  The patient is also encouraged to keep taking vitamin D 1000 IUs by mouth daily in addition to calcium by mouth daily for osteoporosis.  [name] was encouraged to consider counseling to help her with her grief regarding the recent passing of her husband.  We will avail our resources to help her get through this difficult time.  We will order a left upper extremity venous duplex to ensure the patient does not have DVT/superficial vein thrombosis.  The patient is also being referred to [facility] PT/rehabilitation clinic for left upper extremity pain/swelling/limited range of motion.  We recommended [name] also get fitted with a lymphedema sleeve/glove during her PT/rehabilitation assessment.   We plan to alternate office visits with the patient's surgeon Dr. Dwain Sarna.  We will schedule the patient to see Dr. Dwain Sarna in 08/2013 and we plan to see her again in 02/2014.  We will schedule [name]'s annual bilateral digital diagnostic mammogram in 12/2013 for her.  All questions were answered.  [name] was encouraged to contact us in the interim with any problems, questions or concerns.   Larina Bras, NP-C 02/14/2013, 4:25 PM

## 2013-02-13 NOTE — Patient Instructions (Addendum)
Please contact us at [phone] if you have any questions or concerns.  Please continue to do well and enjoy life!!!  Get plenty of rest, drink plenty of water, exercise daily (walking), eat a balanced diet.  Continue to take calcium and vitamin D3 daily.    Complete monthly self-breast examinations.  Arm: Rest your arm. Elevate as much as possible. Ice/Heat three times daily. NSAIDS twice daily for 3 days (400 mg).

## 2013-02-14 ENCOUNTER — Ambulatory Visit (HOSPITAL_COMMUNITY)
Admission: RE | Admit: 2013-02-14 | Discharge: 2013-02-14 | Disposition: A | Discharge status: Home / Self Care | Payer: Medicare Other | Source: Ambulatory Visit | Attending: Oncology | Admitting: Oncology

## 2013-02-14 ENCOUNTER — Telehealth: Payer: Self-pay | Admitting: Family

## 2013-02-14 DIAGNOSIS — Z853 Personal history of malignant neoplasm of breast: Secondary | ICD-10-CM

## 2013-02-14 DIAGNOSIS — M79609 Pain in unspecified limb: Secondary | ICD-10-CM | POA: Insufficient documentation

## 2013-02-14 NOTE — Progress Notes (Addendum)
*  Preliminary Results* Left upper extremity venous duplex completed. Left upper extremity is negative for deep and superficial vein thrombosis.  Preliminary results discussed with Sharyl Nimrod of Dr.Magrinat's office.  02/14/2013 1:29 PM  Gertie Fey, RVT, RDCS, RDMS

## 2013-02-14 NOTE — Telephone Encounter (Signed)
LMOVM the left upper extremity venous duplex was negative.  She should be getting a call from [facility] physical therapy department to begin physical therapy soon for her left upper extremity pain/discomfort/swelling/limited range of motion.  I would also like the patient to get fitted for compression sleeves/glove while at PT. Asked the patient to call if she has any questions.

## 2013-02-16 ENCOUNTER — Telehealth: Payer: Self-pay | Admitting: Oncology

## 2013-02-16 ENCOUNTER — Telehealth: Payer: Self-pay | Admitting: Family

## 2013-02-16 NOTE — Telephone Encounter (Signed)
LMOVM that her laboratories are all normal (Vitamin D level, LDH, CBC and CMP).  Call me back if she has any questions.

## 2013-02-16 NOTE — Telephone Encounter (Signed)
lmonvm advising the pt of her July mammo/bone density appt along with her sept appts with the lab and the md for 2015. S/w janice from [facility] regarding the appt with dr Dwain Sarna and she has placed you on a recall list. The [facility] will call you with an appt.

## 2013-02-16 NOTE — Telephone Encounter (Signed)
Per the referral note it seems that the physical therapy dept tried to contact this pt 1x time.

## 2013-03-11 ENCOUNTER — Other Ambulatory Visit: Payer: Self-pay | Admitting: Family Medicine

## 2013-03-12 NOTE — Telephone Encounter (Signed)
Electronic refill request.  Please advise. 

## 2013-03-12 NOTE — Telephone Encounter (Signed)
Please call in.  Thanks.   

## 2013-03-13 NOTE — Telephone Encounter (Signed)
Medication phoned to pharmacy.  

## 2013-03-19 ENCOUNTER — Other Ambulatory Visit: Payer: Medicare Other | Admitting: Lab

## 2013-03-19 ENCOUNTER — Ambulatory Visit: Payer: Medicare Other | Admitting: Oncology

## 2013-03-21 ENCOUNTER — Other Ambulatory Visit: Payer: Medicare Other

## 2013-03-27 ENCOUNTER — Encounter: Payer: Self-pay | Admitting: Radiology

## 2013-03-28 ENCOUNTER — Encounter: Payer: Self-pay | Admitting: Family Medicine

## 2013-03-28 ENCOUNTER — Ambulatory Visit (INDEPENDENT_AMBULATORY_CARE_PROVIDER_SITE_OTHER): Payer: Medicare Other | Admitting: Family Medicine

## 2013-03-28 VITALS — BP 130/78 | HR 60 | Temp 98.5°F | Ht 61.0 in | Wt 141.5 lb

## 2013-03-28 DIAGNOSIS — Z23 Encounter for immunization: Secondary | ICD-10-CM

## 2013-03-28 DIAGNOSIS — K625 Hemorrhage of anus and rectum: Secondary | ICD-10-CM

## 2013-03-28 DIAGNOSIS — IMO0001 Reserved for inherently not codable concepts without codable children: Secondary | ICD-10-CM

## 2013-03-28 DIAGNOSIS — IMO0002 Reserved for concepts with insufficient information to code with codable children: Secondary | ICD-10-CM

## 2013-03-28 DIAGNOSIS — F411 Generalized anxiety disorder: Secondary | ICD-10-CM

## 2013-03-28 DIAGNOSIS — E1165 Type 2 diabetes mellitus with hyperglycemia: Secondary | ICD-10-CM

## 2013-03-28 DIAGNOSIS — E119 Type 2 diabetes mellitus without complications: Secondary | ICD-10-CM

## 2013-03-28 NOTE — Assessment & Plan Note (Signed)
Improved by home checks.  See notes on labs, A1c and MALB.  Continue current meds for now.  Flu shot today.  We can work on other items, ie eye exam, when her A1c has been addressed.

## 2013-03-28 NOTE — Patient Instructions (Signed)
Go to the lab on the way out.  We'll contact you with your lab report. Go see Shirlee Limerick about your referral. Let's plan on a recheck in 6 months, we can get together sooner if needed.   Take care.  Glad to see you.

## 2013-03-28 NOTE — Progress Notes (Signed)
She is officially moved and settled in.  Living with family.  Sleeping better now than prev.  She has family support.  Taking lorazepam at night, some during the day.  It works for her anxiety.  She continues on SSRI.  No ADE on the meds.  We talked about the loss of her husband and making plans for the holidays.    Diabetes:  Using medications without difficulties: initially with med, but resolved now.   Hypoglycemic episodes:no Hyperglycemic episodes:no Feet problems:no Blood Sugars averaging: ~160 this AM, this is much lower than prev.   We talked about laying off sweets.  She is working on this.  I encouraged her to make substitutions.    Bloody bowel movement last week, twice on 1 day, none since.  BRBPR.  No pain.  We talked about seeing GI.  I encouraged her to see GI.    PMH and SH reviewed  Meds, vitals, and allergies reviewed.   ROS: See HPI.  Otherwise negative.    GEN: nad, alert and oriented HEENT: mucous membranes moist NECK: supple w/o LA CV: rrr. PULM: ctab, no inc wob ABD: soft, +bs EXT: no edema SKIN: no acute rash  Diabetic foot exam: Normal inspection No skin breakdown No calluses  Normal DP pulses Normal sensation to light touch and monofilament Nails normal

## 2013-03-28 NOTE — Assessment & Plan Note (Addendum)
None in the last week.  Refer to GI.

## 2013-03-28 NOTE — Assessment & Plan Note (Signed)
Continue current meds as is.  No ADE.  She is managing with family support.

## 2013-03-29 LAB — HEMOGLOBIN A1C: Hgb A1c MFr Bld: 7.5 % — ABNORMAL HIGH (ref 4.6–6.5)

## 2013-03-30 ENCOUNTER — Encounter: Payer: Self-pay | Admitting: Internal Medicine

## 2013-04-04 ENCOUNTER — Encounter: Payer: Self-pay | Admitting: Oncology

## 2013-04-04 NOTE — Progress Notes (Signed)
Patient had left a message for call back. She advised her discount expired. I called and left a message for her. Will let her know no more discount now. Must call for pmt

## 2013-04-09 ENCOUNTER — Encounter: Payer: Self-pay | Admitting: Oncology

## 2013-04-09 NOTE — Progress Notes (Signed)
Called and left a message for the patient again. I also left billing ph# for her if in regards to old discount. I advised no longer have one and must set up via billing.

## 2013-04-16 ENCOUNTER — Encounter: Payer: Self-pay | Admitting: Family Medicine

## 2013-04-17 ENCOUNTER — Other Ambulatory Visit: Payer: Self-pay | Admitting: Family Medicine

## 2013-04-17 NOTE — Telephone Encounter (Signed)
Electronic refill request.  Please advise. 

## 2013-04-17 NOTE — Telephone Encounter (Signed)
Clarify with pharmacy.  She should have refills with this already.  Thanks.  

## 2013-04-17 NOTE — Telephone Encounter (Signed)
Clarify with pharmacy.  She should have refills with this already.  Thanks.

## 2013-04-20 ENCOUNTER — Other Ambulatory Visit: Payer: Self-pay | Admitting: Family Medicine

## 2013-04-20 NOTE — Telephone Encounter (Signed)
Pharmacy did not have any refills on the 9/30 prescription.  The remainder of the refills were called in to [facility] [place] which was #150 with 1 RF.  Patient advised.

## 2013-05-02 ENCOUNTER — Ambulatory Visit: Payer: Medicare Other | Admitting: Internal Medicine

## 2013-06-25 ENCOUNTER — Encounter: Payer: Self-pay | Admitting: Internal Medicine

## 2013-06-25 ENCOUNTER — Other Ambulatory Visit: Payer: Self-pay | Admitting: Family Medicine

## 2013-06-25 NOTE — Telephone Encounter (Signed)
Electronic refill request.  Please advise. 

## 2013-06-26 NOTE — Telephone Encounter (Signed)
FYI: low risk

## 2013-06-26 NOTE — Telephone Encounter (Signed)
Medication phoned to pharmacy.  

## 2013-07-18 ENCOUNTER — Other Ambulatory Visit: Payer: Self-pay

## 2013-07-18 ENCOUNTER — Encounter: Payer: Self-pay | Admitting: Family Medicine

## 2013-07-18 MED ORDER — OMEPRAZOLE 20 MG PO CPDR: 20.0000 mg | DELAYED_RELEASE_CAPSULE | Freq: Every day | ORAL | Status: DC

## 2013-07-19 ENCOUNTER — Encounter: Payer: Self-pay | Admitting: Internal Medicine

## 2013-07-19 ENCOUNTER — Ambulatory Visit (INDEPENDENT_AMBULATORY_CARE_PROVIDER_SITE_OTHER): Payer: Medicare Other | Admitting: Internal Medicine

## 2013-07-19 VITALS — BP 148/64 | HR 68 | Ht 60.63 in | Wt 143.0 lb

## 2013-07-19 DIAGNOSIS — E119 Type 2 diabetes mellitus without complications: Secondary | ICD-10-CM

## 2013-07-19 DIAGNOSIS — K625 Hemorrhage of anus and rectum: Secondary | ICD-10-CM

## 2013-07-19 MED ORDER — MOVIPREP 100 G PO SOLR: 1.0000 | Freq: Once | ORAL | Status: DC

## 2013-07-19 NOTE — Progress Notes (Signed)
HISTORY OF PRESENT ILLNESS:  [name] is a 73 y.o. female with multiple medical problems as listed below. She is sent today by her primary provider regarding rectal bleeding. Patient has no prior history of GI problems (except for diverticulitis) or GI evaluations, though she does report a remote sigmoidoscopy revealing diverticulosis. She reports being in her usual state of health until 06/08/2013 when while in [place] she developed significant rectal bleeding with defecation. Red blood discolored the toilet water on 2 consecutive occasions. One more occasion since. None recently. She denies having had associated abdominal pain or rectal pain. She denies change in bowel habits or weight loss. She does a history of acid reflux for which she takes omeprazole with good results. No history of dysphagia.  REVIEW OF SYSTEMS:  All non-GI ROS negative except for anxiety  Past Medical History  Diagnosis Date  . Insomnia, unspecified   . Hypertension   . Hyperlipidemia   . GERD (gastroesophageal reflux disease)   . Anxiety   . Other abnormal glucose   . Osteoporosis, unspecified     with prev fosamax treatment  . Anemia   . Blood transfusion   . Diabetes mellitus 2011    type II, diet controlled  . Cancer 08/09/11 DX     LEFT  . Breast cancer 03/01/12    left breast lumpectomy=invasive ductal ca,grade I/III,INVILVES SKELATAL MUSCLE   er=+,pr=NEG. HER2 NEG.  . History of radiation therapy     RIGHT JAW 5 TXS FOR ABSESSED TOOTH IN HER 30'S   . Arthritis     osteoporosis  . Blood transfusion without reported diagnosis 1983    prbc  . Depression   . History of radiation therapy 04/10/12-05/26/12    left breast  . Diverticulosis     Past Surgical History  Procedure Laterality Date  . Bilateral oophorectomy  1987  . Tubal ligation  1975  . Orif femur fracture Right     titanium rod  . Inguinal hernia repair Left 1999    LIH  . Sigmoidoscopy    . Breast lumpectomy  03/01/12     LEFT=INVASIVE DUCTAL CA,gRADE i/iii,INVASION AT PSTERIOR RESECTION MARGIN AND INVLOVES SKE;ATAL MUSCLE,PERINEURAL INVASION IDENTIFIED  . Abdominal hysterectomy  1983    b/ls alpingooph orectomy 4 years later    Social History [name]  reports that she has never smoked. She has never used smokeless tobacco. She reports that she does not drink alcohol or use illicit drugs.  family history includes Alcohol abuse in her father; Bladder Cancer in her brother and son; Diabetes in her mother, sister, and sister.  Allergies  Allergen Reactions  . Bisphosphonates     Unclear relation to meds, but pt had long bone fracture after treatment with fosamax  . Diclofenac Potassium   . Ezetimibe-Simvastatin   . Naproxen Sodium   . Nsaids   . Simvastatin   . Sulfonamide Derivatives   . Vicodin [Hydrocodone-Acetaminophen] Nausea Only       PHYSICAL EXAMINATION: Vital signs: BP 148/64  Pulse 68  Ht 5' 0.63" (1.54 m)  Wt 143 lb (64.864 kg)  BMI 27.35 kg/m2  Constitutional: generally well-appearing, no acute distress Psychiatric: alert and oriented x3, cooperative Eyes: extraocular movements intact, anicteric, conjunctiva pink Mouth: oral pharynx moist, no lesions Neck: supple no lymphadenopathy Cardiovascular: heart regular rate and rhythm, no murmur Lungs: clear to auscultation bilaterally Abdomen: soft, nontender, nondistended, no obvious ascites, no peritoneal signs, normal bowel sounds, no organomegaly Rectal: Deferred  until colonoscopy Extremities: no lower extremity edema bilaterally Skin: no lesions on visible extremities Neuro: No focal deficits.   ASSESSMENT:  #1. Rectal bleeding as described #2. Multiple medical problems including diabetes   PLAN:   #1. Colonoscopy.The nature of the procedure, as well as the risks, benefits, and alternatives were carefully and thoroughly reviewed with the patient. Ample time for discussion and questions allowed. The patient  understood, was satisfied, and agreed to proceed. Movi prep prescribed. The patient instructed on its use. #2. Hold diabetic medications the day of the procedure

## 2013-07-19 NOTE — Patient Instructions (Signed)

## 2013-07-26 ENCOUNTER — Encounter: Payer: Self-pay | Admitting: Internal Medicine

## 2013-07-27 ENCOUNTER — Other Ambulatory Visit: Payer: Self-pay | Admitting: Family Medicine

## 2013-07-27 ENCOUNTER — Telehealth: Payer: Self-pay | Admitting: Internal Medicine

## 2013-07-27 NOTE — Telephone Encounter (Signed)
Pt states she has a bad cold and wanted to know if she can still have her procedure.Discussed with pt that if she is running a fever we would not do procedure. Pt does not think she has a fever. Instructed her to let us know if she developed a fever over the weekend or on Monday. She verbalized understanding.

## 2013-07-29 NOTE — Telephone Encounter (Signed)
Please call in.  Thanks.   

## 2013-07-30 ENCOUNTER — Telehealth: Payer: Self-pay | Admitting: Internal Medicine

## 2013-07-30 NOTE — Telephone Encounter (Signed)
Medication phoned to pharmacy.  

## 2013-07-31 ENCOUNTER — Encounter: Payer: Medicare Other | Admitting: Internal Medicine

## 2013-08-08 ENCOUNTER — Ambulatory Visit (AMBULATORY_SURGERY_CENTER): Payer: Medicare Other | Admitting: Internal Medicine

## 2013-08-08 ENCOUNTER — Encounter: Payer: Self-pay | Admitting: Internal Medicine

## 2013-08-08 VITALS — BP 149/71 | HR 56 | Temp 97.2°F | Resp 15 | Ht 60.63 in | Wt 143.0 lb

## 2013-08-08 DIAGNOSIS — D126 Benign neoplasm of colon, unspecified: Secondary | ICD-10-CM

## 2013-08-08 DIAGNOSIS — K625 Hemorrhage of anus and rectum: Secondary | ICD-10-CM

## 2013-08-08 DIAGNOSIS — K5730 Diverticulosis of large intestine without perforation or abscess without bleeding: Secondary | ICD-10-CM

## 2013-08-08 LAB — GLUCOSE, CAPILLARY
Glucose-Capillary: 164 mg/dL — ABNORMAL HIGH (ref 70–99)
Glucose-Capillary: 145 mg/dL — ABNORMAL HIGH (ref 70–99)

## 2013-08-08 MED ORDER — SODIUM CHLORIDE 0.9 % IV SOLN: 500.0000 mL | INTRAVENOUS | Status: DC

## 2013-08-08 NOTE — Patient Instructions (Signed)
YOU HAD AN ENDOSCOPIC PROCEDURE TODAY AT THE [facility]: Refer to the procedure report that was given to you for any specific questions about what was found during the examination.  If the procedure report does not answer your questions, please call your gastroenterologist to clarify.  If you requested that your care partner not be given the details of your procedure findings, then the procedure report has been included in a sealed envelope for you to review at your convenience later.  YOU SHOULD EXPECT: Some feelings of bloating in the abdomen. Passage of more gas than usual.  Walking can help get rid of the air that was put into your GI tract during the procedure and reduce the bloating. If you had a lower endoscopy (such as a colonoscopy or flexible sigmoidoscopy) you may notice spotting of blood in your stool or on the toilet paper. If you underwent a bowel prep for your procedure, then you may not have a normal bowel movement for a few days.  DIET: Your first meal following the procedure should be a light meal and then it is ok to progress to your normal diet.  A half-sandwich or bowl of soup is an example of a good first meal.  Heavy or fried foods are harder to digest and may make you feel nauseous or bloated.  Likewise meals heavy in dairy and vegetables can cause extra gas to form and this can also increase the bloating.  Drink plenty of fluids but you should avoid alcoholic beverages for 24 hours.  ACTIVITY: Your care partner should take you home directly after the procedure.  You should plan to take it easy, moving slowly for the rest of the day.  You can resume normal activity the day after the procedure however you should NOT DRIVE or use heavy machinery for 24 hours (because of the sedation medicines used during the test).    SYMPTOMS TO REPORT IMMEDIATELY: A gastroenterologist can be reached at any hour.  During normal business hours, 8:30 AM to 5:00 PM Monday through Friday,  call [phone].  After hours and on weekends, please call the GI answering service at [phone] who will take a message and have the physician on call contact you.   Following lower endoscopy (colonoscopy or flexible sigmoidoscopy):  Excessive amounts of blood in the stool  Significant tenderness or worsening of abdominal pains  Swelling of the abdomen that is new, acute  Fever of 100F or higher    FOLLOW UP: If any biopsies were taken you will be contacted by phone or by letter within the next 1-3 weeks.  Call your gastroenterologist if you have not heard about the biopsies in 3 weeks.  Our staff will call the home number listed on your records the next business day following your procedure to check on you and address any questions or concerns that you may have at that time regarding the information given to you following your procedure. This is a courtesy call and so if there is no answer at the home number and we have not heard from you through the emergency physician on call, we will assume that you have returned to your regular daily activities without incident.  SIGNATURES/CONFIDENTIALITY: You and/or your care partner have signed paperwork which will be entered into your electronic medical record.  These signatures attest to the fact that that the information above on your After Visit Summary has been reviewed and is understood.  Full responsibility of the confidentiality   this discharge information lies with you and/or your care-partner.  INFORMATION ON POLYPS AND DIVERTICULOSIS GIVEN TO YOU TODAY 

## 2013-08-08 NOTE — Progress Notes (Signed)
Pt dx with bronchitis this past staturday.  Taking z-pack.  Took that yesterday, not today.  Per pt she feels better now.  Not coughing as much.  She has one more pill to take. Maw

## 2013-08-08 NOTE — Op Note (Signed)
[facility]  [address] [place], [address]   COLONOSCOPY PROCEDURE REPORT  PATIENT: [name], [name]  MR#: [id number] BIRTHDATE: 05/29/1941 , 72  yrs. old GENDER: Female ENDOSCOPIST: Eustace Quail, MD REFERRED FB:PZWCHE Duncan, [name] PROCEDURE DATE:  08/08/2013 PROCEDURE:   Colonoscopy with snare polypectomy x1 First Screening Colonoscopy - Avg.  risk and is 50 yrs.  old or older - No.  Prior Negative Screening - Now for repeat screening. N/A  History of Adenoma - Now for follow-up colonoscopy & has been > or = to 3 yrs.  N/A  Polyps Removed Today? Yes. ASA CLASS:   Class II INDICATIONS:Rectal Bleeding. MEDICATIONS: MAC sedation, administered by CRNA and propofol (Diprivan) 300mg  IV  DESCRIPTION OF PROCEDURE:   After the risks benefits and alternatives of the procedure were thoroughly explained, informed consent was obtained.  A digital rectal exam revealed no abnormalities of the rectum.   The LB [id number]  endoscope was introduced through the anus and advanced to the cecum, which was identified by both the appendix and ileocecal valve. No adverse events experienced.   The quality of the prep was excellent, using MoviPrep  The instrument was then slowly withdrawn as the colon was fully examined.  COLON FINDINGS: A diminutive polyp was found at the ileocecal valve. A polypectomy was performed with a cold snare.  The resection was complete and the polyp tissue was completely retrieved.   Severe diverticulosis was noted throughout the entire examined colon. The colon mucosa was otherwise normal.  Retroflexed views revealed internal hemorrhoids. The time to cecum=8 minutes 12 seconds. Withdrawal time=8 minutes 13 seconds.  The scope was withdrawn and the procedure completed. COMPLICATIONS: There were no complications.  ENDOSCOPIC IMPRESSION: 1.   Diminutive polyp was found at the ileocecal valve; polypectomy was performed with a cold snare 2.    Severe diverticulosis was noted throughout the entire examined colon 3.   The colon mucosa was otherwise normal  RECOMMENDATIONS: 1. Return to the care of your primary provider.  GI follow up as needed   eSigned:  Eustace Quail, MD 08/08/2013 3:24 PM   cc: Elsie Stain, MD and The Patient

## 2013-08-08 NOTE — Progress Notes (Signed)
Called to room to assist during endoscopic procedure.  Patient ID and intended procedure confirmed with present staff. Received instructions for my participation in the procedure from the performing physician.  

## 2013-08-08 NOTE — Progress Notes (Signed)
Dr. Henrene Pastor made aware of pt being tx for bronchitis.  Will proceed with procedure as planned. maw

## 2013-08-10 ENCOUNTER — Telehealth: Payer: Self-pay | Admitting: *Deleted

## 2013-08-10 ENCOUNTER — Other Ambulatory Visit: Payer: Self-pay | Admitting: Family Medicine

## 2013-08-10 NOTE — Telephone Encounter (Signed)
  Follow up Call-  Call back number 08/08/2013  Post procedure Call Back phone  # [phone] cell  Permission to leave phone message Yes     No answer at # given.  Message left on VM.

## 2013-08-14 ENCOUNTER — Encounter: Payer: Self-pay | Admitting: Internal Medicine

## 2013-08-27 ENCOUNTER — Other Ambulatory Visit: Payer: Medicare Other

## 2013-08-29 ENCOUNTER — Encounter: Payer: Medicare Other | Admitting: Family Medicine

## 2013-09-01 ENCOUNTER — Other Ambulatory Visit: Payer: Self-pay | Admitting: Family Medicine

## 2013-09-03 NOTE — Telephone Encounter (Signed)
Sent!

## 2013-09-03 NOTE — Telephone Encounter (Signed)
Electronic refill request. Last filled 30 tablets with 3RF on  12/13/2012.  Please advise.

## 2013-09-05 ENCOUNTER — Other Ambulatory Visit: Payer: Self-pay | Admitting: Family Medicine

## 2013-09-05 NOTE — Telephone Encounter (Signed)
Received refill request electronically. Last refill 08/21/12 #90/3 refills, last office visit 03/28/13. Is it okay to refill medication?

## 2013-09-05 NOTE — Telephone Encounter (Signed)
Sent. Would continue.  Thanks.  

## 2013-09-27 ENCOUNTER — Other Ambulatory Visit: Payer: Self-pay | Admitting: Family Medicine

## 2013-09-27 DIAGNOSIS — E1165 Type 2 diabetes mellitus with hyperglycemia: Principal | ICD-10-CM

## 2013-09-27 DIAGNOSIS — IMO0001 Reserved for inherently not codable concepts without codable children: Secondary | ICD-10-CM

## 2013-10-02 ENCOUNTER — Other Ambulatory Visit: Payer: Medicare Other

## 2013-10-05 ENCOUNTER — Ambulatory Visit (INDEPENDENT_AMBULATORY_CARE_PROVIDER_SITE_OTHER)
Admission: RE | Admit: 2013-10-05 | Discharge: 2013-10-05 | Disposition: A | Discharge status: Home / Self Care | Payer: Medicare Other | Source: Ambulatory Visit | Attending: Family Medicine | Admitting: Family Medicine

## 2013-10-05 ENCOUNTER — Other Ambulatory Visit: Payer: Medicare Other

## 2013-10-05 ENCOUNTER — Ambulatory Visit (INDEPENDENT_AMBULATORY_CARE_PROVIDER_SITE_OTHER): Payer: Medicare Other | Admitting: Family Medicine

## 2013-10-05 ENCOUNTER — Encounter: Payer: Self-pay | Admitting: Family Medicine

## 2013-10-05 VITALS — BP 166/74 | HR 66 | Temp 98.1°F | Ht 61.5 in | Wt 141.0 lb

## 2013-10-05 DIAGNOSIS — IMO0002 Reserved for concepts with insufficient information to code with codable children: Secondary | ICD-10-CM

## 2013-10-05 DIAGNOSIS — E119 Type 2 diabetes mellitus without complications: Secondary | ICD-10-CM

## 2013-10-05 DIAGNOSIS — IMO0001 Reserved for inherently not codable concepts without codable children: Secondary | ICD-10-CM

## 2013-10-05 DIAGNOSIS — E785 Hyperlipidemia, unspecified: Secondary | ICD-10-CM

## 2013-10-05 DIAGNOSIS — E1165 Type 2 diabetes mellitus with hyperglycemia: Secondary | ICD-10-CM

## 2013-10-05 DIAGNOSIS — M25519 Pain in unspecified shoulder: Secondary | ICD-10-CM

## 2013-10-05 DIAGNOSIS — I10 Essential (primary) hypertension: Secondary | ICD-10-CM

## 2013-10-05 DIAGNOSIS — Z0000 Encounter for general adult medical examination without abnormal findings: Secondary | ICD-10-CM

## 2013-10-05 DIAGNOSIS — F411 Generalized anxiety disorder: Secondary | ICD-10-CM

## 2013-10-05 MED ORDER — CYCLOBENZAPRINE HCL 10 MG PO TABS: ORAL_TABLET | ORAL | Status: DC

## 2013-10-05 NOTE — Progress Notes (Signed)
Pre visit review using our clinic review tool, if applicable. No additional management support is needed unless otherwise documented below in the visit note.  I have personally reviewed the Medicare Annual Wellness questionnaire and have noted 1. The patient's medical and social history 2. Their use of alcohol, tobacco or illicit drugs 3. Their current medications and supplements 4. The patient's functional ability including ADL's, fall risks, home safety risks and hearing or visual             impairment. 5. Diet and physical activities 6. Evidence for depression or mood disorders  The patients weight, height, BMI have been recorded in the chart and visual acuity is per eye clinic.  I have made referrals, counseling and provided education to the patient based review of the above and I have provided the pt with a written personalized care plan for preventive services.  See scanned forms.  Routine anticipatory guidance given to patient.  See health maintenance. Flu 2014 Shingles d/w pt.  PNA 2012 Tetanus 2009 Colonoscopy 2015 Breast cancer screening- 2014 Advance directive- daughter Otis Peak designated if patient were incapacitated.  Cognitive function addressed- see scanned forms- and if abnormal then additional documentation follows.  DXA pending.   Anxiety- controlled with current meds.  She wanted to change to the 2mg  tabs, but not change her total daily dose. She'll let me know when she is running low.  No ADE on meds.    L shoulder pain, no trauma.  No swelling or bruising.  Some better with flexeril.    Hypertension:    Using medication without problems or lightheadedness: yes Chest pain with exertion:no Edema:no Short of breath:no  Diabetes:  Using medications without difficulties:yes Hypoglycemic episodes:no Hyperglycemic episodes:no Feet problems:no Blood Sugars averaging:~130-160 before meals.  eye exam within last year:yes  PMH and SH reviewed  Meds, vitals, and  allergies reviewed.   ROS: See HPI.  Otherwise negative.    GEN: nad, alert and oriented HEENT: mucous membranes moist NECK: supple w/o LA CV: rrr. PULM: ctab, no inc wob ABD: soft, +bs EXT: no edema SKIN: no acute rash  Diabetic foot exam: Normal inspection No skin breakdown No calluses  Normal DP pulses Normal sensation to light touch and monofilament Nails normal

## 2013-10-05 NOTE — Patient Instructions (Addendum)
Go to the lab on the way out.  We'll contact you with your lab and xray reports.  Check on getting an eye exam for diabetes.   Check with your insurance to see if they will cover the shingles shot. When you finish your current rx for lorazepam, we can change the strength so you'll need less pills.  Check your BP at home.  If consistently >140/>90, then notify us.  Recheck in about 6 months.   Glad to see you.

## 2013-10-06 LAB — LIPID PANEL
CHOL/HDL RATIO: 6.5 ratio
CHOLESTEROL: 194 mg/dL (ref 0–200)
HDL: 30 mg/dL — ABNORMAL LOW (ref >39)
LDL Cholesterol: 128 mg/dL — ABNORMAL HIGH (ref 0–99)
Triglycerides: 179 mg/dL — ABNORMAL HIGH (ref <150)
VLDL: 36 mg/dL (ref 0–40)

## 2013-10-06 LAB — COMPREHENSIVE METABOLIC PANEL
ALT: 36 U/L — ABNORMAL HIGH (ref 0–35)
AST: 34 U/L (ref 0–37)
Albumin: 4.4 g/dL (ref 3.5–5.2)
Alkaline Phosphatase: 53 U/L (ref 39–117)
BILIRUBIN TOTAL: 0.5 mg/dL (ref 0.2–1.2)
BUN: 11 mg/dL (ref 6–23)
CO2: 26 meq/L (ref 19–32)
CREATININE: 0.52 mg/dL (ref 0.50–1.10)
Calcium: 9.8 mg/dL (ref 8.4–10.5)
Chloride: 99 mEq/L (ref 96–112)
GLUCOSE: 119 mg/dL — AB (ref 70–99)
Potassium: 3.8 mEq/L (ref 3.5–5.3)
Sodium: 135 mEq/L (ref 135–145)
Total Protein: 7.4 g/dL (ref 6.0–8.3)

## 2013-10-06 LAB — HEMOGLOBIN A1C
Hgb A1c MFr Bld: 8.1 % — ABNORMAL HIGH (ref <5.7)
Mean Plasma Glucose: 186 mg/dL — ABNORMAL HIGH (ref <117)

## 2013-10-06 LAB — MICROALBUMIN / CREATININE URINE RATIO
Creatinine, Urine: 34.7 mg/dL
MICROALB/CREAT RATIO: 14.4 mg/g (ref 0.0–30.0)
Microalb, Ur: 0.5 mg/dL (ref 0.00–1.89)

## 2013-10-08 ENCOUNTER — Encounter: Payer: Self-pay | Admitting: *Deleted

## 2013-10-08 DIAGNOSIS — Z0000 Encounter for general adult medical examination without abnormal findings: Secondary | ICD-10-CM | POA: Insufficient documentation

## 2013-10-08 NOTE — Assessment & Plan Note (Signed)
No impingement today on exam.  See notes on xrays.  Flexeril does help.  Not point tender today on exam.

## 2013-10-08 NOTE — Assessment & Plan Note (Signed)
See notes on labs.  Reasonable control given her age, no change in meds. Will follow periodically.

## 2013-10-08 NOTE — Assessment & Plan Note (Signed)
See scanned forms.  Routine anticipatory guidance given to patient.  See health maintenance. Flu 2014 Shingles d/w pt.  PNA 2012 Tetanus 2009 Colonoscopy 2015 Breast cancer screening- 2014 Advance directive- daughter [name] designated if patient were incapacitated.  Cognitive function addressed- see scanned forms- and if abnormal then additional documentation follows.  DXA pending.

## 2013-10-08 NOTE — Assessment & Plan Note (Signed)
Is controlled with current meds. She wanted to change to the 2mg  BZD tabs, but not change her total daily dose. She'll let me know when she is running low. No ADE on meds.

## 2013-10-08 NOTE — Assessment & Plan Note (Signed)
Prev statin intolerance, continue as is.

## 2013-10-08 NOTE — Assessment & Plan Note (Signed)
She'll notify me if BP elevated on home checks.  Continue current meds.

## 2013-10-09 ENCOUNTER — Encounter (INDEPENDENT_AMBULATORY_CARE_PROVIDER_SITE_OTHER): Payer: Self-pay | Admitting: General Surgery

## 2013-10-09 ENCOUNTER — Ambulatory Visit (INDEPENDENT_AMBULATORY_CARE_PROVIDER_SITE_OTHER): Payer: Medicare Other | Admitting: General Surgery

## 2013-10-09 VITALS — BP 124/76 | HR 68 | Temp 98.3°F | Resp 14 | Ht 61.0 in | Wt 143.0 lb

## 2013-10-09 DIAGNOSIS — C50219 Malignant neoplasm of upper-inner quadrant of unspecified female breast: Secondary | ICD-10-CM

## 2013-10-09 NOTE — Patient Instructions (Signed)

## 2013-10-09 NOTE — Progress Notes (Signed)
Subjective:     Patient ID: [name], female   DOB: 06-30-1940, 73 y.o.   MRN: [id number]  HPI 34 yof who underwent primary endocrine therapy followed by lumpectomy/snbx for stage I breast cancer. This was followed by radiation therapy and then back on letrozole which she is tolerating well.  She is due a mm in July, her last was birads 2.  She is also getting a bone density scan also.  She has no complaints referable to her breast or her arm.  She comes in today for follow up. She is living with her daughter now in [place].  Review of Systems     Objective:   Physical Exam  Vitals reviewed. Constitutional: She appears well-developed and well-nourished.  Neck: Neck supple.  Pulmonary/Chest: Right breast exhibits no inverted nipple, no mass, no nipple discharge, no skin change and no tenderness. Left breast exhibits inverted nipple (longstanding). Left breast exhibits no mass, no nipple discharge, no skin change and no tenderness.    Lymphadenopathy:    She has no cervical adenopathy.    She has no axillary adenopathy.       Right: No supraclavicular adenopathy present.       Left: No supraclavicular adenopathy present.       Assessment:     Stage I left breast cancer    Plan:     She has no clinical evidence of recurrence.  She will continue her own self exams, get her mm in July.  She will continue letrozole.  She has been active with walking program and will continue that also.  I will see her back in one year

## 2013-10-12 ENCOUNTER — Other Ambulatory Visit: Payer: Self-pay | Admitting: Family Medicine

## 2013-10-15 NOTE — Telephone Encounter (Signed)
PT NOT BILLED COLON CX FEE-WEATHER

## 2013-11-02 ENCOUNTER — Encounter: Payer: Medicare Other | Admitting: Family Medicine

## 2013-11-06 ENCOUNTER — Other Ambulatory Visit: Payer: Self-pay | Admitting: Family Medicine

## 2013-11-06 NOTE — Telephone Encounter (Signed)
Electronic refill request. Last Filled:   150 tablet 1 RF on  08/04/13.  Please advise.

## 2013-11-07 NOTE — Telephone Encounter (Signed)
Please call in and notify pt. She wanted to change to the 2mg  tabs, but not change her total daily dose.  I did that on the rx.  Note the sig and dose change- her daily dose and amount per dose is the same, she'll just have fewer pills. Thanks.

## 2013-11-07 NOTE — Telephone Encounter (Signed)
Patient notified as instructed by telephone. Rx called to pharmacy as instructed.

## 2013-11-20 ENCOUNTER — Other Ambulatory Visit: Payer: Self-pay | Admitting: Family Medicine

## 2013-12-07 ENCOUNTER — Other Ambulatory Visit: Payer: Self-pay | Admitting: *Deleted

## 2013-12-07 MED ORDER — AMLODIPINE BESYLATE 5 MG PO TABS: ORAL_TABLET | ORAL | Status: DC

## 2013-12-19 ENCOUNTER — Other Ambulatory Visit: Payer: Self-pay

## 2013-12-19 ENCOUNTER — Other Ambulatory Visit: Payer: Self-pay | Admitting: Family

## 2013-12-19 DIAGNOSIS — Z853 Personal history of malignant neoplasm of breast: Secondary | ICD-10-CM

## 2013-12-19 DIAGNOSIS — C50212 Malignant neoplasm of upper-inner quadrant of left female breast: Secondary | ICD-10-CM

## 2013-12-21 ENCOUNTER — Other Ambulatory Visit: Payer: Self-pay | Admitting: Oncology

## 2013-12-21 DIAGNOSIS — Z853 Personal history of malignant neoplasm of breast: Secondary | ICD-10-CM

## 2013-12-21 DIAGNOSIS — C50212 Malignant neoplasm of upper-inner quadrant of left female breast: Secondary | ICD-10-CM

## 2013-12-24 ENCOUNTER — Ambulatory Visit
Admission: RE | Admit: 2013-12-24 | Discharge: 2013-12-24 | Disposition: A | Discharge status: Home / Self Care | Payer: Medicare Other | Source: Ambulatory Visit | Attending: Family | Admitting: Family

## 2013-12-24 ENCOUNTER — Telehealth: Payer: Self-pay | Admitting: *Deleted

## 2013-12-24 DIAGNOSIS — Z853 Personal history of malignant neoplasm of breast: Secondary | ICD-10-CM

## 2013-12-24 DIAGNOSIS — C50212 Malignant neoplasm of upper-inner quadrant of left female breast: Secondary | ICD-10-CM

## 2013-12-24 NOTE — Telephone Encounter (Signed)
With the [facility] called requesting signed order via [facility] or faxed signed order to [phone].  Will notify providers.

## 2014-01-07 ENCOUNTER — Other Ambulatory Visit: Payer: Self-pay | Admitting: Family Medicine

## 2014-01-20 ENCOUNTER — Other Ambulatory Visit: Payer: Self-pay | Admitting: Family Medicine

## 2014-01-21 NOTE — Telephone Encounter (Signed)
Electronic refill request

## 2014-01-22 NOTE — Telephone Encounter (Signed)
Medication phoned to pharmacy.  

## 2014-01-22 NOTE — Telephone Encounter (Signed)
Please call in.  Thanks.   

## 2014-01-28 ENCOUNTER — Encounter: Payer: Self-pay | Admitting: Family Medicine

## 2014-01-28 ENCOUNTER — Telehealth: Payer: Self-pay | Admitting: Family Medicine

## 2014-01-28 DIAGNOSIS — Z634 Disappearance and death of family member: Secondary | ICD-10-CM | POA: Insufficient documentation

## 2014-01-28 NOTE — Telephone Encounter (Signed)
Called patient and LMOVM, offered condolences re: grandson's death.

## 2014-01-30 ENCOUNTER — Telehealth: Payer: Self-pay | Admitting: Family Medicine

## 2014-01-30 NOTE — Telephone Encounter (Signed)
Called pt re: reported death of grandson.  Late entry.  LMOVM x2.  I left message for her to call me back if I can be of service.  I will defer o/w for now.

## 2014-01-30 NOTE — Telephone Encounter (Signed)
Called and LMVOM at daughter's number Graciella Belton).

## 2014-02-11 ENCOUNTER — Telehealth: Payer: Self-pay | Admitting: Oncology

## 2014-02-11 NOTE — Telephone Encounter (Signed)
cld & left pt message for appt on 9/3-adv will be seeing HF per GM-adv of new time for appt

## 2014-02-13 ENCOUNTER — Other Ambulatory Visit: Payer: Self-pay | Admitting: *Deleted

## 2014-02-13 DIAGNOSIS — M810 Age-related osteoporosis without current pathological fracture: Secondary | ICD-10-CM

## 2014-02-13 DIAGNOSIS — C50219 Malignant neoplasm of upper-inner quadrant of unspecified female breast: Secondary | ICD-10-CM

## 2014-02-14 ENCOUNTER — Ambulatory Visit (HOSPITAL_BASED_OUTPATIENT_CLINIC_OR_DEPARTMENT_OTHER): Payer: Medicare Other | Admitting: Nurse Practitioner

## 2014-02-14 ENCOUNTER — Telehealth: Payer: Self-pay | Admitting: Oncology

## 2014-02-14 ENCOUNTER — Other Ambulatory Visit: Payer: Self-pay | Admitting: Family Medicine

## 2014-02-14 ENCOUNTER — Other Ambulatory Visit (HOSPITAL_BASED_OUTPATIENT_CLINIC_OR_DEPARTMENT_OTHER): Payer: Medicare Other

## 2014-02-14 ENCOUNTER — Encounter: Payer: Self-pay | Admitting: Nurse Practitioner

## 2014-02-14 ENCOUNTER — Ambulatory Visit: Payer: Medicare Other | Admitting: Oncology

## 2014-02-14 ENCOUNTER — Other Ambulatory Visit: Payer: Medicare Other

## 2014-02-14 VITALS — BP 166/78 | HR 53 | Temp 98.3°F | Resp 18 | Ht 61.0 in | Wt 142.9 lb

## 2014-02-14 DIAGNOSIS — Z853 Personal history of malignant neoplasm of breast: Secondary | ICD-10-CM

## 2014-02-14 DIAGNOSIS — Z79811 Long term (current) use of aromatase inhibitors: Secondary | ICD-10-CM

## 2014-02-14 DIAGNOSIS — M810 Age-related osteoporosis without current pathological fracture: Secondary | ICD-10-CM

## 2014-02-14 DIAGNOSIS — Z23 Encounter for immunization: Secondary | ICD-10-CM

## 2014-02-14 DIAGNOSIS — C50219 Malignant neoplasm of upper-inner quadrant of unspecified female breast: Secondary | ICD-10-CM

## 2014-02-14 DIAGNOSIS — C50912 Malignant neoplasm of unspecified site of left female breast: Secondary | ICD-10-CM

## 2014-02-14 LAB — COMPREHENSIVE METABOLIC PANEL (CC13)
ALBUMIN: 3.7 g/dL (ref 3.5–5.0)
ALT: 45 U/L (ref 0–55)
ANION GAP: 8 meq/L (ref 3–11)
AST: 46 U/L — AB (ref 5–34)
Alkaline Phosphatase: 60 U/L (ref 40–150)
BUN: 8.9 mg/dL (ref 7.0–26.0)
CALCIUM: 9.7 mg/dL (ref 8.4–10.4)
CHLORIDE: 104 meq/L (ref 98–109)
CO2: 26 meq/L (ref 22–29)
Creatinine: 0.7 mg/dL (ref 0.6–1.1)
Glucose: 196 mg/dl — ABNORMAL HIGH (ref 70–140)
POTASSIUM: 3.8 meq/L (ref 3.5–5.1)
Sodium: 138 mEq/L (ref 136–145)
TOTAL PROTEIN: 7.7 g/dL (ref 6.4–8.3)
Total Bilirubin: 0.25 mg/dL (ref 0.20–1.20)

## 2014-02-14 LAB — CBC WITH DIFFERENTIAL/PLATELET
BASO%: 1.3 % (ref 0.0–2.0)
Basophils Absolute: 0.1 10*3/uL (ref 0.0–0.1)
EOS%: 1.5 % (ref 0.0–7.0)
Eosinophils Absolute: 0.1 10*3/uL (ref 0.0–0.5)
HEMATOCRIT: 40.8 % (ref 34.8–46.6)
HEMOGLOBIN: 13.6 g/dL (ref 11.6–15.9)
LYMPH%: 17.9 % (ref 14.0–49.7)
MCH: 30.2 pg (ref 25.1–34.0)
MCHC: 33.4 g/dL (ref 31.5–36.0)
MCV: 90.2 fL (ref 79.5–101.0)
MONO#: 0.4 10*3/uL (ref 0.1–0.9)
MONO%: 6.1 % (ref 0.0–14.0)
NEUT#: 5.1 10*3/uL (ref 1.5–6.5)
NEUT%: 73.2 % (ref 38.4–76.8)
Platelets: 217 10*3/uL (ref 145–400)
RBC: 4.52 10*6/uL (ref 3.70–5.45)
RDW: 12.5 % (ref 11.2–14.5)
WBC: 7 10*3/uL (ref 3.9–10.3)
lymph#: 1.3 10*3/uL (ref 0.9–3.3)

## 2014-02-14 LAB — LACTATE DEHYDROGENASE (CC13): LDH: 154 U/L (ref 125–245)

## 2014-02-14 MED ORDER — INFLUENZA VAC SPLIT QUAD 0.5 ML IM SUSY
0.5000 mL | PREFILLED_SYRINGE | Freq: Once | INTRAMUSCULAR | Status: AC
  Administered 2014-02-14: 0.5 mL via INTRAMUSCULAR
  Filled 2014-02-14: qty 0.5

## 2014-02-14 NOTE — Telephone Encounter (Signed)
, °

## 2014-02-14 NOTE — Progress Notes (Signed)
[name]  [phone] [phone]  OFFICE PROGRESS NOTE   ID: [name]   DOB: 1941/03/12  MR#: [id number]  MQK#:[id number]   PCP: Elsie Stain, MD SU: Natalie Bookbinder, MD RAD ONC: Arloa Koh, MD  CHIEF COMPLAINT: left breast cancer CURRENT TREATMENT: letrozole 2.85m daily  BREAST CANCER HISTORY: From Dr. PCollier SalinaRubin's Natalie patient evaluation note dated 08/18/2011: "This is a Natalie 73year old [place] referred for evaluation of a breast mass. This woman and has not had a mammogram since approximately 2009. She noted a mass in the medial portion of the left breast about a year ago so. SSharol Givensought medical attention for this and was referred for a mammogram. Bilateral mammogram left breast ultrasound performed 08/09/2011 showed a firm at 71 cm mass the parasternal left breast 1:00 position with retraction of the overlying skin. No other abnormalities were seen. Ultrasound showed a hypoechoic irregular mass with margins. This area was biopsied on the same day and showed a invasive ductal cancer ER +100% PR +17% or less for the next 20% HER-2 was nonamplified. This is felt to be grade 2. An MRI scan of both breasts performed on 08/13/2011 showed an irregular enhancing mass with spiculated margins in the lower quadrant of the left breast measuring 1.5 x 1.7 x 1.9 cm this was abutting the pectoralis muscle enhancement was seen extending along the pectoralis muscle. "  INTERVAL HISTORY: CLatarshiareturns today for follow up of her breast cancer. She has been on letrozole since March 2013. She believes she has tolerated this well, with no complaints that she can think of. She denies hot flashes and vaginal changes. She is a recent widow, with her husband passing in 05/2012, but the interval history is also significant for the suicide of her grandson. She is still grieving for both of these losses.  REVIEW OF SYSTEMS: [name] denies pain, fevers, chills, nausea, vomiting, or changes in bowel or bladder habits. She has no shortness of breath, chest pain, cough, or fatigue. A detailed review of symptoms is otherwise negative.  PAST MEDICAL HISTORY: Past Medical History  Diagnosis Date  . Insomnia, unspecified   . Hypertension   . Hyperlipidemia   . GERD (gastroesophageal reflux disease)   . Anxiety   . Other abnormal glucose   . Osteoporosis, unspecified     with prev fosamax treatment  . Anemia   . Blood transfusion   . Diabetes mellitus 2011    type II, diet controlled  . Cancer 08/09/11 DX     LEFT  . Breast cancer 03/01/12    left breast lumpectomy=invasive ductal ca,grade I/III,INVILVES SKELATAL MUSCLE   er=+,pr=NEG. HER2 NEG.  . History of radiation therapy     RIGHT JAW 5 TXS FOR ABSESSED TOOTH IN HER 30'S   . Arthritis     osteoporosis  . Blood transfusion without reported diagnosis 1983    prbc  . Depression   . History of radiation therapy 04/10/12-05/26/12    left breast  . Diverticulosis     PAST SURGICAL HISTORY: Past Surgical History  Procedure Laterality Date  . Bilateral oophorectomy  1987  . Tubal ligation  1975  . Orif femur fracture Right     titanium rod  . Inguinal hernia repair Left 1999    LIH  . Sigmoidoscopy    . Breast lumpectomy  03/01/12    LEFT=INVASIVE DUCTAL CA,gRADE i/iii,INVASION AT PSTERIOR RESECTION MARGIN AND INVLOVES SKE;ATAL MUSCLE,PERINEURAL INVASION  IDENTIFIED  . Abdominal hysterectomy  1983    b/ls alpingooph orectomy 4 years later    FAMILY HISTORY Family History  Problem Relation Age of Onset  . Diabetes Mother   . Alcohol abuse Father   . Diabetes Sister   . Bladder Cancer Brother   . Diabetes Sister   . Bladder Cancer Son   . Colon cancer Neg Hx   . Esophageal cancer Neg Hx   . Pancreatic cancer Neg Hx   . Rectal cancer Neg Hx   . Stomach cancer Neg Hx     GYNECOLOGIC HISTORY: Menarche at age 19, Menopause at age  44,  no history of  hormone replacement therapy.  SOCIAL HISTORY: The patient is a recent widow was married to her late husband for 21 years.  That was her second marriage.  Her husband died unexpectedly after dental surgery in 05/2012.  The patient has 4 adult children, 2 girls and 2 boys.  She also has 4 grandchildren.  [name] recently relocated to [facility] and resides with her daughter [name].  The patient is a retired Warden/ranger.  She enjoys doing crafts, sewing, working on the computer, and playing with her [place].    ADVANCED DIRECTIVES: Not on file  HEALTH MAINTENANCE: History  Substance Use Topics  . Smoking status: Never Smoker   . Smokeless tobacco: Never Used  . Alcohol Use: No    Colonoscopy: Not on file PAP: Not on file Bone density: The patient's last bone density scan on 08/25/2011 showed a T score of -4.9 (osteoporosis). Lipid panel: 06/17/2011   Allergies  Allergen Reactions  . Bisphosphonates     Unclear relation to meds, but pt had long bone fracture after treatment with fosamax  . Diclofenac Potassium     Not sure  . Ezetimibe-Simvastatin     myalgias  . Naproxen Sodium     Not sure  . Nsaids   . Simvastatin     myalgias  . Sulfonamide Derivatives   . Vicodin [Hydrocodone-Acetaminophen] Nausea Only    Current Outpatient Prescriptions  Medication Sig Dispense Refill  . ACCU-CHEK SOFTCLIX LANCETS lancets USE ONCE DAILY AS DIRECTED  100 each  3  . amLODipine (NORVASC) 5 MG tablet TAKE 1 TABLET (5 MG TOTAL) BY MOUTH DAILY.  90 tablet  3  . CALCIUM CITRATE PO Take 500 mg by mouth once.      . cholecalciferol (VITAMIN D) 1000 UNITS tablet Take 1,000 Units by mouth daily.      . cyclobenzaprine (FLEXERIL) 10 MG tablet TAKE ONE-HALF TO ONE TABLET BY MOUTH THREE TIMES DAILY AS NEEDED FOR MUSCLE SPASM (SEDATION CAUTION)  90 tablet  3  . Fish Oil-Cholecalciferol (FISH OIL + D3) 1000-1000 MG-UNIT CAPS Take by mouth.      Marland Kitchen glucose  blood (ACCU-CHEK SMARTVIEW) test strip Check blood sugar once daily or as directed. Dx 250.00.  [name]/o fluctuating blood glucose level  100 each  12  . letrozole (FEMARA) 2.5 MG tablet Take 1 tablet (2.5 mg total) by mouth daily.  90 tablet  4  . LORazepam (ATIVAN) 2 MG tablet TAKE ONE-HALF TABLET BY MOUTH 2 TO 3 TIMES DAILY AS NEEDED FOR ANXIETY AND ONE TABLET AT BEDTIME AS NEEDED FOR SLEEP  75 tablet  3  . metFORMIN (GLUCOPHAGE) 500 MG tablet 500 mg 2 (two) times daily with a meal. Take up to 4 tabs a day, start with 1 a day for 4  days, increase by 1 pill as tolerated every 4 days.      . metoprolol (LOPRESSOR) 50 MG tablet TAKE ONE TABLET BY MOUTH TWICE DAILY  180 tablet  3  . Omega-3 Fatty Acids (FISH OIL) 1000 MG CAPS Take 1 capsule by mouth 2 (two) times daily.       Marland Kitchen omeprazole (PRILOSEC) 20 MG capsule TAKE ONE CAPSULE BY MOUTH ONCE DAILY  90 capsule  3  . sertraline (ZOLOFT) 100 MG tablet TAKE ONE TABLET BY MOUTH ONCE DAILY  90 tablet  1   No current facility-administered medications for this visit.    OBJECTIVE: Filed Vitals:   02/14/14 1423  BP: 166/78  Pulse: 53  Temp: 98.3 F (36.8 C)  Resp: 18     Body mass index is 27.01 kg/(m^2).      ECOG FS: 0  Skin: warm, dry  HEENT: sclerae anicteric, conjunctivae pink, oropharynx clear. No thrush or mucositis.  Lymph Nodes: No cervical or supraclavicular lymphadenopathy  Lungs: clear to auscultation bilaterally, no rales, wheezes, or rhonci  Heart: regular rate and rhythm  Abdomen: round, soft, non tender, positive bowel sounds  Musculoskeletal: Moderate kyphosis, No focal spinal tenderness, no peripheral edema  Neuro: non focal, well oriented, positive affect  Breasts: left breast status post lumpectomy and radiation, no evidence of recurrent disease, left axilla benign. Right breast unremarkable   LAB RESULTS: Lab Results  Component Value Date   WBC 7.0 02/14/2014   NEUTROABS 5.1 02/14/2014   HGB 13.6 02/14/2014   HCT 40.8  02/14/2014   MCV 90.2 02/14/2014   PLT 217 02/14/2014      Chemistry      Component Value Date/Time   NA 138 02/14/2014 1354   NA 135 10/05/2013 1527   K 3.8 02/14/2014 1354   K 3.8 10/05/2013 1527   CL 99 10/05/2013 1527   CL 100 03/22/2012 1200   CO2 26 02/14/2014 1354   CO2 26 10/05/2013 1527   BUN 8.9 02/14/2014 1354   BUN 11 10/05/2013 1527   CREATININE 0.7 02/14/2014 1354   CREATININE 0.52 10/05/2013 1527   CREATININE 0.6 12/13/2012 1637      Component Value Date/Time   CALCIUM 9.7 02/14/2014 1354   CALCIUM 9.8 10/05/2013 1527   ALKPHOS 60 02/14/2014 1354   ALKPHOS 53 10/05/2013 1527   AST 46* 02/14/2014 1354   AST 34 10/05/2013 1527   ALT 45 02/14/2014 1354   ALT 36* 10/05/2013 1527   BILITOT 0.25 02/14/2014 1354   BILITOT 0.5 10/05/2013 1527      Lab Results  Component Value Date   LABCA2 24 12/31/2011    Urinalysis    Component Value Date/Time   COLORURINE YELLOW 10/17/2010 1330   APPEARANCEUR CLEAR 10/17/2010 1330   LABSPEC 1.005 03/22/2012 1304   LABSPEC 1.013 10/17/2010 1330   PHURINE 6.0 10/17/2010 1330   GLUCOSEU NEGATIVE 10/17/2010 1330   HGBUR TRACE* 10/17/2010 1330   BILIRUBINUR NEGATIVE 10/17/2010 1330   KETONESUR NEGATIVE 10/17/2010 1330   PROTEINUR NEGATIVE 10/17/2010 1330   UROBILINOGEN 0.2 10/17/2010 1330   NITRITE NEGATIVE 10/17/2010 1330   LEUKOCYTESUR NEGATIVE 10/17/2010 1330    STUDIES: Most recent mammogram on 12/22/13 unremarkable.  Most bone density scan on 12/25/13 showed a t-score of -4.2 (moderate osteoporosis)   ASSESSMENT: 73 y.o. [name], [name] woman: 1.  Status post left breast needle core biopsy on 08/09/2011 which showed invasive mammary carcinoma at the 9 o'clock position, estrogen receptor positive 100%, progesterone  receptor positive 17%, Ki-67 20%, HER-2/neu negative by CISH with a ratio of 1.30.  2.  Neoadjuvant Femara started on 08/16/2011.  3.  Status post lateral breast MRI on 08/13/2011 which showed foci of nonspecific enhancement was seen bilaterally. An   irregular, enhancing mass with spiculated margins was seen in the lower inner quadrant of the left breast, posteriorly measuring 1.5 x 1.7 x 1.9 cm. The mass abutted the pectoralis muscle and enhancement was seen extending medially along the pectoralis muscle. Enhancement was also seen extending towards the skin where there was overlying retraction and thickening. A biopsy clip was seen in association with the mass. No other suspicious mass or enhancement was seen in either breast. No internal mammary or axillary adenopathy was present (clinical stage I, T1 N0)  4.  Status post left breast lumpectomy with sentinel node biopsy on 03/01/2012 for a stage I, ypT1c ypN0, 1.2 cm, invasive ductal carcinoma with perineural invasion, grade 1, estrogen receptor positive 99%, progesterone receptor negative 0%, Ki-67 20%, HER-2/neu negative by CISH with a ratio of 1.11, with 0/1 metastatic left axillary lymph nodes.  5.  The patient had radiation therapy from 04/10/2012 through 05/26/2012.  6.  The patient has been on antiestrogen therapy with Femara since 08/2011.  7.  Continued grief over the recent loss of her husband and now grandson  67.  Osteoporosis  PLAN: [name] is doing well on letrozole with no side effects that she can appreciate. The labs were reviewed in detail with her and were stable. The plan is to continue the letrozole for a total of 5 years.   [name] most recent bone density showed moderate osteoporosis (t-score -4.2) but slightly better than the previous scan in 2013 (t-score -4.9). She has been on Vitamin D and calcium supplements, but no bisphosphonates since 2012. She was on alendronate up until a right femur fracture and she now has a titanium rod in place in that area. She is unsure of why she stopped the alendronate, but thinks it may have been her choice. I discussed possibly starting back up on alendronate or possibly switching to zolendronate. She knows the impact they have on  bone health, but states that she would like time to think on this and will discuss with her daughter. I asked that she call back within the upcoming days with her decision.   [name] will return next year for labs and an office visit. She understands and agrees with the plan. She knows the goal of treatment in her case is cure. She has been encouraged to call with any issues that might arise before her next visit here.   Marcelino Duster, NP  02/14/2014, 3:53 PM

## 2014-02-15 LAB — VITAMIN D 25 HYDROXY (VIT D DEFICIENCY, FRACTURES): Vit D, 25-Hydroxy: 36 ng/mL (ref 30–89)

## 2014-03-03 ENCOUNTER — Encounter: Payer: Self-pay | Admitting: Family Medicine

## 2014-03-04 ENCOUNTER — Other Ambulatory Visit: Payer: Self-pay | Admitting: Family Medicine

## 2014-03-04 MED ORDER — LORAZEPAM 2 MG PO TABS: ORAL_TABLET | ORAL | Status: DC

## 2014-03-04 NOTE — Progress Notes (Signed)
Please call in.  See mychart message if needed.

## 2014-03-04 NOTE — Progress Notes (Signed)
Rx called to pharmacy as instructed. 

## 2014-03-12 ENCOUNTER — Other Ambulatory Visit: Payer: Self-pay | Admitting: Family Medicine

## 2014-03-12 NOTE — Telephone Encounter (Signed)
Received refill request electronically from pharmacy. Last refill 09/05/13 #90/1, last office visit 09/13/13. Is it okay to refill medication?

## 2014-03-13 NOTE — Telephone Encounter (Signed)
Sent. Thanks.   

## 2014-03-18 LAB — HM DIABETES EYE EXAM

## 2014-04-08 ENCOUNTER — Ambulatory Visit (INDEPENDENT_AMBULATORY_CARE_PROVIDER_SITE_OTHER): Payer: Medicare Other | Admitting: Family Medicine

## 2014-04-08 ENCOUNTER — Ambulatory Visit: Payer: Medicare Other | Admitting: Family Medicine

## 2014-04-08 ENCOUNTER — Encounter: Payer: Self-pay | Admitting: Family Medicine

## 2014-04-08 VITALS — BP 150/70 | HR 60 | Temp 98.4°F | Wt 143.0 lb

## 2014-04-08 DIAGNOSIS — IMO0002 Reserved for concepts with insufficient information to code with codable children: Secondary | ICD-10-CM

## 2014-04-08 DIAGNOSIS — E1165 Type 2 diabetes mellitus with hyperglycemia: Secondary | ICD-10-CM

## 2014-04-08 DIAGNOSIS — F411 Generalized anxiety disorder: Secondary | ICD-10-CM

## 2014-04-08 DIAGNOSIS — E119 Type 2 diabetes mellitus without complications: Secondary | ICD-10-CM

## 2014-04-08 DIAGNOSIS — M25519 Pain in unspecified shoulder: Secondary | ICD-10-CM

## 2014-04-08 LAB — LIPID PANEL
CHOL/HDL RATIO: 7
Cholesterol: 186 mg/dL (ref 0–200)
HDL: 26.7 mg/dL — AB (ref >39.00)
NONHDL: 159.3
Triglycerides: 214 mg/dL — ABNORMAL HIGH (ref 0.0–149.0)
VLDL: 42.8 mg/dL — AB (ref 0.0–40.0)

## 2014-04-08 LAB — LDL CHOLESTEROL, DIRECT: LDL DIRECT: 120.6 mg/dL

## 2014-04-08 LAB — HEMOGLOBIN A1C: Hgb A1c MFr Bld: 7.9 % — ABNORMAL HIGH (ref 4.6–6.5)

## 2014-04-08 MED ORDER — METHOCARBAMOL 500 MG PO TABS: 500.0000 mg | ORAL_TABLET | Freq: Four times a day (QID) | ORAL | Status: DC

## 2014-04-08 NOTE — Patient Instructions (Addendum)
Stop the metformin.  Go to the lab on the way out.  We'll contact you with your lab report.  We'll go from there. Take care. Glad to see you.

## 2014-04-08 NOTE — Progress Notes (Signed)
Pre visit review using our clinic review tool, if applicable. No additional management support is needed unless otherwise documented below in the visit note.  Diabetes:  Using medications without difficulties: possible GI upset on metformin.   Hypoglycemic episodes:no Hyperglycemic episodes:no Feet problems:no eye exam within last year: 03/28/14 See notes on labs.   L shoulder pain.  Not improved with flexeril, was sedated with med.  Robaxin did help.  Chronic issue for patient, pain with ROM.  Discussed options.    Mood changes.  H/o MDD, grandson committed suicide and this has been tough on patient.  No SI/HI.  Compliant with meds.   We talked about options.   PMH and SH reviewed  Meds, vitals, and allergies reviewed.   ROS: See HPI.  Otherwise negative.    GEN: nad, alert and oriented HEENT: mucous membranes moist NECK: supple w/o LA CV: rrr. PULM: ctab, no inc wob ABD: soft, +bs EXT: no edema SKIN: no acute rash  Diabetic foot exam: Normal inspection No skin breakdown No calluses  Normal DP pulses Normal sensation to light touch and monofilament Nails normal

## 2014-04-10 ENCOUNTER — Encounter: Payer: Self-pay | Admitting: Family Medicine

## 2014-04-10 MED ORDER — BUPROPION HCL ER (SR) 100 MG PO TB12: 100.0000 mg | ORAL_TABLET | Freq: Every day | ORAL | Status: DC

## 2014-04-10 NOTE — Assessment & Plan Note (Signed)
See notes on labs. Benign abd exam, likely with ADE/GI upset on metformin. Stop for now, she'll update me in the meantime.

## 2014-04-10 NOTE — Assessment & Plan Note (Signed)
She can try robaxin instead of flexeril, d/w pt.

## 2014-04-10 NOTE — Assessment & Plan Note (Signed)
And depressive sx. Okay for outpatient fu.  Would be reasonable to try wellbutrin in addition to her current meds.  Will send rx.  Will get her to update [place].

## 2014-04-12 ENCOUNTER — Encounter: Payer: Self-pay | Admitting: Family Medicine

## 2014-04-21 ENCOUNTER — Other Ambulatory Visit: Payer: Self-pay | Admitting: Family Medicine

## 2014-04-22 ENCOUNTER — Other Ambulatory Visit: Payer: Self-pay | Admitting: Emergency Medicine

## 2014-04-22 ENCOUNTER — Encounter: Payer: Self-pay | Admitting: Family Medicine

## 2014-04-22 DIAGNOSIS — Z853 Personal history of malignant neoplasm of breast: Secondary | ICD-10-CM

## 2014-04-22 MED ORDER — LETROZOLE 2.5 MG PO TABS: 2.5000 mg | ORAL_TABLET | Freq: Every day | ORAL | Status: DC

## 2014-04-22 NOTE — Telephone Encounter (Signed)
Received refill request electronically from pharmacy. Last refill 03/04/14 #225. Is it okay to refill medication?

## 2014-04-23 ENCOUNTER — Telehealth: Payer: Self-pay

## 2014-04-23 ENCOUNTER — Encounter: Payer: Self-pay | Admitting: Family Medicine

## 2014-04-23 ENCOUNTER — Other Ambulatory Visit: Payer: Self-pay | Admitting: Family Medicine

## 2014-04-23 MED ORDER — SITAGLIPTIN PHOSPHATE 100 MG PO TABS: 50.0000 mg | ORAL_TABLET | Freq: Every day | ORAL | Status: DC

## 2014-04-23 NOTE — Telephone Encounter (Signed)
Pt notified as instructed and pt voiced understanding. 

## 2014-04-23 NOTE — Telephone Encounter (Signed)
Pt request lorazepam called to [facility]. Advised pt will call to pharmacy now.Medication phoned to [facility] as instructed.

## 2014-04-23 NOTE — Telephone Encounter (Signed)
Pt said Januvia was $167.00 and pt did not pick up due to expense. Pt request med that is a tier 1. Advised pt to call ins co and ask what med ins will approve on tier 1 that is similar to Januvia. Pt will contact ins co and call back with information.

## 2014-04-23 NOTE — Telephone Encounter (Signed)
Please call in.  Thanks.   

## 2014-04-23 NOTE — Telephone Encounter (Signed)
I just sent her a mychart message.  I cut and pasted it below.  Thanks.   We'll call in the lorazepam.   I would continue the bupropion for now.  Since your sugars are up, I would try taking a half tab of januvia daily. The tabs are 100mg  each, but you can cut them to make a 50mg  dose. That may help with the cost. I sent it to the pharmacy.  I would stay off the metformin for now.  We should recheck your A1c at/before a visit in late January 2016, but update me as needed about your sugars.   Thanks for the update.  Take care.

## 2014-04-23 NOTE — Telephone Encounter (Signed)
Thanks

## 2014-04-23 NOTE — Telephone Encounter (Signed)
Dr. Damita Dunnings, Here is some of my blood sugar readings:  11-03 Tues. eve 165  11-04 wed morn 189  wed eve--161  11-05 thur morn 190---midday 129  11-06 fri morn 212 ---midday 148  11-07 sat morn 214 ---sat bedtime--221  11-08 sun morn--200  sun--midday 144  11-09 mon--234  I am not nauseous, but am a little shakey, I called [facility] to see if I can get my lorazepam refilled. They said they would have to call you .I have been taking 2 at night & 2 or 3 during the day. I guess the bupropion is doing ok. I can't tell much difference. I am not taking any of the metformin. when will I need to get my labs checked again? Thank you for your help & kindness.  [name]   Pt called requesting cb about email (listed above) pt sent previously. Lorazepam had been approved by Dr Damita Dunnings and that was called to [facility]. Pt request cb about BS and when to get labs recked and what diabetic med to take.

## 2014-04-24 ENCOUNTER — Telehealth: Payer: Self-pay | Admitting: Family Medicine

## 2014-04-24 MED ORDER — LORAZEPAM 2 MG PO TABS: 2.0000 mg | ORAL_TABLET | Freq: Three times a day (TID) | ORAL | Status: DC | PRN

## 2014-04-24 MED ORDER — PIOGLITAZONE HCL 15 MG PO TABS: 15.0000 mg | ORAL_TABLET | Freq: Every day | ORAL | Status: DC

## 2014-04-24 NOTE — Telephone Encounter (Signed)
Pt said ins co said there was no equivalent med to [facility]; pt wants to know if could prescribe similar med  Because pt cannot afford Januvia. Pharmacy also told pt that it is too early to get lorazepam; pt cannot pick up until Dec. Pt said due to her grandson's death pt has been taking Lorazepam 2 mg taking 1 tab instead of 1/2 tab during the day and taking 2 tabs at hs. Pt still wakes up 2 - 3 times during the night. Pt wants to know if can take different dosage or different med so she can get either lorazepam or similar med. [facility].Please advise. Pt request cb.

## 2014-04-24 NOTE — Telephone Encounter (Signed)
Pharmacist Gwenette Greet) notified as instructed by telephone and script was given. Patient notified as instructed by telephone. Patient stated that she will call back next week with sugar readings.

## 2014-04-24 NOTE — Telephone Encounter (Signed)
Please call pharmacy.  Please explain about her grandson's death and the change in dose, 2mg  per dose, up to 3 tabs a day.  At that rate with the inc in dose, she should run out early.  Please ask pharmacy to take new sig, 2mg  up to 3tabs a day, #90, no rf.  Thanks.  actos sent for DM.  See if the can tolerate that and notify SX:[id number] next week. Thanks.

## 2014-04-24 NOTE — Addendum Note (Signed)
Addended by: Tonia Ghent on: 04/24/2014 01:24 PM   Modules accepted: Orders, Medications

## 2014-04-24 NOTE — Telephone Encounter (Signed)
See phone note

## 2014-04-30 ENCOUNTER — Other Ambulatory Visit: Payer: Self-pay | Admitting: Family Medicine

## 2014-05-13 ENCOUNTER — Other Ambulatory Visit: Payer: Self-pay | Admitting: Family Medicine

## 2014-05-16 ENCOUNTER — Other Ambulatory Visit: Payer: Self-pay | Admitting: Family Medicine

## 2014-05-16 NOTE — Telephone Encounter (Signed)
Electronic refill request. Last Filled:    90 tablet 0 RF on 04/24/2014  Please advise.

## 2014-05-19 NOTE — Telephone Encounter (Signed)
Please call in.  Thanks.   

## 2014-05-20 NOTE — Telephone Encounter (Signed)
Rx called to pharmacy as instructed. 

## 2014-07-15 ENCOUNTER — Encounter: Payer: Self-pay | Admitting: Family Medicine

## 2014-07-16 ENCOUNTER — Other Ambulatory Visit: Payer: Self-pay | Admitting: Family Medicine

## 2014-07-16 MED ORDER — LORAZEPAM 2 MG PO TABS: ORAL_TABLET | ORAL | Status: DC

## 2014-07-16 NOTE — Progress Notes (Signed)
Medication phoned to pharmacy.  

## 2014-07-16 NOTE — Progress Notes (Signed)
Please call in ativan.  See mychart message.  Thanks.

## 2014-09-14 ENCOUNTER — Other Ambulatory Visit: Payer: Self-pay | Admitting: Family Medicine

## 2014-09-17 ENCOUNTER — Other Ambulatory Visit: Payer: Self-pay | Admitting: *Deleted

## 2014-09-18 ENCOUNTER — Ambulatory Visit (INDEPENDENT_AMBULATORY_CARE_PROVIDER_SITE_OTHER): Payer: Medicare Other | Admitting: Family Medicine

## 2014-09-18 ENCOUNTER — Ambulatory Visit (INDEPENDENT_AMBULATORY_CARE_PROVIDER_SITE_OTHER)
Admission: RE | Admit: 2014-09-18 | Discharge: 2014-09-18 | Disposition: A | Discharge status: Home / Self Care | Payer: Medicare Other | Source: Ambulatory Visit | Attending: Family Medicine | Admitting: Family Medicine

## 2014-09-18 ENCOUNTER — Other Ambulatory Visit: Payer: Medicare Other

## 2014-09-18 ENCOUNTER — Encounter: Payer: Self-pay | Admitting: Family Medicine

## 2014-09-18 VITALS — BP 140/70 | HR 60 | Temp 98.5°F | Wt 145.8 lb

## 2014-09-18 DIAGNOSIS — F411 Generalized anxiety disorder: Secondary | ICD-10-CM

## 2014-09-18 DIAGNOSIS — E1165 Type 2 diabetes mellitus with hyperglycemia: Secondary | ICD-10-CM

## 2014-09-18 DIAGNOSIS — IMO0002 Reserved for concepts with insufficient information to code with codable children: Secondary | ICD-10-CM

## 2014-09-18 DIAGNOSIS — R0789 Other chest pain: Secondary | ICD-10-CM | POA: Diagnosis not present

## 2014-09-18 DIAGNOSIS — E119 Type 2 diabetes mellitus without complications: Secondary | ICD-10-CM | POA: Diagnosis not present

## 2014-09-18 NOTE — Assessment & Plan Note (Addendum)
Some better than prev, still okay for outpatient f/u.  Would continue as is.  I hope that as time passes she will have more relief.  No change in meds at this point.  She agrees.  Not sedated on BZD.

## 2014-09-18 NOTE — Progress Notes (Signed)
Pre visit review using our clinic review tool, if applicable. No additional management support is needed unless otherwise documented below in the visit note.  She is still trying to work through her grandson's death.  We talked about it.  She thinks the wellbutrin helped some, along with the zoloft.  She still has some depressed mood, episodically, but this is manageable.  No SI/HI.  She was able to cut back some on the ativan.    DM2.  On actos.  She was asking about a retrial of metformin, to get off actos. We agreed to check a1c today and go from there.    She dropped a towel at the hair salon, leaned over the arm of the chair, felt/heard a pop, and had L sided chest pain.  Still sore in the meantime.  It felt puffy initially.  Pain with movement, with a deep breath, rolling over in bed.  She had been taking ibuprofen for pain, some relief.  She has only taking 4 OTC ibuprofen in a day total, none today.    Meds, vitals, and allergies reviewed.   ROS: See HPI.  Otherwise, noncontributory.  nad ncat Neck supple. No LA rrr Ctab, L chest wall ttp abd soft, not ttp Ext w/o edema

## 2014-09-18 NOTE — Patient Instructions (Signed)
Take up to 3 ibuprofen at a time, twice a day if needed, with food.  Take it for the shortest period of time possible.  Go to the lab on the way out.  We'll contact you with your lab and xray report. We'll see about changing the actos.   Take care.  Glad to see you.

## 2014-09-18 NOTE — Assessment & Plan Note (Signed)
Tender but no inc in WOB.  Would use higher dose of ibuprofen in the meantime, short term, she has been able to tolerate that.  Routine NSAIDS caution given.  She agrees.  CXR pending.  >25 minutes spent in face to face time with patient, >50% spent in counselling or coordination of care.

## 2014-09-18 NOTE — Assessment & Plan Note (Signed)
Recheck A1c and MALB today.  We may be able to try metformin instead of actos, at a low dose.  See notes on labs.

## 2014-09-19 ENCOUNTER — Other Ambulatory Visit: Payer: Self-pay | Admitting: Family Medicine

## 2014-09-19 LAB — MICROALBUMIN / CREATININE URINE RATIO
Creatinine,U: 146.5 mg/dL
Microalb Creat Ratio: 0.8 mg/g (ref 0.0–30.0)
Microalb, Ur: 1.2 mg/dL (ref 0.0–1.9)

## 2014-09-19 LAB — HEMOGLOBIN A1C: HEMOGLOBIN A1C: 7.7 % — AB (ref 4.6–6.5)

## 2014-09-19 MED ORDER — METFORMIN HCL 500 MG PO TABS: 500.0000 mg | ORAL_TABLET | Freq: Two times a day (BID) | ORAL | Status: DC

## 2014-09-20 ENCOUNTER — Other Ambulatory Visit: Payer: Self-pay | Admitting: Family Medicine

## 2014-09-20 DIAGNOSIS — E1165 Type 2 diabetes mellitus with hyperglycemia: Secondary | ICD-10-CM

## 2014-09-20 DIAGNOSIS — M810 Age-related osteoporosis without current pathological fracture: Secondary | ICD-10-CM

## 2014-09-20 DIAGNOSIS — D509 Iron deficiency anemia, unspecified: Secondary | ICD-10-CM

## 2014-09-20 DIAGNOSIS — IMO0002 Reserved for concepts with insufficient information to code with codable children: Secondary | ICD-10-CM

## 2014-09-26 ENCOUNTER — Telehealth: Payer: Self-pay

## 2014-09-26 DIAGNOSIS — R0789 Other chest pain: Secondary | ICD-10-CM

## 2014-09-26 NOTE — Telephone Encounter (Addendum)
Pt left v/m; pt was seen on 09/18/14 with rib pain; pt continues with pain in same area as when seen.  Unable to contact pt for further info; called pt but no answer. Pt called back and taking ibuprofen 400 mg 4 x a day with no relief of pain and now has pain in lt side of back where ribs are. Pt said pain is worse when takes a deep breath. Pt cannot take temp;pt feels warm. [facility].  Pt said has had pain for 2 weeks and wonder if should have CT scan.Pt request cb.

## 2014-09-26 NOTE — Telephone Encounter (Signed)
Patient advised.

## 2014-09-26 NOTE — Telephone Encounter (Signed)
Reasonable to get the CT.  Ordered.  I would try 3 ibuprofen at a time, with food, twice a day in the meantime. Thanks.

## 2014-09-27 ENCOUNTER — Ambulatory Visit (INDEPENDENT_AMBULATORY_CARE_PROVIDER_SITE_OTHER)
Admission: RE | Admit: 2014-09-27 | Discharge: 2014-09-27 | Disposition: A | Discharge status: Home / Self Care | Payer: Medicare Other | Source: Ambulatory Visit | Attending: Family Medicine | Admitting: Family Medicine

## 2014-09-27 ENCOUNTER — Telehealth: Payer: Self-pay

## 2014-09-27 DIAGNOSIS — R0789 Other chest pain: Secondary | ICD-10-CM

## 2014-09-27 NOTE — Telephone Encounter (Signed)
Pt had CT of chest earlier today and request cb at [phone] when results are available; pt still having discomfort in ribs under lt breast.

## 2014-09-27 NOTE — Telephone Encounter (Signed)
plz notify results not back yet - will update on Monday when they arrive.

## 2014-09-28 NOTE — Telephone Encounter (Signed)
Will send results when available

## 2014-09-30 ENCOUNTER — Telehealth: Payer: Self-pay | Admitting: Family Medicine

## 2014-09-30 NOTE — Telephone Encounter (Signed)
Pt is requesting results from ct scan that was done on Friday. Please call at [phone]

## 2014-09-30 NOTE — Telephone Encounter (Signed)
See results note. 

## 2014-10-12 ENCOUNTER — Other Ambulatory Visit: Payer: Self-pay | Admitting: Family Medicine

## 2014-10-14 NOTE — Telephone Encounter (Signed)
Electronic refill request. Last Filled:    150 tablet 2 RF on 07/16/2014  Please advise.

## 2014-10-15 ENCOUNTER — Telehealth: Payer: Self-pay | Admitting: Family Medicine

## 2014-10-15 NOTE — Telephone Encounter (Signed)
Pt called in, requesting a prescription refill.  She didnot want to go through triage, and states that she needs the medication today as she is going out of town.  She is requesting a refill for lorazepam 2mg  #150 Best number to call pt back is the home number.  Thanks.

## 2014-10-15 NOTE — Telephone Encounter (Signed)
Medication phoned to pharmacy.  

## 2014-10-15 NOTE — Telephone Encounter (Signed)
Patient advised.

## 2014-10-15 NOTE — Telephone Encounter (Signed)
Please call in.  Thanks.   

## 2014-10-23 ENCOUNTER — Telehealth: Payer: Self-pay | Admitting: Family Medicine

## 2014-10-23 ENCOUNTER — Encounter: Payer: Self-pay | Admitting: Family Medicine

## 2014-10-23 NOTE — Telephone Encounter (Signed)
Letter mailed to patient as instructed. Patient aware that letter has been mailed to her.

## 2014-10-23 NOTE — Telephone Encounter (Signed)
Letter done for patient re: jury duty. Please mail to patient.  See mychart message.  Thanks.

## 2014-11-05 ENCOUNTER — Other Ambulatory Visit: Payer: Self-pay | Admitting: General Surgery

## 2014-11-05 ENCOUNTER — Other Ambulatory Visit: Payer: Self-pay

## 2014-11-05 DIAGNOSIS — C50912 Malignant neoplasm of unspecified site of left female breast: Secondary | ICD-10-CM | POA: Diagnosis not present

## 2014-11-05 DIAGNOSIS — N63 Unspecified lump in unspecified breast: Secondary | ICD-10-CM

## 2014-11-06 ENCOUNTER — Other Ambulatory Visit: Payer: Self-pay | Admitting: Family Medicine

## 2014-11-13 ENCOUNTER — Ambulatory Visit
Admission: RE | Admit: 2014-11-13 | Discharge: 2014-11-13 | Disposition: A | Discharge status: Home / Self Care | Payer: Medicare Other | Source: Ambulatory Visit | Attending: General Surgery | Admitting: General Surgery

## 2014-11-13 DIAGNOSIS — N6459 Other signs and symptoms in breast: Secondary | ICD-10-CM | POA: Diagnosis not present

## 2014-11-13 DIAGNOSIS — Z853 Personal history of malignant neoplasm of breast: Secondary | ICD-10-CM | POA: Diagnosis not present

## 2014-11-13 DIAGNOSIS — N63 Unspecified lump in unspecified breast: Secondary | ICD-10-CM

## 2014-11-28 ENCOUNTER — Other Ambulatory Visit: Payer: Self-pay | Admitting: Family Medicine

## 2014-11-28 ENCOUNTER — Other Ambulatory Visit: Payer: Self-pay | Admitting: General Surgery

## 2014-11-28 DIAGNOSIS — Z853 Personal history of malignant neoplasm of breast: Secondary | ICD-10-CM

## 2014-12-02 ENCOUNTER — Other Ambulatory Visit: Payer: Self-pay

## 2014-12-02 ENCOUNTER — Other Ambulatory Visit: Payer: Self-pay | Admitting: General Surgery

## 2014-12-02 DIAGNOSIS — Z853 Personal history of malignant neoplasm of breast: Secondary | ICD-10-CM

## 2014-12-12 ENCOUNTER — Other Ambulatory Visit: Payer: Self-pay | Admitting: Family Medicine

## 2014-12-31 ENCOUNTER — Ambulatory Visit
Admission: RE | Admit: 2014-12-31 | Discharge: 2014-12-31 | Disposition: A | Discharge status: Home / Self Care | Payer: Medicare Other | Source: Ambulatory Visit | Attending: General Surgery | Admitting: General Surgery

## 2014-12-31 DIAGNOSIS — Z853 Personal history of malignant neoplasm of breast: Secondary | ICD-10-CM | POA: Diagnosis not present

## 2014-12-31 DIAGNOSIS — R928 Other abnormal and inconclusive findings on diagnostic imaging of breast: Secondary | ICD-10-CM | POA: Diagnosis not present

## 2015-01-16 ENCOUNTER — Other Ambulatory Visit: Payer: Self-pay | Admitting: Family Medicine

## 2015-01-17 NOTE — Telephone Encounter (Signed)
Electronic refill request. Last Filled:    150 tablet 2 RF on 10/15/2014  Please advise.   

## 2015-01-19 NOTE — Telephone Encounter (Signed)
Please call in.  Thanks.   

## 2015-01-20 NOTE — Telephone Encounter (Signed)
Medication phoned to pharmacy.  

## 2015-01-21 ENCOUNTER — Encounter: Payer: Self-pay | Admitting: Family Medicine

## 2015-01-21 ENCOUNTER — Ambulatory Visit (INDEPENDENT_AMBULATORY_CARE_PROVIDER_SITE_OTHER): Payer: Medicare Other | Admitting: Family Medicine

## 2015-01-21 VITALS — BP 142/70 | HR 61 | Temp 97.7°F | Ht 61.0 in | Wt 148.5 lb

## 2015-01-21 DIAGNOSIS — D509 Iron deficiency anemia, unspecified: Secondary | ICD-10-CM | POA: Diagnosis not present

## 2015-01-21 DIAGNOSIS — Z7189 Other specified counseling: Secondary | ICD-10-CM

## 2015-01-21 DIAGNOSIS — F411 Generalized anxiety disorder: Secondary | ICD-10-CM

## 2015-01-21 DIAGNOSIS — M810 Age-related osteoporosis without current pathological fracture: Secondary | ICD-10-CM

## 2015-01-21 DIAGNOSIS — E1165 Type 2 diabetes mellitus with hyperglycemia: Secondary | ICD-10-CM

## 2015-01-21 DIAGNOSIS — Z23 Encounter for immunization: Secondary | ICD-10-CM

## 2015-01-21 DIAGNOSIS — K219 Gastro-esophageal reflux disease without esophagitis: Secondary | ICD-10-CM

## 2015-01-21 DIAGNOSIS — W19XXXA Unspecified fall, initial encounter: Secondary | ICD-10-CM

## 2015-01-21 DIAGNOSIS — Z0000 Encounter for general adult medical examination without abnormal findings: Secondary | ICD-10-CM

## 2015-01-21 DIAGNOSIS — IMO0002 Reserved for concepts with insufficient information to code with codable children: Secondary | ICD-10-CM

## 2015-01-21 LAB — COMPREHENSIVE METABOLIC PANEL
ALBUMIN: 4.3 g/dL (ref 3.5–5.2)
ALK PHOS: 66 U/L (ref 39–117)
ALT: 57 U/L — AB (ref 0–35)
AST: 65 U/L — ABNORMAL HIGH (ref 0–37)
BUN: 12 mg/dL (ref 6–23)
CALCIUM: 10.1 mg/dL (ref 8.4–10.5)
CHLORIDE: 96 meq/L (ref 96–112)
CO2: 30 mEq/L (ref 19–32)
Creatinine, Ser: 0.63 mg/dL (ref 0.40–1.20)
GFR: 97.67 mL/min (ref >60.00)
Glucose, Bld: 119 mg/dL — ABNORMAL HIGH (ref 70–99)
Potassium: 4 mEq/L (ref 3.5–5.1)
SODIUM: 132 meq/L — AB (ref 135–145)
Total Bilirubin: 0.5 mg/dL (ref 0.2–1.2)
Total Protein: 7.9 g/dL (ref 6.0–8.3)

## 2015-01-21 LAB — CBC WITH DIFFERENTIAL/PLATELET
Basophils Absolute: 0.1 10*3/uL (ref 0.0–0.1)
Basophils Relative: 0.7 % (ref 0.0–3.0)
EOS ABS: 0.3 10*3/uL (ref 0.0–0.7)
Eosinophils Relative: 3 % (ref 0.0–5.0)
HCT: 42.2 % (ref 36.0–46.0)
Hemoglobin: 14.3 g/dL (ref 12.0–15.0)
Lymphocytes Relative: 23.1 % (ref 12.0–46.0)
Lymphs Abs: 2 10*3/uL (ref 0.7–4.0)
MCHC: 33.8 g/dL (ref 30.0–36.0)
MCV: 89.4 fl (ref 78.0–100.0)
MONOS PCT: 6.5 % (ref 3.0–12.0)
Monocytes Absolute: 0.6 10*3/uL (ref 0.1–1.0)
Neutro Abs: 5.7 10*3/uL (ref 1.4–7.7)
Neutrophils Relative %: 66.7 % (ref 43.0–77.0)
Platelets: 245 10*3/uL (ref 150.0–400.0)
RBC: 4.72 Mil/uL (ref 3.87–5.11)
RDW: 13.3 % (ref 11.5–15.5)
WBC: 8.6 10*3/uL (ref 4.0–10.5)

## 2015-01-21 LAB — LIPID PANEL
Cholesterol: 191 mg/dL (ref 0–200)
HDL: 25.6 mg/dL — AB (ref >39.00)
NonHDL: 165.56
TRIGLYCERIDES: 270 mg/dL — AB (ref 0.0–149.0)
Total CHOL/HDL Ratio: 7
VLDL: 54 mg/dL — ABNORMAL HIGH (ref 0.0–40.0)

## 2015-01-21 LAB — LDL CHOLESTEROL, DIRECT: LDL DIRECT: 133 mg/dL

## 2015-01-21 LAB — VITAMIN D 25 HYDROXY (VIT D DEFICIENCY, FRACTURES): VITD: 43.1 ng/mL (ref 30.00–100.00)

## 2015-01-21 LAB — HEMOGLOBIN A1C: Hgb A1c MFr Bld: 8.1 % — ABNORMAL HIGH (ref 4.6–6.5)

## 2015-01-21 MED ORDER — SERTRALINE HCL 100 MG PO TABS: 150.0000 mg | ORAL_TABLET | Freq: Every day | ORAL | Status: DC

## 2015-01-21 NOTE — Progress Notes (Signed)
Pre visit review using our clinic review tool, if applicable. No additional management support is needed unless otherwise documented below in the visit note.  I have personally reviewed the Medicare Annual Wellness questionnaire and have noted 1. The patient's medical and social history 2. Their use of alcohol, tobacco or illicit drugs 3. Their current medications and supplements 4. The patient's functional ability including ADL's, fall risks, home safety risks and hearing or visual             impairment. 5. Diet and physical activities 6. Evidence for depression or mood disorders  The patients weight, height, BMI have been recorded in the chart and visual acuity is per eye clinic.  I have made referrals, counseling and provided education to the patient based review of the above and I have provided the pt with a written personalized care plan for preventive services.  Provider list updated- see scanned forms.  Routine anticipatory guidance given to patient.  See health maintenance.  Flu prev done Shingles deferred, since she was getting PNA vaccine today PNA updated 2016 Tetanus 2009 Colonoscopy 2015 Breast cancer screening per ONC clinic.  She has routine f/u pending.  Advance directive - daughter Otis Peak designated if patient were incapacitated.   Cognitive function addressed- see scanned forms- and if abnormal then additional documentation follows.   Depression.  Still dealing with death of husband and the upcoming anniversary of her grandson's suicide.  Mood is lower, more anxious, tearful, worried.  Still on baseline meds w/o ADE.  Her other grandson has been drinking more and that has been troubling.  She is still safe at home.   D/w pt about options.    [place] a few weeks ago.  Home repairs were ongoing and she she tripped on a drop cloth.  That has been corrected in the meantime.  No other falls.  Prev with R knee pain, now resolved.  Still able to bear weight.    DM2.  Due for  labs.  No ade on med.  Compliant.  Not checking sugar often.  No sx of lows or highs.    More GERD sx. Likely exacerbated by recent stressors and diet changes.  Still on daily  PPI, no ade on med, just continued sx.  D/w pt.    PMH and SH reviewed  Meds, vitals, and allergies reviewed.   ROS: See HPI.  Otherwise negative.    GEN: nad, alert and oriented, affect flatter than typical, tearful but regains composure.  HEENT: mucous membranes moist NECK: supple w/o LA CV: rrr. PULM: ctab, no inc wob ABD: soft, +bs EXT: no edema SKIN: no acute rash R knee with normal inspection, minimal crepitus on ROM, not ttp on joint line, no locking.   Diabetic foot exam: Normal inspection No skin breakdown No calluses  Normal DP pulses Normal sensation to light touch and monofilament Nails normal

## 2015-01-21 NOTE — Patient Instructions (Signed)
Pneumonia vaccine today.  Go to the lab on the way out.  We'll contact you with your lab report. Up the sertraline to 1.5 tabs a day.  I sent a new prescription to the pharmacy.  Let me know if that isn't helping.  Don't change your other meds for now.  We'll set your follow up when I see your labs.   Take care.  Glad to see.

## 2015-01-22 ENCOUNTER — Encounter: Payer: Self-pay | Admitting: Family Medicine

## 2015-01-22 ENCOUNTER — Encounter: Payer: Self-pay | Admitting: *Deleted

## 2015-01-22 DIAGNOSIS — W19XXXA Unspecified fall, initial encounter: Secondary | ICD-10-CM | POA: Insufficient documentation

## 2015-01-22 DIAGNOSIS — Z7189 Other specified counseling: Secondary | ICD-10-CM | POA: Insufficient documentation

## 2015-01-22 MED ORDER — OMEPRAZOLE 20 MG PO CPDR: 20.0000 mg | DELAYED_RELEASE_CAPSULE | Freq: Two times a day (BID) | ORAL | Status: DC

## 2015-01-22 NOTE — Assessment & Plan Note (Signed)
Flu prev done  Shingles deferred, since she was getting PNA vaccine today  PNA updated 2016  Tetanus 2009  Colonoscopy 2015  Breast cancer screening per ONC clinic. She has routine f/u pending.  Advance directive - daughter Otis Peak designated if patient were incapacitated.  Cognitive function addressed- see scanned forms- and if abnormal then additional documentation follows.

## 2015-01-22 NOTE — Assessment & Plan Note (Signed)
Can inc ppi to bid for now, d/w pt.  She'll update me as needed.

## 2015-01-22 NOTE — Assessment & Plan Note (Signed)
Isolated incident, d/w pt about fall precautions.  Knee exam unremarkable, age expected.  No pain now, f/u prn.

## 2015-01-22 NOTE — Assessment & Plan Note (Signed)
Exacerbated by grief and pending anniversary, will inc SSRI to 150mg  and she'll update me.  Still okay for outpatient f/u.

## 2015-01-22 NOTE — Assessment & Plan Note (Signed)
Labs done at [facility].  Still with normal foot exam, but A1c still up.  No change in meds.  Will work on mood first as this is the biggest issue currently.

## 2015-02-20 ENCOUNTER — Telehealth: Payer: Self-pay | Admitting: Oncology

## 2015-02-20 ENCOUNTER — Ambulatory Visit (HOSPITAL_BASED_OUTPATIENT_CLINIC_OR_DEPARTMENT_OTHER): Payer: Medicare Other | Admitting: Oncology

## 2015-02-20 ENCOUNTER — Other Ambulatory Visit (HOSPITAL_BASED_OUTPATIENT_CLINIC_OR_DEPARTMENT_OTHER): Payer: Medicare Other

## 2015-02-20 VITALS — BP 122/64 | HR 62 | Temp 98.3°F | Resp 18 | Ht 61.0 in | Wt 146.8 lb

## 2015-02-20 DIAGNOSIS — C50912 Malignant neoplasm of unspecified site of left female breast: Secondary | ICD-10-CM

## 2015-02-20 DIAGNOSIS — M810 Age-related osteoporosis without current pathological fracture: Secondary | ICD-10-CM

## 2015-02-20 DIAGNOSIS — Z170 Estrogen receptor positive status [ER+]: Secondary | ICD-10-CM

## 2015-02-20 DIAGNOSIS — C50812 Malignant neoplasm of overlapping sites of left female breast: Secondary | ICD-10-CM | POA: Diagnosis not present

## 2015-02-20 DIAGNOSIS — Z79811 Long term (current) use of aromatase inhibitors: Secondary | ICD-10-CM | POA: Diagnosis not present

## 2015-02-20 DIAGNOSIS — Z853 Personal history of malignant neoplasm of breast: Secondary | ICD-10-CM

## 2015-02-20 LAB — CBC WITH DIFFERENTIAL/PLATELET
BASO%: 1.3 % (ref 0.0–2.0)
Basophils Absolute: 0.1 10*3/uL (ref 0.0–0.1)
EOS%: 1.7 % (ref 0.0–7.0)
Eosinophils Absolute: 0.2 10*3/uL (ref 0.0–0.5)
HEMATOCRIT: 40.7 % (ref 34.8–46.6)
HGB: 13.9 g/dL (ref 11.6–15.9)
LYMPH#: 1.8 10*3/uL (ref 0.9–3.3)
LYMPH%: 17 % (ref 14.0–49.7)
MCH: 30.1 pg (ref 25.1–34.0)
MCHC: 34.1 g/dL (ref 31.5–36.0)
MCV: 88.4 fL (ref 79.5–101.0)
MONO#: 0.7 10*3/uL (ref 0.1–0.9)
MONO%: 6.7 % (ref 0.0–14.0)
NEUT%: 73.3 % (ref 38.4–76.8)
NEUTROS ABS: 7.6 10*3/uL — AB (ref 1.5–6.5)
PLATELETS: 212 10*3/uL (ref 145–400)
RBC: 4.61 10*6/uL (ref 3.70–5.45)
RDW: 13.2 % (ref 11.2–14.5)
WBC: 10.4 10*3/uL — AB (ref 3.9–10.3)

## 2015-02-20 LAB — COMPREHENSIVE METABOLIC PANEL (CC13)
ALT: 52 U/L (ref 0–55)
ANION GAP: 9 meq/L (ref 3–11)
AST: 44 U/L — AB (ref 5–34)
Albumin: 3.8 g/dL (ref 3.5–5.0)
Alkaline Phosphatase: 82 U/L (ref 40–150)
BILIRUBIN TOTAL: 0.89 mg/dL (ref 0.20–1.20)
BUN: 9 mg/dL (ref 7.0–26.0)
CO2: 25 meq/L (ref 22–29)
Calcium: 9.8 mg/dL (ref 8.4–10.4)
Chloride: 101 mEq/L (ref 98–109)
Creatinine: 0.8 mg/dL (ref 0.6–1.1)
EGFR: 73 mL/min/{1.73_m2} — ABNORMAL LOW (ref >90)
Glucose: 157 mg/dl — ABNORMAL HIGH (ref 70–140)
Potassium: 4.1 mEq/L (ref 3.5–5.1)
Sodium: 135 mEq/L — ABNORMAL LOW (ref 136–145)
TOTAL PROTEIN: 7.6 g/dL (ref 6.4–8.3)

## 2015-02-20 MED ORDER — LETROZOLE 2.5 MG PO TABS: 2.5000 mg | ORAL_TABLET | Freq: Every day | ORAL | Status: DC

## 2015-02-20 NOTE — Progress Notes (Signed)
[facility]  Telephone:([phone]) [phone] Fax:(336) [phone]  OFFICE PROGRESS NOTE   ID: [name]   DOB: 12-06-1940  MR#: [id number]  QVZ#:[id number]   PCP: Natalie Stain, MD SU: Rolm Bookbinder, MD RAD ONC: Arloa Koh, MD  CHIEF COMPLAINT: left breast cancer  CURRENT TREATMENT: letrozole 2.59m daily  BREAST CANCER HISTORY: From [name]'s new patient evaluation note dated 08/18/2011:  "This is a pleasant 74year old woman , [place] who is referred for evaluation of a breast mass. This woman and has not had a mammogram since approximately 2009. She noted a mass in the medial portion of the left breast about a year ago so. [name] medical attention for this and was referred for a mammogram. Bilateral mammogram left breast ultrasound performed 08/09/2011 showed a firm at 71 cm mass the parasternal left breast 1:00 position with retraction of the overlying skin. No other abnormalities were seen. Ultrasound showed a hypoechoic irregular mass with margins. This area was biopsied on the same day and showed a invasive ductal cancer ER +100% PR +17% or less for the next 20% HER-2 was nonamplified. This is felt to be grade 2. An MRI scan of both breasts performed on 08/13/2011 showed an irregular enhancing mass with spiculated margins in the lower quadrant of the left breast measuring 1.5 x 1.7 x 1.9 cm this was abutting the pectoralis muscle enhancement was seen extending along the pectoralis muscle. "  INTERVAL HISTORY: [name] today for follow up of her breast cancer. She continues on letrozole. She obtains it at a very good price. She has no side effects whatsoever that she attributes to it including no hot flashes, no problems with vaginal dryness, and no arthralgias or myalgias.  REVIEW OF SYSTEMS: CNakieshais not exercising regularly. She has some heartburn problems. She has back pain, feels anxious and depressed, and tells me her diabetes is  "okay". Otherwise a detailed review of systems today was noncontributory  PAST MEDICAL HISTORY: Past Medical History  Diagnosis Date  . Insomnia, unspecified   . Hypertension   . Hyperlipidemia   . GERD (gastroesophageal reflux disease)   . Anxiety   . Other abnormal glucose   . Osteoporosis, unspecified     with prev fosamax treatment  . Anemia   . Blood transfusion   . Diabetes mellitus 2011    type II, diet controlled  . Cancer 08/09/11 DX     LEFT  . Breast cancer 03/01/12    left breast lumpectomy=invasive ductal ca,grade I/III,INVILVES SKELATAL MUSCLE   er=+,pr=NEG. HER2 NEG.  . History of radiation therapy     RIGHT JAW 5 TXS FOR ABSESSED TOOTH IN HER 30'S   . Arthritis     osteoporosis  . Blood transfusion without reported diagnosis 1983    prbc  . Depression   . History of radiation therapy 04/10/12-05/26/12    left breast  . Diverticulosis     PAST SURGICAL HISTORY: Past Surgical History  Procedure Laterality Date  . Bilateral oophorectomy  1987  . Tubal ligation  1975  . Orif femur fracture Right     titanium rod  . Inguinal hernia repair Left 1999    LIH  . Sigmoidoscopy    . Breast lumpectomy  03/01/12    LEFT=INVASIVE DUCTAL CA,gRADE i/iii,INVASION AT PSTERIOR RESECTION MARGIN AND INVLOVES SKE;ATAL MUSCLE,PERINEURAL INVASION IDENTIFIED  . Abdominal hysterectomy  1983    b/ls alpingooph orectomy 4 years later    FAMILY HISTORY Family History  Problem Relation Age of Onset  . Diabetes Mother   . Alcohol abuse Father   . Diabetes Sister   . Bladder Cancer Brother   . Diabetes Sister   . Bladder Cancer Son   . Colon cancer Neg Hx   . Esophageal cancer Neg Hx   . Pancreatic cancer Neg Hx   . Rectal cancer Neg Hx   . Stomach cancer Neg Hx     GYNECOLOGIC HISTORY: Menarche at age 48, Menopause at age  41,  no history of hormone replacement therapy.  SOCIAL HISTORY: The patient's husband died unexpectedly after dental surgery in 05/2012.  The  patient has 4 adult children, 2 girls and 2 boys.  She also has 4 grandchildren.  [name] currently lives in [place] and resides with her daughter [name].  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 There is also a 85 year old grandson in the home. Another grandson committed suicide August 2015. The patient is a retired Warden/ranger.  She enjoys doing crafts, sewing, working on the computer, and playing with her [place].    ADVANCED DIRECTIVES: Not on file  HEALTH MAINTENANCE: Social History  Substance Use Topics  . Smoking status: Never Smoker   . Smokeless tobacco: Never Used  . Alcohol Use: No    Colonoscopy: Not on file PAP: Not on file Bone density: The patient's last bone density scan on 08/25/2011 showed a T score of -4.9 (osteoporosis). Lipid panel: 06/17/2011   Allergies  Allergen Reactions  . Bisphosphonates     Unclear relation to meds, but pt had long bone fracture after treatment with fosamax  . Diclofenac Potassium     Not sure  . Ezetimibe-Simvastatin     myalgias  . Flexeril [Cyclobenzaprine]     sedation  . Naproxen Sodium     Not sure  . Simvastatin     myalgias  . Sulfonamide Derivatives   . Vicodin [Hydrocodone-Acetaminophen] Nausea Only    Current Outpatient Prescriptions  Medication Sig Dispense Refill  . ACCU-CHEK AVIVA PLUS test strip USE ONE STRIP ONCE DAILY OR AS DIRECTED TO CHECK BLOOD SUGAR 100 each 1  . ACCU-CHEK SOFTCLIX LANCETS lancets USE ONCE DAILY AS DIRECTED 100 each 3  . amLODipine (NORVASC) 5 MG tablet TAKE ONE TABLET BY MOUTH ONCE DAILY 90 tablet 0  . buPROPion (WELLBUTRIN SR) 100 MG 12 hr tablet Take 1 tablet (100 mg total) by mouth daily. 30 tablet 12  . CALCIUM CITRATE PO Take 500 mg by mouth once.    . cholecalciferol (VITAMIN D) 1000 UNITS tablet Take 1,000 Units by mouth daily.    Marland Kitchen letrozole (FEMARA) 2.5 MG tablet Take 1 tablet (2.5 mg total) by mouth daily. 90 tablet 4  . LORazepam  (ATIVAN) 2 MG tablet TAKE ONE TABLET BY MOUTH THREE TIMES DAILY AS NEEDED FOR ANXIETY AND  THEN  TAKE  2  TABLETS  AT  BEDTIME 150 tablet 2  . metFORMIN (GLUCOPHAGE) 500 MG tablet Take 1 tablet (500 mg total) by mouth 2 (two) times daily with a meal. Start with 1 tab a day, increase after 1 week if tolerated. 180 tablet 3  . methocarbamol (ROBAXIN) 500 MG tablet TAKE ONE TABLET BY MOUTH 4 TIMES DAILY 40 tablet 3  . metoprolol (LOPRESSOR) 50 MG tablet TAKE ONE TABLET BY MOUTH TWICE DAILY 180 tablet 3  . Omega-3 Fatty Acids (FISH OIL) 1000 MG CAPS Take 1 capsule by mouth 2 (two) times daily.     Marland Kitchen  omeprazole (PRILOSEC) 20 MG capsule Take 1 capsule (20 mg total) by mouth 2 (two) times daily before a meal.    . sertraline (ZOLOFT) 100 MG tablet Take 1.5 tablets (150 mg total) by mouth daily. 135 tablet 2   No current facility-administered medications for this visit.    OBJECTIVE: Middle-aged white woman in no acute distress Filed Vitals:   02/20/15 1429  BP: 122/64  Pulse: 62  Temp: 98.3 F (36.8 C)  Resp: 18     Body mass index is 27.75 kg/(m^2).      ECOG FS: 0  Sclerae unicteric, EOMs intact Oropharynx clear, dentition in fair repair No cervical or supraclavicular adenopathy Lungs no rales or rhonchi Heart regular rate and rhythm Abd soft, nontender, positive bowel sounds MSK kyphosis but no focal spinal tenderness, no upper extremity lymphedema Neuro: nonfocal, well oriented, appropriate affect Breasts: The right breast is unremarkable P of the left breast is status post lumpectomy and radiation. There is no evidence of local recurrence. The left axilla is benign.    LAB RESULTS: Lab Results  Component Value Date   WBC 10.4* 02/20/2015   NEUTROABS 7.6* 02/20/2015   HGB 13.9 02/20/2015   HCT 40.7 02/20/2015   MCV 88.4 02/20/2015   PLT 212 02/20/2015      Chemistry      Component Value Date/Time   NA 135* 02/20/2015 1404   NA 132* 01/21/2015 1513   K 4.1 02/20/2015  1404   K 4.0 01/21/2015 1513   CL 96 01/21/2015 1513   CL 100 03/22/2012 1200   CO2 25 02/20/2015 1404   CO2 30 01/21/2015 1513   BUN 9.0 02/20/2015 1404   BUN 12 01/21/2015 1513   CREATININE 0.8 02/20/2015 1404   CREATININE 0.63 01/21/2015 1513   CREATININE 0.52 10/05/2013 1527      Component Value Date/Time   CALCIUM 9.8 02/20/2015 1404   CALCIUM 10.1 01/21/2015 1513   ALKPHOS 82 02/20/2015 1404   ALKPHOS 66 01/21/2015 1513   AST 44* 02/20/2015 1404   AST 65* 01/21/2015 1513   ALT 52 02/20/2015 1404   ALT 57* 01/21/2015 1513   BILITOT 0.89 02/20/2015 1404   BILITOT 0.5 01/21/2015 1513      Lab Results  Component Value Date   LABCA2 24 12/31/2011    Urinalysis    Component Value Date/Time   COLORURINE YELLOW 10/17/2010 1330   APPEARANCEUR CLEAR 10/17/2010 1330   LABSPEC 1.005 03/22/2012 1304   LABSPEC 1.013 10/17/2010 1330   PHURINE 6.0 03/22/2012 1304   PHURINE 6.0 10/17/2010 1330   GLUCOSEU NEGATIVE 10/17/2010 1330   HGBUR Small 03/22/2012 1304   HGBUR TRACE* 10/17/2010 1330   BILIRUBINUR Negative 03/22/2012 1304   BILIRUBINUR NEGATIVE 10/17/2010 1330   KETONESUR Negative 03/22/2012 1304   KETONESUR NEGATIVE 10/17/2010 1330   PROTEINUR Negative 03/22/2012 1304   PROTEINUR NEGATIVE 10/17/2010 1330   UROBILINOGEN 0.2 10/17/2010 1330   NITRITE Negative 03/22/2012 1304   NITRITE NEGATIVE 10/17/2010 1330   LEUKOCYTESUR Small 03/22/2012 1304   LEUKOCYTESUR NEGATIVE 10/17/2010 1330    STUDIES: CLINICAL DATA: Followup from left lumpectomy, performed in 2013. No current complaints.  EXAM: DIGITAL DIAGNOSTIC BILATERAL MAMMOGRAM WITH 3D TOMOSYNTHESIS AND CAD  COMPARISON: Previous exam(s).  ACR Breast Density Category b: There are scattered areas of fibroglandular density.  FINDINGS: Mild architectural distortion on the left is stable reflecting postsurgical scarring. There are no discrete masses or other areas of architectural distortion. There are  no suspicious calcifications.  No mammographic change.  Mammographic images were processed with CAD.  IMPRESSION: No evidence of recurrent or new breast malignancy. Benign postsurgical changes on the left.  RECOMMENDATION: Diagnostic mammography in 1 year per standard post lumpectomy protocol.  I have discussed the findings and recommendations with the patient. Results were also provided in writing at the conclusion of the visit. If applicable, a reminder letter will be sent to the patient regarding the next appointment.  BI-RADS CATEGORY 2: Benign.   Electronically Signed  By: Lajean Manes [name]  On: 12/31/2014 15:20   ASSESSMENT: 74 y.o. [place], [place] woman: 1.  Status post left breast needle core biopsy on 08/09/2011 which showed invasive mammary carcinoma at the 9 o'clock position, estrogen receptor positive 100%, progesterone receptor positive 17%, Ki-67 20%, HER-2/neu negative by CISH with a ratio of 1.30.  2.  Neoadjuvant Femara started on 08/16/2011.  3.  Status post lateral breast MRI on 08/13/2011 which showed foci of nonspecific enhancement was seen bilaterally. An  irregular, enhancing mass with spiculated margins was seen in the lower inner quadrant of the left breast, posteriorly measuring 1.5 x 1.7 x 1.9 cm. The mass abutted the pectoralis muscle and enhancement was seen extending medially along the pectoralis muscle. Enhancement was also seen extending towards the skin where there was overlying retraction and thickening. A biopsy clip was seen in association with the mass. No other suspicious mass or enhancement was seen in either breast. No internal mammary or axillary adenopathy was present (clinical stage I, T1 N0)  4.  Status post left breast lumpectomy with sentinel node biopsy on 03/01/2012 for a stage I, ypT1c ypN0, 1.2 cm, invasive ductal carcinoma with perineural invasion, grade 1, estrogen receptor positive 99%, progesterone receptor  negative 0%, Ki-67 20%, HER-2/neu negative by CISH with a ratio of 1.11, with 0/1 metastatic left axillary lymph nodes.  5.  The patient had radiation therapy from 04/10/2012 through 05/26/2012.  6.  The patient has been on antiestrogen therapy with Femara since 08/2011.  7.  Continued grief over the recent loss of her husband and now grandson  29.  Osteoporosis: Bone density scan 12/24/2013 shows a T score of -4.2.  PLAN: Elodie is now 3 years out from her definitive surgery with no evidence of disease recurrence. This is very favorable.  She continues to tolerate the letrozole well. The plan is to continue that until March 2018, at which point the patient will "graduate" from follow-up here  We again discussed osteoporosis issues today. She had taken Fosamax in the past but stopped it because she had a fracture in her femur, after a fall, and she thought it might have contributed to that in some way. He discussed the denosumab today (Prolia) and specifically discussed the possible toxicities, side effects and complete locations of that medication, including temporary hypocalcemia and problems with osteonecrosis of the jaw particularly if she has any teeth implanted or extracted.  I asked her to check with her dentist and wrote the name down of the medication, but at this point she "wants to think about it". She will call us if she decides to give it a try  Otherwise she will see [place] again in one year. She knows to call for any other problems that may develop before that visit.   Chauncey Cruel, MD  02/20/2015, 2:44 PM

## 2015-02-20 NOTE — Telephone Encounter (Signed)
Appointments made and avs printed for patient °

## 2015-03-14 ENCOUNTER — Other Ambulatory Visit: Payer: Self-pay | Admitting: Family Medicine

## 2015-03-14 NOTE — Telephone Encounter (Signed)
Yes.  Sent. Thanks.   

## 2015-03-14 NOTE — Telephone Encounter (Signed)
Last filled 12/12/14---had wellness exam 01/14/15---did not see any note in reference to hypertension and the continuation of this medication--please advise if okay to refill

## 2015-04-12 ENCOUNTER — Other Ambulatory Visit: Payer: Self-pay | Admitting: Family Medicine

## 2015-04-14 NOTE — Telephone Encounter (Signed)
Received refill request electronically.  See office notes from 01/21/15 and confirm dose and directions. Last refill 04/10/14 #30/12 Last office visit 01/21/15 Please advise.

## 2015-04-15 NOTE — Telephone Encounter (Signed)
Sent.  No change in wellbutrin done.  SSRI was upped prev.  Thanks.

## 2015-04-23 ENCOUNTER — Other Ambulatory Visit: Payer: Self-pay | Admitting: Family Medicine

## 2015-04-23 NOTE — Telephone Encounter (Signed)
Received refill request electronically Last refill 01/19/15 #150/.2 Last office visit 01/21/15 Is it okay to refill?

## 2015-04-24 NOTE — Telephone Encounter (Signed)
rx called into pharmacy

## 2015-04-24 NOTE — Telephone Encounter (Signed)
Please call in.  Thanks.   

## 2015-04-27 ENCOUNTER — Other Ambulatory Visit: Payer: Self-pay | Admitting: Family Medicine

## 2015-04-28 DIAGNOSIS — H40033 Anatomical narrow angle, bilateral: Secondary | ICD-10-CM | POA: Diagnosis not present

## 2015-04-28 DIAGNOSIS — E119 Type 2 diabetes mellitus without complications: Secondary | ICD-10-CM | POA: Diagnosis not present

## 2015-04-28 NOTE — Telephone Encounter (Signed)
Received refill request electronically Refill request does not match medication sheet which shows once a day See office notes from 01/21/15 visit which shows patient can try twice a day Is it okay to make the change per office notes?

## 2015-04-28 NOTE — Telephone Encounter (Signed)
Yes, sent. Thanks.  ?

## 2015-05-19 ENCOUNTER — Other Ambulatory Visit: Payer: Self-pay | Admitting: Family Medicine

## 2015-07-18 DIAGNOSIS — M659 Synovitis and tenosynovitis, unspecified: Secondary | ICD-10-CM | POA: Diagnosis not present

## 2015-07-23 ENCOUNTER — Other Ambulatory Visit: Payer: Self-pay | Admitting: Family Medicine

## 2015-07-23 NOTE — Telephone Encounter (Signed)
Received refill request electronically Last refill 04/24/15 #150/2 refills Last office visit 01/21/15 Is it okay to refill?

## 2015-07-24 NOTE — Telephone Encounter (Signed)
Medication phoned to pharmacy.  

## 2015-07-24 NOTE — Telephone Encounter (Signed)
Please call in.  Thanks.   

## 2015-08-04 ENCOUNTER — Encounter: Payer: Self-pay | Admitting: Family Medicine

## 2015-08-04 ENCOUNTER — Ambulatory Visit (INDEPENDENT_AMBULATORY_CARE_PROVIDER_SITE_OTHER): Payer: Medicare Other | Admitting: Family Medicine

## 2015-08-04 VITALS — BP 146/64 | HR 65 | Temp 98.4°F | Wt 146.8 lb

## 2015-08-04 DIAGNOSIS — E1165 Type 2 diabetes mellitus with hyperglycemia: Secondary | ICD-10-CM | POA: Diagnosis not present

## 2015-08-04 DIAGNOSIS — E119 Type 2 diabetes mellitus without complications: Secondary | ICD-10-CM

## 2015-08-04 DIAGNOSIS — Z23 Encounter for immunization: Secondary | ICD-10-CM

## 2015-08-04 DIAGNOSIS — R300 Dysuria: Secondary | ICD-10-CM

## 2015-08-04 DIAGNOSIS — IMO0001 Reserved for inherently not codable concepts without codable children: Secondary | ICD-10-CM

## 2015-08-04 DIAGNOSIS — M62830 Muscle spasm of back: Secondary | ICD-10-CM

## 2015-08-04 LAB — POC URINALSYSI DIPSTICK (AUTOMATED)
BILIRUBIN UA: NEGATIVE
Blood, UA: NEGATIVE
KETONES UA: NEGATIVE
LEUKOCYTES UA: NEGATIVE
Nitrite, UA: POSITIVE
PH UA: 6
Protein, UA: NEGATIVE
Spec Grav, UA: 1.025
Urobilinogen, UA: 4

## 2015-08-04 LAB — HEMOGLOBIN A1C: Hgb A1c MFr Bld: 9.5 % — ABNORMAL HIGH (ref 4.6–6.5)

## 2015-08-04 MED ORDER — CEPHALEXIN 250 MG PO CAPS: 250.0000 mg | ORAL_CAPSULE | Freq: Two times a day (BID) | ORAL | Status: DC

## 2015-08-04 MED ORDER — METHOCARBAMOL 500 MG PO TABS: 500.0000 mg | ORAL_TABLET | Freq: Four times a day (QID) | ORAL | Status: DC

## 2015-08-04 NOTE — Progress Notes (Signed)
Pre visit review using our clinic review tool, if applicable. No additional management support is needed unless otherwise documented below in the visit note.  Anxiety is fairly well controlled.  No ADE on med.  Still working through family stressors, with death of grandson noted.    She had some diarrhea recently, unclear if from a transient GI illness.  Viral gastroenteritis present in the community recently.  D/w pt.  She had a fever but that broke in the meantime.  Diarrhea resolved.    Now with urinary sx.  D/w pt.  U/a abnormal.  Burning with urination.  This is atypical for her.  Urine has looked normal.  No back pain.  No abd pain.  No FCNAVD.    Still with back spasms.  Had trouble getting robaxin prev, unclear if will be cheaper on formulary 2017.  Didn't tolerate flexeril.  See AVS.    Dm2.  Due for f/u labs.  Not checking sugar recently.  No ADE on meds.  Compliant.   Meds, vitals, and allergies reviewed.   ROS: See HPI.  Otherwise, noncontributory.  nad ncat Neck supple, no LA rrr ctab abd soft, not ttp, no rebound No CVA pain  no BLE edema.

## 2015-08-04 NOTE — Patient Instructions (Signed)
Drink plenty of water and start the antibiotics today.   Go to the lab on the way out.  We'll contact you with your lab report. Ask the pharmacy about the price on methocarbamol.  See if anything else is cheaper (but not flexeri). Glad to see you. Take care.

## 2015-08-05 ENCOUNTER — Other Ambulatory Visit: Payer: Self-pay | Admitting: Family Medicine

## 2015-08-05 DIAGNOSIS — R300 Dysuria: Secondary | ICD-10-CM | POA: Insufficient documentation

## 2015-08-05 DIAGNOSIS — M62830 Muscle spasm of back: Secondary | ICD-10-CM | POA: Insufficient documentation

## 2015-08-05 NOTE — Assessment & Plan Note (Signed)
See notes on labs. 

## 2015-08-05 NOTE — Assessment & Plan Note (Signed)
Keflex, ucx, fu prn.  >25 minutes spent in face to face time with patient, >50% spent in counselling or coordination of care.

## 2015-08-05 NOTE — Assessment & Plan Note (Signed)
Still with back spasms.  Had trouble getting robaxin prev, unclear if will be cheaper on formulary 2017.  Didn't tolerate flexeril.  See AVS.

## 2015-08-06 LAB — URINE CULTURE: Colony Count: >=100000

## 2015-08-08 ENCOUNTER — Telehealth: Payer: Self-pay | Admitting: *Deleted

## 2015-08-08 ENCOUNTER — Other Ambulatory Visit: Payer: Self-pay | Admitting: Family Medicine

## 2015-08-08 MED ORDER — CYCLOBENZAPRINE HCL 10 MG PO TABS: 5.0000 mg | ORAL_TABLET | Freq: Two times a day (BID) | ORAL | Status: DC | PRN

## 2015-08-08 MED ORDER — NITROFURANTOIN MONOHYD MACRO 100 MG PO CAPS: 100.0000 mg | ORAL_CAPSULE | Freq: Two times a day (BID) | ORAL | Status: DC

## 2015-08-08 NOTE — Telephone Encounter (Signed)
Patient says she doesn't think the Robaxin is working well for her and would like to try Flexeril but her pharmacist says she is listed as being allergic to Flexeril.  She is not aware of this and says she took one of someone else's the other day and didn't have a problem with it.

## 2015-08-08 NOTE — Telephone Encounter (Signed)
Left detailed message on voicemail.  

## 2015-08-08 NOTE — Telephone Encounter (Signed)
It was a sedation issue prev.  If she can tolerate it, then I'll send it.  Med list updated.  Thanks.

## 2015-08-14 ENCOUNTER — Telehealth: Payer: Self-pay | Admitting: Family Medicine

## 2015-08-14 MED ORDER — FLUCONAZOLE 150 MG PO TABS: 150.0000 mg | ORAL_TABLET | Freq: Once | ORAL | Status: DC

## 2015-08-14 NOTE — Telephone Encounter (Signed)
Medication phoned to pharmacy.  

## 2015-08-14 NOTE — Telephone Encounter (Signed)
Please call in the diflucan.  Phone number- see [phone].  I didn't see the pharmacy in the EMR in [facility], so I couldn't send it via EMR.   Repeat in 1 week if not better.  Thanks.

## 2015-08-14 NOTE — Telephone Encounter (Signed)
PLEASE NOTE: All timestamps contained within this report are represented as [place] Standard Time. CONFIDENTIALTY NOTICE: This fax transmission is intended only for the addressee. It contains information that is legally privileged, confidential or otherwise protected from use or disclosure. If you are not the intended recipient, you are strictly prohibited from reviewing, disclosing, copying using or disseminating any of this information or taking any action in reliance on or regarding this information. If you have received this fax in error, please notify us immediately by telephone so that we can arrange for its return to [place]. Phone: [phone], Toll-Free: [phone], Fax: [phone] Page: 1 of 1 Call Id: [id number] [place] Patient Name: [name] DOB: 1940-06-22 Initial Comment Caller states she is requesting a script- she was on an abx for a UTI- now she has a yeast infection. Nurse Assessment Nurse: Marcelline Deist, RN, Lynda Date/Time (Eastern Time): 08/14/2015 1:22:06 PM Confirm and document reason for call. If symptomatic, describe symptoms. You must click the next button to save text entered. ---Caller states she is requesting a script. She is on an antibiotic for a UTI, now she has a yeast infection. Has itching, no discharge. Has the patient traveled out of the country within the last 30 days? ---Not Applicable Does the patient have any new or worsening symptoms? ---Yes Will a triage be completed? ---Yes Related visit to physician within the last 2 weeks? ---Yes Does the PT have any chronic conditions? (i.e. diabetes, asthma, etc.) ---Yes List chronic conditions. ---diabetic, breast cancer hx, on BP rx Is this a behavioral health or substance abuse call? ---No Guidelines Guideline Title Affirmed Question Affirmed Notes [place] Symptoms MODERATE-SEVERE itching (i.e., interferes with  school, work, or sleep) Final Disposition User See Physician within [place], Therapist, sports, Kermit Balo Comments Caller is going to be leaving to go to her son's house in [place]. She can not come in to be seen, but if possible would like something for the yeast infection/itching called in to the [facility] in [facility]. The store # is [phone]. Allergic to sulfa drugs, can't take Aleve & one other rx that she can't recall the name of. Please contact her at this #. Caller states her UTI symptoms have improved, is almost done with antibiotics. Referrals REFERRED TO PCP OFFICE Disagree/Comply: Disagree Disagree/Comply Reason: Disagree with instructions

## 2015-08-19 ENCOUNTER — Other Ambulatory Visit: Payer: Self-pay | Admitting: Family Medicine

## 2015-10-21 ENCOUNTER — Other Ambulatory Visit: Payer: Self-pay | Admitting: Nurse Practitioner

## 2015-10-30 ENCOUNTER — Other Ambulatory Visit: Payer: Self-pay | Admitting: Family Medicine

## 2015-11-03 NOTE — Telephone Encounter (Signed)
Electronic refill request. Last Filled:    150 tablet 2 07/24/2015  Has upcoming appt scheduled.  Please advise.

## 2015-11-04 ENCOUNTER — Other Ambulatory Visit (INDEPENDENT_AMBULATORY_CARE_PROVIDER_SITE_OTHER): Payer: Medicare Other

## 2015-11-04 DIAGNOSIS — E118 Type 2 diabetes mellitus with unspecified complications: Secondary | ICD-10-CM

## 2015-11-04 DIAGNOSIS — E1165 Type 2 diabetes mellitus with hyperglycemia: Secondary | ICD-10-CM

## 2015-11-04 DIAGNOSIS — IMO0002 Reserved for concepts with insufficient information to code with codable children: Secondary | ICD-10-CM

## 2015-11-04 LAB — HEMOGLOBIN A1C: Hgb A1c MFr Bld: 9.1 % — ABNORMAL HIGH (ref 4.6–6.5)

## 2015-11-04 NOTE — Telephone Encounter (Signed)
Medication phoned to pharmacy.  

## 2015-11-04 NOTE — Telephone Encounter (Signed)
Please call in.  Thanks.   

## 2015-11-07 ENCOUNTER — Ambulatory Visit (INDEPENDENT_AMBULATORY_CARE_PROVIDER_SITE_OTHER): Payer: Medicare Other | Admitting: Family Medicine

## 2015-11-07 ENCOUNTER — Encounter: Payer: Self-pay | Admitting: Family Medicine

## 2015-11-07 VITALS — BP 142/86 | HR 60 | Temp 98.0°F | Wt 142.0 lb

## 2015-11-07 DIAGNOSIS — E1165 Type 2 diabetes mellitus with hyperglycemia: Secondary | ICD-10-CM

## 2015-11-07 DIAGNOSIS — IMO0001 Reserved for inherently not codable concepts without codable children: Secondary | ICD-10-CM

## 2015-11-07 MED ORDER — METFORMIN HCL 500 MG PO TABS: 500.0000 mg | ORAL_TABLET | Freq: Two times a day (BID) | ORAL | Status: DC

## 2015-11-07 NOTE — Progress Notes (Signed)
Pre visit review using our clinic review tool, if applicable. No additional management support is needed unless otherwise documented below in the visit note.  DM2.  Prev had GI upset attributed to high dose of metformin.  Able to take 1000mg  a day w/o ADE.  Prev was on up to 2000mg  a day.   A1c up.  D/w pt.   Diet d/w pt.  "Not good" per patient report.  She is going to work harder on diet, cutting out sweets.  D/w pt about a "cheat day" with some sweets once a week.  Exercise is limited. D/w pt about getting back to walking.   Sugar has been usually ~200 in the AM.   No foot tingling.  She has sulfa allergy.    Meds, vitals, and allergies reviewed.   ROS: Per HPI unless specifically indicated in ROS section   GEN: nad, alert and oriented HEENT: mucous membranes moist NECK: supple w/o LA CV: rrr.  no murmur PULM: ctab, no inc wob ABD: soft, +bs EXT: no edema SKIN: no acute rash

## 2015-11-07 NOTE — Patient Instructions (Signed)
Try taking 2 + 1 metformin about about 10 days.  If tolerated then go up to 2 + 2.   If you have an upset stomach, then cut back to the previous dose.  Try to walk a little more and cut back on sweets.   It's okay to have a "cheat day" if that helps you stay on your diet the rest of the week.  Update me about your sugars in about 2 weeks, sooner if needed.  Take care.  Glad to see you.

## 2015-11-10 NOTE — Assessment & Plan Note (Signed)
Prev had GI upset attributed to high dose of metformin. Able to take 1000mg  a day w/o ADE. Prev was on up to 2000mg  a day.  A1c up. D/w pt.  Diet d/w pt. "Not good" per patient report. She is going to work harder on diet, cutting out sweets. D/w pt about a "cheat day" with some sweets once a week.  Exercise is limited. D/w pt about getting back to walking.  She'll try gradual in metformin dose and if GI upset, then cut back to the previous dose.  Update me about your sugars in about 2 weeks, sooner if needed.  She agrees.  She is trying to avoid insulin, d/w pt.

## 2015-11-25 ENCOUNTER — Other Ambulatory Visit: Payer: Self-pay | Admitting: Family Medicine

## 2016-01-05 ENCOUNTER — Other Ambulatory Visit: Payer: Self-pay | Admitting: Family Medicine

## 2016-01-05 NOTE — Telephone Encounter (Signed)
Electronic refill request. Last Filled:    150 tablet 2 11/04/2015  Last office visit:   11/07/15  Please advise.

## 2016-01-06 ENCOUNTER — Other Ambulatory Visit: Payer: Self-pay | Admitting: Family Medicine

## 2016-01-06 NOTE — Telephone Encounter (Signed)
Tried to phone pharmacy numerous times with no answer.  Rx refused electronically and notation made.

## 2016-01-06 NOTE — Telephone Encounter (Signed)
Noted. Thanks.  Please call in.

## 2016-01-06 NOTE — Telephone Encounter (Signed)
This refill was requested earlier today and Dr.'s response was that it looks like she should have a refill remaining.  Phoned pharmacy and the Rx was transferred to a pharmacy in [facility] for 1 refill.  There is a refill remaining on the original Rx but it cannot be transferred more than once so a new Rx has to be issued.

## 2016-01-06 NOTE — Telephone Encounter (Signed)
She should have a refill left.  Please check with pharmacy.  Thanks.

## 2016-01-07 NOTE — Telephone Encounter (Signed)
Rx called to pharmacy as instructed. 

## 2016-01-21 ENCOUNTER — Other Ambulatory Visit: Payer: Self-pay | Admitting: Family Medicine

## 2016-01-21 NOTE — Telephone Encounter (Signed)
Sent. Thanks.   

## 2016-02-10 DIAGNOSIS — J40 Bronchitis, not specified as acute or chronic: Secondary | ICD-10-CM | POA: Diagnosis not present

## 2016-02-17 ENCOUNTER — Encounter: Payer: Self-pay | Admitting: Family Medicine

## 2016-02-18 ENCOUNTER — Encounter: Payer: Self-pay | Admitting: Family Medicine

## 2016-02-19 ENCOUNTER — Other Ambulatory Visit: Payer: Self-pay | Admitting: Family Medicine

## 2016-02-19 ENCOUNTER — Other Ambulatory Visit: Payer: Self-pay | Admitting: Adult Health

## 2016-02-19 DIAGNOSIS — E1165 Type 2 diabetes mellitus with hyperglycemia: Principal | ICD-10-CM

## 2016-02-19 DIAGNOSIS — IMO0001 Reserved for inherently not codable concepts without codable children: Secondary | ICD-10-CM

## 2016-02-19 DIAGNOSIS — Z853 Personal history of malignant neoplasm of breast: Secondary | ICD-10-CM

## 2016-02-19 MED ORDER — CYCLOBENZAPRINE HCL 10 MG PO TABS: 5.0000 mg | ORAL_TABLET | Freq: Two times a day (BID) | ORAL | 1 refills | Status: DC | PRN

## 2016-02-20 ENCOUNTER — Ambulatory Visit (HOSPITAL_BASED_OUTPATIENT_CLINIC_OR_DEPARTMENT_OTHER): Payer: Medicare Other | Admitting: Adult Health

## 2016-02-20 ENCOUNTER — Other Ambulatory Visit (HOSPITAL_BASED_OUTPATIENT_CLINIC_OR_DEPARTMENT_OTHER): Payer: Medicare Other

## 2016-02-20 ENCOUNTER — Telehealth: Payer: Self-pay | Admitting: Adult Health

## 2016-02-20 ENCOUNTER — Other Ambulatory Visit: Payer: Self-pay | Admitting: *Deleted

## 2016-02-20 ENCOUNTER — Other Ambulatory Visit: Payer: Self-pay | Admitting: General Surgery

## 2016-02-20 VITALS — BP 161/71 | HR 59 | Temp 98.6°F | Resp 18 | Ht 61.0 in | Wt 139.4 lb

## 2016-02-20 DIAGNOSIS — Z853 Personal history of malignant neoplasm of breast: Secondary | ICD-10-CM

## 2016-02-20 DIAGNOSIS — Z79811 Long term (current) use of aromatase inhibitors: Secondary | ICD-10-CM

## 2016-02-20 DIAGNOSIS — M810 Age-related osteoporosis without current pathological fracture: Secondary | ICD-10-CM

## 2016-02-20 DIAGNOSIS — E119 Type 2 diabetes mellitus without complications: Secondary | ICD-10-CM | POA: Diagnosis not present

## 2016-02-20 DIAGNOSIS — R740 Nonspecific elevation of levels of transaminase and lactic acid dehydrogenase [LDH]: Secondary | ICD-10-CM

## 2016-02-20 DIAGNOSIS — C50812 Malignant neoplasm of overlapping sites of left female breast: Secondary | ICD-10-CM

## 2016-02-20 DIAGNOSIS — Z780 Asymptomatic menopausal state: Secondary | ICD-10-CM

## 2016-02-20 DIAGNOSIS — E1165 Type 2 diabetes mellitus with hyperglycemia: Secondary | ICD-10-CM

## 2016-02-20 DIAGNOSIS — IMO0001 Reserved for inherently not codable concepts without codable children: Secondary | ICD-10-CM

## 2016-02-20 LAB — CBC WITH DIFFERENTIAL/PLATELET
BASO%: 1.4 % (ref 0.0–2.0)
BASOS ABS: 0.1 10*3/uL (ref 0.0–0.1)
EOS%: 2 % (ref 0.0–7.0)
Eosinophils Absolute: 0.1 10*3/uL (ref 0.0–0.5)
HEMATOCRIT: 39.5 % (ref 34.8–46.6)
HGB: 13.4 g/dL (ref 11.6–15.9)
LYMPH#: 1.5 10*3/uL (ref 0.9–3.3)
LYMPH%: 21.5 % (ref 14.0–49.7)
MCH: 30 pg (ref 25.1–34.0)
MCHC: 33.9 g/dL (ref 31.5–36.0)
MCV: 88.4 fL (ref 79.5–101.0)
MONO#: 0.4 10*3/uL (ref 0.1–0.9)
MONO%: 6 % (ref 0.0–14.0)
NEUT#: 4.7 10*3/uL (ref 1.5–6.5)
NEUT%: 69.1 % (ref 38.4–76.8)
Platelets: 250 10*3/uL (ref 145–400)
RBC: 4.47 10*6/uL (ref 3.70–5.45)
RDW: 13.3 % (ref 11.2–14.5)
WBC: 6.8 10*3/uL (ref 3.9–10.3)

## 2016-02-20 LAB — COMPREHENSIVE METABOLIC PANEL
ALT: 47 U/L (ref 0–55)
AST: 43 U/L — AB (ref 5–34)
Albumin: 3.5 g/dL (ref 3.5–5.0)
Alkaline Phosphatase: 67 U/L (ref 40–150)
Anion Gap: 10 mEq/L (ref 3–11)
BUN: 12.8 mg/dL (ref 7.0–26.0)
CHLORIDE: 102 meq/L (ref 98–109)
CO2: 26 meq/L (ref 22–29)
CREATININE: 0.7 mg/dL (ref 0.6–1.1)
Calcium: 9.8 mg/dL (ref 8.4–10.4)
EGFR: 84 mL/min/{1.73_m2} — ABNORMAL LOW (ref >90)
GLUCOSE: 206 mg/dL — AB (ref 70–140)
POTASSIUM: 4.1 meq/L (ref 3.5–5.1)
SODIUM: 137 meq/L (ref 136–145)
Total Bilirubin: 0.35 mg/dL (ref 0.20–1.20)
Total Protein: 7.7 g/dL (ref 6.4–8.3)

## 2016-02-20 MED ORDER — LETROZOLE 2.5 MG PO TABS: 2.5000 mg | ORAL_TABLET | Freq: Every day | ORAL | 4 refills | Status: DC

## 2016-02-20 NOTE — Progress Notes (Signed)
CLINIC:  Survivorship   REASON FOR VISIT:  Routine follow-up for history of breast cancer.   BRIEF ONCOLOGIC HISTORY:  (from [name] visit on 02/20/15)     INTERVAL HISTORY:  [name] presents to the Survivorship Clinic today for routine follow-up for her history of breast cancer.  Overall, she reports feeling relatively well since her last visit.  She did recently visit Urgent Care for cough/cold symptoms and was given cough medicine and antibiotics; her symptoms are improving.    She has some occasional nausea and vomiting since her diabetes diagnosis; she is on Metformin and also has occasional diarrhea and heartburn.  She has chronic back pain and arthritis; she takes Flexeril for her pain which minimally improves her symptoms; the back pain gets better with rest; heat helps the pain as well.  She has some anxiety and depression.  She is not exercising regularly.   She remains on the Letrozole with good tolerance.  She will complete 5 years of anti-estrogen therapy in 08/2016.   I received a message from her PCP, [name], asking that we collect a Hgb A1c today with her blood work, which I was happy to order. Results pending.   REVIEW OF SYSTEMS:  Review of Systems  Constitutional: Negative.   HENT:       Recent cough and cold symptoms (improving)  Respiratory: Positive for cough.   Cardiovascular: Negative.   Gastrointestinal: Positive for heartburn, nausea and vomiting. Negative for constipation and diarrhea.  Genitourinary: Negative.   Musculoskeletal: Positive for back pain and myalgias.  Skin: Negative.   Neurological: Positive for headaches.  Endo/Heme/Allergies:       (+) diabetes  Psychiatric/Behavioral: Positive for depression. The patient is nervous/anxious.   GU: Denies vaginal bleeding, discharge, or dryness.  Breast: Denies any new nodularity, masses, nipple changes, or nipple discharge. Endorses (L) breast & axillary tenderness.    A 14-point  review of systems was completed and was negative, except as noted above.    PAST MEDICAL/SURGICAL HISTORY:  Past Medical History:  Diagnosis Date  . Anemia   . Anxiety   . Arthritis    osteoporosis  . Blood transfusion   . Blood transfusion without reported diagnosis 1983   prbc  . Breast cancer ([place]) 03/01/12   left breast lumpectomy=invasive ductal ca,grade I/III,INVILVES SKELATAL MUSCLE   er=+,pr=NEG. HER2 NEG.  . Cancer ([place]) 08/09/11 DX    LEFT  . Depression   . Diabetes mellitus 2011   type II, diet controlled  . Diverticulosis   . GERD (gastroesophageal reflux disease)   . History of radiation therapy    RIGHT JAW 5 TXS FOR ABSESSED TOOTH IN HER 30'S   . History of radiation therapy 04/10/12-05/26/12   left breast  . Hyperlipidemia   . Hypertension   . Insomnia, unspecified   . Osteoporosis, unspecified    with prev fosamax treatment  . Other abnormal glucose    Past Surgical History:  Procedure Laterality Date  . ABDOMINAL HYSTERECTOMY  1983   b/ls alpingooph orectomy 4 years later  . BILATERAL OOPHORECTOMY  1987  . BREAST LUMPECTOMY  03/01/12   LEFT=INVASIVE DUCTAL CA,gRADE i/iii,INVASION AT PSTERIOR RESECTION MARGIN AND INVLOVES SKE;ATAL MUSCLE,PERINEURAL INVASION IDENTIFIED  . INGUINAL HERNIA REPAIR Left 1999   LIH  . ORIF FEMUR FRACTURE Right    titanium rod  . SIGMOIDOSCOPY    . TUBAL LIGATION  1975     ALLERGIES:  Allergies  Allergen Reactions  . Bisphosphonates  Unclear relation to meds, but pt had long bone fracture after treatment with fosamax  . Diclofenac Potassium     Not sure  . Ezetimibe-Simvastatin     myalgias  . Naproxen Sodium     Not sure  . Simvastatin     myalgias  . Sulfonamide Derivatives   . Vicodin [Hydrocodone-Acetaminophen] Nausea Only     CURRENT MEDICATIONS:  Outpatient Encounter Prescriptions as of 02/20/2016  Medication Sig  . ACCU-CHEK AVIVA PLUS test strip USE ONE STRIP TO CHECK GLUCOSE ONCE DAILY OR AS  DIRECTED  . ACCU-CHEK SOFTCLIX LANCETS lancets USE ONCE DAILY AS DIRECTED  . amLODipine (NORVASC) 5 MG tablet TAKE ONE TABLET BY MOUTH ONCE DAILY  . buPROPion (WELLBUTRIN SR) 100 MG 12 hr tablet TAKE ONE TABLET BY MOUTH ONCE DAILY  . CALCIUM CITRATE PO Take 500 mg by mouth once.  . cholecalciferol (VITAMIN D) 1000 UNITS tablet Take 1,000 Units by mouth daily.  . cyclobenzaprine (FLEXERIL) 10 MG tablet Take 0.5-1 tablets (5-10 mg total) by mouth 2 (two) times daily as needed for muscle spasms (sedation caution).  . LORazepam (ATIVAN) 2 MG tablet TAKE ONE TABLET BY MOUTH THREE TIMES DAILY AS NEEDED FOR ANXIETY AND TWO AT BEDTIME  . metFORMIN (GLUCOPHAGE) 500 MG tablet Take 1-2 tablets (500-1,000 mg total) by mouth 2 (two) times daily with a meal.  . metoprolol (LOPRESSOR) 50 MG tablet TAKE ONE TABLET BY MOUTH TWICE DAILY  . Omega-3 Fatty Acids (FISH OIL) 1000 MG CAPS Take 1 capsule by mouth 2 (two) times daily.   Marland Kitchen omeprazole (PRILOSEC) 20 MG capsule Take 1 capsule (20 mg total) by mouth 2 (two) times daily before a meal.  . sertraline (ZOLOFT) 100 MG tablet TAKE ONE AND ONE-HALF TABLETS BY MOUTH ONCE DAILY (Patient taking differently: Pt  taking one tablet daily)  . [DISCONTINUED] letrozole (FEMARA) 2.5 MG tablet Take 1 tablet (2.5 mg total) by mouth daily.  . [DISCONTINUED] metoprolol (LOPRESSOR) 50 MG tablet TAKE ONE TABLET BY MOUTH TWICE DAILY (Patient not taking: Reported on 02/20/2016)  . [DISCONTINUED] omeprazole (PRILOSEC) 20 MG capsule TAKE ONE CAPSULE BY MOUTH TWICE DAILY AS NEEDED (Patient not taking: Reported on 02/20/2016)   No facility-administered encounter medications on file as of 02/20/2016.      ONCOLOGIC FAMILY HISTORY:  Family History  Problem Relation Age of Onset  . Diabetes Mother   . Alcohol abuse Father   . Diabetes Sister   . Bladder Cancer Brother   . Diabetes Sister   . Bladder Cancer Son   . Colon cancer Neg Hx   . Esophageal cancer Neg Hx   . Pancreatic cancer  Neg Hx   . Rectal cancer Neg Hx   . Stomach cancer Neg Hx     GENETIC COUNSELING/TESTING: None.   SOCIAL HISTORY:  [name] is widowed and lives in [place], [place].  She has 4 children, 2 sons & 2 daughters; she has 4 grandchildren. [name] is retired.  She any current or history of tobacco, alcohol, or illicit drug use.     PHYSICAL EXAMINATION:  Vital Signs: Vitals:   02/20/16 1137  BP: (!) 161/71  Pulse: (!) 59  Resp: 18  Temp: 98.6 F (37 C)   Filed Weights   02/20/16 1137  Weight: 139 lb 6.4 oz (63.2 kg)   General: Well-nourished, well-appearing female in no acute distress.  She is unaccompanied today.   HEENT: Head is normocephalic.  Pupils equal and reactive to light.  Conjunctivae clear without exudate.  Sclerae anicteric. Oral mucosa is pink, moist.  Oropharynx is pink without lesions or erythema.  Lymph: No cervical, supraclavicular, or infraclavicular lymphadenopathy noted on palpation.  Cardiovascular: Regular rate and rhythm.Marland Kitchen Respiratory: Clear to auscultation bilaterally. Chest expansion symmetric; breathing non-labored.  Breast Exam:  -Left breast: Entire breast very tender to palpation; worse near lumpectomy scar. No appreciable masses on palpation. No skin redness, thickening, or peau d'orange appearance; mild distortion in symmetry at previous lumpectomy site; healed scar without erythema or nodularity.  -Right breast: Mild right breast tenderness. No appreciable masses on palpation. No skin redness, thickening, or peau d'orange appearance.  -Axilla: No axillary adenopathy bilaterally.  GI: Abdomen soft and round; non-tender, non-distended. Bowel sounds normoactive. No hepatosplenomegaly.   GU: Deferred.  Neuro/MSK: No focal deficits. Steady gait. Moderate kyphosis.  Psych: Mood and affect normal and appropriate for situation.  Extremities: No edema. Skin: Warm and dry.  LABORATORY DATA:  CBC    Component Value Date/Time   WBC 6.8 02/20/2016 1108    WBC 8.6 01/21/2015 1513   RBC 4.47 02/20/2016 1108   RBC 4.72 01/21/2015 1513   HGB 13.4 02/20/2016 1108   HCT 39.5 02/20/2016 1108   PLT 250 02/20/2016 1108   MCV 88.4 02/20/2016 1108   MCH 30.0 02/20/2016 1108   MCH 30.4 02/25/2012 1400   MCHC 33.9 02/20/2016 1108   MCHC 33.8 01/21/2015 1513   RDW 13.3 02/20/2016 1108   LYMPHSABS 1.5 02/20/2016 1108   MONOABS 0.4 02/20/2016 1108   EOSABS 0.1 02/20/2016 1108   BASOSABS 0.1 02/20/2016 1108   CMP Latest Ref Rng & Units 02/20/2016 02/20/2015 01/21/2015  Glucose 70 - 140 mg/dl 206(H) 157(H) 119(H)  BUN 7.0 - 26.0 mg/dL 12.8 9.0 12  Creatinine 0.6 - 1.1 mg/dL 0.7 0.8 0.63  Sodium 136 - 145 mEq/L 137 135(L) 132(L)  Potassium 3.5 - 5.1 mEq/L 4.1 4.1 4.0  Chloride 96 - 112 mEq/L - - 96  CO2 22 - 29 mEq/L 26 25 30   Calcium 8.4 - 10.4 mg/dL 9.8 9.8 10.1  Total Protein 6.4 - 8.3 g/dL 7.7 7.6 7.9  Total Bilirubin 0.20 - 1.20 mg/dL 0.35 0.89 0.5  Alkaline Phos 40 - 150 U/L 67 82 66  AST 5 - 34 U/L 43(H) 44(H) 65(H)  ALT 0 - 55 U/L 47 52 57(H)   *Labs reviewed with patient in detail; results largely stable from previous.   DIAGNOSTIC IMAGING:  Most recent mammogram: 12/31/14 EXAM: DIGITAL DIAGNOSTIC BILATERAL MAMMOGRAM WITH 3D TOMOSYNTHESIS AND CAD  COMPARISON:  Previous exam(s).  ACR Breast Density Category b: There are scattered areas of fibroglandular density.  FINDINGS: Mild architectural distortion on the left is stable reflecting postsurgical scarring. There are no discrete masses or other areas of architectural distortion. There are no suspicious calcifications. No mammographic change.  Mammographic images were processed with CAD.  IMPRESSION: No evidence of recurrent or new breast malignancy. Benign postsurgical changes on the left.  RECOMMENDATION: Diagnostic mammography in 1 year per standard post lumpectomy Protocol.    ASSESSMENT AND PLAN:  Ms.. [name] is a pleasant 75 y.o. female with history of Stage  IA left breast invasive ductal carcinoma, ER+/PR+/HER2-, diagnosed in 07/2011, treated with neoadjuvant Femara in 08/2011, then lumpectomy and adjuvant radiation therapy. She remains on Femara currently.  She presents to the Survivorship Clinic for surveillance and routine follow-up.   1. History of Stage IA left breast cancer:  [name] is currently clinically without evidence of  disease or recurrence of breast cancer. She does have breast tenderness today; we discussed that chronic mastalgia is common in women who have undergone lumpectomy and subsequent radiation therapy for breast cancer.  There were no worrisome findings on physical exam today.  She has not had her annual mammogram for 2017; orders placed today.  She will follow-up with her medical oncologist, Dr. Jana Hakim, in 08/2016. This will be her 5-year "graduation" visit at which point she can be discharged from follow-up at the cancer center if she chooses.  We did discuss the opportunity for her to continue to be seen annually in the Survivorship Clinic, if she would prefer.  She is going to think about this and discuss with Dr. Jana Hakim at her visit in 08/2016.    2. Mild transaminitis, chronic: Her AST remains mildly elevated in the 40s.  We discussed that liver enzymes can be elevated for a variety of reasons including liver disease, medications, etc.  The AST has been mildly elevated chronically. Certainly her diabetes and possible fatty liver disease could be contributing as well.  I will defer to her PCP to continue to manage  3. Diabetes mellitus: Her serum glucose today was 206; she reports that she had been fasting before her labs were collected today. She is currently taking Metformin and following up closely with her PCP.  A hemoglobin A1c was collected today, as requested by [name] (pt's PCP); results to be routed to [name] when they are received.   4. Bone health:  Given [name]'s age, history of breast cancer, and her  current treatment regimen including chemoprevention with letrozole, she is at risk for bone demineralization.  Her last DEXA scan was on 12/24/13 and showed osteoporosis.  Therefore she is due for her biennial bone mineral density assessment.  I will place orders today to have DEXA scan done at the same time as her mammogram.  She was encouraged to increase her weight-bearing activities.  She was given education on specific food and activities to promote bone health.  5. Health maintenance and wellness promotion: [name] was encouraged to consume 5-7 servings of fruits and vegetables per day. She was also encouraged to engage in moderate to vigorous exercise for 30 minutes per day most days of the week.  We discussed the [facility] and she is going to consider participating. One of the barriers to this is that she lives in [place] and there is not a [employer] facility that participates in [place] close to her home.  We did discuss that the commute for this program may be worth her time, as she could come to the classes and then do the workouts she learns at her own [employer] closer to home.  We discussed that increasing her physical activity may also help with her anxiety and depression.  She will think about this and was given a brochure with instructions on how to get enrolled in [employer] if she chooses to participate.  She was instructed to limit her alcohol consumption and continue to abstain from tobacco use.    Dispo:  -Due for annual mammogram now; orders placed today to get mammogram as soon as able.  -Due for biennial DEXA scan; orders placed today and will try to coordinate with mammogram.   -She will complete 5 years of Femara in 08/2016.  -Return to cancer center to see Dr. Jana Hakim in 08/2016 for her 5-year "graduation" visit.  -I would be happy to see her long-term in the  Survivorship Clinic, if she chooses to continue to have follow-up at the cancer center.  If not, then her PCP can  manage her breast exams/mammograms long-term.     A total of 30 minutes of face-to-face time was spent with this patient with greater than 50% of that time in counseling and care-coordination.   Mike Craze, NP Survivorship Program [place] [phone]   Note: PRIMARY CARE PROVIDER [name], [place] [phone]

## 2016-02-20 NOTE — Telephone Encounter (Signed)
appt made and avs printed °

## 2016-02-21 ENCOUNTER — Encounter: Payer: Self-pay | Admitting: Adult Health

## 2016-02-21 LAB — HEMOGLOBIN A1C
Est. average glucose Bld gHb Est-mCnc: 186 mg/dL
HEMOGLOBIN A1C: 8.1 % — AB (ref 4.8–5.6)

## 2016-03-01 ENCOUNTER — Ambulatory Visit
Admission: RE | Admit: 2016-03-01 | Discharge: 2016-03-01 | Disposition: A | Discharge status: Home / Self Care | Payer: Medicare Other | Source: Ambulatory Visit | Attending: Adult Health | Admitting: Adult Health

## 2016-03-01 DIAGNOSIS — Z780 Asymptomatic menopausal state: Secondary | ICD-10-CM

## 2016-03-01 DIAGNOSIS — Z853 Personal history of malignant neoplasm of breast: Secondary | ICD-10-CM

## 2016-03-01 DIAGNOSIS — M810 Age-related osteoporosis without current pathological fracture: Secondary | ICD-10-CM | POA: Diagnosis not present

## 2016-03-01 DIAGNOSIS — R928 Other abnormal and inconclusive findings on diagnostic imaging of breast: Secondary | ICD-10-CM | POA: Diagnosis not present

## 2016-03-01 DIAGNOSIS — Z79811 Long term (current) use of aromatase inhibitors: Secondary | ICD-10-CM

## 2016-03-08 ENCOUNTER — Telehealth: Payer: Self-pay | Admitting: Adult Health

## 2016-03-08 NOTE — Telephone Encounter (Signed)
I spoke with Ms. Conigliaro about her recent DEXA scan results, which showed osteoporosis (she has a history of osteoporosis).  T-score -4.4 on this exam.   She is allergic to bisphonates. Encouraged her to continue her calcium and vitamin D supplementation and weight-bearing exercises as tolerated.  She voiced understanding of these results. Encouraged her to call with other questions or concerns.   Mike Craze, NP [place] [phone]

## 2016-03-29 DIAGNOSIS — N3001 Acute cystitis with hematuria: Secondary | ICD-10-CM | POA: Diagnosis not present

## 2016-03-29 DIAGNOSIS — R300 Dysuria: Secondary | ICD-10-CM | POA: Diagnosis not present

## 2016-04-03 ENCOUNTER — Telehealth: Payer: Self-pay | Admitting: Family Medicine

## 2016-04-05 NOTE — Telephone Encounter (Signed)
Received refill electronically Last refill 01/06/16 #150/2 Last office visit 11/07/15 No upcoming appointment scheduled.

## 2016-04-05 NOTE — Telephone Encounter (Signed)
Pt left /vm requesting cb with status of refills requested from pharmacy.

## 2016-04-06 NOTE — Telephone Encounter (Signed)
Spoke with patient and advised that medication is being phoned in currently.  Medication phoned to pharmacy.

## 2016-04-06 NOTE — Telephone Encounter (Signed)
Please call in lorazepam.  Thanks.   

## 2016-04-06 NOTE — Telephone Encounter (Signed)
Please call patient back about refills. Patient can be reached at [phone].

## 2016-04-07 ENCOUNTER — Other Ambulatory Visit: Payer: Self-pay | Admitting: Obstetrics & Gynecology

## 2016-04-07 ENCOUNTER — Telehealth: Payer: Self-pay | Admitting: Internal Medicine

## 2016-04-07 NOTE — Telephone Encounter (Signed)
Reviewed colon and path report with Dr. Deatra Ina from 2015. Dr. 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 He states he palpated something on exam and the vaginal discharge appears to be from a fistula. Requested pt be seen. Pt scheduled to see Dr. Henrene Pastor tomorrow 04/08/16@10 :45am. Pt aware of appt. Dr. Deatra Ina to fax office note.

## 2016-04-08 ENCOUNTER — Ambulatory Visit (INDEPENDENT_AMBULATORY_CARE_PROVIDER_SITE_OTHER): Payer: Medicare Other | Admitting: Internal Medicine

## 2016-04-08 ENCOUNTER — Encounter: Payer: Self-pay | Admitting: Internal Medicine

## 2016-04-08 ENCOUNTER — Other Ambulatory Visit (INDEPENDENT_AMBULATORY_CARE_PROVIDER_SITE_OTHER): Payer: Medicare Other

## 2016-04-08 VITALS — BP 156/70 | HR 72 | Ht 60.63 in | Wt 136.2 lb

## 2016-04-08 DIAGNOSIS — N823 Fistula of vagina to large intestine: Secondary | ICD-10-CM

## 2016-04-08 DIAGNOSIS — R1904 Left lower quadrant abdominal swelling, mass and lump: Secondary | ICD-10-CM | POA: Diagnosis not present

## 2016-04-08 DIAGNOSIS — R1032 Left lower quadrant pain: Secondary | ICD-10-CM

## 2016-04-08 LAB — BASIC METABOLIC PANEL
BUN: 13 mg/dL (ref 6–23)
CHLORIDE: 99 meq/L (ref 96–112)
CO2: 28 meq/L (ref 19–32)
CREATININE: 0.61 mg/dL (ref 0.40–1.20)
Calcium: 10.2 mg/dL (ref 8.4–10.5)
GFR: 101.05 mL/min (ref >60.00)
Glucose, Bld: 197 mg/dL — ABNORMAL HIGH (ref 70–99)
POTASSIUM: 4 meq/L (ref 3.5–5.1)
SODIUM: 137 meq/L (ref 135–145)

## 2016-04-08 NOTE — Progress Notes (Signed)
HISTORY OF PRESENT ILLNESS:  [name] is a 75 y.o. female with past medical history as listed below who is kindly referred by Dr. Alden Hipp regarding probable rectovaginal fistula. I saw the patient on 1 occasion February 2015 upon referral regarding rectal bleeding. She has a history of diverticulosis and diverticulitis. She subsequently underwent complete colonoscopy that same month. She was found to have severe diverticulosis throughout the colon. A diminutive polyp on the ileocecal valve was removed and found to be non-adenomatous. Internal hemorrhoids present. Follow-up as needed recommended. Patient's current history is that of new onset vaginal discharge which began a few weeks ago. Initially mild and watery. Subsequently more voluminous and brown to where she requires wearing a pad for protection. She was evaluated at urgent care and diagnosed with urinary tract infection. Antibiotics given. Follow-up with her gynecologist recommended. She saw Dr. Deatra Ina yesterday. I have reviewed his note. Physical exam revealed brownish vaginal discharge and tender mass effect in the midline at the vaginal cuff. She is now referred. Patient denies having problems with abdominal pain in recent months. She does report a 5 pound weight loss. No rectal bleeding. No fevers. She is accompanied today by her sister-in-law. she does take metformin for diabetes.  REVIEW OF SYSTEMS:  All non-GI ROS negative except foranxiety, back pain, depression, fatigue, urinary leakage, vaginal leakage  Past Medical History:  Diagnosis Date  . Anemia   . Anxiety   . Arthritis    osteoporosis  . Blood transfusion   . Blood transfusion without reported diagnosis 1983   prbc  . Breast cancer ([place]) 03/01/12   left breast lumpectomy=invasive ductal ca,grade I/III,INVILVES SKELATAL MUSCLE   er=+,pr=NEG. HER2 NEG.  . Cancer ([place]) 08/09/11 DX    LEFT  . Depression   . Diabetes mellitus 2011   type II, diet controlled  .  Diverticulosis   . GERD (gastroesophageal reflux disease)   . History of radiation therapy    RIGHT JAW 5 TXS FOR ABSESSED TOOTH IN HER 30'S   . History of radiation therapy 04/10/12-05/26/12   left breast  . Hyperlipidemia   . Hypertension   . Insomnia, unspecified   . Osteoporosis, unspecified    with prev fosamax treatment  . Other abnormal glucose     Past Surgical History:  Procedure Laterality Date  . ABDOMINAL HYSTERECTOMY  1983   b/ls alpingooph orectomy 4 years later  . BILATERAL OOPHORECTOMY  1987  . BREAST LUMPECTOMY  03/01/12   LEFT=INVASIVE DUCTAL CA,gRADE i/iii,INVASION AT PSTERIOR RESECTION MARGIN AND INVLOVES SKE;ATAL MUSCLE,PERINEURAL INVASION IDENTIFIED  . INGUINAL HERNIA REPAIR Left 1999   LIH  . ORIF FEMUR FRACTURE Right    titanium rod  . SIGMOIDOSCOPY    . [place]    Social History AROURA VASUDEVAN  reports that she has never smoked. She has never used smokeless tobacco. She reports that she does not drink alcohol or use drugs.  family history includes Alcohol abuse in her father; Bladder Cancer in her brother and son; Diabetes in her mother, sister, and sister.  Allergies  Allergen Reactions  . Bisphosphonates     Unclear relation to meds, but pt had long bone fracture after treatment with fosamax  . Diclofenac Potassium     Not sure  . Ezetimibe-Simvastatin     myalgias  . Naproxen Sodium     Not sure  . Simvastatin     myalgias  . Sulfonamide Derivatives   . Vicodin [Hydrocodone-Acetaminophen] Nausea  Only       PHYSICAL EXAMINATION: Vital signs: BP (!) 156/70 (BP Location: Right Arm, Patient Position: Sitting, Cuff Size: Normal)   Pulse 72   Ht 5' 0.63" (1.54 m)   Wt 136 lb 4 oz (61.8 kg)   BMI 26.06 kg/m   Constitutional: anxious,generally well-appearing, no acute distress Psychiatric: alert and oriented x3, cooperative Eyes: extraocular movements intact, anicteric, conjunctiva pink Mouth: oral pharynx moist, no  lesions Neck: supple no lymphadenopathy Cardiovascular: heart regular rate and rhythm, no murmur Lungs: clear to auscultation bilaterally Abdomen: soft, tenderness in left lower quadrant with slight mass effect or fullness, nondistended, no obvious ascites, no peritoneal signs, normal bowel sounds, no organomegaly. Prior surgical incisions well-healed Rectal:deferred. Extremities: no clubbing cyanosis or lower extremity edema bilaterally Skin: no lesions on visible extremities Neuro: No focal deficits. Creon nurse intact  ASSESSMENT:  #1. Rectovaginal fistula. Diagnosed on vaginal exam. Not sure if this is anovaginal, true rectovaginal, or colovaginal #2. Left lower quadrant tenderness with mass effect. Rule out diverticular abscess #3. History diverticulosis and diverticulitis  PLAN:  #1. Contrast-enhanced CT scan of the abdomen and pelvis to rule out abscess #2. If negative, relook colonoscopy to try to identify specific type of fistulous process and rule out other problems, though unlikely given previous colonoscopy to have years ago #3. Further plans (surgery) thereafter be determined.  A copy of this consultation note has been sent to Dr. Deatra Ina

## 2016-04-08 NOTE — Patient Instructions (Addendum)
Your physician has requested that you go to the basement for the following lab work before leaving today:  BMET  You have been scheduled for a CT scan of the abdomen and pelvis at [facility] ([address]---this is in the same building as [facility]).   You are scheduled on 04/15/2016 at 10:00am. You should arrive 15 minutes prior to your appointment time for registration. Please follow the written instructions below on the day of your exam:  WARNING: IF YOU ARE ALLERGIC TO IODINE/X-RAY DYE, PLEASE NOTIFY RADIOLOGY IMMEDIATELY AT [phone]! YOU WILL BE GIVEN A 13 HOUR PREMEDICATION PREP.  1) Do not eat or drink anything after 6:00am (4 hours prior to your test) 2) You have been given 2 bottles of oral contrast to drink. The solution may taste               better if refrigerated, but do NOT add ice or any other liquid to this solution. Shake             well before drinking.    Drink 1 bottle of contrast @ 8:00am (2 hours prior to your exam)  Drink 1 bottle of contrast @ 9:00am (1 hour prior to your exam)  You may take any medications as prescribed with a small amount of water except for the following: Metformin, Glucophage, Glucovance, Avandamet, Riomet, Fortamet, Actoplus Met, Janumet, Glumetza or Metaglip. The above medications must be held the day of the exam AND 48 hours after the exam.  The purpose of you drinking the oral contrast is to aid in the visualization of your intestinal tract. The contrast solution may cause some diarrhea. Before your exam is started, you will be given a small amount of fluid to drink. Depending on your individual set of symptoms, you may also receive an intravenous injection of x-ray contrast/dye. Plan on being at [facility] for 30 minutes or long, depending on the type of exam you are having performed.  If you have any questions regarding your exam or if you need to reschedule, you may call the CT department at [phone]  between the hours of 8:00 am and 5:00 pm, Monday-Friday.  ________________________________________________________________________   

## 2016-04-09 LAB — CYTOLOGY - PAP

## 2016-04-10 ENCOUNTER — Other Ambulatory Visit: Payer: Self-pay | Admitting: Family Medicine

## 2016-04-14 ENCOUNTER — Telehealth: Payer: Self-pay | Admitting: Internal Medicine

## 2016-04-14 NOTE — Telephone Encounter (Signed)
Left message for pt to call back.  Discussed with pt that she does not have an allergy listed to iodine. Pt was afraid she could not get to CT on time due to drinking second bottle of contrast. Discussed with her that she could drink the second bottle in the car on the way to have ct done. Pt verbalized understanding.

## 2016-04-15 ENCOUNTER — Ambulatory Visit (INDEPENDENT_AMBULATORY_CARE_PROVIDER_SITE_OTHER)
Admission: RE | Admit: 2016-04-15 | Discharge: 2016-04-15 | Disposition: A | Discharge status: Home / Self Care | Payer: Medicare Other | Source: Ambulatory Visit | Attending: Internal Medicine | Admitting: Internal Medicine

## 2016-04-15 DIAGNOSIS — R1032 Left lower quadrant pain: Secondary | ICD-10-CM | POA: Diagnosis not present

## 2016-04-15 DIAGNOSIS — R1904 Left lower quadrant abdominal swelling, mass and lump: Secondary | ICD-10-CM | POA: Diagnosis not present

## 2016-04-15 DIAGNOSIS — K5730 Diverticulosis of large intestine without perforation or abscess without bleeding: Secondary | ICD-10-CM | POA: Diagnosis not present

## 2016-04-15 DIAGNOSIS — N823 Fistula of vagina to large intestine: Secondary | ICD-10-CM | POA: Diagnosis not present

## 2016-04-15 MED ORDER — IOPAMIDOL (ISOVUE-300) INJECTION 61%
100.0000 mL | Freq: Once | INTRAVENOUS | Status: AC | PRN
  Administered 2016-04-15: 100 mL via INTRAVENOUS

## 2016-04-27 ENCOUNTER — Ambulatory Visit: Payer: Self-pay | Admitting: Surgery

## 2016-04-27 DIAGNOSIS — Z01818 Encounter for other preprocedural examination: Secondary | ICD-10-CM | POA: Diagnosis not present

## 2016-04-27 DIAGNOSIS — N824 Other female intestinal-genital tract fistulae: Secondary | ICD-10-CM | POA: Diagnosis not present

## 2016-04-27 NOTE — H&P (Signed)
[name] 04/27/2016 10:22 AM Location: [facility] Patient #: [id number] DOB: [birth year] Widowed / Language: Cleophus Molt / Race: White Female  Patient Care Team: Tonia Ghent, MD as PCP - General Michael Boston, MD as Consulting Physician (General Surgery) Alden Hipp, MD as Consulting Physician (Obstetrics and Gynecology) Irene Shipper, MD as Consulting Physician (Gastroenterology)   History of Present Illness [name] MD; 04/27/2016 11:28 AM) The patient is a 75 year old female who presents with colovaginal fistula. Note for "Colovaginal fistula": Patient sent for surgical consultation at the request of her gastroenterologist, Dr. Scarlette Shorts with [facility] gastroenterology. Concern for colovaginal fistula.  Pleasant 75 year old female. Had prior GYN surgeries for tubal ligation, salpingo-oophorectomy, hysterectomy. Many years ago. Left inguinal hernia repair by Dr. Deon Pilling over a decade ago. Patient has had some issues with constipation and diverticulitis in the distant past. She recalls an episode of tripping the hospital 1993. She thinks she's had a few episodes of pain and discomfort suspicious for flares. Usually has gotten better on their own without needing oral antibiotics. She had an episode of rectal bleeding and underwent colonoscopy 2015 which revealed benign polyp and diverticulosis. No problems since. No melena. Can walk a half hour without difficulty. Had surgery for early stage left breast cancer by Dr. Donne Hazel with our group in 2013.   However she started noting some vaginal drainage about a month ago. Became more foul. Irritating. Concerned. Urinalysis was vaguely dirty but not classic for urinary tract infection. Discussed with her gastroenterologist. CT scan done. Gas bubbles between sigmoid colon and vagina strongly suspicious for colovaginal fistula. Surgical consultation requested.  Patient denies much in way of constipation  although occasionally has some hard stool that Metamucil helps with. She's been pretty miserable with the vaginal drainage. Burning and irritating. Had a dirty urinalysis of the gynecological office but not much problems now. She can walk an hour without difficulty. No cardiopulmonary issues. She does not smoke. She does have diabetes on metformin. Never required insulin. Last A1c was in the high eights no. Sugars occasionally get up to 200 but for the most part they're in the mid 100s.   Allergies Nance Pear, [place]; 04/27/2016 10:23 AM) Sulfa Antibiotics Vicodin *ANALGESICS - OPIOID* Simvastatin *ANTIHYPERLIPIDEMICS* Diclofenac Sodium *DERMATOLOGICALS* Flexeril *MUSCULOSKELETAL THERAPY AGENTS* Naproxen *ANALGESICS - ANTI-INFLAMMATORY*  Medication History Nance Pear, CMA; 04/27/2016 10:24 AM) MetFORMIN HCl (500MG  Tablet, Oral two times daily) Active. LORazepam (2MG  Tablet, Oral twice a day) Active. AmLODIPine Besylate (5MG  Tablet, Oral daily) Active. BuPROPion HCl ER (SR) (100MG  Tablet ER 12HR, Oral daily) Active. Metoprolol Tartrate (50MG  Tablet, Oral daily) Active. Omeprazole (20MG  Capsule DR, Oral daily) Active. Sertraline HCl (100MG  Tablet, Oral daily) Active. Letrozole (2.5MG  Tablet, Oral daily) Active. Medications Reconciled    Vitals Bary Castilla Bradford CMA; 04/27/2016 10:25 AM) 04/27/2016 10:25 AM Weight: 135.8 lb Height: 62in Body Surface Area: 1.62 m Body Mass Index: 24.84 kg/m  Temp.: 98.82F  Pulse: 67 (Regular)  BP: 132/78 (Sitting, Left Arm, Standard)      Physical Exam [name] MD; 04/27/2016 10:46 AM)  General Mental Status-Alert. General Appearance-Not in acute distress, Not Sickly. Orientation-Oriented X3. Hydration-Well hydrated. Voice-Normal.  Integumentary Global Assessment Upon inspection and palpation of skin surfaces of the - Axillae: non-tender, no inflammation or ulceration, no drainage.  and Distribution of scalp and body hair is normal. General Characteristics Temperature - normal warmth is noted.  Head and Neck Head-normocephalic, atraumatic with no lesions or palpable masses. Face Global Assessment -  atraumatic, no absence of expression. Neck Global Assessment - no abnormal movements, no bruit auscultated on the right, no bruit auscultated on the left, no decreased range of motion, non-tender. Trachea-midline. Thyroid Gland Characteristics - non-tender.  Eye Eyeball - Left-Extraocular movements intact, No Nystagmus. Eyeball - Right-Extraocular movements intact, No Nystagmus. Cornea - Left-No Hazy. Cornea - Right-No Hazy. Sclera/Conjunctiva - Left-No scleral icterus, No Discharge. Sclera/Conjunctiva - Right-No scleral icterus, No Discharge. Pupil - Left-Direct reaction to light normal. Pupil - Right-Direct reaction to light normal.  ENMT Ears Pinna - Left - no drainage observed, no generalized tenderness observed. Right - no drainage observed, no generalized tenderness observed. Nose and Sinuses External Inspection of the Nose - no destructive lesion observed. Inspection of the nares - Left - quiet respiration. Right - quiet respiration. Mouth and Throat Lips - Upper Lip - no fissures observed, no pallor noted. Lower Lip - no fissures observed, no pallor noted. Nasopharynx - no discharge present. Oral Cavity/Oropharynx - Tongue - no dryness observed. Oral Mucosa - no cyanosis observed. Hypopharynx - no evidence of airway distress observed.  Chest and Lung Exam Inspection Movements - Normal and Symmetrical. Accessory muscles - No use of accessory muscles in breathing. Palpation Palpation of the chest reveals - Non-tender. Auscultation Breath sounds - Normal and Clear.  Cardiovascular Auscultation Rhythm - Regular. Murmurs & Other Heart Sounds - Auscultation of the heart reveals - No Murmurs and No Systolic  Clicks.  Abdomen Inspection Inspection of the abdomen reveals - No Visible peristalsis and No Abnormal pulsations. Umbilicus - No Bleeding, No Urine drainage. Palpation/Percussion Palpation and Percussion of the abdomen reveal - Soft, Non Tender, No Rebound tenderness, No Rigidity (guarding) and No Cutaneous hyperesthesia. Note: Abdomen soft. Nontender, nondistended. No guarding. No umbilical no other hernias. No diastases recti.  Female Genitourinary Sexual Maturity Tanner 5 - Adult hair pattern. Note: At proximal apex of the vagina, small opening felt. Some irritation. Tenderness. No frank purulence. No vaginal mass felt. Moderate cystocele. Thin rectovaginal septum but no major rectocele. No distal rectovaginal fistula.  Exam done with assistance of female Medical Assistant in the room. No inguinal lymphadenopathy. No inguinal hernias. No frank feculent drainage. No frank bleeding. Mildly irritated urethra. Held off on speculum examination.  Rectal Note: Exam done with assistance of female Medical Assistant in the room. Perianal skin clean with good hygiene. No pruritis ani. No pilonidal disease. No fissure. No abscess/fistula. Normal sphincter tone. Tolerates digital rectal exam. No rectal masses felt to 7 cm. Mild right anterior internal/external hemorrhoid. No other external tags or hemorrhoids. Did not do any anoscopy given soft stool felt in rectal vault.  Peripheral Vascular Upper Extremity Inspection - Left - No Cyanotic nailbeds, Not Ischemic. Right - No Cyanotic nailbeds, Not Ischemic.  Neurologic Neurologic evaluation reveals -normal attention span and ability to concentrate, able to name objects and repeat phrases. Appropriate fund of knowledge , normal sensation and normal coordination. Mental Status Affect - not angry, not paranoid. Cranial Nerves-Normal Bilaterally. Gait-Normal.  Neuropsychiatric Mental status exam performed with findings of-able to  articulate well with normal speech/language, rate, volume and coherence, thought content normal with ability to perform basic computations and apply abstract reasoning and no evidence of hallucinations, delusions, obsessions or homicidal/suicidal ideation.  Musculoskeletal Global Assessment Spine, Ribs and Pelvis - no instability, subluxation or laxity. Right Upper Extremity - no instability, subluxation or laxity.  Lymphatic Head & Neck  General Head & Neck Lymphatics: Bilateral - Description - No Localized lymphadenopathy. Axillary  General Axillary Region: Bilateral - Description - No Localized lymphadenopathy. Femoral & Inguinal  Generalized Femoral & Inguinal Lymphatics: Left - Description - No Localized lymphadenopathy. Right - Description - No Localized lymphadenopathy.    Assessment & Plan [name] MD; 04/27/2016 11:11 AM)  COLOVAGINAL FISTULA (N82.4) Impression: History CT scan and physical exam suspicious for colovaginal fistula to the apex of the vagina.  She has no active abscess right now.  This will not resolve without surgery. Reasonable minimally invasive approach with rectosigmoid resection and vaginal repair. Try robotic/minimally invasive approach. She initially seemed hesitant about having to do surgery but then wanted to get it done as soon as possible because she is pretty miserable with this drainage. Her sister-in-law agrees.  PREOP COLON - ENCOUNTER FOR PREOPERATIVE EXAMINATION FOR GENERAL SURGICAL PROCEDURE (Z01.818)  Current Plans You are being scheduled for surgery - Our schedulers will call you.  You should hear from our office's scheduling department within 5 working days about the location, date, and time of surgery. We try to make accommodations for patient's preferences in scheduling surgery, but sometimes the OR schedule or the surgeon's schedule prevents [place] from making those accommodations.  If you have not heard from our office  [phone]) in 5 working days, call the office and ask for your surgeon's nurse.  If you have other questions about your diagnosis, plan, or surgery, call the office and ask for your surgeon's nurse.  Written instructions provided Pt Education - [facility] Colon Bowel Prep 2015 Miralax/Antibiotics Started Neomycin Sulfate 500MG , 2 (two) Tablet SEE NOTE, #6, 04/27/2016, No Refill. Local Order: TAKE TWO TABLETS AT 2 PM, 3 PM, AND 10 PM THE DAY PRIOR TO SURGERY Started Flagyl 500MG , 2 (two) Tablet SEE NOTE, #6, 04/27/2016, No Refill. Local Order: Take at 2pm, 3pm, and 10pm the day prior to your colon operation Pt Education - [facility] Colectomy post-op instructions: discussed with patient and provided information. Pt Education - [facility] Good Bowel Health (Ceclia Koker) Pt Education - [facility] Pain Control (Simar Pothier) Pt Education - Pamphlet Given - Laparoscopic Colorectal Surgery: discussed with patient and provided information.  [name], [name], [name] Gastrointestinal and Minimally Invasive Surgery [facility] [address] [place], [place] [address] [phone] Main / Paging

## 2016-04-29 ENCOUNTER — Other Ambulatory Visit: Payer: Self-pay | Admitting: Family Medicine

## 2016-05-05 ENCOUNTER — Encounter: Payer: Self-pay | Admitting: Family Medicine

## 2016-05-11 ENCOUNTER — Encounter: Payer: Self-pay | Admitting: Family Medicine

## 2016-05-11 ENCOUNTER — Telehealth: Payer: Self-pay

## 2016-05-11 NOTE — Patient Instructions (Addendum)
[name]  05/11/2016   Your procedure is scheduled on: 05/14/2016    Report to [facility]  Entrance take [place]  elevators to 3rd floor to  [place] at   0830  AM.  Call this number if you have problems the morning of surgery [phone]   Remember: ONLY 1 PERSON MAY GO WITH YOU TO SHORT STAY TO GET  READY MORNING OF [place].  Do not eat food or drink liquids :After Midnight.  Drink plenty of clear liquids on day of bowel prep.     Take these medicines the morning of surgery with A SIP OF WATER:  Amlodipine ( NOrvasc), Wellbutrin, Ativan if needed, Metoprolol ( Lopressor), Omeprazole ( Prilosec), zoloft  DO NOT TAKE ANY DIABETIC MEDICATIONS DAY OF YOUR SURGERY                               You may not have any metal on your body including hair pins and              piercings  Do not wear jewelry, make-up, lotions, powders or perfumes, deodorant             Do not wear nail polish.  Do not shave  48 hours prior to surgery.              Men may shave face and neck.   Do not bring valuables to the hospital. [place].  Contacts, dentures or bridgework may not be worn into surgery.  Leave suitcase in the car. After surgery it may be brought to your room.         Special Instructions: coughing and deep breathing exercises, leg exercises               Please read over the following fact sheets you were given: _____________________________________________________________________             [facility] - Preparing for Surgery Before surgery, you can play an important role.  Because skin is not sterile, your skin needs to be as free of germs as possible.  You can reduce the number of germs on your skin by washing with CHG (chlorahexidine gluconate) soap before surgery.  CHG is an antiseptic cleaner which kills germs and bonds with the skin to continue killing germs even after  washing. Please DO NOT use if you have an allergy to CHG or antibacterial soaps.  If your skin becomes reddened/irritated stop using the CHG and inform your nurse when you arrive at Short Stay. Do not shave (including legs and underarms) for at least 48 hours prior to the first CHG shower.  You may shave your face/neck. Please follow these instructions carefully:  1.  Shower with CHG Soap the night before surgery and the  morning of Surgery.  2.  If you choose to wash your hair, wash your hair first as usual with your  normal  shampoo.  3.  After you shampoo, rinse your hair and body thoroughly to remove the  shampoo.                           4.  Use CHG as  you would any other liquid soap.  You can apply chg directly  to the skin and wash                       Gently with a scrungie or clean washcloth.  5.  Apply the CHG Soap to your body ONLY FROM THE NECK DOWN.   Do not use on face/ open                           Wound or open sores. Avoid contact with eyes, ears mouth and genitals (private parts).                       Wash face,  Genitals (private parts) with your normal soap.             6.  Wash thoroughly, paying special attention to the area where your surgery  will be performed.  7.  Thoroughly rinse your body with warm water from the neck down.  8.  DO NOT shower/wash with your normal soap after using and rinsing off  the CHG Soap.                9.  Pat yourself dry with a clean towel.            10.  Wear clean pajamas.            11.  Place clean sheets on your bed the night of your first shower and do not  sleep with pets. Day of Surgery : Do not apply any lotions/deodorants the morning of surgery.  Please wear clean clothes to the hospital/surgery center.  FAILURE TO FOLLOW THESE INSTRUCTIONS MAY RESULT IN THE CANCELLATION OF YOUR SURGERY PATIENT SIGNATURE_________________________________  NURSE  SIGNATURE__________________________________  ________________________________________________________________________    CLEAR LIQUID DIET   Foods Allowed                                                                     Foods Excluded  Coffee and tea, regular and decaf                             liquids that you cannot  Plain Jell-O in any flavor                                             see through such as: Fruit ices (not with fruit pulp)                                     milk, soups, orange juice  Iced Popsicles                                    All solid food Carbonated beverages, regular and diet  Cranberry, grape and apple juices Sports drinks like Gatorade Lightly seasoned clear broth or consume(fat free) Sugar, honey syrup  Sample Menu Breakfast                                Lunch                                     Supper Cranberry juice                    Beef broth                            Chicken broth Jell-O                                     Grape juice                           Apple juice Coffee or tea                        Jell-O                                      Popsicle                                                Coffee or tea                        Coffee or tea  _____________________________________________________________________  How to Manage Your Diabetes Before and After Surgery  Why is it important to control my blood sugar before and after surgery? . Improving blood sugar levels before and after surgery helps healing and can limit problems. . A way of improving blood sugar control is eating a healthy diet by: o  Eating less sugar and carbohydrates o  Increasing activity/exercise o  Talking with your doctor about reaching your blood sugar goals . High blood sugars (greater than 180 mg/dL) can raise your risk of infections and slow your recovery, so you will need to focus on controlling your  diabetes during the weeks before surgery. . Make sure that the doctor who takes care of your diabetes knows about your planned surgery including the date and location.  How do I manage my blood sugar before surgery? . Check your blood sugar at least 4 times a day, starting 2 days before surgery, to make sure that the level is not too high or low. o Check your blood sugar the morning of your surgery when you wake up and every 2 hours until you get to the Short Stay unit. . If your blood sugar is less than 70 mg/dL, you will need to treat for low blood sugar: o Do not take insulin. o Treat a low blood sugar (less than 70 mg/dL) with  cup of clear juice (cranberry or apple), 4 glucose tablets, OR glucose gel. o  Recheck blood sugar in 15 minutes after treatment (to make sure it is greater than 70 mg/dL). If your blood sugar is not greater than 70 mg/dL on recheck, call [phone] for further instructions. . Report your blood sugar to the short stay nurse when you get to Short Stay.  . If you are admitted to the hospital after surgery: o Your blood sugar will be checked by the staff and you will probably be given insulin after surgery (instead of oral diabetes medicines) to make sure you have good blood sugar levels. o The goal for blood sugar control after surgery is 80-180 mg/dL.   WHAT DO I DO ABOUT MY DIABETES MEDICATION?  Marland Kitchen Do not take oral diabetes medicines (pills) the morning of surgery.  Patient Signature:  Date:   Nurse Signature:  Date:   Reviewed and Endorsed by [facility] Patient Education Committee, August 2015 WHAT IS A BLOOD TRANSFUSION? Blood Transfusion Information  A transfusion is the replacement of blood or some of its parts. Blood is made up of multiple cells which provide different functions.  Red blood cells carry oxygen and are used for blood loss replacement.  White blood cells fight against infection.  Platelets control bleeding.  Plasma helps clot  blood.  Other blood products are available for specialized needs, such as hemophilia or other clotting disorders. BEFORE THE TRANSFUSION  Who gives blood for transfusions?   Healthy volunteers who are fully evaluated to make sure their blood is safe. This is blood bank blood. Transfusion therapy is the safest it has ever been in the practice of medicine. Before blood is taken from a donor, a complete history is taken to make sure that person has no history of diseases nor engages in risky social behavior (examples are intravenous drug use or sexual activity with multiple partners). The donor's travel history is screened to minimize risk of transmitting infections, such as malaria. The donated blood is tested for signs of infectious diseases, such as HIV and hepatitis. The blood is then tested to be sure it is compatible with you in order to minimize the chance of a transfusion reaction. If you or a relative donates blood, this is often done in anticipation of surgery and is not appropriate for emergency situations. It takes many days to process the donated blood. RISKS AND COMPLICATIONS Although transfusion therapy is very safe and saves many lives, the main dangers of transfusion include:   Getting an infectious disease.  Developing a transfusion reaction. This is an allergic reaction to something in the blood you were given. Every precaution is taken to prevent this. The decision to have a blood transfusion has been considered carefully by your caregiver before blood is given. Blood is not given unless the benefits outweigh the risks. AFTER THE TRANSFUSION  Right after receiving a blood transfusion, you will usually feel much better and more energetic. This is especially true if your red blood cells have gotten low (anemic). The transfusion raises the level of the red blood cells which carry oxygen, and this usually causes an energy increase.  The nurse administering the transfusion will monitor  you carefully for complications. HOME CARE INSTRUCTIONS  No special instructions are needed after a transfusion. You may find your energy is better. Speak with your caregiver about any limitations on activity for underlying diseases you may have. SEEK MEDICAL CARE IF:   Your condition is not improving after your transfusion.  You develop redness or irritation at the intravenous (IV) site. SEEK  IMMEDIATE MEDICAL CARE IF:  Any of the following symptoms occur over the next 12 hours:  Shaking chills.  You have a temperature by mouth above 102 F (38.9 C), not controlled by medicine.  Chest, back, or muscle pain.  People around you feel you are not acting correctly or are confused.  Shortness of breath or difficulty breathing.  Dizziness and fainting.  You get a rash or develop hives.  You have a decrease in urine output.  Your urine turns a dark color or changes to pink, red, or brown. Any of the following symptoms occur over the next 10 days:  You have a temperature by mouth above 102 F (38.9 C), not controlled by medicine.  Shortness of breath.  Weakness after normal activity.  The white part of the eye turns yellow (jaundice).  You have a decrease in the amount of urine or are urinating less often.  Your urine turns a dark color or changes to pink, red, or brown. Document Released: 05/28/2000 Document Revised: 08/23/2011 Document Reviewed: 01/15/2008 [facility] Patient Information 2014 [place], [place].  _______________________________________________________________________

## 2016-05-11 NOTE — Telephone Encounter (Signed)
Patient calls to request if okay for her to come in and receive Flu shot and pneumonia shot prior to her surgery on Friday?    I do see in her records that she has had the Prevnar 13 on 01/21/15 and the PPV 23 on 06/15/2003.  She has not had a documented flu shot yet this season.    Please advise if okay to schedule nurse visit Wednesday or Thursday to administer here in office before colon surgery on Friday.

## 2016-05-12 ENCOUNTER — Encounter (HOSPITAL_COMMUNITY): Payer: Self-pay

## 2016-05-12 ENCOUNTER — Encounter (HOSPITAL_COMMUNITY)
Admission: RE | Admit: 2016-05-12 | Discharge: 2016-05-12 | Disposition: A | Discharge status: Home / Self Care | Payer: Medicare Other | Source: Ambulatory Visit | Attending: Surgery | Admitting: Surgery

## 2016-05-12 DIAGNOSIS — K567 Ileus, unspecified: Secondary | ICD-10-CM | POA: Diagnosis not present

## 2016-05-12 DIAGNOSIS — K660 Peritoneal adhesions (postprocedural) (postinfection): Secondary | ICD-10-CM | POA: Diagnosis not present

## 2016-05-12 DIAGNOSIS — N823 Fistula of vagina to large intestine: Secondary | ICD-10-CM | POA: Diagnosis not present

## 2016-05-12 DIAGNOSIS — E876 Hypokalemia: Secondary | ICD-10-CM | POA: Diagnosis not present

## 2016-05-12 DIAGNOSIS — E119 Type 2 diabetes mellitus without complications: Secondary | ICD-10-CM | POA: Insufficient documentation

## 2016-05-12 DIAGNOSIS — Z01818 Encounter for other preprocedural examination: Secondary | ICD-10-CM | POA: Insufficient documentation

## 2016-05-12 DIAGNOSIS — K219 Gastro-esophageal reflux disease without esophagitis: Secondary | ICD-10-CM | POA: Diagnosis not present

## 2016-05-12 DIAGNOSIS — K5732 Diverticulitis of large intestine without perforation or abscess without bleeding: Secondary | ICD-10-CM | POA: Diagnosis not present

## 2016-05-12 DIAGNOSIS — I10 Essential (primary) hypertension: Secondary | ICD-10-CM | POA: Diagnosis not present

## 2016-05-12 DIAGNOSIS — G4700 Insomnia, unspecified: Secondary | ICD-10-CM | POA: Diagnosis not present

## 2016-05-12 HISTORY — DX: Nausea with vomiting, unspecified: R11.2

## 2016-05-12 HISTORY — DX: Other specified postprocedural states: Z98.890

## 2016-05-12 HISTORY — DX: Headache: R51

## 2016-05-12 HISTORY — DX: Dyspnea, unspecified: R06.00

## 2016-05-12 HISTORY — DX: Headache, unspecified: R51.9

## 2016-05-12 LAB — BASIC METABOLIC PANEL
Anion gap: 9 (ref 5–15)
BUN: 9 mg/dL (ref 6–20)
CHLORIDE: 103 mmol/L (ref 101–111)
CO2: 24 mmol/L (ref 22–32)
CREATININE: 0.63 mg/dL (ref 0.44–1.00)
Calcium: 9.6 mg/dL (ref 8.9–10.3)
GFR calc non Af Amer: >60 mL/min (ref >60)
GLUCOSE: 186 mg/dL — AB (ref 65–99)
Potassium: 3.7 mmol/L (ref 3.5–5.1)
Sodium: 136 mmol/L (ref 135–145)

## 2016-05-12 LAB — CBC
HCT: 39.4 % (ref 36.0–46.0)
Hemoglobin: 13.3 g/dL (ref 12.0–15.0)
MCH: 29.4 pg (ref 26.0–34.0)
MCHC: 33.8 g/dL (ref 30.0–36.0)
MCV: 87 fL (ref 78.0–100.0)
PLATELETS: 251 10*3/uL (ref 150–400)
RBC: 4.53 MIL/uL (ref 3.87–5.11)
RDW: 13.4 % (ref 11.5–15.5)
WBC: 7.5 10*3/uL (ref 4.0–10.5)

## 2016-05-12 LAB — GLUCOSE, CAPILLARY: GLUCOSE-CAPILLARY: 216 mg/dL — AB (ref 65–99)

## 2016-05-12 NOTE — Telephone Encounter (Signed)
Left message on voicemail for patient to call back. 

## 2016-05-12 NOTE — Telephone Encounter (Signed)
Patient called back and stated that she does not want to get the immunizations here, but will go to [facility] and get them done.

## 2016-05-12 NOTE — Telephone Encounter (Signed)
Noted. Thanks.

## 2016-05-12 NOTE — Telephone Encounter (Signed)
Please get her set up to get a flu shot and also a pneumonia 23 vaccine. Thanks.

## 2016-05-13 LAB — HEMOGLOBIN A1C
HEMOGLOBIN A1C: 8 % — AB (ref 4.8–5.6)
Mean Plasma Glucose: 183 mg/dL

## 2016-05-13 MED ORDER — SODIUM CHLORIDE 0.9 % IV SOLN
INTRAVENOUS | Status: DC
  Filled 2016-05-13: qty 6

## 2016-05-13 NOTE — Progress Notes (Signed)
HGBA1C done 05/12/16- routed via epic to Dr Johney [place].   Final EKg done 05/12/16- EPIC

## 2016-05-14 ENCOUNTER — Encounter (HOSPITAL_COMMUNITY)
Admission: RE | Disposition: A | Discharge status: Home / Self Care | Payer: Self-pay | Source: Ambulatory Visit | Attending: Surgery

## 2016-05-14 ENCOUNTER — Inpatient Hospital Stay (HOSPITAL_COMMUNITY)
Admission: RE | Admit: 2016-05-14 | Discharge: 2016-05-18 | DRG: 330 | Disposition: A | Discharge status: Home / Self Care | Payer: Medicare Other | Source: Ambulatory Visit | Attending: Surgery | Admitting: Surgery

## 2016-05-14 ENCOUNTER — Encounter (HOSPITAL_COMMUNITY): Payer: Self-pay | Admitting: *Deleted

## 2016-05-14 ENCOUNTER — Inpatient Hospital Stay (HOSPITAL_COMMUNITY): Payer: Medicare Other | Admitting: Anesthesiology

## 2016-05-14 DIAGNOSIS — K567 Ileus, unspecified: Secondary | ICD-10-CM | POA: Diagnosis not present

## 2016-05-14 DIAGNOSIS — G4700 Insomnia, unspecified: Secondary | ICD-10-CM | POA: Diagnosis present

## 2016-05-14 DIAGNOSIS — F411 Generalized anxiety disorder: Secondary | ICD-10-CM | POA: Diagnosis not present

## 2016-05-14 DIAGNOSIS — I10 Essential (primary) hypertension: Secondary | ICD-10-CM | POA: Diagnosis present

## 2016-05-14 DIAGNOSIS — N823 Fistula of vagina to large intestine: Principal | ICD-10-CM | POA: Diagnosis present

## 2016-05-14 DIAGNOSIS — E876 Hypokalemia: Secondary | ICD-10-CM | POA: Diagnosis not present

## 2016-05-14 DIAGNOSIS — K5732 Diverticulitis of large intestine without perforation or abscess without bleeding: Secondary | ICD-10-CM | POA: Diagnosis not present

## 2016-05-14 DIAGNOSIS — E119 Type 2 diabetes mellitus without complications: Secondary | ICD-10-CM | POA: Diagnosis not present

## 2016-05-14 DIAGNOSIS — K632 Fistula of intestine: Secondary | ICD-10-CM | POA: Diagnosis not present

## 2016-05-14 DIAGNOSIS — K660 Peritoneal adhesions (postprocedural) (postinfection): Secondary | ICD-10-CM | POA: Diagnosis present

## 2016-05-14 DIAGNOSIS — K219 Gastro-esophageal reflux disease without esophagitis: Secondary | ICD-10-CM | POA: Diagnosis present

## 2016-05-14 DIAGNOSIS — N824 Other female intestinal-genital tract fistulae: Secondary | ICD-10-CM

## 2016-05-14 LAB — GLUCOSE, CAPILLARY
GLUCOSE-CAPILLARY: 210 mg/dL — AB (ref 65–99)
GLUCOSE-CAPILLARY: 236 mg/dL — AB (ref 65–99)
Glucose-Capillary: 270 mg/dL — ABNORMAL HIGH (ref 65–99)

## 2016-05-14 LAB — TYPE AND SCREEN
ABO/RH(D): O NEG
Antibody Screen: NEGATIVE

## 2016-05-14 SURGERY — COLECTOMY, PARTIAL, ROBOT-ASSISTED, LAPAROSCOPIC
Anesthesia: General | Site: Abdomen

## 2016-05-14 MED ORDER — PROMETHAZINE HCL 25 MG/ML IJ SOLN: 6.2500 mg | INTRAMUSCULAR | Status: DC | PRN

## 2016-05-14 MED ORDER — DIPHENHYDRAMINE HCL 50 MG/ML IJ SOLN: 12.5000 mg | Freq: Four times a day (QID) | INTRAMUSCULAR | Status: DC | PRN

## 2016-05-14 MED ORDER — LIP MEDEX EX OINT
1.0000 "application " | TOPICAL_OINTMENT | Freq: Two times a day (BID) | CUTANEOUS | Status: DC
  Administered 2016-05-14 – 2016-05-18 (×7): 1 via TOPICAL
  Filled 2016-05-14 (×2): qty 7

## 2016-05-14 MED ORDER — LACTATED RINGERS IV BOLUS (SEPSIS): 1000.0000 mL | Freq: Three times a day (TID) | INTRAVENOUS | Status: DC | PRN

## 2016-05-14 MED ORDER — VITAMIN D 1000 UNITS PO TABS
1000.0000 [IU] | ORAL_TABLET | Freq: Every day | ORAL | Status: DC
  Administered 2016-05-15 – 2016-05-18 (×3): 1000 [IU] via ORAL
  Filled 2016-05-14 (×4): qty 1

## 2016-05-14 MED ORDER — PHENOL 1.4 % MT LIQD
2.0000 | OROMUCOSAL | Status: DC | PRN
  Filled 2016-05-14: qty 177

## 2016-05-14 MED ORDER — BUPROPION HCL ER (SR) 100 MG PO TB12
100.0000 mg | ORAL_TABLET | Freq: Every day | ORAL | Status: DC
  Administered 2016-05-14 – 2016-05-18 (×5): 100 mg via ORAL
  Filled 2016-05-14 (×5): qty 1

## 2016-05-14 MED ORDER — VITAMIN C 500 MG PO TABS
500.0000 mg | ORAL_TABLET | Freq: Two times a day (BID) | ORAL | Status: DC
  Administered 2016-05-14 – 2016-05-18 (×6): 500 mg via ORAL
  Filled 2016-05-14 (×7): qty 1

## 2016-05-14 MED ORDER — BUPIVACAINE HCL (PF) 0.5 % IJ SOLN
INTRAMUSCULAR | Status: DC | PRN
  Administered 2016-05-14: 30 mL

## 2016-05-14 MED ORDER — MIDAZOLAM HCL 2 MG/2ML IJ SOLN
INTRAMUSCULAR | Status: AC
  Filled 2016-05-14: qty 2

## 2016-05-14 MED ORDER — FENTANYL CITRATE (PF) 250 MCG/5ML IJ SOLN
INTRAMUSCULAR | Status: AC
  Filled 2016-05-14: qty 5

## 2016-05-14 MED ORDER — ACETAMINOPHEN 500 MG PO TABS
1000.0000 mg | ORAL_TABLET | Freq: Three times a day (TID) | ORAL | Status: DC
  Administered 2016-05-14 – 2016-05-18 (×12): 1000 mg via ORAL
  Filled 2016-05-14 (×12): qty 2

## 2016-05-14 MED ORDER — METOPROLOL TARTRATE 5 MG/5ML IV SOLN
5.0000 mg | Freq: Four times a day (QID) | INTRAVENOUS | Status: DC | PRN
  Administered 2016-05-16 – 2016-05-17 (×2): 5 mg via INTRAVENOUS
  Filled 2016-05-14 (×2): qty 5

## 2016-05-14 MED ORDER — LORAZEPAM 1 MG PO TABS
1.0000 mg | ORAL_TABLET | Freq: Three times a day (TID) | ORAL | Status: DC | PRN
  Administered 2016-05-14 – 2016-05-17 (×5): 1 mg via ORAL
  Administered 2016-05-17 – 2016-05-18 (×2): 2 mg via ORAL
  Filled 2016-05-14 (×3): qty 1
  Filled 2016-05-14: qty 2
  Filled 2016-05-14 (×5): qty 1

## 2016-05-14 MED ORDER — SUGAMMADEX SODIUM 200 MG/2ML IV SOLN
INTRAVENOUS | Status: DC | PRN
  Administered 2016-05-14: 125 mg via INTRAVENOUS

## 2016-05-14 MED ORDER — METOPROLOL TARTRATE 50 MG PO TABS
50.0000 mg | ORAL_TABLET | Freq: Two times a day (BID) | ORAL | Status: DC
  Administered 2016-05-14 – 2016-05-18 (×8): 50 mg via ORAL
  Filled 2016-05-14 (×8): qty 1

## 2016-05-14 MED ORDER — GABAPENTIN 300 MG PO CAPS
300.0000 mg | ORAL_CAPSULE | ORAL | Status: AC
  Administered 2016-05-14: 300 mg via ORAL
  Filled 2016-05-14: qty 1

## 2016-05-14 MED ORDER — INSULIN ASPART 100 UNIT/ML ~~LOC~~ SOLN
0.0000 [IU] | Freq: Every day | SUBCUTANEOUS | Status: DC
  Administered 2016-05-14: 3 [IU] via SUBCUTANEOUS
  Administered 2016-05-16 – 2016-05-17 (×2): 2 [IU] via SUBCUTANEOUS

## 2016-05-14 MED ORDER — LACTATED RINGERS IR SOLN
Status: DC | PRN
  Administered 2016-05-14: 3000 mL

## 2016-05-14 MED ORDER — PROPOFOL 10 MG/ML IV BOLUS
INTRAVENOUS | Status: DC | PRN
  Administered 2016-05-14: 90 mg via INTRAVENOUS

## 2016-05-14 MED ORDER — BUPIVACAINE LIPOSOME 1.3 % IJ SUSP
INTRAMUSCULAR | Status: DC | PRN
  Administered 2016-05-14: 20 mL

## 2016-05-14 MED ORDER — ENOXAPARIN SODIUM 40 MG/0.4ML ~~LOC~~ SOLN
40.0000 mg | Freq: Once | SUBCUTANEOUS | Status: AC
  Administered 2016-05-14: 40 mg via SUBCUTANEOUS
  Filled 2016-05-14: qty 0.4

## 2016-05-14 MED ORDER — ACETAMINOPHEN 500 MG PO TABS
1000.0000 mg | ORAL_TABLET | ORAL | Status: AC
  Administered 2016-05-14: 1000 mg via ORAL
  Filled 2016-05-14: qty 2

## 2016-05-14 MED ORDER — MAGIC MOUTHWASH
15.0000 mL | Freq: Four times a day (QID) | ORAL | Status: DC | PRN
  Filled 2016-05-14: qty 15

## 2016-05-14 MED ORDER — MIDAZOLAM HCL 5 MG/5ML IJ SOLN
INTRAMUSCULAR | Status: DC | PRN
  Administered 2016-05-14: 2 mg via INTRAVENOUS

## 2016-05-14 MED ORDER — DEXTROSE 5 % IV SOLN
2.0000 g | INTRAVENOUS | Status: AC
  Administered 2016-05-14: 2 g via INTRAVENOUS
  Filled 2016-05-14: qty 2

## 2016-05-14 MED ORDER — SODIUM CHLORIDE 0.9% FLUSH: 3.0000 mL | INTRAVENOUS | Status: DC | PRN

## 2016-05-14 MED ORDER — LETROZOLE 2.5 MG PO TABS
2.5000 mg | ORAL_TABLET | Freq: Every day | ORAL | Status: DC
  Administered 2016-05-15 – 2016-05-18 (×4): 2.5 mg via ORAL
  Filled 2016-05-14 (×5): qty 1

## 2016-05-14 MED ORDER — LACTATED RINGERS IV SOLN
INTRAVENOUS | Status: DC
  Administered 2016-05-14: 1000 mL via INTRAVENOUS
  Administered 2016-05-14 (×2): via INTRAVENOUS

## 2016-05-14 MED ORDER — ONDANSETRON HCL 4 MG/2ML IJ SOLN
INTRAMUSCULAR | Status: DC | PRN
  Administered 2016-05-14: 4 mg via INTRAVENOUS

## 2016-05-14 MED ORDER — MEPERIDINE HCL 50 MG/ML IJ SOLN: 6.2500 mg | INTRAMUSCULAR | Status: DC | PRN

## 2016-05-14 MED ORDER — ALVIMOPAN 12 MG PO CAPS
12.0000 mg | ORAL_CAPSULE | Freq: Two times a day (BID) | ORAL | Status: DC
  Administered 2016-05-15: 12 mg via ORAL
  Filled 2016-05-14: qty 1

## 2016-05-14 MED ORDER — INSULIN ASPART 100 UNIT/ML ~~LOC~~ SOLN
SUBCUTANEOUS | Status: AC
  Filled 2016-05-14: qty 1

## 2016-05-14 MED ORDER — 0.9 % SODIUM CHLORIDE (POUR BTL) OPTIME
TOPICAL | Status: DC | PRN
  Administered 2016-05-14: 2000 mL

## 2016-05-14 MED ORDER — OMEGA-3-ACID ETHYL ESTERS 1 G PO CAPS
1.0000 g | ORAL_CAPSULE | Freq: Two times a day (BID) | ORAL | Status: DC
  Administered 2016-05-15 – 2016-05-18 (×5): 1 g via ORAL
  Filled 2016-05-14 (×8): qty 1

## 2016-05-14 MED ORDER — SODIUM CHLORIDE 0.9% FLUSH: 3.0000 mL | Freq: Two times a day (BID) | INTRAVENOUS | Status: DC

## 2016-05-14 MED ORDER — TRAMADOL HCL 50 MG PO TABS: 50.0000 mg | ORAL_TABLET | Freq: Four times a day (QID) | ORAL | 0 refills | Status: DC | PRN

## 2016-05-14 MED ORDER — MENTHOL 3 MG MT LOZG: 1.0000 | LOZENGE | OROMUCOSAL | Status: DC | PRN

## 2016-05-14 MED ORDER — ALUM & MAG HYDROXIDE-SIMETH 200-200-20 MG/5ML PO SUSP: 30.0000 mL | Freq: Four times a day (QID) | ORAL | Status: DC | PRN

## 2016-05-14 MED ORDER — SACCHAROMYCES BOULARDII 250 MG PO CAPS
250.0000 mg | ORAL_CAPSULE | Freq: Two times a day (BID) | ORAL | Status: DC
  Administered 2016-05-14 – 2016-05-18 (×7): 250 mg via ORAL
  Filled 2016-05-14 (×8): qty 1

## 2016-05-14 MED ORDER — EPHEDRINE SULFATE-NACL 50-0.9 MG/10ML-% IV SOSY
PREFILLED_SYRINGE | INTRAVENOUS | Status: DC | PRN
  Administered 2016-05-14: 10 mg via INTRAVENOUS

## 2016-05-14 MED ORDER — DIPHENHYDRAMINE HCL 12.5 MG/5ML PO ELIX: 12.5000 mg | ORAL_SOLUTION | Freq: Four times a day (QID) | ORAL | Status: DC | PRN

## 2016-05-14 MED ORDER — BUPIVACAINE HCL (PF) 0.5 % IJ SOLN
INTRAMUSCULAR | Status: AC
  Filled 2016-05-14: qty 30

## 2016-05-14 MED ORDER — BUPIVACAINE LIPOSOME 1.3 % IJ SUSP
20.0000 mL | INTRAMUSCULAR | Status: DC
  Filled 2016-05-14: qty 20

## 2016-05-14 MED ORDER — SERTRALINE HCL 50 MG PO TABS
150.0000 mg | ORAL_TABLET | Freq: Every day | ORAL | Status: DC
  Administered 2016-05-15 – 2016-05-18 (×4): 150 mg via ORAL
  Filled 2016-05-14 (×4): qty 3

## 2016-05-14 MED ORDER — NEOMYCIN SULFATE 500 MG PO TABS
1000.0000 mg | ORAL_TABLET | Freq: Three times a day (TID) | ORAL | Status: DC
  Filled 2016-05-14: qty 2

## 2016-05-14 MED ORDER — ROCURONIUM BROMIDE 10 MG/ML (PF) SYRINGE
PREFILLED_SYRINGE | INTRAVENOUS | Status: DC | PRN
Start: 2016-05-14 — End: 2016-05-14
  Administered 2016-05-14: 40 mg via INTRAVENOUS
  Administered 2016-05-14 (×2): 10 mg via INTRAVENOUS

## 2016-05-14 MED ORDER — LIDOCAINE 2% (20 MG/ML) 5 ML SYRINGE
INTRAMUSCULAR | Status: DC | PRN
  Administered 2016-05-14: 50 mg via INTRAVENOUS

## 2016-05-14 MED ORDER — ALVIMOPAN 12 MG PO CAPS
12.0000 mg | ORAL_CAPSULE | Freq: Once | ORAL | Status: AC
  Administered 2016-05-14: 12 mg via ORAL
  Filled 2016-05-14: qty 1

## 2016-05-14 MED ORDER — CYCLOBENZAPRINE HCL 5 MG PO TABS: 5.0000 mg | ORAL_TABLET | Freq: Two times a day (BID) | ORAL | Status: DC | PRN

## 2016-05-14 MED ORDER — ENOXAPARIN SODIUM 40 MG/0.4ML ~~LOC~~ SOLN
40.0000 mg | SUBCUTANEOUS | Status: DC
  Administered 2016-05-15 – 2016-05-18 (×4): 40 mg via SUBCUTANEOUS
  Filled 2016-05-14 (×4): qty 0.4

## 2016-05-14 MED ORDER — SODIUM CHLORIDE 0.9 % IV SOLN: 250.0000 mL | INTRAVENOUS | Status: DC | PRN

## 2016-05-14 MED ORDER — PROCHLORPERAZINE EDISYLATE 5 MG/ML IJ SOLN
10.0000 mg | Freq: Four times a day (QID) | INTRAMUSCULAR | Status: DC | PRN
  Administered 2016-05-16: 10 mg via INTRAVENOUS
  Filled 2016-05-14: qty 2

## 2016-05-14 MED ORDER — METFORMIN HCL 500 MG PO TABS
1000.0000 mg | ORAL_TABLET | Freq: Two times a day (BID) | ORAL | Status: DC
  Administered 2016-05-15 – 2016-05-18 (×7): 1000 mg via ORAL
  Filled 2016-05-14 (×7): qty 2

## 2016-05-14 MED ORDER — LACTATED RINGERS IV SOLN
INTRAVENOUS | Status: DC
  Administered 2016-05-14 – 2016-05-15 (×2): via INTRAVENOUS

## 2016-05-14 MED ORDER — CEFOTETAN DISODIUM 2 G IJ SOLR
2.0000 g | Freq: Two times a day (BID) | INTRAMUSCULAR | Status: AC
  Administered 2016-05-14: 2 g via INTRAVENOUS
  Filled 2016-05-14: qty 2

## 2016-05-14 MED ORDER — CEFOTETAN DISODIUM-DEXTROSE 2-2.08 GM-% IV SOLR
INTRAVENOUS | Status: AC
  Filled 2016-05-14: qty 50

## 2016-05-14 MED ORDER — FENTANYL CITRATE (PF) 100 MCG/2ML IJ SOLN
INTRAMUSCULAR | Status: DC | PRN
  Administered 2016-05-14 (×2): 50 ug via INTRAVENOUS

## 2016-05-14 MED ORDER — MIDAZOLAM HCL 2 MG/2ML IJ SOLN: 0.5000 mg | Freq: Once | INTRAMUSCULAR | Status: DC | PRN

## 2016-05-14 MED ORDER — HYDROMORPHONE HCL 2 MG/ML IJ SOLN
INTRAMUSCULAR | Status: AC
  Filled 2016-05-14: qty 1

## 2016-05-14 MED ORDER — INSULIN ASPART 100 UNIT/ML ~~LOC~~ SOLN
0.0000 [IU] | Freq: Three times a day (TID) | SUBCUTANEOUS | Status: DC
  Administered 2016-05-14: 5 [IU] via SUBCUTANEOUS
  Administered 2016-05-15 (×2): 3 [IU] via SUBCUTANEOUS
  Administered 2016-05-15: 8 [IU] via SUBCUTANEOUS
  Administered 2016-05-16 – 2016-05-18 (×8): 3 [IU] via SUBCUTANEOUS

## 2016-05-14 MED ORDER — CALCIUM CITRATE 950 (200 CA) MG PO TABS
500.0000 mg | ORAL_TABLET | Freq: Every day | ORAL | Status: DC
  Administered 2016-05-14 – 2016-05-16 (×3): 475 mg via ORAL
  Filled 2016-05-14 (×4): qty 1

## 2016-05-14 MED ORDER — METRONIDAZOLE 500 MG PO TABS: 1000.0000 mg | ORAL_TABLET | Freq: Three times a day (TID) | ORAL | Status: DC

## 2016-05-14 MED ORDER — AMLODIPINE BESYLATE 5 MG PO TABS
5.0000 mg | ORAL_TABLET | Freq: Every day | ORAL | Status: DC
Start: 2016-05-15 — End: 2016-05-18
  Administered 2016-05-15 – 2016-05-18 (×4): 5 mg via ORAL
  Filled 2016-05-14 (×4): qty 1

## 2016-05-14 MED ORDER — HYDROMORPHONE HCL 1 MG/ML IJ SOLN
0.2500 mg | INTRAMUSCULAR | Status: DC | PRN
  Administered 2016-05-14 (×2): 0.5 mg via INTRAVENOUS

## 2016-05-14 MED ORDER — PANTOPRAZOLE SODIUM 40 MG PO TBEC
40.0000 mg | DELAYED_RELEASE_TABLET | Freq: Every day | ORAL | Status: DC
  Administered 2016-05-15 – 2016-05-18 (×3): 40 mg via ORAL
  Filled 2016-05-14 (×4): qty 1

## 2016-05-14 MED ORDER — HYDROMORPHONE HCL 2 MG/ML IJ SOLN
0.5000 mg | INTRAMUSCULAR | Status: DC | PRN
  Administered 2016-05-15 – 2016-05-16 (×3): 0.5 mg via INTRAVENOUS
  Filled 2016-05-14 (×3): qty 1

## 2016-05-14 SURGICAL SUPPLY — 97 items
APPLIER CLIP 5 13 M/L LIGAMAX5 (MISCELLANEOUS)
APPLIER CLIP ROT 10 11.4 M/L (STAPLE)
BLADE EXTENDED COATED 6.5IN (ELECTRODE) ×2 IMPLANT
CANNULA REDUC XI 12-8 STAPL (CANNULA) ×1
CANNULA REDUCER 12-8 DVNC XI (CANNULA) ×1 IMPLANT
CELLS DAT CNTRL 66122 CELL SVR (MISCELLANEOUS) IMPLANT
CHLORAPREP W/TINT 26ML (MISCELLANEOUS) ×2 IMPLANT
CLIP APPLIE 5 13 M/L LIGAMAX5 (MISCELLANEOUS) IMPLANT
CLIP APPLIE ROT 10 11.4 M/L (STAPLE) IMPLANT
CLIP LIGATING HEM O LOK PURPLE (MISCELLANEOUS) IMPLANT
CLIP LIGATING HEMO O LOK GREEN (MISCELLANEOUS) IMPLANT
COUNTER NEEDLE 20 DBL MAG RED (NEEDLE) ×2 IMPLANT
COVER MAYO STAND STRL (DRAPES) ×6 IMPLANT
COVER TIP SHEARS 8 DVNC (MISCELLANEOUS) ×1 IMPLANT
COVER TIP SHEARS 8MM DA VINCI (MISCELLANEOUS) ×1
DECANTER SPIKE VIAL GLASS SM (MISCELLANEOUS) ×2 IMPLANT
DEVICE TROCAR PUNCTURE CLOSURE (ENDOMECHANICALS) IMPLANT
DRAIN CHANNEL 19F RND (DRAIN) ×2 IMPLANT
DRAPE ARM DVNC X/XI (DISPOSABLE) ×4 IMPLANT
DRAPE COLUMN DVNC XI (DISPOSABLE) ×1 IMPLANT
DRAPE DA VINCI XI ARM (DISPOSABLE) ×4
DRAPE DA VINCI XI COLUMN (DISPOSABLE) ×1
DRAPE SURG IRRIG POUCH 19X23 (DRAPES) ×2 IMPLANT
DRSG OPSITE POSTOP 4X10 (GAUZE/BANDAGES/DRESSINGS) IMPLANT
DRSG OPSITE POSTOP 4X6 (GAUZE/BANDAGES/DRESSINGS) ×2 IMPLANT
DRSG OPSITE POSTOP 4X8 (GAUZE/BANDAGES/DRESSINGS) IMPLANT
DRSG TEGADERM 2-3/8X2-3/4 SM (GAUZE/BANDAGES/DRESSINGS) ×2 IMPLANT
DRSG TEGADERM 4X4.75 (GAUZE/BANDAGES/DRESSINGS) IMPLANT
ELECT REM PT RETURN 15FT ADLT (MISCELLANEOUS) ×2 IMPLANT
ENDOLOOP SUT PDS II  0 18 (SUTURE)
ENDOLOOP SUT PDS II 0 18 (SUTURE) IMPLANT
EVACUATOR SILICONE 100CC (DRAIN) ×2 IMPLANT
GAUZE SPONGE 2X2 8PLY STRL LF (GAUZE/BANDAGES/DRESSINGS) ×1 IMPLANT
GAUZE SPONGE 4X4 12PLY STRL (GAUZE/BANDAGES/DRESSINGS) IMPLANT
GLOVE ECLIPSE 8.0 STRL XLNG CF (GLOVE) ×6 IMPLANT
GLOVE INDICATOR 8.0 STRL GRN (GLOVE) ×6 IMPLANT
GOWN STRL REUS W/TWL XL LVL3 (GOWN DISPOSABLE) ×16 IMPLANT
GRASPER ENDOPATH ANVIL 10MM (MISCELLANEOUS) IMPLANT
HOLDER FOLEY CATH W/STRAP (MISCELLANEOUS) ×2 IMPLANT
IRRIG SUCT STRYKERFLOW 2 WTIP (MISCELLANEOUS) ×2
IRRIGATION SUCT STRKRFLW 2 WTP (MISCELLANEOUS) ×1 IMPLANT
LEGGING LITHOTOMY PAIR STRL (DRAPES) ×4 IMPLANT
LUBRICANT JELLY K Y 4OZ (MISCELLANEOUS) ×2 IMPLANT
NEEDLE INSUFFLATION 14GA 120MM (NEEDLE) ×2 IMPLANT
PACK CARDIOVASCULAR III (CUSTOM PROCEDURE TRAY) ×2 IMPLANT
PACK COLON (CUSTOM PROCEDURE TRAY) ×2 IMPLANT
PAD POSITIONING PINK XL (MISCELLANEOUS) ×2 IMPLANT
PORT LAP GEL ALEXIS MED 5-9CM (MISCELLANEOUS) ×2 IMPLANT
RTRCTR WOUND ALEXIS 18CM MED (MISCELLANEOUS)
SCISSORS LAP 5X35 DISP (ENDOMECHANICALS) ×2 IMPLANT
SCRUB SOL TECHNICARE 32FOOTPED (MISCELLANEOUS) ×2 IMPLANT
SEAL CANN UNIV 5-8 DVNC XI (MISCELLANEOUS) ×3 IMPLANT
SEAL XI 5MM-8MM UNIVERSAL (MISCELLANEOUS) ×3
SEALER VESSEL DA VINCI XI (MISCELLANEOUS) ×1
SEALER VESSEL EXT DVNC XI (MISCELLANEOUS) ×1 IMPLANT
SET BI-LUMEN FLTR TB AIRSEAL (TUBING) ×2 IMPLANT
SET IRRIG TUBING LAPAROSCOPIC (IRRIGATION / IRRIGATOR) ×2 IMPLANT
SLEEVE ADV FIXATION 5X100MM (TROCAR) ×2 IMPLANT
SOLUTION ELECTROLUBE (MISCELLANEOUS) ×2 IMPLANT
SPONGE GAUZE 2X2 STER 10/PKG (GAUZE/BANDAGES/DRESSINGS) ×1
STAPLER 45 BLU RELOAD XI (STAPLE) IMPLANT
STAPLER 45 BLUE RELOAD XI (STAPLE)
STAPLER 45 GREEN RELOAD XI (STAPLE) ×1
STAPLER 45 GRN RELOAD XI (STAPLE) ×1 IMPLANT
STAPLER CANNULA SEAL DVNC XI (STAPLE) ×1 IMPLANT
STAPLER CANNULA SEAL XI (STAPLE) ×1
STAPLER CIRC CVD 29MM 37CM (STAPLE) ×2 IMPLANT
STAPLER SHEATH (SHEATH) ×1
STAPLER SHEATH ENDOWRIST DVNC (SHEATH) ×1 IMPLANT
SUT MNCRL AB 4-0 PS2 18 (SUTURE) ×2 IMPLANT
SUT PDS AB 1 CTX 36 (SUTURE) ×4 IMPLANT
SUT PDS AB 1 TP1 96 (SUTURE) IMPLANT
SUT PDS AB 2-0 CT2 27 (SUTURE) IMPLANT
SUT PROLENE 0 CT 2 (SUTURE) IMPLANT
SUT PROLENE 2 0 KS (SUTURE) ×2 IMPLANT
SUT PROLENE 2 0 SH DA (SUTURE) ×2 IMPLANT
SUT SILK 2 0 (SUTURE) ×1
SUT SILK 2 0 SH CR/8 (SUTURE) ×2 IMPLANT
SUT SILK 2-0 18XBRD TIE 12 (SUTURE) ×1 IMPLANT
SUT SILK 3 0 (SUTURE) ×1
SUT SILK 3 0 SH CR/8 (SUTURE) ×2 IMPLANT
SUT SILK 3-0 18XBRD TIE 12 (SUTURE) ×1 IMPLANT
SUT V-LOC BARB 180 2/0GR6 GS22 (SUTURE) ×4
SUT VIC AB 3-0 SH 18 (SUTURE) IMPLANT
SUT VIC AB 3-0 SH 27 (SUTURE)
SUT VIC AB 3-0 SH 27XBRD (SUTURE) IMPLANT
SUT VICRYL 0 UR6 27IN ABS (SUTURE) ×2 IMPLANT
SUTURE V-LC BRB 180 2/0GR6GS22 (SUTURE) ×2 IMPLANT
SYR 10ML LL (SYRINGE) ×2 IMPLANT
SYRINGE 20CC LL (MISCELLANEOUS) ×2 IMPLANT
SYS LAPSCP GELPORT 120MM (MISCELLANEOUS)
SYSTEM LAPSCP GELPORT 120MM (MISCELLANEOUS) IMPLANT
TAPE UMBILICAL COTTON 1/8X30 (MISCELLANEOUS) ×2 IMPLANT
TOWEL OR 17X26 10 PK STRL BLUE (TOWEL DISPOSABLE) IMPLANT
TOWEL OR NON WOVEN STRL DISP B (DISPOSABLE) ×2 IMPLANT
TROCAR ADV FIXATION 5X100MM (TROCAR) IMPLANT
TUBING CONNECTING 10 (TUBING) IMPLANT

## 2016-05-14 NOTE — Interval H&P Note (Signed)
History and Physical Interval Note:  05/14/2016 9:47 AM  [name]  has presented today for surgery, with the diagnosis of COLOVESICAL FISTULA  The various methods of treatment have been discussed with the patient and family. After consideration of risks, benefits and other options for treatment, the patient has consented to  Procedure(s): XI ROBOT ASSISTED LAPAROSCOPIC RESECTION OF RECTOSIGMOID COLON. REPAIR OF COLOVAGINAL FISTULA AND RIGID PROCTOSCOPY (N/A) as a surgical intervention .  The patient's history has been reviewed, patient examined, no change in status, stable for surgery.  I have reviewed the patient's chart and labs.  Questions were answered to the patient's satisfaction.     Aqsa Sensabaugh C.

## 2016-05-14 NOTE — Op Note (Addendum)
05/14/2016  2:52 PM  PATIENT:  [name]  75 y.o. female  Patient Care Team: Tonia Ghent, MD as PCP - General Michael Boston, MD as Consulting Physician (General Surgery) Alden Hipp, MD as Consulting Physician (Obstetrics and Gynecology) Irene Shipper, MD as Consulting Physician (Gastroenterology)  PRE-OPERATIVE DIAGNOSIS:  COLOVAGINAL FISTULA  POST-OPERATIVE DIAGNOSIS:  COLOVAGINAL FISTULA  PROCEDURE:   XI ROBOT ASSISTED LOW ANTERIOR RESECTION OF RECTOSIGMOID COLON  REPAIR OF COLOVAGINAL FISTULA  OMENTOPEXY RIGID PROCTOSCOPY  SURGEON:  Michael Boston, MD  ASSISTANT:  Stark Klein, MD  ANESTHESIA:   local and general  EBL:  Total I/O In: 2000 [I.V.:2000] Out: 700 [Urine:600; Blood:100]  Delay start of Pharmacological VTE agent (>24hrs) due to surgical blood loss or risk of bleeding:  no  DRAINS: (19Fr) Blake drain(s) in the PELVIS   SPECIMEN:  RECTOSIGMOID COLON WITH COLOVAGINAL FISTULA  DISPOSITION OF SPECIMEN:  PATHOLOGY  COUNTS:  YES  PLAN OF CARE: Admit to inpatient   PATIENT DISPOSITION:  PACU - hemodynamically stable.  INDICATION:    75 year old female colovaginal fistulas presumed to be diverticular in nature.  System.  I recommended segmental resection and repair of colovaginal fistula.:  The anatomy & physiology of the digestive tract was discussed.  The pathophysiology was discussed.  Natural history risks without surgery was discussed.   I worked to give an overview of the disease and the frequent need to have multispecialty involvement.  I feel the risks of no intervention will lead to serious problems that outweigh the operative risks; therefore, I recommended a partial colectomy to remove the pathology.  Minimally invasive  & open techniques were discussed.    Risks such as bleeding, infection, abscess, leak, reoperation, possible ostomy, hernia, heart attack, death, and other risks were discussed.  I noted a good likelihood this will help  address the problem.   Goals of post-operative recovery were discussed as well.  We will work to minimize complications.  Educational materials on the pathology had been given in the office.  Questions were answered.    The patient expressed understanding & wished to proceed with surgery.  OR FINDINGS:   Patient had inflamed rectosigmoid colon with distal sigmoid colon densely adherent to the left posterior lateral vaginal cuff.  This was the location of the COLOVAGINAL fistula.  No bladder involvement.  Vaginal repair is a primary repair with serrated absorbable 2-0 V lock with an omentopexy over the repair  The anastomosis rests 12 cm from the anal verge by rigid proctoscopy.  DESCRIPTION:   Informed consent was confirmed.  The patient underwent general anaesthesia without difficulty.  The patient was positioned appropriately.  VTE prevention in place.  The patient's abdomen was clipped, prepped, & draped in a sterile fashion.  Surgical timeout confirmed our plan.  The patient was positioned in reverse Trendelenburg.  Abdominal entry was gained using Varess technique with a trach hook on the anterior abdominal wall fascia in the left upper abdomen.  Entry was clean.  I induced carbon dioxide insufflation.  Camera inspection revealed no injury.  Extra ports were carefully placed under direct laparoscopic visualization.  I did laparoscopic lysis of adhesions to free the greater omentum off the adhesions to the left anterior pelvis.  I reflected the greater omentum and the upper abdomen the small bowel in the upper abdomen.  The patient was carefully positioned.  The Intuitive daVinci robot was carefully docked with camera & instruments carefully placed.  The patient had very inflamed  rectosigmoid colon for the pelvic brim to the anterior vaginal cuff, primarily on the left side.  I did robotic lyse adhesions to free the rectosigmoid colon in a lateral medial fashion.  A around the  descending/sigmoid junction and carried on more distally.  It was able to reflected off the psoas iliac and ureter.  Freed some attachments to the left pelvic rim and off the left mid side wall.  It was densely adherent to the presumed vaginal cuff.  I scored the base of peritoneum of the medial side of the mesentery of the left colon from the ligament of Treitz to the peritoneal reflection of the mid rectum.   I elevated the sigmoid mesentery and entered into the retro-mesenteric plane. We were able to identify the left ureter and gonadal vessels. We kept those posterior within the retroperitoneum and elevated the left colon mesentery off that. I did isolate the inferior mesenteric artery (IMA) pedicle but did not ligate it yet.  I continued distally and got into the avascular plane posterior to the mesorectum. This allowed me to help mobilize the rectum as well by freeing the mesorectum off the sacrum.    I stayed away from the right and left ureters.  I kept the lateral vascular pedicles to the rectum intact.  I mobilized the left colon in a lateral medial fashion up towards the splenic flexure actually was enough mobility.  Freed the left colon off the retroperitoneum up to the mid kidney as well.  With posterior & lateral rectosigmoid mobilization,  I began to come across the anterior rectal wall and vaginal cuff with sharp cautery scissors.  Encountered into a small abscess cavity.  Eventually came around off the vaginal cuff and left pelvic sidewall.  I took care to stay away from the left ureter.  Then mobilized the rectosigmoid colon off the proximal rectum and mid rectum to help straighten it out as it was quite corkscrewed upon itself.  It did help freed appeared to reflection on the mid rectum down to the peroneal flexion to help straighten it out.  I then did vaginal examination and could feel dimpling on the proximal vaginal cuff on the left posterior aspect.  I cannot feel any definite mucosal  defect.  Everything else was intact.  I skeletonized at the mid mesorectum and transected at the  junction between the proximal and mid rectum ectum using a robotic 45 mm stapler.  I chose a region at the descending/sigmoid junction that was soft and easily reached down to the rectal stump.  I transected the mesentery of the colon radially  just distal to the inferior mesenteric artery and left colic artery pedicle to preserve remaining colon blood supply.   I proceeded to close the vaginal wall defect to close colovaginal fistula.  I used a 20 serrated absorbable V lock suture starting at the right vaginal cuff and coming across to the left side, focusing on closing the defect on the left corner of the vaginal cuff..  I did it with a running Connell type suture to help imbricate the area.  I then placed another 2-0 V lock on the right side of the vaginal cuff.    we undocked the robot.  I extended the 12 mm stapler port tranversely.  Placed a wound protector.  I was able to eviscerate the rectosigmoid and descending colon out the wound.    the preselected area in the distal descending colon was viable.  Clamped with a pursestring urine  passed a 2-0 Prolene suture using a Keith needle.  I transected at this descending/sigmoid junction with a scalpel. I got healthy bleeding mucosa.  We sent the rectosigmoid colon specimen off to go to pathology.  We sized the colon orifice.  I chose a 29 EEA anvil stapler system.  I secured the pursestring with interrupted 2-0 silk sutures around the periphery of the end of the colon anastomosis 3.  I placed the anvil to the open end of the proximal remaining colon and closed around it using a 0 Prolene pursestring.    Return to the colon stump and anvil into the abdominal cavity and did laparoscopic inspection.  We did copious irrigation with crystalloid solution.  Hemostasis was good.  The distal end of the remaining colon easily reached down to the rectal stump,  therefore,  further splenic flexure mobilization was not needed.      Dr Barry Dienes scrubbed down and did gentle anal dilation and advanced the EEA stapler up the rectal stump.  It would not reach all the way to the proximal and stump.  Therefore we redirected the stapler to the anterior rectal wall.  The spike was brought out at the anterior rectal wall 5 cm proximal to the staple line under direct visualization.  I attached the anvil of the proximal colon the spike of the stapler. Anvil was tightened down and held clamped for 60 seconds. The EEA stapler was fired and held clamped for 30 seconds. The stapler was released & removed. We noted 2 excellent anastomotic rings. Blue stitch is in the proximal ring.  Dr Barry Dienes did rigid proctoscopy noted the anastomosis was at 12 cm from the anal verge consistent with the proximal rectum.  We did a final irrigation of antibiotic solution (900 mg clindamycin/240 mg gentamicin in a liter of crystalloid) & held that for the pelvic air leak test .  The rectum was insufflated the rectum while clamping the colon proximal to that anastomosis.  There was a negative air leak test. There was no tension of mesentery or bowel at the anastomosis.   Tissues looked viable.  Ureters & bowel uninjured.  The anastomosis looked healthy.  Then brought the greater omentum down to help fill the pelvis anteriorly and laterally.  I sutured it to the vaginal cuff with those 20V lock sutures to help pexy the omentum over the vaginal cuff.  Allis lens omentum to be able to help protect the anastomosis as well.  Place a drain from the left upper quadrant port side with detail coming down the right posterior lateral corner to help drain the deep pelvis as well.   Endoluminal gas was evacuated.  Ports & wound protector removed.  We changed gloves & redraped the patient per colon SSI prevention protocol.  We aspirated the antibiotic irrigation.   drain secured with 2-0 Prolene suture.  Hemostasis was  good.  Sterile unused instruments were used from this point.  I closed the 45mm port sites using Monocryl stitch and sterile dressing.  I closed the extraction wound using a 0 Vicryl vertical retrorectus and peritoneal closure and a #1 PDS transverse anterior rectal fascial closure like a small Pfannenstiel closure. I closed the skin with some interrupted Monocryl stitches. I placed antibiotic-soaked wicks into the closure at the corners x2.  I placed sterile dressings.     Patient is being extubated go to recovery room. I had discussed postop care with the patient in detail the office & in the holding area.  Instructions are written. I'm about to locate family and discuss it with them as well.  [name], [name], [name] Gastrointestinal and Minimally Invasive Surgery [facility] [address], [place], [place] [id number] [phone] [address] / Paging

## 2016-05-14 NOTE — Discharge Instructions (Signed)
SURGERY: POST OP INSTRUCTIONS °(Surgery for small bowel obstruction, colon resection, etc) ° ° °###################################################################### ° °EAT °Gradually transition to a high fiber diet with a fiber supplement over the next few days after discharge ° °WALK °Walk an hour a day.  Control your pain to do that.   ° °CONTROL PAIN °Control pain so that you can walk, sleep, tolerate sneezing/coughing, go up/down stairs. ° °HAVE A BOWEL MOVEMENT DAILY °Keep your bowels regular to avoid problems.  OK to try a laxative to override constipation.  OK to use an antidairrheal to slow down diarrhea.  Call if not better after 2 tries ° °CALL IF YOU HAVE PROBLEMS/CONCERNS °Call if you are still struggling despite following these instructions. °Call if you have concerns not answered by these instructions ° °###################################################################### ° ° °DIET °Follow a light diet the first few days at home.  Start with a bland diet such as soups, liquids, starchy foods, low fat foods, etc.  If you feel full, bloated, or constipated, stay on a ful liquid or pureed/blenderized diet for a few days until you feel better and no longer constipated. °Be sure to drink plenty of fluids every day to avoid getting dehydrated (feeling dizzy, not urinating, etc.). °Gradually add a fiber supplement to your diet over the next week.  Gradually get back to a regular solid diet.  Avoid fast food or heavy meals the first week as you are more likely to get nauseated. °It is expected for your digestive tract to need a few months to get back to normal.  It is common for your bowel movements and stools to be irregular.  You will have occasional bloating and cramping that should eventually fade away.  Until you are eating solid food normally, off all pain medications, and back to regular activities; your bowels will not be normal. °Focus on eating a low-fat, high fiber diet the rest of your life  (See Getting to Good Bowel Health, below). ° °CARE of your INCISION or WOUND °It is good for closed incision and even open wounds to be washed every day.  Shower every day.  Short baths are fine.  Wash the incisions and wounds clean with soap & water.    °If you have a closed incision(s), wash the incision with soap & water every day.  You may leave closed incisions open to air if it is dry.   You may cover the incision with clean gauze & replace it after your daily shower for comfort. °If you have skin tapes (Steristrips) or skin glue (Dermabond) on your incision, leave them in place.  They will fall off on their own like a scab.  You may trim any edges that curl up with clean scissors.  If you have staples, set up an appointment for them to be removed in the office in 10 days after surgery.  °If you have a drain, wash around the skin exit site with soap & water and place a new dressing of gauze or band aid around the skin every day.  Keep the drain site clean & dry.    °If you have an open wound with packing, see wound care instructions.  In general, it is encouraged that you remove your dressing and packing, shower with soap & water, and replace your dressing once a day.  Pack the wound with clean gauze moistened with normal (0.9%) saline to keep the wound moist & uninfected.  Pressure on the dressing for 30 minutes will stop most wound   bleeding.  Eventually your body will heal & pull the open wound closed over the next few months.  °Raw open wounds will occasionally bleed or secrete yellow drainage until it heals closed.  Drain sites will drain a little until the drain is removed.  Even closed incisions can have mild bleeding or drainage the first few days until the skin edges scab over & seal.   °If you have an open wound with a wound vac, see wound vac care instructions. ° ° ° ° °ACTIVITIES as tolerated °Start light daily activities --- self-care, walking, climbing stairs-- beginning the day after surgery.   Gradually increase activities as tolerated.  Control your pain to be active.  Stop when you are tired.  Ideally, walk several times a day, eventually an hour a day.   °Most people are back to most day-to-day activities in a few weeks.  It takes 4-8 weeks to get back to unrestricted, intense activity. °If you can walk 30 minutes without difficulty, it is safe to try more intense activity such as jogging, treadmill, bicycling, low-impact aerobics, swimming, etc. °Save the most intensive and strenuous activity for last (Usually 4-8 weeks after surgery) such as sit-ups, heavy lifting, contact sports, etc.  Refrain from any intense heavy lifting or straining until you are off narcotics for pain control.  You will have off days, but things should improve week-by-week. °DO NOT PUSH THROUGH PAIN.  Let pain be your guide: If it hurts to do something, don't do it.  Pain is your body warning you to avoid that activity for another week until the pain goes down. °You may drive when you are no longer taking narcotic prescription pain medication, you can comfortably wear a seatbelt, and you can safely make sudden turns/stops to protect yourself without hesitating due to pain. °You may have sexual intercourse when it is comfortable. If it hurts to do something, stop. ° °MEDICATIONS °Take your usually prescribed home medications unless otherwise directed.   °Blood thinners:  °Usually you can restart any strong blood thinners after the second postoperative day.  It is OK to take aspirin right away.    ° If you are on strong blood thinners (warfarin/Coumadin, Plavix, Xerelto, Eliquis, Pradaxa, etc), discuss with your surgeon, medicine PCP, and/or cardiologist for instructions on when to restart the blood thinner & if blood monitoring is needed (PT/INR blood check, etc).   ° ° °PAIN CONTROL °Pain after surgery or related to activity is often due to strain/injury to muscle, tendon, nerves and/or incisions.  This pain is usually  short-term and will improve in a few months.  °To help speed the process of healing and to get back to regular activity more quickly, DO THE FOLLOWING THINGS TOGETHER: °1. Increase activity gradually.  DO NOT PUSH THROUGH PAIN °2. Use Ice and/or Heat °3. Try Gentle Massage and/or Stretching °4. Take over the counter pain medication °5. Take Narcotic prescription pain medication for more severe pain ° °Good pain control = faster recovery.  It is better to take more medicine to be more active than to stay in bed all day to avoid medications. °1.  Increase activity gradually °Avoid heavy lifting at first, then increase to lifting as tolerated over the next 6 weeks. °Do not “push through” the pain.  Listen to your body and avoid positions and maneuvers than reproduce the pain.  Wait a few days before trying something more intense °Walking an hour a day is encouraged to help your body recover faster   and more safely.  Start slowly and stop when getting sore.  If you can walk 30 minutes without stopping or pain, you can try more intense activity (running, jogging, aerobics, cycling, swimming, treadmill, sex, sports, weightlifting, etc.) °Remember: If it hurts to do it, then don’t do it! °2. Use Ice and/or Heat °You will have swelling and bruising around the incisions.  This will take several weeks to resolve. °Ice packs or heating pads (6-8 times a day, 30-60 minutes at a time) will help sooth soreness & bruising. °Some people prefer to use ice alone, heat alone, or alternate between ice & heat.  Experiment and see what works best for you.  Consider trying ice for the first few days to help decrease swelling and bruising; then, switch to heat to help relax sore spots and speed recovery. °Shower every day.  Short baths are fine.  It feels good!  Keep the incisions and wounds clean with soap & water.   °3. Try Gentle Massage and/or Stretching °Massage at the area of pain many times a day °Stop if you feel pain - do not  overdo it °4. Take over the counter pain medication °This helps the muscle and nerve tissues become less irritable and calm down faster °Choose ONE of the following over-the-counter anti-inflammatory medications: °Acetaminophen 500mg tabs (Tylenol) 1-2 pills with every meal and just before bedtime (avoid if you have liver problems or if you have acetaminophen in you narcotic prescription) °Naproxen 220mg tabs (ex. Aleve, Naprosyn) 1-2 pills twice a day (avoid if you have kidney, stomach, IBD, or bleeding problems) °Ibuprofen 200mg tabs (ex. Advil, Motrin) 3-4 pills with every meal and just before bedtime (avoid if you have kidney, stomach, IBD, or bleeding problems) °Take with food/snack several times a day as directed for at least 2 weeks to help keep pain / soreness down & more manageable. °5. Take Narcotic prescription pain medication for more severe pain °A prescription for strong pain control is often given to you upon discharge (for example: oxycodone/Percocet, hydrocodone/Norco/Vicodin, or tramadol/Ultram) °Take your pain medication as prescribed. °Be mindful that most narcotic prescriptions contain Tylenol (acetaminophen) as well - avoid taking too much Tylenol. °If you are having problems/concerns with the prescription medicine (does not control pain, nausea, vomiting, rash, itching, etc.), please call us [phone] to see if we need to switch you to a different pain medicine that will work better for you and/or control your side effects better. °If you need a refill on your pain medication, you must call the office before 4 pm and on weekdays only.  By federal law, prescriptions for narcotics cannot be called into a pharmacy.  They must be filled out on paper & picked up from our office by the patient or authorized caretaker.  Prescriptions cannot be filled after 4 pm nor on weekends.   ° °WHEN TO CALL US [phone] °Severe uncontrolled or worsening pain  °Fever over 101 F (38.5 C) °Concerns with  the incision: Worsening pain, redness, rash/hives, swelling, bleeding, or drainage °Reactions / problems with new medications (itching, rash, hives, nausea, etc.) °Nausea and/or vomiting °Difficulty urinating °Difficulty breathing °Worsening fatigue, dizziness, lightheadedness, blurred vision °Other concerns °If you are not getting better after two weeks or are noticing you are getting worse, contact our office [phone] for further advice.  We may need to adjust your medications, re-evaluate you in the office, send you to the emergency room, or see what other things we can do to help. °The   clinic staff is available to answer your questions during regular business hours (8:30am-5pm).  Please don’t hesitate to call and ask to speak to one of our nurses for clinical concerns.    °A surgeon from [facility] is always on call at the hospitals 24 hours/day °If you have a medical emergency, go to the nearest emergency room or call 911. ° °FOLLOW UP in our office °One the day of your discharge from the hospital (or the next business weekday), please call [facility] to set up or confirm an appointment to see your surgeon in the office for a follow-up appointment.  Usually it is 2-3 weeks after your surgery.   °If you have skin staples at your incision(s), let the office know so we can set up a time in the office for the nurse to remove them (usually around 10 days after surgery). °Make sure that you call for appointments the day of discharge (or the next business weekday) from the hospital to ensure a convenient appointment time. °IF YOU HAVE DISABILITY OR FAMILY LEAVE FORMS, BRING THEM TO THE OFFICE FOR PROCESSING.  DO NOT GIVE THEM TO YOUR DOCTOR. ° °[facility] °[address], [address], [place], [place]  [address] ? °[phone] - Main °[phone] - Toll Free,  [phone] - Fax °[email or web address] ° °GETTING TO GOOD BOWEL HEALTH. °It is  expected for your digestive tract to need a few months to get back to normal.  It is common for your bowel movements and stools to be irregular.  You will have occasional bloating and cramping that should eventually fade away.  Until you are eating solid food normally, off all pain medications, and back to regular activities; your bowels will not be normal.   °Avoiding constipation °The goal: ONE SOFT BOWEL MOVEMENT A DAY!    °Drink plenty of fluids.  Choose water first. °TAKE A FIBER SUPPLEMENT EVERY DAY THE REST OF YOUR LIFE °During your first week back home, gradually add back a fiber supplement every day °Experiment which form you can tolerate.   There are many forms such as powders, tablets, wafers, gummies, etc °Psyllium bran (Metamucil), methylcellulose (Citrucel), Miralax or Glycolax, Benefiber, Flax Seed.  °Adjust the dose week-by-week (1/2 dose/day to 6 doses a day) until you are moving your bowels 1-2 times a day.  Cut back the dose or try a different fiber product if it is giving you problems such as diarrhea or bloating. °Sometimes a laxative is needed to help jump-start bowels if constipated until the fiber supplement can help regulate your bowels.  If you are tolerating eating & you are farting, it is okay to try a gentle laxative such as double dose MiraLax, prune juice, or Milk of Magnesia.  Avoid using laxatives too often. °Stool softeners can sometimes help counteract the constipating effects of narcotic pain medicines.  It can also cause diarrhea, so avoid using for too long. °If you are still constipated despite taking fiber daily, eating solids, and a few doses of laxatives, call our office. °Controlling diarrhea °Try drinking liquids and eating bland foods for a few days to avoid stressing your intestines further. °Avoid dairy products (especially milk & ice cream) for a short time.  The intestines often can lose the ability to digest lactose when stressed. °Avoid foods that cause gassiness or  bloating.  Typical foods include beans and other legumes, cabbage, broccoli, and dairy foods.  Avoid greasy, spicy, fast foods.  Every person has   some sensitivity to other foods, so listen to your body and avoid those foods that trigger problems for you. °Probiotics (such as active yogurt, Align, etc) may help repopulate the intestines and colon with normal bacteria and calm down a sensitive digestive tract °Adding a fiber supplement gradually can help thicken stools by absorbing excess fluid and retrain the intestines to act more normally.  Slowly increase the dose over a few weeks.  Too much fiber too soon can backfire and cause cramping & bloating. °It is okay to try and slow down diarrhea with a few doses of antidiarrheal medicines.   °Bismuth subsalicylate (ex. Kayopectate, Pepto Bismol) for a few doses can help control diarrhea.  Avoid if pregnant.   °Loperamide (Imodium) can slow down diarrhea.  Start with one tablet (2mg) first.  Avoid if you are having fevers or severe pain.  °ILEOSTOMY PATIENTS WILL HAVE CHRONIC DIARRHEA since their colon is not in use.    °Drink plenty of liquids.  You will need to drink even more glasses of water/liquid a day to avoid getting dehydrated. °Record output from your ileostomy.  Expect to empty the bag every 3-4 hours at first.  Most people with a permanent ileostomy empty their bag 4-6 times at the least.   °Use antidiarrheal medicine (especially Imodium) several times a day to avoid getting dehydrated.  Start with a dose at bedtime & breakfast.  Adjust up or down as needed.  Increase antidiarrheal medications as directed to avoid emptying the bag more than 8 times a day (every 3 hours). °Work with your wound ostomy nurse to learn care for your ostomy.  See ostomy care instructions. °TROUBLESHOOTING IRREGULAR BOWELS °1) Start with a soft & bland diet. No spicy, greasy, or fried foods.  °2) Avoid gluten/wheat or dairy products from diet to see if symptoms improve. °3) Miralax  17gm or flax seed mixed in 8oz. water or juice-daily. May use 2-4 times a day as needed. °4) Gas-X, Phazyme, etc. as needed for gas & bloating.  °5) Prilosec (omeprazole) over-the-counter as needed °6)  Consider probiotics (Align, Activa, etc) to help calm the bowels down ° °Call your doctor if you are getting worse or not getting better.  Sometimes further testing (cultures, endoscopy, X-ray studies, CT scans, bloodwork, etc.) may be needed to help diagnose and treat the cause of the diarrhea. °[facility] °[address], [address], [place], [place]  [address] °[phone] - Main.    °[phone]  - Toll Free.   [phone] - Fax °[email or web address] ° ° °

## 2016-05-14 NOTE — H&P (View-Only) (Signed)
Burnis Medin Devoto 04/27/2016 10:22 AM Location: [facility] Patient #: [id number] DOB: [birth year] Widowed / Language: Cleophus Molt / Race: White Female  Patient Care Team: Tonia Ghent, MD as PCP - General Michael Boston, MD as Consulting Physician (General Surgery) Alden Hipp, MD as Consulting Physician (Obstetrics and Gynecology) Irene Shipper, MD as Consulting Physician (Gastroenterology)   History of Present Illness [name] MD; 04/27/2016 11:28 AM) The patient is a 75 year old female who presents with colovaginal fistula. Note for "Colovaginal fistula": Patient sent for surgical consultation at the request of her gastroenterologist, Dr. Scarlette Shorts with [facility] gastroenterology. Concern for colovaginal fistula.  Pleasant 75 year old female. Had prior GYN surgeries for tubal ligation, salpingo-oophorectomy, hysterectomy. Many years ago. Left inguinal hernia repair by Dr. Deon Pilling over a decade ago. Patient has had some issues with constipation and diverticulitis in the distant past. She recalls an episode of tripping the hospital 1993. She thinks she's had a few episodes of pain and discomfort suspicious for flares. Usually has gotten better on their own without needing oral antibiotics. She had an episode of rectal bleeding and underwent colonoscopy 2015 which revealed benign polyp and diverticulosis. No problems since. No melena. Can walk a half hour without difficulty. Had surgery for early stage left breast cancer by Dr. Donne Hazel with our group in 2013.   However she started noting some vaginal drainage about a month ago. Became more foul. Irritating. Concerned. Urinalysis was vaguely dirty but not classic for urinary tract infection. Discussed with her gastroenterologist. CT scan done. Gas bubbles between sigmoid colon and vagina strongly suspicious for colovaginal fistula. Surgical consultation requested.  Patient denies much in way of constipation  although occasionally has some hard stool that Metamucil helps with. She's been pretty miserable with the vaginal drainage. Burning and irritating. Had a dirty urinalysis of the gynecological office but not much problems now. She can walk an hour without difficulty. No cardiopulmonary issues. She does not smoke. She does have diabetes on metformin. Never required insulin. Last A1c was in the high eights no. Sugars occasionally get up to 200 but for the most part they're in the mid 100s.   Allergies Nance Pear, [place]; 04/27/2016 10:23 AM) Sulfa Antibiotics Vicodin *ANALGESICS - OPIOID* Simvastatin *ANTIHYPERLIPIDEMICS* Diclofenac Sodium *DERMATOLOGICALS* Flexeril *MUSCULOSKELETAL THERAPY AGENTS* Naproxen *ANALGESICS - ANTI-INFLAMMATORY*  Medication History Nance Pear, CMA; 04/27/2016 10:24 AM) MetFORMIN HCl (500MG  Tablet, Oral two times daily) Active. LORazepam (2MG  Tablet, Oral twice a day) Active. AmLODIPine Besylate (5MG  Tablet, Oral daily) Active. BuPROPion HCl ER (SR) (100MG  Tablet ER 12HR, Oral daily) Active. Metoprolol Tartrate (50MG  Tablet, Oral daily) Active. Omeprazole (20MG  Capsule DR, Oral daily) Active. Sertraline HCl (100MG  Tablet, Oral daily) Active. Letrozole (2.5MG  Tablet, Oral daily) Active. Medications Reconciled    Vitals Bary Castilla Bradford CMA; 04/27/2016 10:25 AM) 04/27/2016 10:25 AM Weight: 135.8 lb Height: 62in Body Surface Area: 1.62 m Body Mass Index: 24.84 kg/m  Temp.: 98.32F  Pulse: 67 (Regular)  BP: 132/78 (Sitting, Left Arm, Standard)      Physical Exam [name] MD; 04/27/2016 10:46 AM)  General Mental Status-Alert. General Appearance-Not in acute distress, Not Sickly. Orientation-Oriented X3. Hydration-Well hydrated. Voice-Normal.  Integumentary Global Assessment Upon inspection and palpation of skin surfaces of the - Axillae: non-tender, no inflammation or ulceration, no drainage.  and Distribution of scalp and body hair is normal. General Characteristics Temperature - normal warmth is noted.  Head and Neck Head-normocephalic, atraumatic with no lesions or palpable masses. Face Global Assessment -  atraumatic, no absence of expression. Neck Global Assessment - no abnormal movements, no bruit auscultated on the right, no bruit auscultated on the left, no decreased range of motion, non-tender. Trachea-midline. Thyroid Gland Characteristics - non-tender.  Eye Eyeball - Left-Extraocular movements intact, No Nystagmus. Eyeball - Right-Extraocular movements intact, No Nystagmus. Cornea - Left-No Hazy. Cornea - Right-No Hazy. Sclera/Conjunctiva - Left-No scleral icterus, No Discharge. Sclera/Conjunctiva - Right-No scleral icterus, No Discharge. Pupil - Left-Direct reaction to light normal. Pupil - Right-Direct reaction to light normal.  ENMT Ears Pinna - Left - no drainage observed, no generalized tenderness observed. Right - no drainage observed, no generalized tenderness observed. Nose and Sinuses External Inspection of the Nose - no destructive lesion observed. Inspection of the nares - Left - quiet respiration. Right - quiet respiration. Mouth and Throat Lips - Upper Lip - no fissures observed, no pallor noted. Lower Lip - no fissures observed, no pallor noted. Nasopharynx - no discharge present. Oral Cavity/Oropharynx - Tongue - no dryness observed. Oral Mucosa - no cyanosis observed. Hypopharynx - no evidence of airway distress observed.  Chest and Lung Exam Inspection Movements - Normal and Symmetrical. Accessory muscles - No use of accessory muscles in breathing. Palpation Palpation of the chest reveals - Non-tender. Auscultation Breath sounds - Normal and Clear.  Cardiovascular Auscultation Rhythm - Regular. Murmurs & Other Heart Sounds - Auscultation of the heart reveals - No Murmurs and No Systolic  Clicks.  Abdomen Inspection Inspection of the abdomen reveals - No Visible peristalsis and No Abnormal pulsations. Umbilicus - No Bleeding, No Urine drainage. Palpation/Percussion Palpation and Percussion of the abdomen reveal - Soft, Non Tender, No Rebound tenderness, No Rigidity (guarding) and No Cutaneous hyperesthesia. Note: Abdomen soft. Nontender, nondistended. No guarding. No umbilical no other hernias. No diastases recti.  Female Genitourinary Sexual Maturity Tanner 5 - Adult hair pattern. Note: At proximal apex of the vagina, small opening felt. Some irritation. Tenderness. No frank purulence. No vaginal mass felt. Moderate cystocele. Thin rectovaginal septum but no major rectocele. No distal rectovaginal fistula.  Exam done with assistance of female Medical Assistant in the room. No inguinal lymphadenopathy. No inguinal hernias. No frank feculent drainage. No frank bleeding. Mildly irritated urethra. Held off on speculum examination.  Rectal Note: Exam done with assistance of female Medical Assistant in the room. Perianal skin clean with good hygiene. No pruritis ani. No pilonidal disease. No fissure. No abscess/fistula. Normal sphincter tone. Tolerates digital rectal exam. No rectal masses felt to 7 cm. Mild right anterior internal/external hemorrhoid. No other external tags or hemorrhoids. Did not do any anoscopy given soft stool felt in rectal vault.  Peripheral Vascular Upper Extremity Inspection - Left - No Cyanotic nailbeds, Not Ischemic. Right - No Cyanotic nailbeds, Not Ischemic.  Neurologic Neurologic evaluation reveals -normal attention span and ability to concentrate, able to name objects and repeat phrases. Appropriate fund of knowledge , normal sensation and normal coordination. Mental Status Affect - not angry, not paranoid. Cranial Nerves-Normal Bilaterally. Gait-Normal.  Neuropsychiatric Mental status exam performed with findings of-able to  articulate well with normal speech/language, rate, volume and coherence, thought content normal with ability to perform basic computations and apply abstract reasoning and no evidence of hallucinations, delusions, obsessions or homicidal/suicidal ideation.  Musculoskeletal Global Assessment Spine, Ribs and Pelvis - no instability, subluxation or laxity. Right Upper Extremity - no instability, subluxation or laxity.  Lymphatic Head & Neck  General Head & Neck Lymphatics: Bilateral - Description - No Localized lymphadenopathy. Axillary  General Axillary Region: Bilateral - Description - No Localized lymphadenopathy. Femoral & Inguinal  Generalized Femoral & Inguinal Lymphatics: Left - Description - No Localized lymphadenopathy. Right - Description - No Localized lymphadenopathy.    Assessment & Plan [name] MD; 04/27/2016 11:11 AM)  COLOVAGINAL FISTULA (N82.4) Impression: History CT scan and physical exam suspicious for colovaginal fistula to the apex of the vagina.  She has no active abscess right now.  This will not resolve without surgery. Reasonable minimally invasive approach with rectosigmoid resection and vaginal repair. Try robotic/minimally invasive approach. She initially seemed hesitant about having to do surgery but then wanted to get it done as soon as possible because she is pretty miserable with this drainage. Her sister-in-law agrees.  PREOP COLON - ENCOUNTER FOR PREOPERATIVE EXAMINATION FOR GENERAL SURGICAL PROCEDURE (Z01.818)  Current Plans You are being scheduled for surgery - Our schedulers will call you.  You should hear from our office's scheduling department within 5 working days about the location, date, and time of surgery. We try to make accommodations for patient's preferences in scheduling surgery, but sometimes the OR schedule or the surgeon's schedule prevents [place] from making those accommodations.  If you have not heard from our office  [phone]) in 5 working days, call the office and ask for your surgeon's nurse.  If you have other questions about your diagnosis, plan, or surgery, call the office and ask for your surgeon's nurse.  Written instructions provided Pt Education - [facility] Colon Bowel Prep 2015 Miralax/Antibiotics Started Neomycin Sulfate 500MG , 2 (two) Tablet SEE NOTE, #6, 04/27/2016, No Refill. Local Order: TAKE TWO TABLETS AT 2 PM, 3 PM, AND 10 PM THE DAY PRIOR TO SURGERY Started Flagyl 500MG , 2 (two) Tablet SEE NOTE, #6, 04/27/2016, No Refill. Local Order: Take at 2pm, 3pm, and 10pm the day prior to your colon operation Pt Education - [facility] Colectomy post-op instructions: discussed with patient and provided information. Pt Education - [facility] Good Bowel Health (Caron Tardif) Pt Education - [facility] Pain Control (Azzure Garabedian) Pt Education - Pamphlet Given - Laparoscopic Colorectal Surgery: discussed with patient and provided information.  [name], [name], [name] Gastrointestinal and Minimally Invasive Surgery [facility] [address] [place],  [id number] [phone] Main / Paging

## 2016-05-14 NOTE — Anesthesia Postprocedure Evaluation (Signed)
Anesthesia Post Note  Patient: [name]  Procedure(s) Performed: Procedure(s) (LRB): XI ROBOT ASSISTED LOW ANTERIOR RESECTION OF RECTOSIGMOID COLON REPAIR OF COLOVAGINAL FISTULA  WITH OMENTOPEXY AND PROCTOSCOPY (N/A)  Patient location during evaluation: PACU Anesthesia Type: General Level of consciousness: oriented, sedated and patient cooperative Pain management: pain level controlled Vital Signs Assessment: post-procedure vital signs reviewed and stable Respiratory status: spontaneous breathing, nonlabored ventilation, respiratory function stable and patient connected to nasal cannula oxygen Cardiovascular status: blood pressure returned to baseline and stable Postop Assessment: no signs of nausea or vomiting Anesthetic complications: no    Last Vitals:  Vitals:   05/14/16 1615 05/14/16 1630  BP: (!) 141/76 (!) 163/73  Pulse: 73 75  Resp: 10 12  Temp: 36.5 C 36.5 C    Last Pain:  Vitals:   05/14/16 1615  TempSrc:   PainSc: 2                  Ugochukwu Chichester,E. Kashauna Celmer

## 2016-05-14 NOTE — Transfer of Care (Signed)
Immediate Anesthesia Transfer of Care Note  Patient: [name]  Procedure(s) Performed: Procedure(s): XI ROBOT ASSISTED LOW ANTERIOR RESECTION OF RECTOSIGMOID COLON REPAIR OF COLOVAGINAL FISTULA  WITH OMENTOPEXY AND PROCTOSCOPY (N/A)  Patient Location: PACU  Anesthesia Type:General  Level of Consciousness: awake, alert  and oriented  Airway & Oxygen Therapy: Patient Spontanous Breathing and Patient connected to face mask oxygen  Post-op Assessment: Report given to RN and Post -op Vital signs reviewed and stable  Post vital signs: Reviewed and stable  Last Vitals:  Vitals:   05/14/16 0855  BP: (!) 194/66  Pulse: 66  Resp: 16  Temp: 36.7 C    Last Pain:  Vitals:   05/14/16 1008  TempSrc:   PainSc: 0-No pain      Patients Stated Pain Goal: 4 ([id number])  Complications: No apparent anesthesia complications

## 2016-05-14 NOTE — Anesthesia Procedure Notes (Signed)
Procedure Name: Intubation Performed by: Vaniyah Lansky J Pre-anesthesia Checklist: Patient identified, Emergency Drugs available, Suction available, Patient being monitored and Timeout performed Patient Re-evaluated:Patient Re-evaluated prior to inductionOxygen Delivery Method: Circle system utilized Preoxygenation: Pre-oxygenation with 100% oxygen Intubation Type: IV induction Ventilation: Mask ventilation without difficulty Laryngoscope Size: Mac and 3 Grade View: Grade I Tube type: Oral Tube size: 7.0 mm Number of attempts: 1 Airway Equipment and Method: Stylet Placement Confirmation: ETT inserted through vocal cords under direct vision,  positive ETCO2,  CO2 detector and breath sounds checked- equal and bilateral Secured at: 21 cm Tube secured with: Tape Dental Injury: Teeth and Oropharynx as per pre-operative assessment        

## 2016-05-14 NOTE — Anesthesia Preprocedure Evaluation (Addendum)
Anesthesia Evaluation  Patient identified by MRN, date of birth, ID band Patient awake    Reviewed: Allergy & Precautions, NPO status , Patient's Chart, lab work & pertinent test results  History of Anesthesia Complications (+) PONV  Airway Mallampati: II  TM Distance: >3 FB Neck ROM: Full    Dental  (+) Dental Advisory Given   Pulmonary neg pulmonary ROS,    breath sounds clear to auscultation       Cardiovascular hypertension, Pt. on medications and Pt. on home beta blockers (-) angina Rhythm:Regular Rate:Normal     Neuro/Psych Anxiety Depression negative neurological ROS     GI/Hepatic GERD  Medicated and Controlled,  Endo/Other  diabetes (glu 210), Oral Hypoglycemic Agents  Renal/GU negative Renal ROS     Musculoskeletal   Abdominal   Peds  Hematology negative hematology ROS (+)   Anesthesia Other Findings Breast cancer  Reproductive/Obstetrics                            Anesthesia Physical Anesthesia Plan  ASA: II  Anesthesia Plan: General   Post-op Pain Management:    Induction: Intravenous  Airway Management Planned: Oral ETT  Additional Equipment:   Intra-op Plan:   Post-operative Plan: Extubation in OR  Informed Consent: I have reviewed the patients History and Physical, chart, labs and discussed the procedure including the risks, benefits and alternatives for the proposed anesthesia with the patient or authorized representative who has indicated his/her understanding and acceptance.   Dental advisory given  Plan Discussed with: CRNA and Surgeon  Anesthesia Plan Comments: (Plan routine monitors, GETA)        Anesthesia Quick Evaluation

## 2016-05-15 LAB — GLUCOSE, CAPILLARY
GLUCOSE-CAPILLARY: 162 mg/dL — AB (ref 65–99)
GLUCOSE-CAPILLARY: 180 mg/dL — AB (ref 65–99)
Glucose-Capillary: 253 mg/dL — ABNORMAL HIGH (ref 65–99)
Glucose-Capillary: 169 mg/dL — ABNORMAL HIGH (ref 65–99)

## 2016-05-15 LAB — BASIC METABOLIC PANEL
ANION GAP: 8 (ref 5–15)
CHLORIDE: 96 mmol/L — AB (ref 101–111)
CO2: 28 mmol/L (ref 22–32)
Calcium: 8.3 mg/dL — ABNORMAL LOW (ref 8.9–10.3)
Creatinine, Ser: 0.56 mg/dL (ref 0.44–1.00)
GFR calc Af Amer: >60 mL/min (ref >60)
GFR calc non Af Amer: >60 mL/min (ref >60)
GLUCOSE: 159 mg/dL — AB (ref 65–99)
POTASSIUM: 3 mmol/L — AB (ref 3.5–5.1)
Sodium: 132 mmol/L — ABNORMAL LOW (ref 135–145)

## 2016-05-15 LAB — CBC
HEMATOCRIT: 32.3 % — AB (ref 36.0–46.0)
HEMOGLOBIN: 11.2 g/dL — AB (ref 12.0–15.0)
MCH: 29.6 pg (ref 26.0–34.0)
MCHC: 34.7 g/dL (ref 30.0–36.0)
MCV: 85.4 fL (ref 78.0–100.0)
Platelets: 214 10*3/uL (ref 150–400)
RBC: 3.78 MIL/uL — ABNORMAL LOW (ref 3.87–5.11)
RDW: 13.2 % (ref 11.5–15.5)
WBC: 11.3 10*3/uL — ABNORMAL HIGH (ref 4.0–10.5)

## 2016-05-15 LAB — MAGNESIUM: Magnesium: 1.1 mg/dL — ABNORMAL LOW (ref 1.7–2.4)

## 2016-05-15 NOTE — Progress Notes (Signed)
General Surgery [facility]  Assessment & Plan:  Status post robotic LAR with repair colovaginal fistula POD#1  Patient OOB to chair this AM  Taking full liquid diet this AM per Dr. Johney Maine  Hgb stable  Monitor drain output - sanguinous        Earnstine Regal, MD, [facility]       Office: [phone]    Subjective: Patient up in chair, no complaints.  Daughter at bedside.  Objective: Vital signs in last 24 hours: Temp:  [97.4 F (36.3 C)-98.8 F (37.1 C)] 98.8 F (37.1 C) (12/02 0931) Pulse Rate:  [56-75] 64 (12/02 0931) Resp:  [9-17] 16 (12/02 0931) BP: (132-172)/(54-76) 140/57 (12/02 0931) SpO2:  [93 %-99 %] 93 % (12/02 0931) Weight:  [61.8 kg (136 lb 3.9 oz)] 61.8 kg (136 lb 3.9 oz) (12/02 0200) Last BM Date: 05/14/16  Intake/Output from previous day: 12/01 0701 - 12/02 0700 In: 3400.7 [P.O.:259; I.V.:3141.7] Out: [id number] [Urine:3150; Drains:635; Stool:1; Blood:100] Intake/Output this shift: Total I/O In: 480 [P.O.:480] Out: 320 [Urine:300; Drains:20]  Physical Exam: HEENT - sclerae clear, mucous membranes moist Neck - soft Chest - clear bilaterally Cor - RRR Abdomen - soft, dressings dry and intact; JP drain with moderate sanguinous output Ext - no edema, non-tender Neuro - alert & oriented, no focal deficits  Lab Results:   Recent Labs  05/15/16 0504  WBC 11.3*  HGB 11.2*  HCT 32.3*  PLT 214   BMET  Recent Labs  05/15/16 0504  NA 132*  K 3.0*  CL 96*  CO2 28  GLUCOSE 159*  BUN <5*  CREATININE 0.56  CALCIUM 8.3*   PT/INR No results for input(s): LABPROT, INR in the last 72 hours. Comprehensive Metabolic Panel:    Component Value Date/Time   NA 132 (L) 05/15/2016 0504   NA 136 05/12/2016 1149   NA 137 02/20/2016 1108   NA 135 (L) 02/20/2015 1404   K 3.0 (L) 05/15/2016 0504   K 3.7 05/12/2016 1149   K 4.1 02/20/2016 1108   K 4.1 02/20/2015 1404   CL 96 (L) 05/15/2016 0504   CL  103 05/12/2016 1149   CL 100 03/22/2012 1200   CO2 28 05/15/2016 0504   CO2 24 05/12/2016 1149   CO2 26 02/20/2016 1108   CO2 25 02/20/2015 1404   BUN <5 (L) 05/15/2016 0504   BUN 9 05/12/2016 1149   BUN 12.8 02/20/2016 1108   BUN 9.0 02/20/2015 1404   CREATININE 0.56 05/15/2016 0504   CREATININE 0.63 05/12/2016 1149   CREATININE 0.7 02/20/2016 1108   CREATININE 0.8 02/20/2015 1404   GLUCOSE 159 (H) 05/15/2016 0504   GLUCOSE 186 (H) 05/12/2016 1149   GLUCOSE 206 (H) 02/20/2016 1108   GLUCOSE 157 (H) 02/20/2015 1404   GLUCOSE 236 (H) 03/22/2012 1200   CALCIUM 8.3 (L) 05/15/2016 0504   CALCIUM 9.6 05/12/2016 1149   CALCIUM 9.8 02/20/2016 1108   CALCIUM 9.8 02/20/2015 1404   AST 43 (H) 02/20/2016 1108   AST 44 (H) 02/20/2015 1404   ALT 47 02/20/2016 1108   ALT 52 02/20/2015 1404   ALKPHOS 67 02/20/2016 1108   ALKPHOS 82 02/20/2015 1404   BILITOT 0.35 02/20/2016 1108   BILITOT 0.89 02/20/2015 1404   PROT 7.7 02/20/2016 1108   PROT 7.6 02/20/2015 1404   ALBUMIN 3.5 02/20/2016 1108   ALBUMIN 3.8 02/20/2015 1404    Studies/Results: No  results found.    Ariana Cavenaugh M 05/15/2016  Patient ID: Kennon Holter, female   DOB: [birth year], 75 y.o.   MRN: [id number]

## 2016-05-15 NOTE — Progress Notes (Signed)
PT Cancellation Note  Patient Details Name: [name] MRN: [id number] DOB: [birth year]   Cancelled Treatment:     PT order received but eval deferred at pt request.  Pt just up with nursing.  Will follow.   Rayshad Riviello 05/15/2016, 12:51 PM

## 2016-05-16 LAB — GLUCOSE, CAPILLARY
GLUCOSE-CAPILLARY: 233 mg/dL — AB (ref 65–99)
Glucose-Capillary: 172 mg/dL — ABNORMAL HIGH (ref 65–99)
Glucose-Capillary: 192 mg/dL — ABNORMAL HIGH (ref 65–99)
Glucose-Capillary: 194 mg/dL — ABNORMAL HIGH (ref 65–99)

## 2016-05-16 LAB — CBC
HCT: 33 % — ABNORMAL LOW (ref 36.0–46.0)
Hemoglobin: 11.2 g/dL — ABNORMAL LOW (ref 12.0–15.0)
MCH: 29.3 pg (ref 26.0–34.0)
MCHC: 33.9 g/dL (ref 30.0–36.0)
MCV: 86.4 fL (ref 78.0–100.0)
PLATELETS: 202 10*3/uL (ref 150–400)
RBC: 3.82 MIL/uL — ABNORMAL LOW (ref 3.87–5.11)
RDW: 13.6 % (ref 11.5–15.5)
WBC: 8.8 10*3/uL (ref 4.0–10.5)

## 2016-05-16 MED ORDER — KCL IN DEXTROSE-NACL 20-5-0.45 MEQ/L-%-% IV SOLN
INTRAVENOUS | Status: DC
  Administered 2016-05-16 – 2016-05-17 (×2): via INTRAVENOUS
  Filled 2016-05-16 (×2): qty 1000

## 2016-05-16 MED ORDER — ONDANSETRON HCL 4 MG/2ML IJ SOLN
4.0000 mg | Freq: Four times a day (QID) | INTRAMUSCULAR | Status: DC | PRN
  Filled 2016-05-16: qty 2

## 2016-05-16 NOTE — Progress Notes (Signed)
PT Note  Patient Details Name: [name] MRN: [id number] DOB: [birth year]        Chart reviewed and spoke with nursing and patient. Pt has been doing well with mobility dn has walked several times with family and nursing yesterday. No skilled PT needs at this time.    Clide Dales 05/16/2016, 10:33 AM  Clide Dales, PT Pager: [phone] 05/16/2016

## 2016-05-16 NOTE — Progress Notes (Signed)
General Surgery [facility]  Assessment & Plan:  Status post robotic LAR with repair colovaginal fistula POD#2             Patient with nausea, emesis while MD in room             Will decrease diet to clear liquids  Zofran IV prn nausea - instructed nurse to give now  OOB later today        Earnstine Regal, MD, [facility]       Office: [phone]    Subjective: Patient in bed, family at bedside.  Feels nauseous, emesis while MD in room.  Objective: Vital signs in last 24 hours: Temp:  [98.3 F (36.8 C)-98.9 F (37.2 C)] 98.8 F (37.1 C) (12/03 0515) Pulse Rate:  [65-86] 86 (12/03 0515) Resp:  [16-18] 18 (12/03 0515) BP: (105-173)/(57-67) 105/67 (12/03 0515) SpO2:  [93 %-97 %] 94 % (12/03 0515) Weight:  [60.2 kg (132 lb 11.5 oz)] 60.2 kg (132 lb 11.5 oz) (12/03 0600) Last BM Date: 05/15/16  Intake/Output from previous day: 12/02 0701 - 12/03 0700 In: 2760 [P.O.:1560; I.V.:1200] Out: 2940 [Urine:2800; Drains:140] Intake/Output this shift: Total I/O In: -  Out: 300 [Urine:300]  Physical Exam: HEENT - sclerae clear, mucous membranes moist Abdomen - moderate distension; wounds dry and intact, dressing in place; JP drain with serosanguinous Ext - no edema, non-tender Neuro - alert & oriented, no focal deficits  Lab Results:   Recent Labs  05/15/16 0504 05/16/16 0442  WBC 11.3* 8.8  HGB 11.2* 11.2*  HCT 32.3* 33.0*  PLT 214 202   BMET  Recent Labs  05/15/16 0504  NA 132*  K 3.0*  CL 96*  CO2 28  GLUCOSE 159*  BUN <5*  CREATININE 0.56  CALCIUM 8.3*   PT/INR No results for input(s): LABPROT, INR in the last 72 hours. Comprehensive Metabolic Panel:    Component Value Date/Time   NA 132 (L) 05/15/2016 0504   NA 136 05/12/2016 1149   NA 137 02/20/2016 1108   NA 135 (L) 02/20/2015 1404   K 3.0 (L) 05/15/2016 0504   K 3.7 05/12/2016 1149   K 4.1 02/20/2016 1108   K 4.1 02/20/2015 1404   CL  96 (L) 05/15/2016 0504   CL 103 05/12/2016 1149   CL 100 03/22/2012 1200   CO2 28 05/15/2016 0504   CO2 24 05/12/2016 1149   CO2 26 02/20/2016 1108   CO2 25 02/20/2015 1404   BUN <5 (L) 05/15/2016 0504   BUN 9 05/12/2016 1149   BUN 12.8 02/20/2016 1108   BUN 9.0 02/20/2015 1404   CREATININE 0.56 05/15/2016 0504   CREATININE 0.63 05/12/2016 1149   CREATININE 0.7 02/20/2016 1108   CREATININE 0.8 02/20/2015 1404   GLUCOSE 159 (H) 05/15/2016 0504   GLUCOSE 186 (H) 05/12/2016 1149   GLUCOSE 206 (H) 02/20/2016 1108   GLUCOSE 157 (H) 02/20/2015 1404   GLUCOSE 236 (H) 03/22/2012 1200   CALCIUM 8.3 (L) 05/15/2016 0504   CALCIUM 9.6 05/12/2016 1149   CALCIUM 9.8 02/20/2016 1108   CALCIUM 9.8 02/20/2015 1404   AST 43 (H) 02/20/2016 1108   AST 44 (H) 02/20/2015 1404   ALT 47 02/20/2016 1108   ALT 52 02/20/2015 1404   ALKPHOS 67 02/20/2016 1108   ALKPHOS 82 02/20/2015 1404   BILITOT 0.35 02/20/2016 1108   BILITOT 0.89 02/20/2015 1404   PROT  7.7 02/20/2016 1108   PROT 7.6 02/20/2015 1404   ALBUMIN 3.5 02/20/2016 1108   ALBUMIN 3.8 02/20/2015 1404    Studies/Results: No results found.    [name] M 05/16/2016  Patient ID: [name], female   DOB: [birth year], 75 y.o.   MRN: [id number]

## 2016-05-17 ENCOUNTER — Encounter (HOSPITAL_COMMUNITY): Payer: Self-pay | Admitting: Surgery

## 2016-05-17 DIAGNOSIS — I10 Essential (primary) hypertension: Secondary | ICD-10-CM | POA: Diagnosis present

## 2016-05-17 LAB — BASIC METABOLIC PANEL
Anion gap: 10 (ref 5–15)
CO2: 28 mmol/L (ref 22–32)
CREATININE: 0.5 mg/dL (ref 0.44–1.00)
Calcium: 9.2 mg/dL (ref 8.9–10.3)
Chloride: 99 mmol/L — ABNORMAL LOW (ref 101–111)
GFR calc Af Amer: >60 mL/min (ref >60)
Glucose, Bld: 290 mg/dL — ABNORMAL HIGH (ref 65–99)
Potassium: 2.8 mmol/L — ABNORMAL LOW (ref 3.5–5.1)
SODIUM: 137 mmol/L (ref 135–145)

## 2016-05-17 LAB — CBC
HEMATOCRIT: 37 % (ref 36.0–46.0)
Hemoglobin: 12.6 g/dL (ref 12.0–15.0)
MCH: 29.5 pg (ref 26.0–34.0)
MCHC: 34.1 g/dL (ref 30.0–36.0)
MCV: 86.7 fL (ref 78.0–100.0)
Platelets: 280 10*3/uL (ref 150–400)
RBC: 4.27 MIL/uL (ref 3.87–5.11)
RDW: 13.9 % (ref 11.5–15.5)
WBC: 8.6 10*3/uL (ref 4.0–10.5)

## 2016-05-17 LAB — GLUCOSE, CAPILLARY
GLUCOSE-CAPILLARY: 193 mg/dL — AB (ref 65–99)
GLUCOSE-CAPILLARY: 179 mg/dL — AB (ref 65–99)
Glucose-Capillary: 171 mg/dL — ABNORMAL HIGH (ref 65–99)
Glucose-Capillary: 228 mg/dL — ABNORMAL HIGH (ref 65–99)

## 2016-05-17 LAB — MAGNESIUM: MAGNESIUM: 1.2 mg/dL — AB (ref 1.7–2.4)

## 2016-05-17 MED ORDER — MAGNESIUM SULFATE 4 GM/100ML IV SOLN
4.0000 g | Freq: Once | INTRAVENOUS | Status: AC
  Administered 2016-05-17: 4 g via INTRAVENOUS
  Filled 2016-05-17: qty 100

## 2016-05-17 MED ORDER — SODIUM CHLORIDE 0.9 % IV SOLN
30.0000 meq | Freq: Once | INTRAVENOUS | Status: AC
  Administered 2016-05-17: 30 meq via INTRAVENOUS
  Filled 2016-05-17: qty 15

## 2016-05-17 MED ORDER — SODIUM CHLORIDE 0.9% FLUSH: 3.0000 mL | INTRAVENOUS | Status: DC | PRN

## 2016-05-17 MED ORDER — TRAMADOL HCL 50 MG PO TABS: 50.0000 mg | ORAL_TABLET | Freq: Four times a day (QID) | ORAL | Status: DC | PRN

## 2016-05-17 MED ORDER — POTASSIUM CHLORIDE CRYS ER 20 MEQ PO TBCR
40.0000 meq | EXTENDED_RELEASE_TABLET | Freq: Two times a day (BID) | ORAL | Status: DC
  Administered 2016-05-17 – 2016-05-18 (×3): 40 meq via ORAL
  Filled 2016-05-17 (×2): qty 2

## 2016-05-17 MED ORDER — SODIUM CHLORIDE 0.9% FLUSH
3.0000 mL | Freq: Two times a day (BID) | INTRAVENOUS | Status: DC
  Administered 2016-05-17 – 2016-05-18 (×2): 3 mL via INTRAVENOUS

## 2016-05-17 MED ORDER — LACTATED RINGERS IV BOLUS (SEPSIS): 1000.0000 mL | Freq: Three times a day (TID) | INTRAVENOUS | Status: DC | PRN

## 2016-05-17 MED ORDER — GLUCERNA SHAKE PO LIQD
237.0000 mL | Freq: Two times a day (BID) | ORAL | Status: DC
  Administered 2016-05-17 – 2016-05-18 (×3): 237 mL via ORAL
  Filled 2016-05-17 (×4): qty 237

## 2016-05-17 MED ORDER — SODIUM CHLORIDE 0.9 % IV SOLN: 250.0000 mL | INTRAVENOUS | Status: DC | PRN

## 2016-05-17 MED ORDER — LACTATED RINGERS IV SOLN
INTRAVENOUS | Status: DC
  Administered 2016-05-17: 08:00:00 via INTRAVENOUS

## 2016-05-17 NOTE — Progress Notes (Signed)
OT Cancellation Note  Patient Details Name: [name] MRN: [id number] DOB: August 10, 1940   Cancelled Treatment:    Reason Eval/Treat Not Completed: OT screened, no needs identified, will sign off  Spoke with PT- not OT needs. Betsy Pries 05/17/2016, 4:10 PM

## 2016-05-17 NOTE — Progress Notes (Signed)
[address] [place], [place] [id number] Phone: [phone] FAX: [phone]   [name] [id number] 03/13/1941    Problem List:   Principal Problem:   Colovaginal fistula s/p LAR rectosigmoid resection & colovaginal fistula repair/omentopexy 05/14/2016 Active Problems:   DM (diabetes mellitus) ([place])   Anxiety state   GERD   3 Days Post-Op  05/14/2016  POST-OPERATIVE DIAGNOSIS:  COLOVAGINAL FISTULA  PROCEDURE:   XI ROBOT ASSISTED LOW ANTERIOR RESECTION OF RECTOSIGMOID COLON  REPAIR OF COLOVAGINAL FISTULA  OMENTOPEXY RIGID PROCTOSCOPY  SURGEON:  Michael Boston, MD  OR FINDINGS:   Patient had inflamed rectosigmoid colon with distal sigmoid colon densely adherent to the left posterior lateral vaginal cuff.  This was the location of the COLOVAGINAL fistula.  No bladder involvement.  Vaginal pair is a primary repair with serrated 2-0 V lock with an omentopexy over the repair  The anastomosis rests 12 cm from the anal verge by rigid proctoscopy.   Assessment  Ileus resolving  Plan:  -adv fulls/pureeed -wean off IVF - PRN boluses -check labs - had hypokalemia -DM - glc control - stop D5.  Cont metformin & SSI -HTN control - metoprolol & PRN backup -VTE prophylaxis- SCDs, etc -mobilize as tolerated to help recovery  I updated the patient's status to the patient, nurse, and family.  Recommendations were made.  Questions were answered.  They expressed understanding & appreciation.   [name], [name], [name] Gastrointestinal and Minimally Invasive Surgery [facility] [address] [place], [place] [address] [phone] Main / Paging   05/17/2016  CARE TEAM:  PCP: Elsie Stain, MD  Outpatient Care Team: Patient Care Team: Tonia Ghent, MD as PCP - General Michael Boston, MD as Consulting Physician (General Surgery) Alden Hipp, MD as  Consulting Physician (Obstetrics and Gynecology) Irene Shipper, MD as Consulting Physician (Gastroenterology)  Inpatient Treatment Team: Treatment Team: Attending Provider: Michael Boston, MD; Technician: Abbe Amsterdam, NT; Technician: Tenna Child, NT; Occupational Therapist: Betsy Pries, OT; Technician: Sueanne Margarita, NT  Subjective:  Tol clears Family in room Did not walk after emesis yest AM "I think I shouldn't have taken all those pills on an empty stomach" No nausea now "When can I go home?"  Objective:  Vital signs:  Vitals:   05/16/16 1321 05/16/16 1538 05/16/16 2137 05/17/16 0610  BP: (!) 195/72 (!) 169/68 (!) 173/57 (!) 188/74  Pulse: 68  63 60  Resp: 18  16 14   Temp: 98.2 F (36.8 C)  97.8 F (36.6 C) 98.1 F (36.7 C)  TempSrc: Oral  Oral Oral  SpO2: 90%  97% 96%  Weight:    60.1 kg (132 lb 7.9 oz)  Height:        Last BM Date: 05/16/16  Intake/Output   Yesterday:  12/03 0701 - 12/04 0700 In: 2158.8 [P.O.:960; I.V.:1198.8] Out: 2440 [Urine:2350; Drains:90] This shift:  No intake/output data recorded.  Bowel function:  Flatus: YES  BM:  No  Drain: Serosanguinous   Physical Exam:  General: Pt awake/alert/oriented x4 in No acute distress Eyes: PERRL, normal EOM.  Sclera clear.  No icterus Neuro: CN II-XII intact w/o focal sensory/motor deficits. Lymph: No head/neck/groin lymphadenopathy Psych:  No delerium/psychosis/paranoia HENT: Normocephalic, Mucus membranes moist.  No thrush Neck: Supple, No tracheal deviation Chest: No chest wall pain w good excursion CV:  Pulses intact.  Regular rhythm MS: Normal AROM mjr joints.  No obvious deformity Abdomen: Soft.  Nondistended.  Nontender.  No evidence of peritonitis.  Dressings c/d/i  No incarcerated hernias. Gen:  No inguinal hernias.  No vaginal bleeding/discharge Ext:  SCDs BLE.  No mjr edema.  No cyanosis Skin: No petechiae / purpura  Results:   Labs: Results for orders  placed or performed during the hospital encounter of 05/14/16 (from the past 48 hour(s))  Glucose, capillary     Status: Abnormal   Collection Time: 05/15/16  8:01 AM  Result Value Ref Range   Glucose-Capillary 162 (H) 65 - 99 mg/dL   Comment 1 Notify RN    Comment 2 Document in Chart   Glucose, capillary     Status: Abnormal   Collection Time: 05/15/16 12:04 PM  Result Value Ref Range   Glucose-Capillary 253 (H) 65 - 99 mg/dL   Comment 1 Notify RN    Comment 2 Document in Chart   Glucose, capillary     Status: Abnormal   Collection Time: 05/15/16  4:55 PM  Result Value Ref Range   Glucose-Capillary 169 (H) 65 - 99 mg/dL   Comment 1 Notify RN    Comment 2 Document in Chart   Glucose, capillary     Status: Abnormal   Collection Time: 05/15/16 10:28 PM  Result Value Ref Range   Glucose-Capillary 180 (H) 65 - 99 mg/dL  CBC     Status: Abnormal   Collection Time: 05/16/16  4:42 AM  Result Value Ref Range   WBC 8.8 4.0 - 10.5 K/uL   RBC 3.82 (L) 3.87 - 5.11 MIL/uL   Hemoglobin 11.2 (L) 12.0 - 15.0 g/dL   HCT 33.0 (L) 36.0 - 46.0 %   MCV 86.4 78.0 - 100.0 fL   MCH 29.3 26.0 - 34.0 pg   MCHC 33.9 30.0 - 36.0 g/dL   RDW 13.6 11.5 - 15.5 %   Platelets 202 150 - 400 K/uL  Glucose, capillary     Status: Abnormal   Collection Time: 05/16/16  7:29 AM  Result Value Ref Range   Glucose-Capillary 172 (H) 65 - 99 mg/dL   Comment 1 Notify RN    Comment 2 Document in Chart   Glucose, capillary     Status: Abnormal   Collection Time: 05/16/16 11:59 AM  Result Value Ref Range   Glucose-Capillary 192 (H) 65 - 99 mg/dL   Comment 1 Notify RN    Comment 2 Document in Chart   Glucose, capillary     Status: Abnormal   Collection Time: 05/16/16  5:06 PM  Result Value Ref Range   Glucose-Capillary 194 (H) 65 - 99 mg/dL   Comment 1 Notify RN    Comment 2 Document in Chart   Glucose, capillary     Status: Abnormal   Collection Time: 05/16/16  9:32 PM  Result Value Ref Range    Glucose-Capillary 233 (H) 65 - 99 mg/dL  Glucose, capillary     Status: Abnormal   Collection Time: 05/17/16  7:28 AM  Result Value Ref Range   Glucose-Capillary 193 (H) 65 - 99 mg/dL    Imaging / Studies: No results found.  Medications / Allergies: per chart  Antibiotics: Anti-infectives    Start     Dose/Rate Route Frequency Ordered Stop   05/14/16 2200  metroNIDAZOLE (FLAGYL) tablet 1,000 mg  Status:  Discontinued     1,000 mg Oral 3 times daily 05/14/16 1714 05/14/16 1741   05/14/16 2200  neomycin (MYCIFRADIN) tablet 1,000  mg  Status:  Discontinued     1,000 mg Oral 3 times daily 05/14/16 1714 05/14/16 1741   05/14/16 2200  cefoTEtan (CEFOTAN) 2 g in dextrose 5 % 50 mL IVPB     2 g 100 mL/hr over 30 Minutes Intravenous Every 12 hours 05/14/16 1714 05/14/16 2226   05/14/16 0846  cefoTEtan (CEFOTAN) 2 g in dextrose 5 % 50 mL IVPB     2 g 100 mL/hr over 30 Minutes Intravenous On call to O.R. 05/14/16 0846 05/14/16 1135   05/14/16 0600  clindamycin (CLEOCIN) 900 mg, gentamicin (GARAMYCIN) 240 mg in sodium chloride 0.9 % 1,000 mL for intraperitoneal lavage  Status:  Discontinued      Intraperitoneal To Surgery 05/13/16 1300 05/14/16 1659        Note: Portions of this report may have been transcribed using voice recognition software. Every effort was made to ensure accuracy; however, inadvertent computerized transcription errors may be present.   Any transcriptional errors that result from this process are unintentional.     [name], [name], [name] Gastrointestinal and Minimally Invasive Surgery [facility] [address] [place],  [address] [phone] Main / Paging   05/17/2016

## 2016-05-18 DIAGNOSIS — E876 Hypokalemia: Secondary | ICD-10-CM

## 2016-05-18 LAB — GLUCOSE, CAPILLARY
GLUCOSE-CAPILLARY: 185 mg/dL — AB (ref 65–99)
Glucose-Capillary: 153 mg/dL — ABNORMAL HIGH (ref 65–99)

## 2016-05-18 MED ORDER — SODIUM CHLORIDE 0.9 % IV SOLN: 250.0000 mL | INTRAVENOUS | Status: DC | PRN

## 2016-05-18 MED ORDER — POTASSIUM CHLORIDE CRYS ER 20 MEQ PO TBCR: 40.0000 meq | EXTENDED_RELEASE_TABLET | Freq: Every day | ORAL | 0 refills | Status: DC

## 2016-05-18 MED ORDER — SODIUM CHLORIDE 0.9% FLUSH: 3.0000 mL | INTRAVENOUS | Status: DC | PRN

## 2016-05-18 MED ORDER — SODIUM CHLORIDE 0.9% FLUSH: 3.0000 mL | Freq: Two times a day (BID) | INTRAVENOUS | Status: DC

## 2016-05-18 NOTE — Care Management Important Message (Signed)
Important Message  Patient Details  Name: [name] MRN: [id number] Date of Birth: 1940-07-25   Medicare Important Message Given:  Yes    Camillo Flaming 05/18/2016, 10:21 AMImportant Message  Patient Details  Name: [name] MRN: [id number] Date of Birth: 03-12-1941   Medicare Important Message Given:  Yes    Camillo Flaming 05/18/2016, 10:21 AM

## 2016-05-18 NOTE — Progress Notes (Signed)
Patient status post robotic low anterior resection and repair of colovaginal fistula. Path benign.  Consistent with diverticulitis etiology I told the pt the good news.  Path report given

## 2016-05-18 NOTE — Discharge Summary (Signed)
Physician Discharge Summary  Patient ID: [name] MRN: [id number] DOB/AGE: 1941/03/05 75 y.o.  Admit date: 05/14/2016 Discharge date: 05/18/2016  Patient Care Team: Natalie Ghent, MD as PCP - General Natalie Boston, MD as Consulting Physician (General Surgery) Natalie Hipp, MD as Consulting Physician (Obstetrics and Gynecology) Natalie Shipper, MD as Consulting Physician (Gastroenterology)  Admission Diagnoses: Principal Problem:   Colovaginal fistula s/p LAR rectosigmoid resection & colovaginal fistula repair/omentopexy 05/14/2016 Active Problems:   DM (diabetes mellitus) ([place])   Anxiety state   GERD   Insomnia   Hypertension   Hypokalemia   Hypomagnesemia   Discharge Diagnoses:  Principal Problem:   Colovaginal fistula s/p LAR rectosigmoid resection & colovaginal fistula repair/omentopexy 05/14/2016 Active Problems:   DM (diabetes mellitus) ([place])   Anxiety state   GERD   Insomnia   Hypertension   Hypokalemia   Hypomagnesemia   SURGERY:  05/14/2016  POST-OPERATIVE DIAGNOSIS: COLOVAGINAL FISTULA  PROCEDURE:  XI ROBOT ASSISTED LOW ANTERIOR RESECTION OF RECTOSIGMOID COLON  REPAIR OF COLOVAGINAL FISTULA  OMENTOPEXY RIGIDPROCTOSCOPY  SURGEON: Natalie Boston, MD  OR FINDINGS:  Patient had inflamed rectosigmoid colon with distal sigmoid colon densely adherent to the left posterior lateral vaginal cuff. This was the location of the [place]. No bladder involvement.  Vaginal pair is a primary repair with serrated 2-0 V lock with an omentopexy over the repair  The anastomosis rests 12cm from the anal verge by rigid proctoscopy.  PATHOLOGY Diagnosis 1. Colon, segmental resection, rectosigmoid - BENIGN COLON WITH FULL THICKNESS DEFECT WITH ASSOCIATED ACUTE INFLAMMATION, CLINICALLY FISTULA. - DIVERTICULOSIS. - NO DYSPLASIA OR MALIGNANCY. 2. Colon, resection margin (donut), final distal - BENIGN COLONIC MUCOSA. - NO DYSPLASIA OR  MALIGNANCY. Natalie Males MD Pathologist, Electronic Signature (Case signed 05/17/2016) Specimen [name] and Clinical Information Specimen(s) Obtained: 1. Colon, segmental resection, rectosigmoid 2. Colon, resection margin (donut), final distal Specimen Clinical Information 1. colovesical fistula [rd] [name] 1. The specimen is received in formalin and consists of an unoriented length of colon with one open and one stapled resection margin. The specimen measures 16.0 cm in length and per the requisition the open end is proximal. The serosal surface is gray purple, hyperemic with moderate adhesions. There is a 3.5 x 2.0 cm area of indurated adipose tissue cautery artifact. The area is inked black, and a transmural defect is identified. The lumen is filled with a small amount of tan brown fecal material. The mucosa is tan pink with normal folding, and multiple diverticuli are identified. The wall ranges from 0.3 to 0.8 cm in thickness. No enlarged lymph nodes are grossly identified. Representative sections are submitted in four cassettes. A = proximal resection margin B = distal resection margins inked C = full thickness defect. D = diverticulum. 1 of 2 FINAL for [name] ([id number]) [name](continued) 2. The specimen is received in formalin and consists of a circular portion of tan brown, hyperemic mucosa measuring 1.7 x 1.6 cm in diameter and up to 0.7 cm in length. Representative sections are submitted in one cassette. (KL:kh 05-17-16) Report signed out from the following location(s) Technical component and interpretation was performed at [facility] [place], [place], [place] [address]. CLIA #: [id number], 2 of  Consults: None  Hospital Course:   The patient underwent the surgery above.  Postoperatively, the patient gradually mobilized and advanced to a solid diet.  Pain and other symptoms were treated aggressively.  Low Mag & K - replaced.  Glucose  monitored & treated  for her DM.  By the time of discharge, the patient was walking well the hallways, eating food, having flatus.  Pain was well-controlled on an oral medications.  Based on meeting discharge criteria and continuing to recover, I felt it was safe for the patient to be discharged from the hospital to further recover with close followup. Postoperative recommendations were discussed in detail.  They are written as well.   Significant Diagnostic Studies:  Results for orders placed or performed during the hospital encounter of 05/14/16 (from the past 72 hour(s))  Glucose, capillary     Status: Abnormal   Collection Time: 05/15/16  8:01 AM  Result Value Ref Range   Glucose-Capillary 162 (H) 65 - 99 mg/dL   Comment 1 Notify RN    Comment 2 Document in Chart   Glucose, capillary     Status: Abnormal   Collection Time: 05/15/16 12:04 PM  Result Value Ref Range   Glucose-Capillary 253 (H) 65 - 99 mg/dL   Comment 1 Notify RN    Comment 2 Document in Chart   Glucose, capillary     Status: Abnormal   Collection Time: 05/15/16  4:55 PM  Result Value Ref Range   Glucose-Capillary 169 (H) 65 - 99 mg/dL   Comment 1 Notify RN    Comment 2 Document in Chart   Glucose, capillary     Status: Abnormal   Collection Time: 05/15/16 10:28 PM  Result Value Ref Range   Glucose-Capillary 180 (H) 65 - 99 mg/dL  CBC     Status: Abnormal   Collection Time: 05/16/16  4:42 AM  Result Value Ref Range   WBC 8.8 4.0 - 10.5 K/uL   RBC 3.82 (L) 3.87 - 5.11 MIL/uL   Hemoglobin 11.2 (L) 12.0 - 15.0 g/dL   HCT 33.0 (L) 36.0 - 46.0 %   MCV 86.4 78.0 - 100.0 fL   MCH 29.3 26.0 - 34.0 pg   MCHC 33.9 30.0 - 36.0 g/dL   RDW 13.6 11.5 - 15.5 %   Platelets 202 150 - 400 K/uL  Glucose, capillary     Status: Abnormal   Collection Time: 05/16/16  7:29 AM  Result Value Ref Range   Glucose-Capillary 172 (H) 65 - 99 mg/dL   Comment 1 Notify RN    Comment 2 Document in Chart   Glucose, capillary     Status:  Abnormal   Collection Time: 05/16/16 11:59 AM  Result Value Ref Range   Glucose-Capillary 192 (H) 65 - 99 mg/dL   Comment 1 Notify RN    Comment 2 Document in Chart   Glucose, capillary     Status: Abnormal   Collection Time: 05/16/16  5:06 PM  Result Value Ref Range   Glucose-Capillary 194 (H) 65 - 99 mg/dL   Comment 1 Notify RN    Comment 2 Document in Chart   Glucose, capillary     Status: Abnormal   Collection Time: 05/16/16  9:32 PM  Result Value Ref Range   Glucose-Capillary 233 (H) 65 - 99 mg/dL  Glucose, capillary     Status: Abnormal   Collection Time: 05/17/16  7:28 AM  Result Value Ref Range   Glucose-Capillary 193 (H) 65 - 99 mg/dL  Basic metabolic panel     Status: Abnormal   Collection Time: 05/17/16  8:05 AM  Result Value Ref Range   Sodium 137 135 - 145 mmol/L   Potassium 2.8 (L) 3.5 - 5.1 mmol/L   Chloride 99 (  L) 101 - 111 mmol/L   CO2 28 22 - 32 mmol/L   Glucose, Bld 290 (H) 65 - 99 mg/dL   BUN <5 (L) 6 - 20 mg/dL   Creatinine, Ser 0.50 0.44 - 1.00 mg/dL   Calcium 9.2 8.9 - 10.3 mg/dL   GFR calc non Af Amer >60 >60 mL/min   GFR calc Af Amer >60 >60 mL/min    Comment: (NOTE) The eGFR has been calculated using the CKD EPI equation. This calculation has not been validated in all clinical situations. eGFR's persistently <60 mL/min signify possible Chronic Kidney Disease.    Anion gap 10 5 - 15  Magnesium     Status: Abnormal   Collection Time: 05/17/16  8:05 AM  Result Value Ref Range   Magnesium 1.2 (L) 1.7 - 2.4 mg/dL  CBC     Status: None   Collection Time: 05/17/16  8:05 AM  Result Value Ref Range   WBC 8.6 4.0 - 10.5 K/uL   RBC 4.27 3.87 - 5.11 MIL/uL   Hemoglobin 12.6 12.0 - 15.0 g/dL   HCT 37.0 36.0 - 46.0 %   MCV 86.7 78.0 - 100.0 fL   MCH 29.5 26.0 - 34.0 pg   MCHC 34.1 30.0 - 36.0 g/dL   RDW 13.9 11.5 - 15.5 %   Platelets 280 150 - 400 K/uL  Glucose, capillary     Status: Abnormal   Collection Time: 05/17/16 11:56 AM  Result Value Ref  Range   Glucose-Capillary 171 (H) 65 - 99 mg/dL  Glucose, capillary     Status: Abnormal   Collection Time: 05/17/16  4:48 PM  Result Value Ref Range   Glucose-Capillary 179 (H) 65 - 99 mg/dL  Glucose, capillary     Status: Abnormal   Collection Time: 05/17/16  9:25 PM  Result Value Ref Range   Glucose-Capillary 228 (H) 65 - 99 mg/dL    No results found.  Discharge Exam: Blood pressure (!) 174/64, pulse 65, temperature 97.7 F (36.5 C), temperature source Oral, resp. rate 16, height 5' 1"  (1.549 m), weight 57.7 kg (127 lb 3.3 oz), SpO2 96 %.  General: Pt awake/alert/oriented x4 in No acute distress.  Smiling. Eyes: PERRL, normal EOM.  Sclera clear.  No icterus Neuro: CN II-XII intact w/o focal sensory/motor deficits. Lymph: No head/neck/groin lymphadenopathy Psych:  No delerium/psychosis/paranoia HENT: Normocephalic, Mucus membranes moist.  No thrush Neck: Supple, No tracheal deviation Chest: No chest wall pain w good excursion CV:  Pulses intact.  Regular rhythm MS: Normal AROM mjr joints.  No obvious deformity Abdomen: Soft.  Nondistended.  Nontender.  No evidence of peritonitis.  Incisions closed & c/d/i  No incarcerated hernias. Gen:  No inguinal hernias.  No vaginal bleeding/discharge Ext:  SCDs BLE.  No mjr edema.  No cyanosis Skin: No petechiae / purpura  Discharged Condition: good   Past Medical History:  Diagnosis Date  . Anemia   . Anxiety   . Blood transfusion   . Blood transfusion without reported diagnosis 1983   prbc  . Breast cancer ([place]) 03/01/12   left breast lumpectomy=invasive ductal ca,grade I/III,INVILVES SKELATAL MUSCLE   er=+,pr=NEG. HER2 NEG.  . Cancer ([place]) 08/09/11 DX    LEFT  . Depression   . Diabetes mellitus 2011   type II, on pills   . Diverticulosis   . Dyspnea    with exertion if walks a long ways per patient   . GERD (gastroesophageal reflux disease)   . Headache   .  History of radiation therapy    RIGHT JAW 5 TXS FOR ABSESSED  TOOTH IN HER 30'S   . History of radiation therapy 04/10/12-05/26/12   left breast  . Hyperlipidemia   . Hypertension   . Insomnia, unspecified   . Osteoporosis, unspecified    with prev fosamax treatment  . Other abnormal glucose   . PONV (postoperative nausea and vomiting)     Past Surgical History:  Procedure Laterality Date  . ABDOMINAL HYSTERECTOMY  1983   b/ls alpingooph orectomy 4 years later  . BILATERAL OOPHORECTOMY  1987  . BREAST LUMPECTOMY  03/01/12   LEFT=INVASIVE DUCTAL CA,gRADE i/iii,INVASION AT PSTERIOR RESECTION MARGIN AND INVLOVES SKE;ATAL MUSCLE,PERINEURAL INVASION IDENTIFIED  . INGUINAL HERNIA REPAIR Left 1999   LIH  . ORIF FEMUR FRACTURE Right    titanium rod  . SIGMOIDOSCOPY    . TUBAL LIGATION  1975    Social History   Social History  . Marital status: Widowed    Spouse name: N/A  . Number of children: 2  . Years of education: N/A   Occupational History  . retired Retired   Social History Main Topics  . Smoking status: Never Smoker  . Smokeless tobacco: Never Used  . Alcohol use No  . Drug use: No  . Sexual activity: No     Comment: gxp2, pregnancy 1st child age 38, 2 step children, no hrt   Other Topics Concern  . Not on file   Social History Narrative   Widowed 2014    Family History  Problem Relation Age of Onset  . Diabetes Mother   . Alcohol abuse Father   . Diabetes Sister   . Bladder Cancer Brother   . Diabetes Sister   . Bladder Cancer Son   . Colon cancer Neg Hx   . Esophageal cancer Neg Hx   . Pancreatic cancer Neg Hx   . Rectal cancer Neg Hx   . Stomach cancer Neg Hx     Current Facility-Administered Medications  Medication Dose Route Frequency Provider Last Rate Last Dose  . 0.9 %  sodium chloride infusion  250 mL Intravenous PRN Natalie Boston, MD      . 0.9 %  sodium chloride infusion  250 mL Intravenous PRN Natalie Boston, MD      . acetaminophen (TYLENOL) tablet 1,000 mg  1,000 mg Oral TID Natalie Boston, MD    1,000 mg at 05/17/16 2151  . alum & mag hydroxide-simeth (MAALOX/MYLANTA) 200-200-20 MG/5ML suspension 30 mL  30 mL Oral Q6H PRN Natalie Boston, MD      . amLODipine (NORVASC) tablet 5 mg  5 mg Oral Daily Natalie Boston, MD   5 mg at 05/17/16 1037  . buPROPion (WELLBUTRIN SR) 12 hr tablet 100 mg  100 mg Oral Daily Natalie Boston, MD   100 mg at 05/17/16 1038  . calcium citrate (CALCITRATE - dosed in mg elemental calcium) tablet 475 mg  475 mg Oral QHS Natalie Boston, MD   475 mg at 05/16/16 2158  . cholecalciferol (VITAMIN D) tablet 1,000 Units  1,000 Units Oral Daily Natalie Boston, MD   1,000 Units at 05/16/16 0945  . cyclobenzaprine (FLEXERIL) tablet 5-10 mg  5-10 mg Oral BID PRN Natalie Boston, MD      . diphenhydrAMINE (BENADRYL) 12.5 MG/5ML elixir 12.5 mg  12.5 mg Oral Q6H PRN Natalie Boston, MD       Or  . diphenhydrAMINE (BENADRYL) injection 12.5 mg  12.5 mg Intravenous Q6H PRN  Natalie Boston, MD      . enoxaparin (LOVENOX) injection 40 mg  40 mg Subcutaneous Q24H Natalie Boston, MD   40 mg at 05/17/16 1638  . feeding supplement (GLUCERNA SHAKE) (GLUCERNA SHAKE) liquid 237 mL  237 mL Oral BID BM Natalie Boston, MD   237 mL at 05/17/16 1400  . HYDROmorphone (DILAUDID) injection 0.5-2 mg  0.5-2 mg Intravenous Q1H PRN Natalie Boston, MD   0.5 mg at 05/16/16 0946  . insulin aspart (novoLOG) injection 0-15 Units  0-15 Units Subcutaneous TID WC Natalie Boston, MD   3 Units at 05/17/16 1750  . insulin aspart (novoLOG) injection 0-5 Units  0-5 Units Subcutaneous QHS Natalie Boston, MD   2 Units at 05/17/16 2152  . lactated ringers bolus 1,000 mL  1,000 mL Intravenous Q8H PRN Natalie Boston, MD      . letrozole [facility]) tablet 2.5 mg  2.5 mg Oral Daily Natalie Boston, MD   2.5 mg at 05/17/16 1037  . lip balm (CARMEX) ointment 1 application  1 application Topical BID Natalie Boston, MD   1 application at 46/65/99 2150  . LORazepam (ATIVAN) tablet 1-2 mg  1-2 mg Oral Q8H PRN Natalie Boston, MD   2 mg at 05/18/16 0645  . magic  mouthwash  15 mL Oral QID PRN Natalie Boston, MD      . menthol-cetylpyridinium (CEPACOL) lozenge 3 mg  1 lozenge Oral PRN Natalie Boston, MD      . metFORMIN (GLUCOPHAGE) tablet 1,000 mg  1,000 mg Oral BID WC Natalie Boston, MD   1,000 mg at 05/17/16 1754  . metoprolol (LOPRESSOR) injection 5 mg  5 mg Intravenous Q6H PRN Natalie Boston, MD   5 mg at 05/17/16 3570  . metoprolol (LOPRESSOR) tablet 50 mg  50 mg Oral BID Natalie Boston, MD   50 mg at 05/17/16 2151  . omega-3 acid ethyl esters (LOVAZA) capsule 1 g  1 g Oral BID Natalie Boston, MD   1 g at 05/16/16 2158  . ondansetron (ZOFRAN) injection 4 mg  4 mg Intravenous Q6H PRN Armandina Gemma, MD      . pantoprazole (PROTONIX) EC tablet 40 mg  40 mg Oral Daily Natalie Boston, MD   40 mg at 05/16/16 0945  . phenol (CHLORASEPTIC) mouth spray 2 spray  2 spray Mouth/Throat PRN Natalie Boston, MD      . potassium chloride SA (K-DUR,KLOR-CON) CR tablet 40 mEq  40 mEq Oral BID Natalie Boston, MD   40 mEq at 05/17/16 2151  . saccharomyces boulardii (FLORASTOR) capsule 250 mg  250 mg Oral BID Natalie Boston, MD   250 mg at 05/17/16 2151  . sertraline (ZOLOFT) tablet 150 mg  150 mg Oral Daily Natalie Boston, MD   150 mg at 05/17/16 1037  . sodium chloride flush (NS) 0.9 % injection 3 mL  3 mL Intravenous Q12H Natalie Boston, MD   3 mL at 05/17/16 1042  . sodium chloride flush (NS) 0.9 % injection 3 mL  3 mL Intravenous PRN Natalie Boston, MD      . sodium chloride flush (NS) 0.9 % injection 3 mL  3 mL Intravenous Q12H Natalie Boston, MD      . sodium chloride flush (NS) 0.9 % injection 3 mL  3 mL Intravenous PRN Natalie Boston, MD      . traMADol Veatrice Bourbon) tablet 50-100 mg  50-100 mg Oral Q6H PRN Natalie Boston, MD      . vitamin C (ASCORBIC ACID) tablet 500  mg  500 mg Oral BID Natalie Boston, MD   500 mg at 05/16/16 2158     Allergies  Allergen Reactions  . Bisphosphonates     Unclear relation to meds, but pt had long bone fracture after treatment with fosamax  . Diclofenac Potassium      Not sure  . Naproxen Sodium     Not sure  . Sulfonamide Derivatives   . Ezetimibe-Simvastatin     myalgias  . Simvastatin     myalgias    Disposition: 01-Home or Self Care  Discharge Instructions    Call MD for:    Complete by:  As directed    Temperature > 101.40F   Call MD for:    Complete by:  As directed    Temperature > 101.40F   Call MD for:  extreme fatigue    Complete by:  As directed    Call MD for:  extreme fatigue    Complete by:  As directed    Call MD for:  hives    Complete by:  As directed    Call MD for:  hives    Complete by:  As directed    Call MD for:  persistant nausea and vomiting    Complete by:  As directed    Call MD for:  persistant nausea and vomiting    Complete by:  As directed    Call MD for:  redness, tenderness, or signs of infection (pain, swelling, redness, odor or green/yellow discharge around incision site)    Complete by:  As directed    Call MD for:  redness, tenderness, or signs of infection (pain, swelling, redness, odor or green/yellow discharge around incision site)    Complete by:  As directed    Call MD for:  severe uncontrolled pain    Complete by:  As directed    Call MD for:  severe uncontrolled pain    Complete by:  As directed    Diet - low sodium heart healthy    Complete by:  As directed    Start with bland, low residue diet for a few days, then advance to a heart healthy (low fat, high fiber) diet.  If you feel nauseated or constipated, simplify to a liquid only diet for 48 hours until you are feeling better (no more nausea, farting/passing gas, having a bowel movement, etc...).  If you cannot tolerate even drinking liquids, or feeling worse, let your surgeon know or go to the Emergency Department for help.   Diet - low sodium heart healthy    Complete by:  As directed    Start with bland, low residue diet for a few days, then advance to a heart healthy (low fat, high fiber) diet.  If you feel nauseated or constipated,  simplify to a liquid only diet for 48 hours until you are feeling better (no more nausea, farting/passing gas, having a bowel movement, etc...).  If you cannot tolerate even drinking liquids, or feeling worse, let your surgeon know or go to the Emergency Department for help.   Discharge instructions    Complete by:  As directed    Please see discharge instruction sheets.   Also refer to any handouts/printouts that may have been given from the [facility] surgery office (if you visited [place] there before surgery) Please call our office if you have any questions or concerns [phone]   Discharge instructions    Complete by:  As directed  Please see discharge instruction sheets.   Also refer to any handouts/printouts that may have been given from the [facility] surgery office (if you visited [place] there before surgery) Please call our office if you have any questions or concerns [phone]   Discharge wound care:    Complete by:  As directed    Yu have closed incisions: Shower and bathe over these incisions with soap and water every day.  You do not need to replace dressings over the closed incisions unless you feel more comfortable with a Band-Aid covering it.   Please call our office [phone] if you have further questions.   Discharge wound care:    Complete by:  As directed    If you have closed incisions: Shower and bathe over these incisions with soap and water every day.  It is OK to wash over the dressings: they are waterproof. Remove all surgical dressings on postoperative day #3.  You do not need to replace dressings over the closed incisions unless you feel more comfortable with a Band-Aid covering it.   If you have an open wound: That requires packing, so please see wound care instructions.   In general, remove all dressings, wash wound with soap and water and then replace with saline moistened gauze.  Do the dressing change at least every day.    Please call our office [phone] if  you have further questions.   Driving Restrictions    Complete by:  As directed    No driving until off narcotics and can safely swerve away without pain during an emergency   Driving Restrictions    Complete by:  As directed    No driving until off narcotics and can safely swerve away without pain during an emergency   Increase activity slowly    Complete by:  As directed    Increase activity slowly    Complete by:  As directed    Lifting restrictions    Complete by:  As directed    Avoid heavy lifting initially, <20 pounds at first.   Do not push through pain.   You have no specific weight limit: If it hurts to do, DON'T DO IT.    If you feel no pain, you are not injuring anything.  Pain will protect you from injury.   Coughing and sneezing are far more stressful to your incision than any lifting.   Avoid resuming heavy lifting (>50 pounds) or other intense activity until off all narcotic pain medications.   When want to exercise more, give yourself 2 weeks to gradually get back to full intense exercise/activity.   Lifting restrictions    Complete by:  As directed    Avoid heavy lifting initially, <20 pounds at first.   Do not push through pain.   You have no specific weight limit: If it hurts to do, DON'T DO IT.    If you feel no pain, you are not injuring anything.  Pain will protect you from injury.   Coughing and sneezing are far more stressful to your incision than any lifting.   Avoid resuming heavy lifting (>50 pounds) or other intense activity until off all narcotic pain medications.   When want to exercise more, give yourself 2 weeks to gradually get back to full intense exercise/activity.   May shower / Bathe    Complete by:  As directed    [place].  It is fine for dressings or wounds to be washed/rinsed.  Use  gentle soap & water.  This will help the incisions and/or wounds get clean & minimize infection.   May shower / Bathe    Complete by:  As directed     [place].  It is fine for dressings or wounds to be washed/rinsed.  Use gentle soap & water.  This will help the incisions and/or wounds get clean & minimize infection.   May walk up steps    Complete by:  As directed    May walk up steps    Complete by:  As directed    Sexual Activity Restrictions    Complete by:  As directed    Sexual activity as tolerated.  Do not push through pain.  Pain will protect you from injury.   Sexual Activity Restrictions    Complete by:  As directed    Sexual activity as tolerated.  Do not push through pain.  Pain will protect you from injury.   Walk with assistance    Complete by:  As directed    Walk over an hour a day.  May use a walker/cane/companion to help with balance and stamina.   Walk with assistance    Complete by:  As directed    Walk over an hour a day.  May use a walker/cane/companion to help with balance and stamina.       Medication List    STOP taking these medications   metroNIDAZOLE 500 MG tablet Commonly known as:  FLAGYL   neomycin 500 MG tablet Commonly known as:  MYCIFRADIN     TAKE these medications   ACCU-CHEK AVIVA PLUS test strip Generic drug:  glucose blood USE ONE STRIP TO CHECK GLUCOSE ONCE DAILY   ACCU-CHEK SOFTCLIX LANCETS lancets USE ONCE DAILY AS DIRECTED   amLODipine 5 MG tablet Commonly known as:  NORVASC TAKE ONE TABLET BY MOUTH ONCE DAILY   buPROPion 100 MG 12 hr tablet Commonly known as:  WELLBUTRIN SR TAKE ONE TABLET BY MOUTH ONCE DAILY   CALCIUM CITRATE PO Take 500 mg by mouth at bedtime.   cholecalciferol 1000 units tablet Commonly known as:  VITAMIN D Take 1,000 Units by mouth daily.   cyclobenzaprine 10 MG tablet Commonly known as:  FLEXERIL Take 0.5-1 tablets (5-10 mg total) by mouth 2 (two) times daily as needed for muscle spasms (sedation caution).   Fish Oil 1000 MG Caps Take 1,000 mg by mouth 2 (two) times daily.   letrozole 2.5 MG tablet Commonly known as:   FEMARA Take 1 tablet (2.5 mg total) by mouth daily.   LORazepam 2 MG tablet Commonly known as:  ATIVAN TAKE ONE TABLET BY MOUTH THREE TIMES DAILY AS NEEDED FOR ANXIETY AND TWO AT BEDTIME What changed:  See the new instructions.   metFORMIN 500 MG tablet Commonly known as:  GLUCOPHAGE Take 1-2 tablets (500-1,000 mg total) by mouth 2 (two) times daily with a meal. What changed:  how much to take   metoprolol 50 MG tablet Commonly known as:  LOPRESSOR TAKE ONE TABLET BY MOUTH TWICE DAILY   omeprazole 20 MG capsule Commonly known as:  PRILOSEC Take 1 capsule (20 mg total) by mouth 2 (two) times daily before a meal.   potassium chloride SA 20 MEQ tablet Commonly known as:  K-DUR,KLOR-CON Take 2 tablets (40 mEq total) by mouth daily.   sertraline 100 MG tablet Commonly known as:  ZOLOFT TAKE ONE AND ONE-HALF TABLETS BY MOUTH ONCE DAILY What changed:  See the new  instructions.   traMADol 50 MG tablet Commonly known as:  ULTRAM Take 1-2 tablets (50-100 mg total) by mouth every 6 (six) hours as needed for moderate pain or severe pain.      Follow-up Information    Babetta Paterson C., MD. Schedule an appointment as soon as possible for a visit in 2 weeks.   Specialty:  General Surgery Why:  To follow up after your operation, To follow up after your hospital stay Contact information: [place] [address] [phone]        Elsie Stain, MD. Schedule an appointment as soon as possible for a visit in 2 weeks.   Specialty:  Family Medicine Why:  To follow up after your hospital stay.  Follow up on diabetes & glucose Contact information: [place] [address] [phone]            Signed: Morton Peters, [name], [name] Gastrointestinal and Minimally Invasive Surgery [facility] [address] [place], [place] [address] [phone] Main /  Paging   05/18/2016, 7:45 AM

## 2016-05-18 NOTE — Progress Notes (Signed)
Pt tolerating diet urinating ambulating , passing flatus and pain controlled with oral meds. Desires discharge. Discharge instructions discussed until no further questions ask.

## 2016-05-18 NOTE — Progress Notes (Signed)
[address] [place], [place] [address] Phone: [phone] FAX: [phone]   [name] [id number] August 21, 1940    Problem List:   Principal Problem:   Colovaginal fistula s/p LAR rectosigmoid resection & colovaginal fistula repair/omentopexy 05/14/2016 Active Problems:   DM (diabetes mellitus) ([place])   Anxiety state   GERD   Insomnia   Hypertension   Hypokalemia   Hypomagnesemia   4 Days Post-Op  05/14/2016  POST-OPERATIVE DIAGNOSIS:  COLOVAGINAL FISTULA  PROCEDURE:   XI ROBOT ASSISTED LOW ANTERIOR RESECTION OF RECTOSIGMOID COLON  REPAIR OF COLOVAGINAL FISTULA  OMENTOPEXY RIGID PROCTOSCOPY  SURGEON:  Natalie Boston, [name]  OR FINDINGS:   Patient had inflamed rectosigmoid colon with distal sigmoid colon densely adherent to the left posterior lateral vaginal cuff.  This was the location of the COLOVAGINAL fistula.  No bladder involvement.  Vaginal pair is a primary repair with serrated 2-0 V lock with an omentopexy over the repair  The anastomosis rests 12 cm from the anal verge by rigid proctoscopy.   Assessment  Ileus resolving  Plan:  -adv solid diet -d/c drain -low K & Mag - corrected -wean off IVF - PRN boluses -check labs - had hypokalemia -DM - glc control - stop D5.  Continue metformin & SSI -HTN control - metoprolol & PRN backup -VTE prophylaxis- SCDs, etc -mobilize as tolerated to help recovery  D/C patient from hospital when patient meets criteria (anticipate later today):  Tolerating oral intake well Ambulating well Adequate pain control without IV medications Urinating  Having flatus Disposition planning in place   I updated the patient's status to the patient, nurse, and family.  Recommendations were made.  Questions were answered.  They expressed understanding & appreciation.   [name], [name], [name] Gastrointestinal and Minimally Invasive Surgery [facility] [address] [name], [name] [address] [phone] Main / Paging   05/18/2016  CARE TEAM:  PCP: [name]  Outpatient Care Team: Patient Care Team: [name] as PCP - General Natalie Boston, [name] as Consulting Physician (General Surgery) [name] as Consulting Physician (Obstetrics and Gynecology) [name] as Consulting Physician (Gastroenterology)  Inpatient Treatment Team: Treatment Team: Attending Provider: Michael Boston, [name]; Technician: [name]; Technician: [name]; Technician: [name]  Subjective:  Tol fulls Low K & Mag - replaced Daughter in room Walking well in hallways No nausea now "Can I go home?"  Objective:  Vital signs:  Vitals:   05/16/16 2137 05/17/16 0610 05/17/16 2127 05/18/16 0522  BP: (!) 173/57 (!) 188/74 (!) 156/67 (!) 174/64  Pulse: 63 60 69 65  Resp: 16 14 16 16   Temp: 97.8 F (36.6 C) 98.1 F (36.7 C) 98.4 F (36.9 C) 97.7 F (36.5 C)  TempSrc: Oral Oral Oral Oral  SpO2: 97% 96% 98% 96%  Weight:  60.1 kg (132 lb 7.9 oz)  57.7 kg (127 lb 3.3 oz)  Height:        Last BM Date: 05/17/16  Intake/Output   Yesterday:  12/04 0701 - 12/05 0700 In: 1330 [P.O.:240; I.V.:825; IV Piggyback:265] Out: 340 [Urine:250; Drains:90] This shift:  No intake/output data recorded.  Bowel function:  Flatus: YES  BM:  YES  Drain: Serosanguinous   Physical Exam:  General: Pt awake/alert/oriented x4 in No acute distress.  Smiling. Eyes: PERRL, normal EOM.  Sclera clear.  No  icterus Neuro: CN II-XII intact w/o focal sensory/motor deficits. Lymph: No head/neck/groin lymphadenopathy Psych:  No delerium/psychosis/paranoia HENT: Normocephalic, Mucus membranes moist.  No thrush Neck: Supple, No tracheal deviation Chest: No chest wall pain w good excursion CV:  Pulses intact.  Regular rhythm MS: Normal AROM mjr joints.  No obvious  deformity Abdomen: Soft.  Nondistended.  Nontender.  No evidence of peritonitis.  Incisions closed & c/d/i  No incarcerated hernias. Gen:  No inguinal hernias.  No vaginal bleeding/discharge Ext:  SCDs BLE.  No mjr edema.  No cyanosis Skin: No petechiae / purpura  Results:   PATHOLOGY Diagnosis 1. Colon, segmental resection, rectosigmoid - BENIGN COLON WITH FULL THICKNESS DEFECT WITH ASSOCIATED ACUTE INFLAMMATION, CLINICALLY FISTULA. - DIVERTICULOSIS. - NO DYSPLASIA OR MALIGNANCY. 2. Colon, resection margin (donut), final distal - BENIGN COLONIC MUCOSA. - NO DYSPLASIA OR MALIGNANCY. [name], Electronic Signature (Case signed 05/17/2016) Specimen [name] and Clinical Information Specimen(s) Obtained: 1. Colon, segmental resection, rectosigmoid 2. Colon, resection margin (donut), final distal Specimen Clinical Information 1. colovesical fistula [rd] [name] 1. The specimen is received in formalin and consists of an unoriented length of colon with one open and one stapled resection margin. The specimen measures 16.0 cm in length and per the requisition the open end is proximal. The serosal surface is gray purple, hyperemic with moderate adhesions. There is a 3.5 x 2.0 cm area of indurated adipose tissue cautery artifact. The area is inked black, and a transmural defect is identified. The lumen is filled with a small amount of tan brown fecal material. The mucosa is tan pink with normal folding, and multiple diverticuli are identified. The wall ranges from 0.3 to 0.8 cm in thickness. No enlarged lymph nodes are grossly identified. Representative sections are submitted in four cassettes. A = proximal resection margin B = distal resection margins inked C = full thickness defect. D = diverticulum. 1 of 2 FINAL for [name] ([id number]) [name](continued) 2. The specimen is received in formalin and consists of a circular portion of tan brown, hyperemic mucosa  measuring 1.7 x 1.6 cm in diameter and up to 0.7 cm in length. Representative sections are submitted in one cassette. (KL:kh 05-17-16) Report signed out from the following location(s) Technical component and interpretation was performed at [facility] [place], [place], [place] [address]. CLIA #: [id number], 2 of  Labs: Results for orders placed or performed during the hospital encounter of 05/14/16 (from the past 48 hour(s))  Glucose, capillary     Status: Abnormal   Collection Time: 05/16/16 11:59 AM  Result Value Ref Range   Glucose-Capillary 192 (H) 65 - 99 mg/dL   Comment 1 Notify RN    Comment 2 Document in Chart   Glucose, capillary     Status: Abnormal   Collection Time: 05/16/16  5:06 PM  Result Value Ref Range   Glucose-Capillary 194 (H) 65 - 99 mg/dL   Comment 1 Notify RN    Comment 2 Document in Chart   Glucose, capillary     Status: Abnormal   Collection Time: 05/16/16  9:32 PM  Result Value Ref Range   Glucose-Capillary 233 (H) 65 - 99 mg/dL  Glucose, capillary     Status: Abnormal   Collection Time: 05/17/16  7:28 AM  Result Value Ref Range   Glucose-Capillary 193 (H) 65 - 99 mg/dL  Basic metabolic panel     Status: Abnormal   Collection Time: 05/17/16  8:05 AM  Result  Value Ref Range   Sodium 137 135 - 145 mmol/L   Potassium 2.8 (L) 3.5 - 5.1 mmol/L   Chloride 99 (L) 101 - 111 mmol/L   CO2 28 22 - 32 mmol/L   Glucose, Bld 290 (H) 65 - 99 mg/dL   BUN <5 (L) 6 - 20 mg/dL   Creatinine, Ser 0.50 0.44 - 1.00 mg/dL   Calcium 9.2 8.9 - 10.3 mg/dL   GFR calc non Af Amer >60 >60 mL/min   GFR calc Af Amer >60 >60 mL/min    Comment: (NOTE) The eGFR has been calculated using the CKD EPI equation. This calculation has not been validated in all clinical situations. eGFR's persistently <60 mL/min signify possible Chronic Kidney Disease.    Anion gap 10 5 - 15  Magnesium     Status: Abnormal   Collection Time: 05/17/16  8:05 AM  Result Value  Ref Range   Magnesium 1.2 (L) 1.7 - 2.4 mg/dL  CBC     Status: None   Collection Time: 05/17/16  8:05 AM  Result Value Ref Range   WBC 8.6 4.0 - 10.5 K/uL   RBC 4.27 3.87 - 5.11 MIL/uL   Hemoglobin 12.6 12.0 - 15.0 g/dL   HCT 37.0 36.0 - 46.0 %   MCV 86.7 78.0 - 100.0 fL   MCH 29.5 26.0 - 34.0 pg   MCHC 34.1 30.0 - 36.0 g/dL   RDW 13.9 11.5 - 15.5 %   Platelets 280 150 - 400 K/uL  Glucose, capillary     Status: Abnormal   Collection Time: 05/17/16 11:56 AM  Result Value Ref Range   Glucose-Capillary 171 (H) 65 - 99 mg/dL  Glucose, capillary     Status: Abnormal   Collection Time: 05/17/16  4:48 PM  Result Value Ref Range   Glucose-Capillary 179 (H) 65 - 99 mg/dL  Glucose, capillary     Status: Abnormal   Collection Time: 05/17/16  9:25 PM  Result Value Ref Range   Glucose-Capillary 228 (H) 65 - 99 mg/dL    Imaging / Studies: No results found.  Medications / Allergies: per chart  Antibiotics: Anti-infectives    Start     Dose/Rate Route Frequency Ordered Stop   05/14/16 2200  metroNIDAZOLE (FLAGYL) tablet 1,000 mg  Status:  Discontinued     1,000 mg Oral 3 times daily 05/14/16 1714 05/14/16 1741   05/14/16 2200  neomycin (MYCIFRADIN) tablet 1,000 mg  Status:  Discontinued     1,000 mg Oral 3 times daily 05/14/16 1714 05/14/16 1741   05/14/16 2200  cefoTEtan (CEFOTAN) 2 g in dextrose 5 % 50 mL IVPB     2 g 100 mL/hr over 30 Minutes Intravenous Every 12 hours 05/14/16 1714 05/14/16 2226   05/14/16 0846  cefoTEtan (CEFOTAN) 2 g in dextrose 5 % 50 mL IVPB     2 g 100 mL/hr over 30 Minutes Intravenous On call to O.R. 05/14/16 0846 05/14/16 1135   05/14/16 0600  clindamycin (CLEOCIN) 900 mg, gentamicin (GARAMYCIN) 240 mg in sodium chloride 0.9 % 1,000 mL for intraperitoneal lavage  Status:  Discontinued      Intraperitoneal To Surgery 05/13/16 1300 05/14/16 1659        Note: Portions of this report may have been transcribed using voice recognition software. Every effort  was made to ensure accuracy; however, inadvertent computerized transcription errors may be present.   Any transcriptional errors that result from this process are unintentional.  [name], [name], [name] Gastrointestinal and Minimally Invasive Surgery [facility] [address], [place], [place] [address] [phone] Main / Paging   05/18/2016

## 2016-05-19 ENCOUNTER — Telehealth: Payer: Self-pay | Admitting: *Deleted

## 2016-05-19 NOTE — Telephone Encounter (Signed)
Called patient and left message to return call

## 2016-05-20 ENCOUNTER — Telehealth: Payer: Self-pay

## 2016-05-20 NOTE — Telephone Encounter (Signed)
Transition Care Management Follow-up Telephone Call    Date discharged? 05/18/2016  How have you been since you were released from the hospital? [place]. Still experiencing soreness related to surgery.   Any patient concerns? None at this time.    Items Reviewed:  Medications reviewed: Yes  Allergies reviewed: Yes  Dietary changes reviewed: N/A  Referrals reviewed: Yes   Functional Questionnaire:  Independent - I Dependent - D    Activities of Daily Living (ADLs):    Personal hygiene - I Dressing - I Eating - I Maintaining continence - I Transferring - I   Independent Activities of Daily Living (iADLs): Basic communication skills - I Transportation - D (pt does not drive) Meal preparation  - D Shopping - D Housework - D Managing medications - I Managing personal finances - I   Confirmed importance and date/time of follow-up visits scheduled YES  Provider Appointment booked with PCP 05/28/16 @ 1500  Confirmed with patient if condition begins to worsen call PCP or go to the ER.  Patient was given the office number and encouraged to call back with question or concerns: YES

## 2016-05-28 ENCOUNTER — Ambulatory Visit (INDEPENDENT_AMBULATORY_CARE_PROVIDER_SITE_OTHER): Payer: Medicare Other | Admitting: Family Medicine

## 2016-05-28 ENCOUNTER — Encounter: Payer: Self-pay | Admitting: Family Medicine

## 2016-05-28 VITALS — BP 130/76 | HR 62 | Temp 97.8°F | Wt 131.0 lb

## 2016-05-28 DIAGNOSIS — R790 Abnormal level of blood mineral: Secondary | ICD-10-CM | POA: Diagnosis not present

## 2016-05-28 DIAGNOSIS — F411 Generalized anxiety disorder: Secondary | ICD-10-CM

## 2016-05-28 DIAGNOSIS — E119 Type 2 diabetes mellitus without complications: Secondary | ICD-10-CM

## 2016-05-28 DIAGNOSIS — E876 Hypokalemia: Secondary | ICD-10-CM | POA: Diagnosis not present

## 2016-05-28 DIAGNOSIS — Z23 Encounter for immunization: Secondary | ICD-10-CM | POA: Diagnosis not present

## 2016-05-28 DIAGNOSIS — N824 Other female intestinal-genital tract fistulae: Secondary | ICD-10-CM | POA: Diagnosis not present

## 2016-05-28 MED ORDER — METFORMIN HCL 500 MG PO TABS: 1000.0000 mg | ORAL_TABLET | Freq: Two times a day (BID) | ORAL | Status: DC

## 2016-05-28 MED ORDER — SERTRALINE HCL 100 MG PO TABS: ORAL_TABLET | ORAL | Status: DC

## 2016-05-28 MED ORDER — LORAZEPAM 2 MG PO TABS: ORAL_TABLET | ORAL | Status: DC

## 2016-05-28 MED ORDER — POLYETHYLENE GLYCOL 3350 17 G PO PACK: 17.0000 g | PACK | Freq: Every day | ORAL | 1 refills | Status: DC

## 2016-05-28 NOTE — Patient Instructions (Addendum)
Go to the lab on the way out.  We'll contact you with your lab report. Add fibercon if you can tolerate it.  Use miralax in the meantime if needed.   Recheck in March with labs either before or at the visit.  Update me sooner if needed.  Take care.  Glad to see you.

## 2016-05-28 NOTE — Progress Notes (Signed)
Pre visit review using our clinic review tool, if applicable. No additional management support is needed unless otherwise documented below in the visit note. 

## 2016-05-28 NOTE — Progress Notes (Signed)
Admit date: 05/14/2016 Discharge date: 05/18/2016  Patient Care Team: Tonia Ghent, MD as PCP - General Michael Boston, MD as Consulting Physician (General Surgery) Alden Hipp, MD as Consulting Physician (Obstetrics and Gynecology) Irene Shipper, MD as Consulting Physician (Gastroenterology)  Admission Diagnoses: Principal Problem:   Colovaginal fistula s/p LAR rectosigmoid resection & colovaginal fistula repair/omentopexy 05/14/2016 Active Problems:   DM (diabetes mellitus) ()   Anxiety state   GERD   Insomnia   Hypertension   Hypokalemia   Hypomagnesemia   Discharge Diagnoses:  Principal Problem:   Colovaginal fistula s/p LAR rectosigmoid resection & colovaginal fistula repair/omentopexy 05/14/2016 Active Problems:   DM (diabetes mellitus) ([place])   Anxiety state   GERD   Insomnia   Hypertension   Hypokalemia   Hypomagnesemia   SURGERY:  05/14/2016  POST-OPERATIVE DIAGNOSIS: COLOVAGINAL FISTULA  PROCEDURE:  XI ROBOT ASSISTED LOW ANTERIOR RESECTION OF RECTOSIGMOID COLON  REPAIR OF COLOVAGINAL FISTULA  OMENTOPEXY RIGIDPROCTOSCOPY  SURGEON: Michael Boston, MD  OR FINDINGS:  Patient had inflamed rectosigmoid colon with distal sigmoid colon densely adherent to the left posterior lateral vaginal cuff. This was the location of the [place]. No bladder involvement.  Vaginal pair is a primary repair with serrated 2-0 V lock with an omentopexy over the repair  The anastomosis rests 12cm from the anal verge by rigid proctoscopy.  PATHOLOGY Diagnosis 1. Colon, segmental resection, rectosigmoid - BENIGN COLON WITH FULL THICKNESS DEFECT WITH ASSOCIATED ACUTE INFLAMMATION, CLINICALLY FISTULA. - DIVERTICULOSIS. - NO DYSPLASIA OR MALIGNANCY. 2. Colon, resection margin (donut), final distal - BENIGN COLONIC MUCOSA. - NO DYSPLASIA OR MALIGNANCY. [name] Pathologist, Electronic Signature (Case signed 05/17/2016) Specimen Gross and  Clinical Information Specimen(s) Obtained: 1. Colon, segmental resection, rectosigmoid 2. Colon, resection margin (donut), final distal Specimen Clinical Information 1. colovesical fistula [rd] Gross 1. The specimen is received in formalin and consists of an unoriented length of colon with one open and one stapled resection margin. The specimen measures 16.0 cm in length and per the requisition the open end is proximal. The serosal surface is gray purple, hyperemic with moderate adhesions. There is a 3.5 x 2.0 cm area of indurated adipose tissue cautery artifact. The area is inked black, and a transmural defect is identified. The lumen is filled with a small amount of tan brown fecal material. The mucosa is tan pink with normal folding, and multiple diverticuli are identified. The wall ranges from 0.3 to 0.8 cm in thickness. No enlarged lymph nodes are grossly identified. Representative sections are submitted in four cassettes. A = proximal resection margin B = distal resection margins inked C = full thickness defect. D = diverticulum. 1 of 2 FINAL for LE, INGS [id number]) Gross(continued) 2. The specimen is received in formalin and consists of a circular portion of tan brown, hyperemic mucosa measuring 1.7 x 1.6 cm in diameter and up to 0.7 cm in length. Representative sections are submitted in one cassette. (KL:kh 05-17-16) Report signed out from the following location(s) Technical component and interpretation was performed at [facility] [place], [place], [place] [address]. CLIA #: [id number], 2 of  Consults: None  Hospital Course:   The patient underwent the surgery above.  Postoperatively, the patient gradually mobilized and advanced to a solid diet.  Pain and other symptoms were treated aggressively.  Low Mag & K - replaced.  Glucose monitored & treated for her DM.  By the time of discharge, the patient was walking well the hallways,  eating food, having flatus.  Pain was well-controlled on an oral medications.  Based on meeting discharge criteria and continuing to recover, I felt it was safe for the patient to be discharged from the hospital to further recover with close followup. Postoperative recommendations were discussed in detail.  They are written as well.  ----------------------------------  Fistula repair. Pain controlled today, with some soreness.  No complications known. Prev with some pain on the R flank after an active day.  No blood in stool.  Has BMs in the meantime but needing miralax.  Previous Hospital course, above, discussed with patient.  DM2.  Sugar improved in the meantime.  120-160s in the AM generally now.  Last A1c 8.  D/w pt.  No low sugars.    She was anxious in the hospital.  That is some better in the meantime.    Low K and mag prev noted.  Discussed with patient. Follow-up labs pending. See notes on labs.  PMH and SH reviewed  ROS: Per HPI unless specifically indicated in ROS section   Meds, vitals, and allergies reviewed.   GEN: nad, alert and oriented HEENT: mucous membranes moist NECK: supple w/o LA CV: rrr.  PULM: ctab, no inc wob ABD: soft, +bs, surgical scars healing well. None appear infected. EXT: no edema SKIN: no acute rash

## 2016-05-29 LAB — MAGNESIUM: Magnesium: 1.3 mg/dL — ABNORMAL LOW (ref 1.5–2.5)

## 2016-05-29 LAB — POTASSIUM: POTASSIUM: 4.1 mmol/L (ref 3.5–5.3)

## 2016-05-30 NOTE — Assessment & Plan Note (Signed)
Continue current medications. She agrees. 

## 2016-05-30 NOTE — Assessment & Plan Note (Signed)
Sugar improved in the meantime since she has been home. No adverse effect on medications. Overall A1c improved if not yet controlled. We can recheck later on.

## 2016-05-30 NOTE — Assessment & Plan Note (Signed)
Surgical sites healing. No complications at this point. Doing well. She is advance her diet. Discussed with patient about MiraLAX versus fiber supplement. See after visit summary. Gradually increase her activity. I asked her to show good judgment about driving. Gradually return to driving. Discuss.

## 2016-05-30 NOTE — Assessment & Plan Note (Signed)
See notes on follow-up labs. 

## 2016-05-30 NOTE — Assessment & Plan Note (Signed)
See notes on follow-up labs. I expect her electrolyte abnormalities to improve with a regular diet. Discussed with patient. She agrees.

## 2016-05-31 ENCOUNTER — Other Ambulatory Visit: Payer: Self-pay | Admitting: Family Medicine

## 2016-06-02 ENCOUNTER — Other Ambulatory Visit: Payer: Self-pay | Admitting: Family Medicine

## 2016-06-02 ENCOUNTER — Encounter: Payer: Self-pay | Admitting: Family Medicine

## 2016-06-02 MED ORDER — ONDANSETRON HCL 4 MG PO TABS: 4.0000 mg | ORAL_TABLET | Freq: Three times a day (TID) | ORAL | 0 refills | Status: DC | PRN

## 2016-06-09 ENCOUNTER — Other Ambulatory Visit: Payer: Self-pay

## 2016-06-09 NOTE — Telephone Encounter (Signed)
Patient calls in requesting a refill on her Lorazepam be sent to [facility].  She would like a return call if this can be done. Patient says her pharmacy told her to call today because she could probably get it filled sooner than they could.     Please review pending r/x and advise if okay to fill or if changes should be made.

## 2016-06-10 MED ORDER — LORAZEPAM 2 MG PO TABS: ORAL_TABLET | ORAL | 2 refills | Status: DC

## 2016-06-10 NOTE — Telephone Encounter (Signed)
Medication phoned to pharmacy.  

## 2016-06-10 NOTE — Telephone Encounter (Signed)
Please call in.  Thanks.   

## 2016-06-26 ENCOUNTER — Other Ambulatory Visit: Payer: Self-pay | Admitting: Family Medicine

## 2016-07-05 DIAGNOSIS — H40033 Anatomical narrow angle, bilateral: Secondary | ICD-10-CM | POA: Diagnosis not present

## 2016-07-05 DIAGNOSIS — E119 Type 2 diabetes mellitus without complications: Secondary | ICD-10-CM | POA: Diagnosis not present

## 2016-07-05 LAB — HM DIABETES EYE EXAM

## 2016-07-08 ENCOUNTER — Encounter: Payer: Self-pay | Admitting: Family Medicine

## 2016-08-05 ENCOUNTER — Telehealth: Payer: Self-pay | Admitting: Oncology

## 2016-08-05 NOTE — Telephone Encounter (Signed)
Left a message on patient's VM about the appointment change from 3/7 > 3/21

## 2016-08-18 ENCOUNTER — Other Ambulatory Visit: Payer: Medicare Other

## 2016-08-18 ENCOUNTER — Ambulatory Visit: Payer: Medicare Other | Admitting: Oncology

## 2016-08-19 ENCOUNTER — Telehealth: Payer: Self-pay | Admitting: Family Medicine

## 2016-08-19 NOTE — Telephone Encounter (Signed)
Left pt message asking to call Allison back directly at [phone] to schedule AWV.+ labs with Lesia and CPE with PCP. °

## 2016-08-25 ENCOUNTER — Other Ambulatory Visit: Payer: Medicare Other

## 2016-08-27 ENCOUNTER — Ambulatory Visit: Payer: Medicare Other | Admitting: Family Medicine

## 2016-09-01 ENCOUNTER — Other Ambulatory Visit: Payer: Medicare Other

## 2016-09-01 ENCOUNTER — Encounter: Payer: Medicare Other | Admitting: Oncology

## 2016-09-05 ENCOUNTER — Other Ambulatory Visit: Payer: Self-pay | Admitting: Family Medicine

## 2016-09-05 DIAGNOSIS — M810 Age-related osteoporosis without current pathological fracture: Secondary | ICD-10-CM

## 2016-09-05 DIAGNOSIS — E119 Type 2 diabetes mellitus without complications: Secondary | ICD-10-CM

## 2016-09-09 ENCOUNTER — Ambulatory Visit: Payer: Medicare Other | Admitting: Family Medicine

## 2016-09-09 ENCOUNTER — Ambulatory Visit: Payer: Medicare Other

## 2016-09-11 DIAGNOSIS — M25562 Pain in left knee: Secondary | ICD-10-CM | POA: Diagnosis not present

## 2016-09-11 DIAGNOSIS — M1712 Unilateral primary osteoarthritis, left knee: Secondary | ICD-10-CM | POA: Diagnosis not present

## 2016-09-13 ENCOUNTER — Other Ambulatory Visit: Payer: Self-pay | Admitting: Family Medicine

## 2016-09-13 NOTE — Telephone Encounter (Signed)
Received refill electronically Last refill 06/10/16 #150/2 Last office visit 05/28/16

## 2016-09-14 NOTE — Telephone Encounter (Signed)
Please call in.  Thanks.   

## 2016-09-14 NOTE — Telephone Encounter (Signed)
Rx called to pharmacy as instructed. 

## 2016-09-15 ENCOUNTER — Other Ambulatory Visit (INDEPENDENT_AMBULATORY_CARE_PROVIDER_SITE_OTHER): Payer: Medicare Other

## 2016-09-15 DIAGNOSIS — M810 Age-related osteoporosis without current pathological fracture: Secondary | ICD-10-CM

## 2016-09-15 DIAGNOSIS — E119 Type 2 diabetes mellitus without complications: Secondary | ICD-10-CM

## 2016-09-15 LAB — HEMOGLOBIN A1C: HEMOGLOBIN A1C: 8.4 % — AB (ref 4.6–6.5)

## 2016-09-15 LAB — VITAMIN D 25 HYDROXY (VIT D DEFICIENCY, FRACTURES): VITD: 44.26 ng/mL (ref 30.00–100.00)

## 2016-09-15 LAB — LIPID PANEL
CHOLESTEROL: 191 mg/dL (ref 0–200)
HDL: 30 mg/dL — ABNORMAL LOW (ref >39.00)
LDL Cholesterol: 127 mg/dL — ABNORMAL HIGH (ref 0–99)
NonHDL: 160.79
TRIGLYCERIDES: 169 mg/dL — AB (ref 0.0–149.0)
Total CHOL/HDL Ratio: 6
VLDL: 33.8 mg/dL (ref 0.0–40.0)

## 2016-09-21 ENCOUNTER — Encounter: Payer: Self-pay | Admitting: Family Medicine

## 2016-09-22 ENCOUNTER — Other Ambulatory Visit: Payer: Self-pay | Admitting: Family Medicine

## 2016-09-22 MED ORDER — CYCLOBENZAPRINE HCL 10 MG PO TABS: 5.0000 mg | ORAL_TABLET | Freq: Two times a day (BID) | ORAL | 1 refills | Status: DC | PRN

## 2016-09-28 ENCOUNTER — Ambulatory Visit (INDEPENDENT_AMBULATORY_CARE_PROVIDER_SITE_OTHER): Payer: Medicare Other | Admitting: Family Medicine

## 2016-09-28 ENCOUNTER — Encounter: Payer: Self-pay | Admitting: Family Medicine

## 2016-09-28 ENCOUNTER — Ambulatory Visit (INDEPENDENT_AMBULATORY_CARE_PROVIDER_SITE_OTHER): Payer: Medicare Other

## 2016-09-28 VITALS — BP 120/68 | HR 61 | Temp 98.5°F | Ht 59.75 in | Wt 132.8 lb

## 2016-09-28 DIAGNOSIS — Z0000 Encounter for general adult medical examination without abnormal findings: Secondary | ICD-10-CM

## 2016-09-28 DIAGNOSIS — E119 Type 2 diabetes mellitus without complications: Secondary | ICD-10-CM

## 2016-09-28 DIAGNOSIS — F411 Generalized anxiety disorder: Secondary | ICD-10-CM

## 2016-09-28 DIAGNOSIS — E785 Hyperlipidemia, unspecified: Secondary | ICD-10-CM | POA: Diagnosis not present

## 2016-09-28 DIAGNOSIS — G4700 Insomnia, unspecified: Secondary | ICD-10-CM | POA: Diagnosis not present

## 2016-09-28 DIAGNOSIS — M25562 Pain in left knee: Secondary | ICD-10-CM

## 2016-09-28 DIAGNOSIS — Z7189 Other specified counseling: Secondary | ICD-10-CM | POA: Diagnosis not present

## 2016-09-28 DIAGNOSIS — C50912 Malignant neoplasm of unspecified site of left female breast: Secondary | ICD-10-CM

## 2016-09-28 LAB — MICROALBUMIN / CREATININE URINE RATIO
Creatinine,U: 125.2 mg/dL
Microalb Creat Ratio: 1 mg/g (ref 0.0–30.0)
Microalb, Ur: 1.2 mg/dL (ref 0.0–1.9)

## 2016-09-28 MED ORDER — TRAMADOL HCL 50 MG PO TABS: 50.0000 mg | ORAL_TABLET | Freq: Two times a day (BID) | ORAL | 1 refills | Status: DC | PRN

## 2016-09-28 NOTE — Progress Notes (Signed)
Pre visit review using our clinic review tool, if applicable. No additional management support is needed unless otherwise documented below in the visit note. 

## 2016-09-28 NOTE — Progress Notes (Signed)
Subjective:   [name] is a 76 y.o. female who presents for Medicare Annual (Subsequent) preventive examination.  Review of Systems:  N/A Cardiac Risk Factors include: advanced age (>75mn, >>20women);diabetes mellitus;hypertension     Objective:     Vitals: BP 120/68 (BP Location: Right Arm, Patient Position: Sitting, Cuff Size: Normal)   Pulse 61   Temp 98.5 F (36.9 C) (Oral)   Ht 4' 11.75" (1.518 m) Comment: no shoes  Wt 132 lb 12 oz (60.2 kg)   SpO2 97%   BMI 26.14 kg/m   Body mass index is 26.14 kg/m.   Tobacco History  Smoking Status  . Never Smoker  Smokeless Tobacco  . Never Used     Counseling given: No   Past Medical History:  Diagnosis Date  . Anemia   . Anxiety   . Blood transfusion   . Blood transfusion without reported diagnosis 1983   prbc  . Breast cancer ([facility] 03/01/12   left breast lumpectomy=invasive ductal ca,grade I/III,INVILVES SKELATAL MUSCLE   er=+,pr=NEG. HER2 NEG.  . Cancer ([facility] 08/09/11 DX    LEFT  . Depression   . Diabetes mellitus 2011   type II, on pills   . Diverticulosis   . Dyspnea    with exertion if walks a long ways per patient   . GERD (gastroesophageal reflux disease)   . Headache   . History of radiation therapy    RIGHT JAW 5 TXS FOR ABSESSED TOOTH IN HER 30'S   . History of radiation therapy 04/10/12-05/26/12   left breast  . Hyperlipidemia   . Hypertension   . Insomnia, unspecified   . Osteoporosis, unspecified    with prev fosamax treatment  . Other abnormal glucose   . PONV (postoperative nausea and vomiting)    Past Surgical History:  Procedure Laterality Date  . ABDOMINAL HYSTERECTOMY  1983   b/ls alpingooph orectomy 4 years later  . BILATERAL OOPHORECTOMY  1987  . BREAST LUMPECTOMY  03/01/12   LEFT=INVASIVE DUCTAL CA,gRADE i/iii,INVASION AT PSTERIOR RESECTION MARGIN AND INVLOVES SKE;ATAL MUSCLE,PERINEURAL INVASION IDENTIFIED  . INGUINAL HERNIA REPAIR Left 1999   LIH  . ORIF FEMUR  FRACTURE Right    titanium rod  . SIGMOIDOSCOPY    . TUBAL LIGATION  1975   Family History  Problem Relation Age of Onset  . Diabetes Mother   . Alcohol abuse Father   . Diabetes Sister   . Bladder Cancer Brother   . Diabetes Sister   . Bladder Cancer Son   . Colon cancer Neg Hx   . Esophageal cancer Neg Hx   . Pancreatic cancer Neg Hx   . Rectal cancer Neg Hx   . Stomach cancer Neg Hx    History  Sexual Activity  . Sexual activity: No    Comment: gxp2, pregnancy 1st child age 89 2 step children, no hrt    Outpatient Encounter Prescriptions as of 09/28/2016  Medication Sig  . ACCU-CHEK AVIVA PLUS test strip USE ONE STRIP TO CHECK GLUCOSE ONCE DAILY  . ACCU-CHEK SOFTCLIX LANCETS lancets USE ONCE DAILY AS DIRECTED  . amLODipine (NORVASC) 5 MG tablet TAKE ONE TABLET BY MOUTH ONCE DAILY  . buPROPion (WELLBUTRIN SR) 100 MG 12 hr tablet TAKE ONE TABLET BY MOUTH ONCE DAILY  . CALCIUM CITRATE PO Take 500 mg by mouth at bedtime.   . cholecalciferol (VITAMIN D) 1000 UNITS tablet Take 1,000 Units by mouth daily.  . cyclobenzaprine (FLEXERIL) 10 MG tablet  Take 0.5-1 tablets (5-10 mg total) by mouth 2 (two) times daily as needed for muscle spasms (sedation caution).  Marland Kitchen letrozole (FEMARA) 2.5 MG tablet Take 1 tablet (2.5 mg total) by mouth daily.  Marland Kitchen LORazepam (ATIVAN) 2 MG tablet TAKE ONE TABLET BY MOUTH THREE TIMES DAILY AS NEEDED FOR ANXIETY TAKE ONE OR TWO TABLETS AT BEDTIME  . metFORMIN (GLUCOPHAGE) 500 MG tablet Take 2 tablets (1,000 mg total) by mouth 2 (two) times daily with a meal.  . metoprolol (LOPRESSOR) 50 MG tablet TAKE ONE TABLET BY MOUTH TWICE DAILY  . Omega-3 Fatty Acids (FISH OIL) 1000 MG CAPS Take 1,000 mg by mouth 2 (two) times daily.   Marland Kitchen omeprazole (PRILOSEC) 20 MG capsule Take 1 capsule (20 mg total) by mouth 2 (two) times daily before a meal.  . ondansetron (ZOFRAN) 4 MG tablet Take 1 tablet (4 mg total) by mouth every 8 (eight) hours as needed for nausea or vomiting.    . polyethylene glycol (MIRALAX / GLYCOLAX) packet Take 17 g by mouth daily.  . sertraline (ZOLOFT) 100 MG tablet TAKE 1 TABLET(100 MG)  BY MOUTH IN THE MORNING  . traMADol (ULTRAM) 50 MG tablet Take 1-2 tablets (50-100 mg total) by mouth every 6 (six) hours as needed for moderate pain or severe pain.  . [DISCONTINUED] metoprolol (LOPRESSOR) 50 MG tablet TAKE ONE TABLET BY MOUTH TWICE DAILY   No facility-administered encounter medications on file as of 09/28/2016.     Activities of Daily Living In your present state of health, do you have any difficulty performing the following activities: 09/28/2016 05/14/2016  Hearing? N N  Vision? N N  Difficulty concentrating or making decisions? N N  Walking or climbing stairs? N N  Dressing or bathing? N N  Doing errands, shopping? N N  Preparing Food and eating ? N -  Using the Toilet? N -  In the past six months, have you accidently leaked urine? N -  Do you have problems with loss of bowel control? N -  Managing your Medications? N -  Managing your Finances? N -  Housekeeping or managing your Housekeeping? N -  Some recent data might be hidden    Patient Care Team: Tonia Ghent, MD as PCP - General Michael Boston, MD as Consulting Physician (General Surgery) Alden Hipp, MD as Consulting Physician (Obstetrics and Gynecology) Irene Shipper, MD as Consulting Physician (Gastroenterology)    Assessment:     Hearing Screening   125Hz  250Hz  500Hz  1000Hz  2000Hz  3000Hz  4000Hz  6000Hz  8000Hz   Right ear:   40 40 40  40    Left ear:   40 40 40  0    Vision Screening Comments: Last vision exam on 07/05/2016 with Dr. Elsie Amis   Exercise Activities and Dietary recommendations Current Exercise Habits: The patient does not participate in regular exercise at present, Exercise limited by: None identified  Goals    . Eat more fruits and vegetables          Starting 09/28/2016, I will continue to eat at least 4-5 servings of fresh fruits and  vegetables.       Fall Risk Fall Risk  09/28/2016 01/21/2015 02/14/2014 10/05/2013  Falls in the past year? No Yes No No  Number falls in past yr: - 2 or more - -  Injury with Fall? - Yes - -   Depression Screen PHQ 2/9 Scores 09/28/2016 01/21/2015 10/05/2013  PHQ - 2 Score 2 6 0  PHQ- 9  Score 3 - -     Cognitive Function MMSE - Mini Mental State Exam 09/28/2016  Orientation to time 5  Orientation to Place 5  Registration 3  Attention/ Calculation 0  Recall 3  Language- name 2 objects 0  Language- repeat 1  Language- follow 3 step command 3  Language- read & follow direction 0  Write a sentence 0  Copy design 0  Total score 20       PLEASE NOTE: A Mini-Cog screen was completed. Maximum score is 20. A value of 0 denotes this part of Folstein MMSE was not completed or the patient failed this part of the Mini-Cog screening.   Mini-Cog Screening Orientation to Time - Max 5 pts Orientation to Place - Max 5 pts Registration - Max 3 pts Recall - Max 3 pts Language Repeat - Max 1 pts Language Follow 3 Step Command - Max 3 pts   Immunization History  Administered Date(s) Administered  . Influenza Split 03/30/2011  . Influenza Whole 04/04/2009, 05/19/2010  . Influenza, Seasonal, Injecte, Preservative Fre 05/24/2012  . Influenza,inj,Quad PF,36+ Mos 03/28/2013, 02/14/2014, 08/04/2015  . Influenza-Unspecified 05/08/2016  . Pneumococcal Conjugate-13 01/21/2015  . Pneumococcal Polysaccharide-23 06/15/2003, 05/28/2016  . Td 07/13/2007   Screening Tests Health Maintenance  Topic Date Due  . URINE MICROALBUMIN  09/28/2017 (Originally 09/18/2015)  . INFLUENZA VACCINE  01/12/2017  . HEMOGLOBIN A1C  03/17/2017  . OPHTHALMOLOGY EXAM  07/05/2017  . TETANUS/TDAP  07/12/2017  . FOOT EXAM  09/28/2017  . DEXA SCAN  Completed  . PNA vac Low Risk Adult  Completed      Plan:     I have personally reviewed and addressed the Medicare Annual Wellness questionnaire and have noted the following  in the patient's chart:  A. Medical and social history B. Use of alcohol, tobacco or illicit drugs  C. Current medications and supplements D. Functional ability and status E.  Nutritional status F.  Physical activity G. Advance directives H. List of other physicians I.  Hospitalizations, surgeries, and ER visits in previous 12 months J.  [place] to include hearing, vision, cognitive, depression L. Referrals and appointments - none  In addition, I have reviewed and discussed with patient certain preventive protocols, quality metrics, and best practice recommendations. A written personalized care plan for preventive services as well as general preventive health recommendations were provided to patient.  See attached scanned questionnaire for additional information.   Signed,   Lindell Noe, MHA, BS, LPN Health Coach

## 2016-09-28 NOTE — Progress Notes (Signed)
PCP notes:  Health maintenance:  Urine microalbumin - completed  Foot exam - PCP will address at next appt  Eye exam - per pt, exam in Jan 2018; report provided  Flu vaccine - per pt, administered in Nov 2017  Abnormal screenings:   Hearing - failed  Depression score: 3  Patient concerns:   Left knee pain. Pain scale: 1/10  Nurse concerns:  None  Next PCP appt:   09/28/16 @ 1215  I reviewed health advisor's note, was available for consultation on the day of service listed in this note, and agree with documentation and plan. Elsie Stain, MD.

## 2016-09-28 NOTE — Patient Instructions (Signed)
[name] , Thank you for taking time to come for your Medicare Wellness Visit. I appreciate your ongoing commitment to your health goals. Please review the following plan we discussed and let me know if I can assist you in the future.   These are the goals we discussed: Goals    . Eat more fruits and vegetables          Starting 09/28/2016, I will continue to eat at least 4-5 servings of fresh fruits and vegetables.        This is a list of the screening recommended for you and due dates:  Health Maintenance  Topic Date Due  . Urine Protein Check  09/28/2017*  . Flu Shot  01/12/2017  . Hemoglobin A1C  03/17/2017  . Eye exam for diabetics  07/05/2017  . Tetanus Vaccine  07/12/2017  . Complete foot exam   09/28/2017  . DEXA scan (bone density measurement)  Completed  . Pneumonia vaccines  Completed  *Topic was postponed. The date shown is not the original due date.   Preventive Care for Adults  A healthy lifestyle and preventive care can promote health and wellness. Preventive health guidelines for adults include the following key practices.  . A routine yearly physical is a good way to check with your health care provider about your health and preventive screening. It is a chance to share any concerns and updates on your health and to receive a thorough exam.  . Visit your dentist for a routine exam and preventive care every 6 months. Brush your teeth twice a day and floss once a day. Good oral hygiene prevents tooth decay and gum disease.  . The frequency of eye exams is based on your age, health, family medical history, use  of contact lenses, and other factors. Follow your health care provider's ecommendations for frequency of eye exams.  . Eat a healthy diet. Foods like vegetables, fruits, whole grains, low-fat dairy products, and lean protein foods contain the nutrients you need without too many calories. Decrease your intake of foods high in solid fats, added sugars, and  salt. Eat the right amount of calories for you. Get information about a proper diet from your health care provider, if necessary.  . Regular physical exercise is one of the most important things you can do for your health. Most adults should get at least 150 minutes of moderate-intensity exercise (any activity that increases your heart rate and causes you to sweat) each week. In addition, most adults need muscle-strengthening exercises on 2 or more days a week.  Silver Sneakers may be a benefit available to you. To determine eligibility, you may visit the website: www.silversneakers.com or contact program at [phone] Mon-Fri between 8AM-8PM.   . Maintain a healthy weight. The body mass index (BMI) is a screening tool to identify possible weight problems. It provides an estimate of body fat based on height and weight. Your health care provider can find your BMI and can help you achieve or maintain a healthy weight.   For adults 20 years and older: ? A BMI below 18.5 is considered underweight. ? A BMI of 18.5 to 24.9 is normal. ? A BMI of 25 to 29.9 is considered overweight. ? A BMI of 30 and above is considered obese.   . Maintain normal blood lipids and cholesterol levels by exercising and minimizing your intake of saturated fat. Eat a balanced diet with plenty of fruit and vegetables. Blood tests for lipids and cholesterol  should begin at age 58 and be repeated every 5 years. If your lipid or cholesterol levels are high, you are over 50, or you are at high risk for heart disease, you may need your cholesterol levels checked more frequently. Ongoing high lipid and cholesterol levels should be treated with medicines if diet and exercise are not working.  . If you smoke, find out from your health care provider how to quit. If you do not use tobacco, please do not start.  . If you choose to drink alcohol, please do not consume more than 2 drinks per day. One drink is considered to be 12 ounces  (355 mL) of beer, 5 ounces (148 mL) of wine, or 1.5 ounces (44 mL) of liquor.  . If you are 39-70 years old, ask your health care provider if you should take aspirin to prevent strokes.  . Use sunscreen. Apply sunscreen liberally and repeatedly throughout the day. You should seek shade when your shadow is shorter than you. Protect yourself by wearing long sleeves, pants, a wide-brimmed hat, and sunglasses year round, whenever you are outdoors.  . Once a month, do a whole body skin exam, using a mirror to look at the skin on your back. Tell your health care provider of new moles, moles that have irregular borders, moles that are larger than a pencil eraser, or moles that have changed in shape or color.

## 2016-09-28 NOTE — Progress Notes (Signed)
L knee pain. Going on for about 3 weeks.  No trauma.  Has been using diclofenac gel topically w/o much relief.  Sig pain with weight bearing bearing.  Sx wax and wane.  Taking tramadol occ, with some relief of knee pain. Outside x-rays reviewed. She has decreased joint space with some mild osteoarthritic changes. I don't have a copy to keep in her file. She brought in outside disc, I reviewed the pictures, and then I gave her the disc to keep.  Hearing - failed.  Declined hearing aids.  D/w pt.    Diabetes:  Using medications without difficulties:yes Hypoglycemic episodes:no Hyperglycemic episodes:no Feet problems:no Blood Sugars averaging: 150-160s in the AM eye exam within last year: yes Goal A1c ~8.  8.4 on most recent check.  Similar to prev.    Hypertension:    Using medication without problems or lightheadedness: yes Chest pain with exertion:no Edema:no Short of breath:no  Elevated Cholesterol: Statin intolerant.   Labs d/w pt.   Diet compliance: yes Exercise: limited by knee pain.    h/o breast cancer with f/u pending.    h/o depression and anxiety with insomnia.  "I'm doing pretty good, some days are better than others."  She has h/o sig stressors in the past.  D/w pt.  Compliant with meds. Fall caution d/w pt.  She isn't sedated.    Advance directive - daughter Otis Peak designated if patient were incapacitated.   PMH and SH reviewed.   Vital signs, Meds and allergies reviewed.  ROS: Per HPI unless specifically indicated in ROS section   GEN: nad, alert and oriented HEENT: mucous membranes moist NECK: supple w/o LA CV: rrr. PULM: ctab, no inc wob ABD: soft, +bs EXT: no edema SKIN: no acute rash Able to bear weight on left knee. Slightly tender to palpation on the joint line medially with crepitus on range of motion noted.  Diabetic foot exam: Normal inspection No skin breakdown No calluses  Normal DP pulses Normal sensation to light tough and  monofilament Nails normal

## 2016-09-28 NOTE — Patient Instructions (Addendum)
[name] will call about your referral to Dr. Maxie Better.   Recheck A1c in about 4 months at or before a visit.  Take tramadol as needed for the pain.  Take care.  Glad to see you.

## 2016-09-30 ENCOUNTER — Encounter: Payer: Self-pay | Admitting: Family Medicine

## 2016-09-30 DIAGNOSIS — M25562 Pain in left knee: Secondary | ICD-10-CM | POA: Insufficient documentation

## 2016-09-30 NOTE — Assessment & Plan Note (Signed)
Controlled. Continue current medications. She agrees.

## 2016-09-30 NOTE — Assessment & Plan Note (Signed)
Not sedated with current meds. She does get some relief with current prescriptions. No adverse effect. Continue current medications. She agrees.

## 2016-09-30 NOTE — Assessment & Plan Note (Signed)
She has follow-up with oncology pending. I will await consult note.

## 2016-09-30 NOTE — Assessment & Plan Note (Signed)
Continue current medications. She agrees.

## 2016-09-30 NOTE — Assessment & Plan Note (Signed)
No change in medications. Continue work on diet next her size. Goal A1c around 8. Recheck later this year. She agrees. Labs discussed with patient.

## 2016-09-30 NOTE — Assessment & Plan Note (Signed)
With mild arthritic changes. Crepitus on range of motion. Tramadol as needed for pain. Update me as needed. She has tolerated this medicine in the past without complication. She agrees.

## 2016-09-30 NOTE — Assessment & Plan Note (Signed)
Statin intolerant. Continue work on diet and exercise that she can. She agrees.

## 2016-09-30 NOTE — Assessment & Plan Note (Signed)
Advance directive - daughter [name] designated if patient were incapacitated. 

## 2016-10-05 NOTE — Telephone Encounter (Signed)
Scheduled 09/29/16

## 2016-10-11 ENCOUNTER — Ambulatory Visit (HOSPITAL_BASED_OUTPATIENT_CLINIC_OR_DEPARTMENT_OTHER): Payer: Medicare Other | Admitting: Oncology

## 2016-10-11 ENCOUNTER — Other Ambulatory Visit: Payer: Medicare Other

## 2016-10-11 VITALS — BP 172/69 | HR 68 | Temp 98.0°F | Resp 18 | Ht 59.75 in | Wt 136.0 lb

## 2016-10-11 DIAGNOSIS — C50212 Malignant neoplasm of upper-inner quadrant of left female breast: Secondary | ICD-10-CM

## 2016-10-11 DIAGNOSIS — Z170 Estrogen receptor positive status [ER+]: Secondary | ICD-10-CM

## 2016-10-11 DIAGNOSIS — Z79811 Long term (current) use of aromatase inhibitors: Secondary | ICD-10-CM

## 2016-10-11 DIAGNOSIS — C50812 Malignant neoplasm of overlapping sites of left female breast: Secondary | ICD-10-CM | POA: Diagnosis not present

## 2016-10-11 DIAGNOSIS — M810 Age-related osteoporosis without current pathological fracture: Secondary | ICD-10-CM

## 2016-10-11 NOTE — Progress Notes (Signed)
[place]  Telephone:([phone]) [phone] Fax:(336) [phone]  OFFICE PROGRESS NOTE   ID: [name]   DOB: 12-Aug-1940  MR#: [id number]  HUD#:[id number]   PCP: Natalie Stain, MD SU: Natalie Bookbinder, MD RAD ONC: Natalie Koh, MD  CHIEF COMPLAINT: left breast cancer  CURRENT TREATMENT: Completing 5 years of letrozole   BREAST CANCER HISTORY: From Dr. Collier Salina [name]'s new patient evaluation note dated 08/18/2011:  "This is a pleasant 76 year old woman , from [place], who is referred for evaluation of a breast mass. This woman and has not had a mammogram since approximately 2009. She noted a mass in the medial portion of the left breast about a year ago so. [name] sought medical attention for this and was referred for a mammogram. Bilateral mammogram left breast ultrasound performed 08/09/2011 showed a firm at 71 cm mass the parasternal left breast 1:00 position with retraction of the overlying skin. No other abnormalities were seen. Ultrasound showed a hypoechoic irregular mass with margins. This area was biopsied on the same day and showed a invasive ductal cancer ER +100% PR +17% or less for the next 20% HER-2 was nonamplified. This is felt to be grade 2. An MRI scan of both breasts performed on 08/13/2011 showed an irregular enhancing mass with spiculated margins in the lower quadrant of the left breast measuring 1.5 x 1.7 x 1.9 cm this was abutting the pectoralis muscle enhancement was seen extending along the pectoralis muscle. "  INTERVAL HISTORY: [name] returns today for follow-up of her estrogen receptor positive breast cancer. She continues on letrozole, generally with good tolerance.  Hot flashes and vaginal dryness are not a major issue. She never developed the arthralgias or myalgias that many patients can experience on this medication. She obtains it at a good price.  REVIEW OF SYSTEMS: [name] is doing "okay". She has some soreness in the left axilla and sometimes  in the left breast. This is inconstant. She has problems with her left knee and is planning to see Dr. Maxie [name] later this week regarding that. She tells me family is not doing "okay". A detailed review of systems today was otherwise stable  PAST MEDICAL HISTORY: Past Medical History:  Diagnosis Date  . Anemia   . Anxiety   . Blood transfusion   . Blood transfusion without reported diagnosis 1983   prbc  . Breast cancer ([place]) 03/01/12   left breast lumpectomy=invasive ductal ca,grade I/III,INVILVES SKELATAL MUSCLE   er=+,pr=NEG. HER2 NEG.  . Cancer ([place]) 08/09/11 DX    LEFT  . Depression   . Diabetes mellitus 2011   type II, on pills   . Diverticulosis   . Dyspnea    with exertion if walks a long ways per patient   . GERD (gastroesophageal reflux disease)   . Headache   . History of radiation therapy    RIGHT JAW 5 TXS FOR ABSESSED TOOTH IN HER 30'S   . History of radiation therapy 04/10/12-05/26/12   left breast  . Hyperlipidemia   . Hypertension   . Insomnia, unspecified   . Osteoporosis, unspecified    with prev fosamax treatment  . Other abnormal glucose   . PONV (postoperative nausea and vomiting)     PAST SURGICAL HISTORY: Past Surgical History:  Procedure Laterality Date  . ABDOMINAL HYSTERECTOMY  1983   b/ls alpingooph orectomy 4 years later  . BILATERAL OOPHORECTOMY  1987  . BREAST LUMPECTOMY  03/01/12   LEFT=INVASIVE DUCTAL CA,gRADE i/iii,INVASION AT PSTERIOR RESECTION MARGIN AND  INVLOVES SKE;ATAL MUSCLE,PERINEURAL INVASION IDENTIFIED  . INGUINAL HERNIA REPAIR Left 1999   LIH  . ORIF FEMUR FRACTURE Right    titanium rod  . SIGMOIDOSCOPY    . TUBAL LIGATION  1975    FAMILY HISTORY Family History  Problem Relation Age of Onset  . Diabetes Mother   . Alcohol abuse Father   . Diabetes Sister   . Parkinson's disease Sister   . Bladder Cancer Brother   . Diabetes Sister   . Bladder Cancer Son   . Colon cancer Neg Hx   . Esophageal cancer Neg Hx   .  Pancreatic cancer Neg Hx   . Rectal cancer Neg Hx   . Stomach cancer Neg Hx   . Breast cancer Neg Hx     GYNECOLOGIC HISTORY: Menarche at age 63, Menopause at age  69,  no history of hormone replacement therapy.  SOCIAL HISTORY: The patient's husband died unexpectedly after dental surgery in 05/2012.  The patient has 4 adult children, 2 girls and 2 boys.  She also has 4 grandchildren.  [name] currently lives in [place] and resides with her daughter [name].  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 There is also a 54 year old grandson in the home. Another grandson committed suicide August 2015. The patient is a retired Warden/ranger.  She enjoys doing crafts, sewing, working on the computer, and playing with her Sun.    ADVANCED DIRECTIVES: Not on file  HEALTH MAINTENANCE: Social History  Substance Use Topics  . Smoking status: Never Smoker  . Smokeless tobacco: Never Used  . Alcohol use No    Colonoscopy: Not on file PAP: Not on file Bone density: The patient's last bone density scan on 08/25/2011 showed a T score of -4.9 (osteoporosis). Lipid panel: 06/17/2011   Allergies  Allergen Reactions  . Bisphosphonates     Unclear relation to meds, but pt had long bone fracture after treatment with fosamax  . Diclofenac Potassium     Not sure  . Naproxen Sodium     Not sure  . Sulfonamide Derivatives   . Ezetimibe-Simvastatin     myalgias  . Simvastatin     myalgias    Current Outpatient Prescriptions  Medication Sig Dispense Refill  . ACCU-CHEK AVIVA PLUS test strip USE ONE STRIP TO CHECK GLUCOSE ONCE DAILY 100 each 0  . ACCU-CHEK SOFTCLIX LANCETS lancets USE ONCE DAILY AS DIRECTED 100 each 1  . amLODipine (NORVASC) 5 MG tablet TAKE ONE TABLET BY MOUTH ONCE DAILY 90 tablet 3  . buPROPion (WELLBUTRIN SR) 100 MG 12 hr tablet TAKE ONE TABLET BY MOUTH ONCE DAILY 90 tablet 1  . CALCIUM CITRATE PO Take 500 mg by mouth at bedtime.      . cholecalciferol (VITAMIN D) 1000 UNITS tablet Take 1,000 Units by mouth daily.    . cyclobenzaprine (FLEXERIL) 10 MG tablet Take 0.5-1 tablets (5-10 mg total) by mouth 2 (two) times daily as needed for muscle spasms (sedation caution). 30 tablet 1  . letrozole (FEMARA) 2.5 MG tablet Take 1 tablet (2.5 mg total) by mouth daily. 90 tablet 4  . LORazepam (ATIVAN) 2 MG tablet TAKE ONE TABLET BY MOUTH THREE TIMES DAILY AS NEEDED FOR ANXIETY TAKE ONE OR TWO TABLETS AT BEDTIME 150 tablet 2  . metFORMIN (GLUCOPHAGE) 500 MG tablet Take 2 tablets (1,000 mg total) by mouth 2 (two) times daily with a meal.    . metoprolol (LOPRESSOR) 50 MG tablet  TAKE ONE TABLET BY MOUTH TWICE DAILY 180 tablet 1  . Omega-3 Fatty Acids (FISH OIL) 1000 MG CAPS Take 1,000 mg by mouth 2 (two) times daily.     Marland Kitchen omeprazole (PRILOSEC) 20 MG capsule Take 1 capsule (20 mg total) by mouth 2 (two) times daily before a meal.    . ondansetron (ZOFRAN) 4 MG tablet Take 1 tablet (4 mg total) by mouth every 8 (eight) hours as needed for nausea or vomiting. 20 tablet 0  . polyethylene glycol (MIRALAX / GLYCOLAX) packet Take 17 g by mouth daily. 30 each 1  . sertraline (ZOLOFT) 100 MG tablet TAKE 1 TABLET(100 MG)  BY MOUTH IN THE MORNING    . traMADol (ULTRAM) 50 MG tablet Take 1 tablet (50 mg total) by mouth every 12 (twelve) hours as needed (for knee pain). 30 tablet 1   No current facility-administered medications for this visit.     OBJECTIVE: Middle-aged white woman Who appears stated age 21:   10/11/16 1323  BP: (!) 172/69  Pulse: 68  Resp: 18  Temp: 98 F (36.7 C)     Body mass index is 26.78 kg/m.      ECOG FS: 1  Sclerae unicteric, pupils round and equal Oropharynx clear, slightly dry No cervical or supraclavicular adenopathy Lungs no rales or rhonchi Heart regular rate and rhythm Abd soft, nontender, positive bowel sounds MSK no focal spinal tenderness, no upper extremity lymphedema Neuro: nonfocal, well  oriented, appropriate affect Breasts: The right breast is benign. The left breast as undergone lumpectomy and radiation, with no evidence of local recurrence. Both axillae are benign.   LAB RESULTS: Lab Results  Component Value Date   WBC 8.6 05/17/2016   NEUTROABS 4.7 02/20/2016   HGB 12.6 05/17/2016   HCT 37.0 05/17/2016   MCV 86.7 05/17/2016   PLT 280 05/17/2016      Chemistry      Component Value Date/Time   NA 137 05/17/2016 0805   NA 137 02/20/2016 1108   K 4.1 05/28/2016 1615   K 4.1 02/20/2016 1108   CL 99 (L) 05/17/2016 0805   CL 100 03/22/2012 1200   CO2 28 05/17/2016 0805   CO2 26 02/20/2016 1108   BUN <5 (L) 05/17/2016 0805   BUN 12.8 02/20/2016 1108   CREATININE 0.50 05/17/2016 0805   CREATININE 0.7 02/20/2016 1108      Component Value Date/Time   CALCIUM 9.2 05/17/2016 0805   CALCIUM 9.8 02/20/2016 1108   ALKPHOS 67 02/20/2016 1108   AST 43 (H) 02/20/2016 1108   ALT 47 02/20/2016 1108   BILITOT 0.35 02/20/2016 1108      Lab Results  Component Value Date   LABCA2 24 12/31/2011    Urinalysis    Component Value Date/Time   COLORURINE YELLOW 10/17/2010 1330   APPEARANCEUR CLEAR 10/17/2010 1330   LABSPEC 1.005 03/22/2012 1304   PHURINE 6.0 03/22/2012 1304   PHURINE 6.0 10/17/2010 1330   GLUCOSEU NEGATIVE 10/17/2010 1330   HGBUR Small 03/22/2012 1304   HGBUR TRACE (A) 10/17/2010 1330   BILIRUBINUR Neg 08/04/2015 1207   BILIRUBINUR Negative 03/22/2012 1304   KETONESUR Negative 03/22/2012 1304   KETONESUR NEGATIVE 10/17/2010 1330   PROTEINUR Neg 08/04/2015 1207   PROTEINUR Negative 03/22/2012 1304   PROTEINUR NEGATIVE 10/17/2010 1330   UROBILINOGEN 4.0 08/04/2015 1207   UROBILINOGEN 0.2 10/17/2010 1330   NITRITE Positive 08/04/2015 1207   NITRITE Negative 03/22/2012 1304   NITRITE NEGATIVE 10/17/2010 1330  LEUKOCYTESUR Negative 08/04/2015 1207   LEUKOCYTESUR Small 03/22/2012 1304    STUDIES: CT scan of the abdomen and pelvis 04/15/2016  showed a colovaginal fistula. Bilateral mammography with tomography 03/01/2016 to the breast density to be category B. There was no evidence of malignancy.   ASSESSMENT: 76 y.o. Boring, [place] woman: 1.  Status post left breast needle core biopsy on 08/09/2011 which showed invasive mammary carcinoma at the 9 o'clock position, estrogen receptor positive 100%, progesterone receptor positive 17%, Ki-67 20%, HER-2/neu negative by CISH with a ratio of 1.30.  2.  Neoadjuvant letrozole/Femara started on 08/16/2011.  3.  Status post lateral breast MRI on 08/13/2011 which showed foci of nonspecific enhancement was seen bilaterally. An  irregular, enhancing mass with spiculated margins was seen in the lower inner quadrant of the left breast, posteriorly measuring 1.5 x 1.7 x 1.9 cm. The mass abutted the pectoralis muscle and enhancement was seen extending medially along the pectoralis muscle. Enhancement was also seen extending towards the skin where there was overlying retraction and thickening. A biopsy clip was seen in association with the mass. No other suspicious mass or enhancement was seen in either breast. No internal mammary or axillary adenopathy was present (clinical stage I, T1 N0)  4.  Status post left breast lumpectomy with sentinel node biopsy on 03/01/2012 for a stage I, ypT1c ypN0, 1.2 cm, invasive ductal carcinoma with perineural invasion, grade 1, estrogen receptor positive 99%, progesterone receptor negative 0%, Ki-67 20%, HER-2/neu negative by CISH with a ratio of 1.11, with 0/1 metastatic left axillary lymph nodes.  5.  The patient had radiation therapy from 04/10/2012 through 05/26/2012.  6.  The patient has been on Femara since 08/2011.  7.  Continued grief over the recent loss of her husband and now grandson  89.  Osteoporosis: Bone density scan 12/24/2013 shows a T score of -4.2.  (a) repeat bone density 03/01/2016 finds a T score of -4.4  PLAN: Dmiya is now just  about 5 years out from definitive surgery for her breast cancer with no evidence of disease recurrence. This is very favorable.  She completes 5 years of letrozole at this point. In some cases we do continue letrozole beyond 5 years, to a total of 7, but that is generally patients are at higher risk of recurrence than she is. In her case I do not think the letrozole would be very helpful. It might decrease the risk of recurrence by perhaps 1% at the same time making osteoporosis worse.  Accordingly at this point I'm comfortable releasing her to her primary care physician. All she will need in terms of breast cancer follow-up is yearly mammography, which you should be obtains at the breast Center in September, and a yearly physician breast exam  I will be glad to see [place] at any point in the future if on when the need arises but as of now are making no further routine appointments for her here. Chauncey Cruel, MD  10/11/2016, 2:14 PM

## 2016-10-13 DIAGNOSIS — M25562 Pain in left knee: Secondary | ICD-10-CM | POA: Diagnosis not present

## 2016-10-21 ENCOUNTER — Encounter: Payer: Self-pay | Admitting: Family Medicine

## 2016-10-24 ENCOUNTER — Telehealth: Payer: Self-pay | Admitting: Family Medicine

## 2016-10-24 NOTE — Telephone Encounter (Signed)
See mychart message. Please send an order for diabetic test meter.  Use daily to check sugar as needed. Please send a prescription for 100 lancets and 100 strips with 3 refills on both. Diagnosis E11.9. Thanks.

## 2016-10-25 MED ORDER — GLUCOSE BLOOD VI STRP: ORAL_STRIP | 3 refills | Status: DC

## 2016-10-25 MED ORDER — ACCU-CHEK AVIVA PLUS W/DEVICE KIT: PACK | 0 refills | Status: DC

## 2016-10-25 MED ORDER — ACCU-CHEK SOFTCLIX LANCETS MISC: 3 refills | Status: DC

## 2016-10-25 NOTE — Telephone Encounter (Signed)
Done

## 2016-11-05 ENCOUNTER — Telehealth: Payer: Self-pay | Admitting: Family Medicine

## 2016-11-05 NOTE — Telephone Encounter (Signed)
[facility]  Patient Name: [name]  DOB: Sep 06, 1940    Initial Comment Caller states her blood sugar is 338.    Nurse Assessment  Nurse: Cherie Dark RN, Jarrett Soho Date/Time (Eastern Time): 11/05/2016 11:47:17 AM  Confirm and document reason for call. If symptomatic, describe symptoms. ---Caller states her blood sugar is 338 and just checked it and it is 257. States she was nauseated but took a medication.  Does the patient have any new or worsening symptoms? ---Yes  Will a triage be completed? ---Yes  Related visit to physician within the last 2 weeks? ---No  Does the PT have any chronic conditions? (i.e. diabetes, asthma, etc.) ---Yes  List chronic conditions. ---diabetes, HTN,  Is this a behavioral health or substance abuse call? ---No     Guidelines    Guideline Title Affirmed Question Affirmed Notes  Diabetes - High Blood Sugar [1] Blood glucose > 240 mg/dl (13 mmol/l) AND [2] does not use insulin (e.g., not insulin-dependent; most people with type 2 diabetes)    Final Disposition User   [place], RN, Jarrett Soho    Disagree/Comply: Comply

## 2016-11-05 NOTE — Telephone Encounter (Signed)
She should be able to get her sugar down with inc in fluids and low carb foods.  Update [place] as needed.  Thanks.

## 2016-11-05 NOTE — Telephone Encounter (Signed)
Patient advised.

## 2016-11-05 NOTE — Telephone Encounter (Signed)
PLEASE NOTE: All timestamps contained within this report are represented as [place] Standard Time. CONFIDENTIALTY NOTICE: This fax transmission is intended only for the addressee. It contains information that is legally privileged, confidential or otherwise protected from use or disclosure. If you are not the intended recipient, you are strictly prohibited from reviewing, disclosing, copying using or disseminating any of this information or taking any action in reliance on or regarding this information. If you have received this fax in error, please notify us immediately by telephone so that we can arrange for its return to [place]. Phone: [phone], Toll-Free: [phone], Fax: [phone] Page: 1 of 1 Call Id: [id number] [place] Patient Name: [name] Gender: Female DOB: 1941/04/08 Age: 76 Y 60 M 24 D Return Phone Number: [phone] (Primary) City/State/Zip: [place] Client [facility] Day - Client Client Site [facility] - Day Physician Renford Dills - MD Who Is Calling Patient / Member / Family / Caregiver Call Type Triage / Clinical Relationship To Patient Self Return Phone Number [phone] (Primary) Chief Complaint Blood Sugar High Reason for Call Symptomatic / Request for [place] states her blood sugar is 338. Appointment Disposition EMR Appointment Not Necessary Info pasted into Epic Yes Nurse Assessment Nurse: Cherie Dark, RN, Jarrett Soho Date/Time (Eastern Time): 11/05/2016 11:47:17 AM Confirm and document reason for call. If symptomatic, describe symptoms. ---Caller states her blood sugar is 338 and just checked it and it is 257. States she was nauseated but took a medication. Does the PT have any chronic conditions? (i.e. diabetes, asthma, etc.) ---Yes List chronic conditions. ---diabetes,  HTN, Guidelines Guideline Title Affirmed Question Diabetes - High Blood Sugar [1] Blood glucose > 240 mg/dl (13 mmol/l) AND [2] does not use insulin (e.g., not insulin-dependent; most people with type 2 diabetes) Disp. Time Eilene Ghazi Time) Disposition Final User 11/05/2016 11:58:07 AM Home Care Yes [place], RN, [facility] Advice Given Per Guideline HOME CARE: You should be able to treat this at home. TREATMENT - LIQUIDS: * Drink at least one glass (8 oz; 240 ml) of water per hour for the next 4 hours (Reason: adequate hydration will reduce hyperglycemia). TREATMENT - DIABETES MEDICATIONS: Continue taking your diabetes pills.. MEASURE AND RECORD YOUR BLOOD GLUCOSE: * Measure your blood glucose before breakfast and before going to bed. * Record the results and show them to your doctor at your next office visit. CALL BACK IF: * Blood glucose over 300 mg/dL (16.5 mmol/l), two or more times in a row. * Urine ketones become moderate or large (or more than 1+); if you check blood ketones, blood ketone test is 1.6 or higher * Vomiting lasting over 4 hours or unable to drink any fluids * Rapid breathing occurs * You become worse or have more questions. CARE ADVICE given per Diabetes - High Blood Sugar (Adult) guideline. EXPECTED COURSE - You should CALL YOUR DOCTOR WITHIN 1-3 DAYS if: * Your blood sugar continues to get above 240 mg/dl (13 mmol/l). * Generally, you should try to drink 6-8 glasses of water each day.

## 2016-11-11 ENCOUNTER — Other Ambulatory Visit: Payer: Self-pay | Admitting: Family Medicine

## 2016-11-11 NOTE — Telephone Encounter (Signed)
Last refill 04/29/16, last OV 09/28/16

## 2016-11-11 NOTE — Telephone Encounter (Signed)
Sent. Thanks.   

## 2016-12-01 ENCOUNTER — Other Ambulatory Visit: Payer: Self-pay | Admitting: Family Medicine

## 2016-12-06 ENCOUNTER — Encounter (HOSPITAL_COMMUNITY): Payer: Self-pay | Admitting: *Deleted

## 2016-12-06 ENCOUNTER — Emergency Department (HOSPITAL_COMMUNITY)
Admission: EM | Admit: 2016-12-06 | Discharge: 2016-12-07 | Disposition: A | Discharge status: Home / Self Care | Payer: Medicare Other | Attending: Emergency Medicine | Admitting: Emergency Medicine

## 2016-12-06 ENCOUNTER — Emergency Department (HOSPITAL_COMMUNITY): Payer: Medicare Other

## 2016-12-06 DIAGNOSIS — E119 Type 2 diabetes mellitus without complications: Secondary | ICD-10-CM | POA: Diagnosis not present

## 2016-12-06 DIAGNOSIS — S42202A Unspecified fracture of upper end of left humerus, initial encounter for closed fracture: Secondary | ICD-10-CM | POA: Diagnosis not present

## 2016-12-06 DIAGNOSIS — M79602 Pain in left arm: Secondary | ICD-10-CM | POA: Diagnosis not present

## 2016-12-06 DIAGNOSIS — I10 Essential (primary) hypertension: Secondary | ICD-10-CM | POA: Insufficient documentation

## 2016-12-06 DIAGNOSIS — Y929 Unspecified place or not applicable: Secondary | ICD-10-CM | POA: Diagnosis not present

## 2016-12-06 DIAGNOSIS — M25522 Pain in left elbow: Secondary | ICD-10-CM | POA: Diagnosis not present

## 2016-12-06 DIAGNOSIS — Z853 Personal history of malignant neoplasm of breast: Secondary | ICD-10-CM | POA: Diagnosis not present

## 2016-12-06 DIAGNOSIS — W01198A Fall on same level from slipping, tripping and stumbling with subsequent striking against other object, initial encounter: Secondary | ICD-10-CM | POA: Diagnosis not present

## 2016-12-06 DIAGNOSIS — Z79899 Other long term (current) drug therapy: Secondary | ICD-10-CM | POA: Diagnosis not present

## 2016-12-06 DIAGNOSIS — Z7984 Long term (current) use of oral hypoglycemic drugs: Secondary | ICD-10-CM | POA: Insufficient documentation

## 2016-12-06 DIAGNOSIS — R531 Weakness: Secondary | ICD-10-CM | POA: Diagnosis not present

## 2016-12-06 DIAGNOSIS — S59902A Unspecified injury of left elbow, initial encounter: Secondary | ICD-10-CM | POA: Diagnosis not present

## 2016-12-06 DIAGNOSIS — S42292A Other displaced fracture of upper end of left humerus, initial encounter for closed fracture: Secondary | ICD-10-CM | POA: Diagnosis not present

## 2016-12-06 DIAGNOSIS — Y939 Activity, unspecified: Secondary | ICD-10-CM | POA: Diagnosis not present

## 2016-12-06 DIAGNOSIS — S59912A Unspecified injury of left forearm, initial encounter: Secondary | ICD-10-CM | POA: Diagnosis present

## 2016-12-06 DIAGNOSIS — Y999 Unspecified external cause status: Secondary | ICD-10-CM | POA: Insufficient documentation

## 2016-12-06 DIAGNOSIS — R52 Pain, unspecified: Secondary | ICD-10-CM | POA: Diagnosis not present

## 2016-12-06 DIAGNOSIS — W19XXXA Unspecified fall, initial encounter: Secondary | ICD-10-CM

## 2016-12-06 MED ORDER — FENTANYL CITRATE (PF) 100 MCG/2ML IJ SOLN
50.0000 ug | Freq: Once | INTRAMUSCULAR | Status: AC
  Administered 2016-12-06: 50 ug via INTRAVENOUS
  Filled 2016-12-06: qty 2

## 2016-12-06 MED ORDER — HYDROMORPHONE HCL 2 MG PO TABS
1.0000 mg | ORAL_TABLET | Freq: Once | ORAL | Status: AC
  Administered 2016-12-06: 1 mg via ORAL
  Filled 2016-12-06: qty 1

## 2016-12-06 MED ORDER — HYDROMORPHONE HCL 2 MG PO TABS: 1.0000 mg | ORAL_TABLET | Freq: Four times a day (QID) | ORAL | 0 refills | Status: DC | PRN

## 2016-12-06 MED ORDER — OXYCODONE-ACETAMINOPHEN 5-325 MG PO TABS
1.0000 | ORAL_TABLET | Freq: Once | ORAL | Status: AC
  Administered 2016-12-06: 1 via ORAL
  Filled 2016-12-06: qty 1

## 2016-12-06 NOTE — ED Notes (Signed)
Pt given water to drink; reports taking dilaudid in the past that seems to help with pain control. Sling immobilizer readjusted, pt appears more comfortable sitting in the chair. Daughter at bedside

## 2016-12-06 NOTE — ED Notes (Signed)
Ortho paged for sling

## 2016-12-06 NOTE — ED Notes (Signed)
Ortho tech at bedside 

## 2016-12-06 NOTE — Progress Notes (Signed)
Orthopedic Tech Progress Note Patient Details:  [name] [birth year] [id number]  Ortho Devices Type of Ortho Device: Sling immobilizer Ortho Device/Splint Location: applied sling immobilizer to pt left arm/shoulder.  pt tolerated application well.   Left arm Ortho Device/Splint Interventions: Application, Adjustment   Kristopher Oppenheim 12/06/2016, 9:53 PM

## 2016-12-06 NOTE — Discharge Instructions (Signed)
Please be sure to schedule an appointment with our orthopedic colleagues for this week for repeat evaluation.  The sure to return here for any concerning changes in your condition.

## 2016-12-06 NOTE — ED Notes (Signed)
Dr.Lockwood at bedside  

## 2016-12-06 NOTE — ED Triage Notes (Signed)
Pt was sitting on a bar stool and tipped over into the floor. Pt c/o L elbow and arm pain since fall. Denies hitting head on the floor or any LOC

## 2016-12-07 ENCOUNTER — Telehealth: Payer: Self-pay | Admitting: *Deleted

## 2016-12-07 NOTE — ED Provider Notes (Signed)
[facility] DEPT Provider Note   CSN: [id number] Arrival date & time: 12/06/16  2001     History   Chief Complaint Chief Complaint  Patient presents with  . Arm Injury  . Fall    HPI [name] is a 76 y.o. female.  HPI Patient presents after a fall with pain in her left arm. Patient was standing in the stool, trying to reach something when she slipped, falling onto her left arm. No head trauma, no loss of consciousness, no focal weakness anywhere. However, the patient has had substantial pain in the left arm just proximal to the elbow since the fall. No loss of sensation in the hand, no weakness in the hand. No medication taken for pain relief.     Past Medical History:  Diagnosis Date  . Anemia   . Anxiety   . Blood transfusion   . Blood transfusion without reported diagnosis 1983   prbc  . Breast cancer ([place]) 03/01/12   left breast lumpectomy=invasive ductal ca,grade I/III,INVILVES SKELATAL MUSCLE   er=+,pr=NEG. HER2 NEG.  . Cancer ([place]) 08/09/11 DX    LEFT  . Depression   . Diabetes mellitus 2011   type II, on pills   . Diverticulosis   . Dyspnea    with exertion if walks a long ways per patient   . GERD (gastroesophageal reflux disease)   . Headache   . History of radiation therapy    RIGHT JAW 5 TXS FOR ABSESSED TOOTH IN HER 30'S   . History of radiation therapy 04/10/12-05/26/12   left breast  . Hyperlipidemia   . Hypertension   . Insomnia, unspecified   . Osteoporosis, unspecified    with prev fosamax treatment  . Other abnormal glucose   . PONV (postoperative nausea and vomiting)     Patient Active Problem List   Diagnosis Date Noted  . Left knee pain 09/30/2016  . Hypertension   . Colovaginal fistula s/p LAR rectosigmoid resection & colovaginal fistula repair/omentopexy 05/14/2016 05/14/2016  . Back spasm 08/05/2015  . Dysuria 08/05/2015  . Advance care planning 13-Feb-2015  . Fall 02/13/2015  . Sudden death of family member  02/19/2014  . Medicare annual wellness visit, initial 10/08/2013  . BRBPR (bright red blood per rectum) 03/28/2013  . Chest wall pain 02/23/2012  . Breast cancer of upper-inner quadrant of left female breast ([place]) 08/12/2011  . Normal echocardiogram 12/07/2010  . Iron deficiency anemia 11/20/2010  . Femur fracture, right ([place]) 11/20/2010  . SHOULDER PAIN, LEFT 07/03/2010  . DM (diabetes mellitus) ([place]) 02/19/10  . HLD (hyperlipidemia) 01/27/2010  . GERD 01/27/2010  . Osteoporosis 01/27/2010  . Insomnia 01/27/2010  . Anxiety state 01/02/2010    Past Surgical History:  Procedure Laterality Date  . ABDOMINAL HYSTERECTOMY  1983   b/ls alpingooph orectomy 4 years later  . BILATERAL OOPHORECTOMY  1987  . BREAST LUMPECTOMY  03/01/12   LEFT=INVASIVE DUCTAL CA,gRADE i/iii,INVASION AT PSTERIOR RESECTION MARGIN AND INVLOVES SKE;ATAL MUSCLE,PERINEURAL INVASION IDENTIFIED  . INGUINAL HERNIA REPAIR Left 1999   LIH  . ORIF FEMUR FRACTURE Right    titanium rod  . SIGMOIDOSCOPY    . TUBAL LIGATION  1975    OB History    No data available       Home Medications    Prior to Admission medications   Medication Sig Start Date End Date Taking? Authorizing Provider  ACCU-CHEK SOFTCLIX LANCETS lancets USE ONCE DAILY AS DIRECTED.  E11.9 10/25/16   Elsie Stain  S, MD  amLODipine (NORVASC) 5 MG tablet TAKE ONE TABLET BY MOUTH ONCE DAILY 04/06/16   Tonia Ghent, MD  Blood Glucose Monitoring Suppl (ACCU-CHEK AVIVA PLUS) w/Device KIT Use as instructed to test blood sugar once daily or as directed.  Dx:  E11.9  Non-insulin dependent. 10/25/16   Tonia Ghent, MD  buPROPion [facility] SR) 100 MG 12 hr tablet TAKE ONE TABLET BY MOUTH ONCE DAILY 11/11/16   Tonia Ghent, MD  CALCIUM CITRATE PO Take 500 mg by mouth at bedtime.     [provider]  cholecalciferol (VITAMIN D) 1000 UNITS tablet Take 1,000 Units by mouth daily.    [provider]  cyclobenzaprine (FLEXERIL) 10  MG tablet Take 0.5-1 tablets (5-10 mg total) by mouth 2 (two) times daily as needed for muscle spasms (sedation caution). 09/22/16   Tonia Ghent, MD  glucose blood (ACCU-CHEK AVIVA PLUS) test strip USE ONE STRIP TO CHECK GLUCOSE ONCE DAILY.  E11.9 10/25/16   Tonia Ghent, MD  HYDROmorphone (DILAUDID) 2 MG tablet Take 0.5 tablets (1 mg total) by mouth every 6 (six) hours as needed for severe pain. 12/06/16   Carmin Muskrat, MD  letrozole [facility]) 2.5 MG tablet Take 1 tablet (2.5 mg total) by mouth daily. 02/20/16   Holley Bouche, NP  LORazepam (ATIVAN) 2 MG tablet TAKE ONE TABLET BY MOUTH THREE TIMES DAILY AS NEEDED FOR ANXIETY TAKE ONE OR TWO TABLETS AT BEDTIME 09/14/16   Tonia Ghent, MD  metFORMIN (GLUCOPHAGE) 500 MG tablet TAKE ONE TO TWO TABLETS BY MOUTH TWICE DAILY WITH  A  MEAL 12/01/16   Tonia Ghent, MD  metoprolol (LOPRESSOR) 50 MG tablet TAKE ONE TABLET BY MOUTH TWICE DAILY 05/31/16   Tonia Ghent, MD  Omega-3 Fatty Acids (FISH OIL) 1000 MG CAPS Take 1,000 mg by mouth 2 (two) times daily.     [provider]  omeprazole (PRILOSEC) 20 MG capsule Take 1 capsule (20 mg total) by mouth 2 (two) times daily before a meal. 01/22/15   Tonia Ghent, MD  ondansetron (ZOFRAN) 4 MG tablet Take 1 tablet (4 mg total) by mouth every 8 (eight) hours as needed for nausea or vomiting. 06/02/16   Tonia Ghent, MD  polyethylene glycol (MIRALAX / Floria Raveling) packet Take 17 g by mouth daily. 05/28/16   Tonia Ghent, MD  sertraline (ZOLOFT) 100 MG tablet TAKE 1 TABLET(100 MG)  BY MOUTH IN THE MORNING 05/28/16   Tonia Ghent, MD  traMADol (ULTRAM) 50 MG tablet Take 1 tablet (50 mg total) by mouth every 12 (twelve) hours as needed (for knee pain). 09/28/16   Tonia Ghent, MD    Family History Family History  Problem Relation Age of Onset  . Diabetes Mother   . Alcohol abuse Father   . Diabetes Sister   . Parkinson's disease Sister   . Bladder Cancer Brother   .  Diabetes Sister   . Bladder Cancer Son   . Colon cancer Neg Hx   . Esophageal cancer Neg Hx   . Pancreatic cancer Neg Hx   . Rectal cancer Neg Hx   . Stomach cancer Neg Hx   . Breast cancer Neg Hx     Social History Social History  Substance Use Topics  . Smoking status: Never Smoker  . Smokeless tobacco: Never Used  . Alcohol use No     Allergies   Bisphosphonates; Diclofenac potassium; Naproxen sodium; Sulfonamide derivatives;  Ezetimibe-simvastatin; and Simvastatin   Review of Systems Review of Systems  Constitutional:       Per HPI, otherwise negative  HENT:       Per HPI, otherwise negative  Respiratory:       Per HPI, otherwise negative  Cardiovascular:       Per HPI, otherwise negative  Gastrointestinal: Negative for vomiting.  Endocrine:       Negative aside from HPI  Genitourinary:       Neg aside from HPI   Musculoskeletal:       Per HPI, otherwise negative  Skin: Negative.   Neurological: Negative for syncope.     Physical Exam Updated Vital Signs BP (!) 144/78   Pulse 78   Temp 98.2 F (36.8 C) (Oral)   Resp 16   SpO2 99%   Physical Exam  Constitutional: She is oriented to person, place, and time. She has a sickly appearance. No distress.  HENT:  Head: Normocephalic and atraumatic.  Eyes: Conjunctivae and EOM are normal.  Cardiovascular: Normal rate and regular rhythm.   Pulmonary/Chest: Effort normal and breath sounds normal. No stridor. No respiratory distress.  Abdominal: She exhibits no distension.  Musculoskeletal: She exhibits no edema.       Left shoulder: She exhibits decreased range of motion. She exhibits no tenderness, no bony tenderness, no swelling and no effusion.       Left elbow: She exhibits decreased range of motion.       Left wrist: Normal.       Arms: Patient moves both legs spontaneously, hip is flexed spontaneously, with no tenderness about the pelvis.   Neurological: She is alert and oriented to person, place, and  time. She displays no atrophy and no tremor. No cranial nerve deficit or sensory deficit. She exhibits normal muscle tone. She displays no seizure activity. Coordination normal.  Skin: Skin is warm and dry.  Psychiatric: She has a normal mood and affect.  Nursing note and vitals reviewed.    ED Treatments / Results   Radiology Dg Elbow Complete Left  Result Date: 12/06/2016 CLINICAL DATA:  Fall with arm pain EXAM: LEFT ELBOW - COMPLETE 3+ VIEW COMPARISON:  None. FINDINGS: There is no evidence of fracture, dislocation, or joint effusion. There is no evidence of arthropathy or other focal bone abnormality. Soft tissues are unremarkable. IMPRESSION: Negative. Electronically Signed   By: Donavan Foil [name]   On: 12/06/2016 21:09   Dg Humerus Left  Result Date: 12/06/2016 CLINICAL DATA:  Fall with pain in the left arm EXAM: LEFT HUMERUS - 2+ VIEW COMPARISON:  10/05/2013 FINDINGS: Acute slightly impacted appearing fracture involving the left humeral neck with moderate valgus angulation. No dislocation of the humeral head. IMPRESSION: Acute slightly impacted and angulated fracture involving the proximal left humerus. Electronically Signed   By: Donavan Foil [name]   On: 12/06/2016 21:08      Procedures Procedures (including critical care time)  Medications Ordered in ED Medications  fentaNYL (SUBLIMAZE) injection 50 mcg (50 mcg Intravenous Given 12/06/16 2015)  oxyCODONE-acetaminophen (PERCOCET/ROXICET) 5-325 MG per tablet 1 tablet (1 tablet Oral Given 12/06/16 2140)  HYDROmorphone (DILAUDID) tablet 1 mg (1 mg Oral Given 12/06/16 2251)     Initial Impression / Assessment and Plan / ED Course  I have reviewed the triage vital signs and the nursing notes.  Pertinent labs & imaging results that were available during my care of the patient were reviewed by me and considered in my medical  decision making (see chart for details).  After the initial evaluation I demonstrated the x-ray to the  patient and her daughter. We discussed the fracture of the proximal left humerus. Subsequent, the patient had multiple attempts at analgesia, IM, Percocet, Dilaudid, with only the last providing substantial relief. With this analgesia achieved, and no other complaints, patient discharged in stable condition with outpatient orthopedic follow-up.   Final Clinical Impressions(s) / ED Diagnoses   Final diagnoses:  Fall, initial encounter  Other closed displaced fracture of proximal end of left humerus, initial encounter    New Prescriptions New Prescriptions   HYDROMORPHONE (DILAUDID) 2 MG TABLET    Take 0.5 tablets (1 mg total) by mouth every 6 (six) hours as needed for severe pain.     Carmin Muskrat, MD 12/07/16 [place] [place]as

## 2016-12-07 NOTE — Telephone Encounter (Signed)
Pharmacist called to ensure EDP was aware of pt on benzo as he prescribed narcotic.  EDCM reviewed chart to see benzo (Ativan) listed as current med on EDP note- relayed information to Pharm-D.

## 2016-12-10 DIAGNOSIS — S42292A Other displaced fracture of upper end of left humerus, initial encounter for closed fracture: Secondary | ICD-10-CM | POA: Diagnosis not present

## 2016-12-21 ENCOUNTER — Other Ambulatory Visit: Payer: Self-pay | Admitting: Family Medicine

## 2016-12-28 DIAGNOSIS — S42292D Other displaced fracture of upper end of left humerus, subsequent encounter for fracture with routine healing: Secondary | ICD-10-CM | POA: Diagnosis not present

## 2017-01-02 ENCOUNTER — Other Ambulatory Visit: Payer: Self-pay | Admitting: Family Medicine

## 2017-01-03 NOTE — Telephone Encounter (Signed)
Electronic refill request. Lorazepam Last office visit:   09/28/16 Last Filled:    150 tablet 2 09/14/2016  Please advise.

## 2017-01-04 NOTE — Telephone Encounter (Signed)
Medication phoned to pharmacy. Left detailed message on voicemail.

## 2017-01-04 NOTE — Telephone Encounter (Signed)
Left detailed message on voicemail for patient to call back. (on DPR)

## 2017-01-04 NOTE — Telephone Encounter (Signed)
Please call in.  Thanks.  Due for diabetes checkup with A1c/labs ahead of time if possible

## 2017-01-05 ENCOUNTER — Ambulatory Visit: Payer: Medicare Other | Attending: Orthopedic Surgery | Admitting: Physical Therapy

## 2017-01-05 DIAGNOSIS — M25612 Stiffness of left shoulder, not elsewhere classified: Secondary | ICD-10-CM | POA: Diagnosis not present

## 2017-01-05 DIAGNOSIS — M25512 Pain in left shoulder: Secondary | ICD-10-CM | POA: Insufficient documentation

## 2017-01-05 NOTE — Therapy (Signed)
[facility] [place], [place], [address] Phone: [phone]   Fax:  [phone]  Physical Therapy Evaluation  Patient Details  Name: [name] MRN: [id number] Date of Birth: 1940/08/02 Referring Provider: Victorino December MD.  Encounter Date: 01/05/2017      PT End of Session - 01/05/17 1420    Visit Number 1   Number of Visits 12   Date for PT Re-Evaluation 02/02/17   PT Start Time 1122   PT Stop Time 1212   PT Time Calculation (min) 50 min   Activity Tolerance Patient tolerated treatment well   Behavior During Therapy [facility] for tasks assessed/performed      Past Medical History:  Diagnosis Date  . Anemia   . Anxiety   . Blood transfusion   . Blood transfusion without reported diagnosis 1983   prbc  . Breast cancer ([place]) 03/01/12   left breast lumpectomy=invasive ductal ca,grade I/III,INVILVES SKELATAL MUSCLE   er=+,pr=NEG. HER2 NEG.  . Cancer ([place]) 08/09/11 DX    LEFT  . Depression   . Diabetes mellitus 2011   type II, on pills   . Diverticulosis   . Dyspnea    with exertion if walks a long ways per patient   . GERD (gastroesophageal reflux disease)   . Headache   . History of radiation therapy    RIGHT JAW 5 TXS FOR ABSESSED TOOTH IN HER 30'S   . History of radiation therapy 04/10/12-05/26/12   left breast  . Hyperlipidemia   . Hypertension   . Insomnia, unspecified   . Osteoporosis, unspecified    with prev fosamax treatment  . Other abnormal glucose   . PONV (postoperative nausea and vomiting)     Past Surgical History:  Procedure Laterality Date  . ABDOMINAL HYSTERECTOMY  1983   b/ls alpingooph orectomy 4 years later  . BILATERAL OOPHORECTOMY  1987  . BREAST LUMPECTOMY  03/01/12   LEFT=INVASIVE DUCTAL CA,gRADE i/iii,INVASION AT PSTERIOR RESECTION MARGIN AND INVLOVES SKE;ATAL MUSCLE,PERINEURAL INVASION IDENTIFIED  . INGUINAL HERNIA REPAIR Left 1999   LIH  . ORIF FEMUR FRACTURE Right     titanium rod  . SIGMOIDOSCOPY    . TUBAL LIGATION  1975    There were no vitals filed for this visit.       Subjective Assessment - 01/05/17 1422    Subjective The patient states on 12/09/16 she was on a stool and fell from it fracturing her left proximal humerus. She was placed in a sling which she is now out of.  She states it is healing well.  Her pain at rest is a 2/10 and higher (5+/10) with movement.  Rest decreases her pain.   Pertinent History OP.   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   Currently in Pain? Yes   Pain Score 2    Pain Location Shoulder   Pain Orientation Left   Pain Descriptors / Indicators Aching   Pain Type Acute pain   Pain Onset 1 to 4 weeks ago   Pain Frequency Constant   Aggravating Factors  See above.   Pain Relieving Factors See above.   Multiple Pain Sites No            [facility] PT Assessment - 01/05/17 0001      Assessment   Medical Diagnosis Closed displaced fracture of proximal end of left humerus.   Referring Provider Victorino December MD.   Onset Date/Surgical Date --  12/09/16.     Precautions   Precautions --  OP.   Precaution Comments Begin with gentle left shoulder PAROM.     Restrictions   Weight Bearing Restrictions No     Balance Screen   Has the patient fallen in the past 6 months Yes   How many times? --  1.   Has the patient had a decrease in activity level because of a fear of falling?  No   Is the patient reluctant to leave their home because of a fear of falling?  No     Home Environment   Living Environment Private residence     Prior Function   Level of Independence Independent     Observation/Other Assessments   Observations Ecchymotic into left UE.     Posture/Postural Control   Posture Comments Patient holds left arm against abdomen.     ROM / Strength   AROM / PROM / Strength PROM     PROM   Overall PROM Comments In supine left shoulder flexion= 60 degrees and ER= 19  degrees.     Palpation   Palpation comment Tender to palpation in posterior proximal humeral region.     Ambulation/Gait   Gait Comments WNL.            Objective measurements completed on examination: See above findings.          [facility] Adult PT Treatment/Exercise - 01/05/17 0001      Modalities   Modalities Electrical Stimulation;Moist Heat     Moist Heat Therapy   Number Minutes Moist Heat 20 Minutes   Moist Heat Location --  Left shoulder.     Acupuncturist Location Left shoulder.   Electrical Stimulation Action IFC   Electrical Stimulation Parameters 80-150 Hz x 20 minutes.   Electrical Stimulation Goals Pain                PT Education - 01/05/17 1436    Education provided Yes   Education Details HEP.   Person(s) Educated Patient   Methods Explanation;Demonstration;Tactile cues;Verbal cues;Handout   Comprehension Need further instruction;Tactile cues required;Verbal cues required;Returned demonstration;Verbalized understanding          PT Short Term Goals - 01/05/17 1434      PT SHORT TERM GOAL #1   Title STG's=LTG's.           PT Long Term Goals - 01/05/17 1434      PT LONG TERM GOAL #1   Title Independent with a HEP.   Time 4   Period Weeks   Status New     PT LONG TERM GOAL #2   Title Active left shoulder flexion to 145 degrees so the patient can easily reach overhead.   Time 4   Period Weeks   Status New     PT LONG TERM GOAL #3   Title Active ER to 70 degrees+ to allow for easily donning/doffing of apparel.   Time 4   Period Weeks   Status New     PT LONG TERM GOAL #4   Title Increase ROM so patient is able to reach behind back to l4.   Time 4   Period Weeks   Status New     PT LONG TERM GOAL #5   Title Increase left shoulder strength to a solid 4/5 to increase stability for performance of functional activities.   Time 4   Period Weeks   Status New  Plan - 01/05/17 1430    Clinical Impression Statement The patient fell on 12/09/16 and sustained a left proximal humeral fracture.  Currently she has a significant loss of right shoulder range of motion and she is able to functionally use her left UE to perform ADL's.  Patient will benefit from skilled physical to address deficits.   History and Personal Factors relevant to plan of care: OP.   Clinical Presentation Stable   Clinical Presentation due to: Left humeral fracture is healing.   Clinical Decision Making Low   Rehab Potential Good   PT Frequency 3x / week   PT Duration 4 weeks   PT Treatment/Interventions ADLs/Self Care Home Management;Cryotherapy;Electrical Stimulation;Moist Heat;Therapeutic activities;Therapeutic exercise;Neuromuscular re-education;Patient/family education;Passive range of motion;Manual techniques   PT Next Visit Plan Begin with gentle left shoulder P and PAROM.  Please review and establish HEP.  Okay to begin cane exercises in supine.  Electrical stimulation.   Consulted and Agree with Plan of Care Patient      Patient will benefit from skilled therapeutic intervention in order to improve the following deficits and impairments:  Decreased activity tolerance, Pain, Decreased range of motion  Visit Diagnosis: Acute pain of left shoulder - Plan: PT plan of care cert/re-cert  Stiffness of left shoulder, not elsewhere classified - Plan: PT plan of care cert/re-cert      G-Codes - 63/33/54 1425    Functional Assessment Tool Used (Outpatient Only) FOTO...77% limitation.   Functional Limitation Self care   Self Care Current Status [phone]) At least 60 percent but less than 80 percent impaired, limited or restricted   Self Care Goal Status (L8937) At least 20 percent but less than 40 percent impaired, limited or restricted       Problem List Patient Active Problem List   Diagnosis Date Noted  . Left knee pain 09/30/2016  . Hypertension   . Colovaginal fistula  s/p LAR rectosigmoid resection & colovaginal fistula repair/omentopexy 05/14/2016 05/14/2016  . Back spasm 08/05/2015  . Dysuria 08/05/2015  . Advance care planning 02-16-15  . Fall 02-16-2015  . Sudden death of family member 22-Feb-2014  . Medicare annual wellness visit, initial 10/08/2013  . BRBPR (bright red blood per rectum) 03/28/2013  . Chest wall pain 02/23/2012  . Breast cancer of upper-inner quadrant of left female breast ([place]) 08/12/2011  . Normal echocardiogram 12/07/2010  . Iron deficiency anemia 11/20/2010  . Femur fracture, right ([place]) 11/20/2010  . SHOULDER PAIN, LEFT 07/03/2010  . DM (diabetes mellitus) ([place]) Feb 22, 2010  . HLD (hyperlipidemia) 01/27/2010  . GERD 01/27/2010  . Osteoporosis 01/27/2010  . Insomnia 01/27/2010  . Anxiety state 01/02/2010    Wonda Goodgame, [place] MPT 01/05/2017, 2:40 PM  [facility] [address] [place], [place], [address] Phone: [phone]   Fax:  [phone]  Name: TARNISHA KACHMAR MRN: [id number] Date of Birth: 04-Jul-1940

## 2017-01-07 ENCOUNTER — Ambulatory Visit (INDEPENDENT_AMBULATORY_CARE_PROVIDER_SITE_OTHER): Payer: Medicare Other | Admitting: Family Medicine

## 2017-01-07 ENCOUNTER — Encounter: Payer: Self-pay | Admitting: Family Medicine

## 2017-01-07 VITALS — BP 160/78 | HR 57 | Temp 98.2°F | Wt 132.5 lb

## 2017-01-07 DIAGNOSIS — E119 Type 2 diabetes mellitus without complications: Secondary | ICD-10-CM

## 2017-01-07 DIAGNOSIS — F411 Generalized anxiety disorder: Secondary | ICD-10-CM

## 2017-01-07 DIAGNOSIS — S42202D Unspecified fracture of upper end of left humerus, subsequent encounter for fracture with routine healing: Secondary | ICD-10-CM

## 2017-01-07 DIAGNOSIS — I10 Essential (primary) hypertension: Secondary | ICD-10-CM | POA: Diagnosis not present

## 2017-01-07 LAB — HEMOGLOBIN A1C: HEMOGLOBIN A1C: 7.5 % — AB (ref 4.6–6.5)

## 2017-01-07 NOTE — Patient Instructions (Addendum)
Don't wear the sling all of the time but okay to use some of the time.  Continue with therapy.   Go to the lab on the way out.  We'll contact you with your lab report. Rosaria Ferries will call about your referral. Take care.  Glad to see you.  Update me as needed.   Plan on recheck in 3-4 months.

## 2017-01-07 NOTE — Progress Notes (Signed)
L arm bruised- with proximal humerus fracture- she fell off a stool, standing on it- reaching for an object on a high shelf.  Seen at ER.  Proximal humerus fracture, not casted.  D/w pt.  Imaging d/w pt, reviewed at office visit.  Still with pain.  Was prev in a sling.  Fall cautions d/w pt.   Still taking hydrocodone for pain, no ADE on med.  She doesn't drive after taking med.    Anxiety and mood.  Lorazepam rx- she had trouble getting filled- pharmacy out of stock.  D/w pt.  Fall cautions d/w pt. She is trying to use less.  Mood is reasonable, occ lower sometimes, but "it comes and goes."  Still on SSRI and wellbutrin, compliant.  Diabetes:  Using medications without difficulties:yes 4 metformin a day.   Hypoglycemic episodes:no Hyperglycemic episodes:no Feet problems: L 1st medial toe irritated and marginally ingrown.  D/w pt.   Blood Sugars averaging: 120-160s.  Due for labs.   Weight slightly lower than prev.    Hypertension:    Using medication without problems or lightheadedness: yes Chest pain with exertion:no Edema:no Short of breath:no Recheck 160/78 R arm.  L arm fx noted.    R knee injected by ortho, with relief of pain.    PMH and SH reviewed  Meds, vitals, and allergies reviewed.   ROS: Per HPI unless specifically indicated in ROS section   GEN: nad, alert and oriented HEENT: mucous membranes moist NECK: supple w/o LA CV: rrr. PULM: ctab, no inc wob ABD: soft, +bs EXT: no edema SKIN: no acute rash Left shoulder with pain on range of motion. Left upper arm with bruising noted. Grip intact. L 1st medial toe irritated and marginally ingrown.

## 2017-01-09 ENCOUNTER — Encounter: Payer: Self-pay | Admitting: Family Medicine

## 2017-01-09 DIAGNOSIS — S42202A Unspecified fracture of upper end of left humerus, initial encounter for closed fracture: Secondary | ICD-10-CM | POA: Insufficient documentation

## 2017-01-09 NOTE — Assessment & Plan Note (Signed)
She is going to check on lorazepam availability at the pharmacy. She is compliant with SSRI and Wellbutrin, with some relief. She is trying to taper down her benzodiazepine use. It does not appear that her benzodiazepine use contributed to the fall. This was an accidental mechanical issue. Discussed. She agrees.

## 2017-01-09 NOTE — Assessment & Plan Note (Signed)
She is doing that she can with diet. Exercise is limited due to her arm pain. Refer to podiatry about the mild nail changes that she has. No change in medication at this point. See notes on labs. She agrees. See after visit summary.

## 2017-01-09 NOTE — Assessment & Plan Note (Signed)
I don't want to induce hypotension. Discussed with patient. She agrees. Recheck blood pressure is slightly better. This is with significant pain that likely contributes. Continue as is. She agrees.

## 2017-01-09 NOTE — Assessment & Plan Note (Signed)
She has seen orthopedics in the meantime. She has pain medicine to use. Fall cautions and safety discussed with patient. Discussed with her about pendulum swings to keep her shoulder loose. She can use a sling as needed but I don't want her to get a frozen shoulder. Discussed with patient. She agrees.

## 2017-01-10 ENCOUNTER — Encounter: Payer: Self-pay | Admitting: Physical Therapy

## 2017-01-10 ENCOUNTER — Ambulatory Visit: Payer: Medicare Other | Admitting: Physical Therapy

## 2017-01-10 ENCOUNTER — Telehealth: Payer: Self-pay | Admitting: Family Medicine

## 2017-01-10 DIAGNOSIS — M25512 Pain in left shoulder: Secondary | ICD-10-CM

## 2017-01-10 DIAGNOSIS — M25612 Stiffness of left shoulder, not elsewhere classified: Secondary | ICD-10-CM | POA: Diagnosis not present

## 2017-01-10 MED ORDER — LORAZEPAM 2 MG PO TABS: ORAL_TABLET | ORAL | 2 refills | Status: DC

## 2017-01-10 NOTE — Telephone Encounter (Signed)
Medication phoned to pharmacy, [facility], [place].

## 2017-01-10 NOTE — Therapy (Signed)
[facility] [place], [place], [address] Phone: [phone]   Fax:  [phone]  Physical Therapy Treatment  Patient Details  Name: [name] MRN: [id number] Date of Birth: 1941/06/08 Referring Provider: Victorino December MD.  Encounter Date: 01/10/2017      PT End of Session - 01/10/17 1330    Visit Number 2   Number of Visits 12   Date for PT Re-Evaluation 02/02/17   PT Start Time 1300   PT Stop Time 1344   PT Time Calculation (min) 44 min   Activity Tolerance Patient tolerated treatment well   Behavior During Therapy [facility] for tasks assessed/performed      Past Medical History:  Diagnosis Date  . Anemia   . Anxiety   . Blood transfusion   . Blood transfusion without reported diagnosis 1983   prbc  . Breast cancer ([place]) 03/01/12   left breast lumpectomy=invasive ductal ca,grade I/III,INVILVES SKELATAL MUSCLE   er=+,pr=NEG. HER2 NEG.  . Cancer ([place]) 08/09/11 DX    LEFT  . Depression   . Diabetes mellitus 2011   type II, on pills   . Diverticulosis   . Dyspnea    with exertion if walks a long ways per patient   . GERD (gastroesophageal reflux disease)   . Headache   . History of radiation therapy    RIGHT JAW 5 TXS FOR ABSESSED TOOTH IN HER 30'S   . History of radiation therapy 04/10/12-05/26/12   left breast  . Hyperlipidemia   . Hypertension   . Insomnia, unspecified   . Osteoporosis, unspecified    with prev fosamax treatment  . Other abnormal glucose   . PONV (postoperative nausea and vomiting)     Past Surgical History:  Procedure Laterality Date  . ABDOMINAL HYSTERECTOMY  1983   b/ls alpingooph orectomy 4 years later  . BILATERAL OOPHORECTOMY  1987  . BREAST LUMPECTOMY  03/01/12   LEFT=INVASIVE DUCTAL CA,gRADE i/iii,INVASION AT PSTERIOR RESECTION MARGIN AND INVLOVES SKE;ATAL MUSCLE,PERINEURAL INVASION IDENTIFIED  . INGUINAL HERNIA REPAIR Left 1999   LIH  . ORIF FEMUR FRACTURE Right    titanium rod  . SIGMOIDOSCOPY    . TUBAL LIGATION  1975    There were no vitals filed for this visit.      Subjective Assessment - 01/10/17 1306    Subjective Patient reported ongoing soreness in shoulder and has been using shoulder to pick up purse and other heavy objects not thinking about it   Pertinent History OP.   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   Currently in Pain? Yes   Pain Score 2    Pain Location Shoulder   Pain Orientation Left   Pain Descriptors / Indicators Aching   Pain Type Acute pain   Pain Onset 1 to 4 weeks ago   Pain Frequency Constant   Aggravating Factors  flexion movement   Pain Relieving Factors at rest            [facility] PT Assessment - 01/10/17 0001      ROM / Strength   AROM / PROM / Strength AROM;PROM     PROM   PROM Assessment Site Shoulder   Right/Left Shoulder Left   Left Shoulder Flexion 82 Degrees   Left Shoulder External Rotation 62 Degrees                     [facility] Adult PT Treatment/Exercise -  01/10/17 0001      Exercises   Exercises Shoulder     Shoulder Exercises: Supine   Flexion Limitations   Flexion Limitations unable to perform today, attempted with cane for active assistive     Electrical Stimulation   Electrical Stimulation Location Left shoulder.   Electrical Stimulation Action IFC   Electrical Stimulation Parameters 80-150hz  x78mn   Electrical Stimulation Goals Pain     Manual Therapy   Manual Therapy Passive ROM   Passive ROM gentle manual PROM to left shoulder flexion/ER/IR with gentle range                  PT Short Term Goals - 01/05/17 1434      PT SHORT TERM GOAL #1   Title STG's=LTG's.           PT Long Term Goals - 01/10/17 1331      PT LONG TERM GOAL #1   Title Independent with a HEP.   Time 4   Period Weeks   Status On-going     PT LONG TERM GOAL #2   Title Active left shoulder flexion to 145 degrees so the patient can easily  reach overhead.   Time 4   Period Weeks   Status On-going     PT LONG TERM GOAL #3   Title Active ER to 70 degrees+ to allow for easily donning/doffing of apparel.   Time 4   Period Weeks   Status On-going     PT LONG TERM GOAL #4   Title Increase ROM so patient is able to reach behind back to l4.   Time 4   Period Weeks   Status On-going     PT LONG TERM GOAL #5   Title Increase left shoulder strength to a solid 4/5 to increase stability for performance of functional activities.   Time 4   Period Weeks   Status On-going               Plan - 01/10/17 1331    Clinical Impression Statement Patient tolerated treatment fairly well today. Patient improved with ER motion today with little discomfort. Patient has also improved shoulder flexion yet some discomfort with motion, patient also had increased guarding with flexion and required ossilations to relax. Patient reported doing HEP at home as directed. Patient current goals ongoing due to deficts.    Rehab Potential Good   PT Frequency 3x / week   PT Duration 4 weeks   PT Treatment/Interventions ADLs/Self Care Home Management;Cryotherapy;Electrical Stimulation;Moist Heat;Therapeutic activities;Therapeutic exercise;Neuromuscular re-education;Patient/family education;Passive range of motion;Manual techniques   PT Next Visit Plan cont with left shoulder P and PAROM.  Please review and establish HEP.  Okay to begin cane exercises in supine.  Electrical stimulation.   Consulted and Agree with Plan of Care Patient      Patient will benefit from skilled therapeutic intervention in order to improve the following deficits and impairments:  Decreased activity tolerance, Pain, Decreased range of motion  Visit Diagnosis: Acute pain of left shoulder  Stiffness of left shoulder, not elsewhere classified     Problem List Patient Active Problem List   Diagnosis Date Noted  . Closed fracture of left proximal humerus 01/09/2017  .  Left knee pain 09/30/2016  . Hypertension   . Colovaginal fistula s/p LAR rectosigmoid resection & colovaginal fistula repair/omentopexy 05/14/2016 05/14/2016  . Back spasm 08/05/2015  . Dysuria 08/05/2015  . Advance care planning 01/22/2015  . Fall 01/22/2015  .  Sudden death of family member 02-05-14  . Medicare annual wellness visit, initial 10/08/2013  . BRBPR (bright red blood per rectum) 03/28/2013  . Chest wall pain 02/23/2012  . Breast cancer of upper-inner quadrant of left female breast ([place]) 08/12/2011  . Normal echocardiogram 12/07/2010  . Iron deficiency anemia 11/20/2010  . Femur fracture, right ([place]) 11/20/2010  . SHOULDER PAIN, LEFT 07/03/2010  . DM (diabetes mellitus) ([place]) 02/05/2010  . HLD (hyperlipidemia) 01/27/2010  . GERD 01/27/2010  . Osteoporosis 01/27/2010  . Insomnia 01/27/2010  . Anxiety state 01/02/2010    Phillips Climes, PTA 01/10/2017, 1:47 PM  [facility] [place], [place], [address] Phone: [phone]   Fax:  [phone]  Name: [name] MRN: [id number] Date of Birth: September 17, 1940

## 2017-01-10 NOTE — Telephone Encounter (Signed)
See mychart message. Her usual pharmacy doesn't have lorazepam 2mg .  [facility] [place]- let pt know if they don't have it since she'll need to get it at another pharmacy.  She hasn't had this filled this month.  Thanks.

## 2017-01-11 ENCOUNTER — Encounter: Payer: Self-pay | Admitting: Family Medicine

## 2017-01-11 DIAGNOSIS — S42292D Other displaced fracture of upper end of left humerus, subsequent encounter for fracture with routine healing: Secondary | ICD-10-CM | POA: Diagnosis not present

## 2017-01-11 MED ORDER — ALPRAZOLAM 1 MG PO TABS: 0.5000 mg | ORAL_TABLET | Freq: Three times a day (TID) | ORAL | 0 refills | Status: DC | PRN

## 2017-01-11 NOTE — Telephone Encounter (Addendum)
Change to xanax, please call in short term rx.  Have her start with 1/2 tab per dose since she had med change.  Thanks.

## 2017-01-11 NOTE — Telephone Encounter (Signed)
Medication phoned to pharmacy. Patient advised.  

## 2017-01-11 NOTE — Addendum Note (Signed)
Addended by: Tonia Ghent on: 01/11/2017 03:12 PM   Modules accepted: Orders

## 2017-01-11 NOTE — Telephone Encounter (Addendum)
[facility] [place] said could be 4 - 5 days before can get the lorazepam; [facility] does not have l[facility]. Pt wants to know if different med could be called to [place]. Pt has one lorazepam left. Pt has tried xanax before and that worked well. Would like xanax equivalent to lorazepam dosage. Pt request cb.

## 2017-01-12 ENCOUNTER — Telehealth: Payer: Self-pay | Admitting: *Deleted

## 2017-01-12 NOTE — Telephone Encounter (Signed)
See phone note regarding call to pharmacy.

## 2017-01-12 NOTE — Telephone Encounter (Signed)
Pt left v/m requesting cb at [phone] when someone calls pts meds in to pharmacy. Pt is out of med and cannot wait until Dr Josefine Class return.

## 2017-01-12 NOTE — Telephone Encounter (Signed)
Pt left v/m; pt said to cancel refill request; pt has found what she needed. Nothing further needed.

## 2017-01-12 NOTE — Telephone Encounter (Signed)
Thanks

## 2017-01-12 NOTE — Telephone Encounter (Signed)
[facility] left v/m for ins purpose if opiod, Hydrocodone apap and benzo, xanax or lorazepam can be given together; ins requires documentation of this.Please advise.

## 2017-01-12 NOTE — Telephone Encounter (Signed)
Please call pharmacy and cancel the Xanax prescription. FYI to PCP.

## 2017-01-12 NOTE — Telephone Encounter (Signed)
See previous messages regarding medications. Did not call script into pharmacy after discussing with Allie Bossier NP per patient's last phone call.

## 2017-01-12 NOTE — Telephone Encounter (Addendum)
Okay to switch to xanax as prescribed by PCP. Cannot take Lorazepam and Xanax together. Based off of the [place] Controlled Substance Registry she is getting hydrocodone from a Dr. Stann Mainland in [place], looks like he's an orthopedist. No suspicious activity with lorazepam noted online.

## 2017-01-12 NOTE — Telephone Encounter (Signed)
See my chart message. Called and [facility] and was advised that patient is on Alprazolam and Lorazepam and wanted to make sure that Dr. Damita Dunnings is aware of that and should patient be taking both?  Belenda Cruise also said that patient is getting Hydrocodone from Victorino December and got #60 on 01/11/17 and #60 on 12/30/16.

## 2017-01-12 NOTE — Telephone Encounter (Signed)
Pt left v/m requesting cb at [phone] when someone calls pts meds in to pharmacy. Pt is out of med and cannot wait until Dr Josefine Class return.

## 2017-01-20 ENCOUNTER — Ambulatory Visit: Payer: Medicare Other | Attending: Orthopedic Surgery

## 2017-01-20 DIAGNOSIS — M25612 Stiffness of left shoulder, not elsewhere classified: Secondary | ICD-10-CM | POA: Diagnosis not present

## 2017-01-20 DIAGNOSIS — M25512 Pain in left shoulder: Secondary | ICD-10-CM

## 2017-01-20 NOTE — Therapy (Signed)
[facility] [place], [place], [address] Phone: [phone]   Fax:  [phone]  Physical Therapy Treatment  Patient Details  Name: [name] MRN: [id number] Date of Birth: [birth year] Referring Provider: Victorino December, MD   Encounter Date: 01/20/2017      PT End of Session - 01/20/17 1410    Visit Number 3   Number of Visits 12   Date for PT Re-Evaluation 02/02/17   PT Start Time 9983   PT Stop Time 1445   PT Time Calculation (min) 57 min   Activity Tolerance Patient tolerated treatment well   Behavior During Therapy [facility] for tasks assessed/performed      Past Medical History:  Diagnosis Date  . Anemia   . Anxiety   . Blood transfusion   . Blood transfusion without reported diagnosis 1983   prbc  . Breast cancer ([place]) 03/01/12   left breast lumpectomy=invasive ductal ca,grade I/III,INVILVES SKELATAL MUSCLE   er=+,pr=NEG. HER2 NEG.  . Cancer ([place]) 08/09/11 DX    LEFT  . Depression   . Diabetes mellitus 2011   type II, on pills   . Diverticulosis   . Dyspnea    with exertion if walks a long ways per patient   . GERD (gastroesophageal reflux disease)   . Headache   . History of radiation therapy    RIGHT JAW 5 TXS FOR ABSESSED TOOTH IN HER 30'S   . History of radiation therapy 04/10/12-05/26/12   left breast  . Hyperlipidemia   . Hypertension   . Insomnia, unspecified   . Osteoporosis, unspecified    with prev fosamax treatment  . Other abnormal glucose   . PONV (postoperative nausea and vomiting)     Past Surgical History:  Procedure Laterality Date  . ABDOMINAL HYSTERECTOMY  1983   b/ls alpingooph orectomy 4 years later  . BILATERAL OOPHORECTOMY  1987  . BREAST LUMPECTOMY  03/01/12   LEFT=INVASIVE DUCTAL CA,gRADE i/iii,INVASION AT PSTERIOR RESECTION MARGIN AND INVLOVES SKE;ATAL MUSCLE,PERINEURAL INVASION IDENTIFIED  . INGUINAL HERNIA REPAIR Left 1999   LIH  . ORIF FEMUR FRACTURE Right    titanium rod  . SIGMOIDOSCOPY    . TUBAL LIGATION  1975    There were no vitals filed for this visit.      Subjective Assessment - 01/20/17 1356    Subjective Pt. doing well today.     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   Currently in Pain? No/denies   Pain Score 0-No pain   Multiple Pain Sites No            [facility] PT Assessment - 01/20/17 1357      Assessment   Medical Diagnosis Closed displaced fracture of proximal end of left humerus.   Referring Provider Victorino December, MD    Next MD Visit 8.31.18                     [facility] Adult PT Treatment/Exercise - 01/20/17 1359      Shoulder Exercises: Supine   External Rotation AAROM;15 reps;Left   Theraband Level (Shoulder External Rotation) --  want    Flexion AAROM;10 reps   Flexion Limitations wand; ~ 95 dg to tolerance  cues to avoid painful arc   Other Supine Exercises Supine L shoulder circles in elevated 90 dg flexion position (8" circles) CW, CCW x 20 reps each way     Moist Heat Therapy  Number Minutes Moist Heat 15 Minutes   Moist Heat Location Shoulder  L      Electrical Stimulation   Electrical Stimulation Location Left shoulder.   Electrical Stimulation Action IFC   Electrical Stimulation Parameters 80-150Hz , intensity to pt. tolerance, 15'    Electrical Stimulation Goals Pain;Tone     Manual Therapy   Manual Therapy Passive ROM;Soft tissue mobilization   Manual therapy comments Supine    Soft tissue mobilization STM to posterior RTC in area of tenderness to decrease tone    Passive ROM gentle manual PROM to left shoulder flexion/ER/IR with gentle range                  PT Short Term Goals - 01/05/17 1434      PT SHORT TERM GOAL #1   Title STG's=LTG's.           PT Long Term Goals - 01/10/17 1331      PT LONG TERM GOAL #1   Title Independent with a HEP.   Time 4   Period Weeks   Status On-going     PT LONG TERM GOAL #2   Title  Active left shoulder flexion to 145 degrees so the patient can easily reach overhead.   Time 4   Period Weeks   Status On-going     PT LONG TERM GOAL #3   Title Active ER to 70 degrees+ to allow for easily donning/doffing of apparel.   Time 4   Period Weeks   Status On-going     PT LONG TERM GOAL #4   Title Increase ROM so patient is able to reach behind back to l4.   Time 4   Period Weeks   Status On-going     PT LONG TERM GOAL #5   Title Increase left shoulder strength to a solid 4/5 to increase stability for performance of functional activities.   Time 4   Period Weeks   Status On-going               Plan - 01/20/17 1421    Clinical Impression Statement Pt. doing well today.  Tolerated all PROM and gentle want AAROM activities well today.  Supine shoulder circles well tolerated.  Treatment ending with moist heat/ E-stim. to decrease post-exercise pain and swelling.     PT Treatment/Interventions ADLs/Self Care Home Management;Cryotherapy;Electrical Stimulation;Moist Heat;Therapeutic activities;Therapeutic exercise;Neuromuscular re-education;Patient/family education;Passive range of motion;Manual techniques   PT Next Visit Plan cont with left shoulder P and PAROM.  Please review and establish HEP.  Continue cane exercises in supine.  Electrical stimulation.      Patient will benefit from skilled therapeutic intervention in order to improve the following deficits and impairments:  Decreased activity tolerance, Pain, Decreased range of motion  Visit Diagnosis: Acute pain of left shoulder  Stiffness of left shoulder, not elsewhere classified     Problem List Patient Active Problem List   Diagnosis Date Noted  . Closed fracture of left proximal humerus 01/09/2017  . Left knee pain 09/30/2016  . Hypertension   . Colovaginal fistula s/p LAR rectosigmoid resection & colovaginal fistula repair/omentopexy 05/14/2016 05/14/2016  . Back spasm 08/05/2015  . Dysuria  08/05/2015  . Advance care planning 02-21-15  . Fall Feb 21, 2015  . Sudden death of family member 02-27-14  . Medicare annual wellness visit, initial 10/08/2013  . BRBPR (bright red blood per rectum) 03/28/2013  . Chest wall pain 02/23/2012  . Breast cancer of upper-inner quadrant of left female  breast ([place]) 08/12/2011  . Normal echocardiogram 12/07/2010  . Iron deficiency anemia 11/20/2010  . Femur fracture, right ([place]) 11/20/2010  . SHOULDER PAIN, LEFT 07/03/2010  . DM (diabetes mellitus) ([place]) 01/28/2010  . HLD (hyperlipidemia) 01/27/2010  . GERD 01/27/2010  . Osteoporosis 01/27/2010  . Insomnia 01/27/2010  . Anxiety state 01/02/2010    Bess Harvest, PTA 01/20/17 5:44 PM   [facility] [place], [place], [address] Phone: [phone]   Fax:  [phone]  Name: KALEISHA BHARGAVA MRN: [id number] Date of Birth: 1941-05-23

## 2017-01-27 ENCOUNTER — Encounter: Payer: Medicare Other | Admitting: Physical Therapy

## 2017-02-01 ENCOUNTER — Other Ambulatory Visit: Payer: Self-pay | Admitting: Family Medicine

## 2017-02-01 ENCOUNTER — Other Ambulatory Visit: Payer: Self-pay | Admitting: Adult Health

## 2017-02-01 DIAGNOSIS — Z853 Personal history of malignant neoplasm of breast: Secondary | ICD-10-CM

## 2017-02-02 ENCOUNTER — Encounter: Payer: Self-pay | Admitting: Physical Therapy

## 2017-02-02 ENCOUNTER — Ambulatory Visit: Payer: Medicare Other | Admitting: Physical Therapy

## 2017-02-02 DIAGNOSIS — M25512 Pain in left shoulder: Secondary | ICD-10-CM | POA: Diagnosis not present

## 2017-02-02 DIAGNOSIS — M25612 Stiffness of left shoulder, not elsewhere classified: Secondary | ICD-10-CM

## 2017-02-02 NOTE — Therapy (Signed)
[facility] [place], [place], [address] Phone: [phone]   Fax:  [phone]  Physical Therapy Treatment  Patient Details  Name: [name] MRN: [id number] Date of Birth: 12/20/1940 Referring Provider: Victorino December, MD   Encounter Date: 02/02/2017      PT End of Session - 02/02/17 1438    Visit Number 4   Number of Visits 12   Date for PT Re-Evaluation 02/02/17   PT Start Time 6962   PT Stop Time 1518   PT Time Calculation (min) 46 min   Activity Tolerance Patient tolerated treatment well   Behavior During Therapy [facility] for tasks assessed/performed      Past Medical History:  Diagnosis Date  . Anemia   . Anxiety   . Blood transfusion   . Blood transfusion without reported diagnosis 1983   prbc  . Breast cancer ([place]) 03/01/12   left breast lumpectomy=invasive ductal ca,grade I/III,INVILVES SKELATAL MUSCLE   er=+,pr=NEG. HER2 NEG.  . Cancer ([place]) 08/09/11 DX    LEFT  . Depression   . Diabetes mellitus 2011   type II, on pills   . Diverticulosis   . Dyspnea    with exertion if walks a long ways per patient   . GERD (gastroesophageal reflux disease)   . Headache   . History of radiation therapy    RIGHT JAW 5 TXS FOR ABSESSED TOOTH IN HER 30'S   . History of radiation therapy 04/10/12-05/26/12   left breast  . Hyperlipidemia   . Hypertension   . Insomnia, unspecified   . Osteoporosis, unspecified    with prev fosamax treatment  . Other abnormal glucose   . PONV (postoperative nausea and vomiting)     Past Surgical History:  Procedure Laterality Date  . ABDOMINAL HYSTERECTOMY  1983   b/ls alpingooph orectomy 4 years later  . BILATERAL OOPHORECTOMY  1987  . BREAST LUMPECTOMY  03/01/12   LEFT=INVASIVE DUCTAL CA,gRADE i/iii,INVASION AT PSTERIOR RESECTION MARGIN AND INVLOVES SKE;ATAL MUSCLE,PERINEURAL INVASION IDENTIFIED  . INGUINAL HERNIA REPAIR Left 1999   LIH  . ORIF FEMUR FRACTURE Right    titanium rod  . SIGMOIDOSCOPY    . TUBAL LIGATION  1975    There were no vitals filed for this visit.      Subjective Assessment - 02/02/17 1437    Subjective Reports she had some pains every once in a while yesterday.   Pertinent History OP.   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   Currently in Pain? No/denies            [facility] PT Assessment - 02/02/17 0001      Assessment   Medical Diagnosis Closed displaced fracture of proximal end of left humerus.   Onset Date/Surgical Date 12/06/16   Next MD Visit 02/19/2017     Precautions   Precaution Comments Begin with gentle left shoulder PAROM.     Restrictions   Weight Bearing Restrictions No                     [facility] Adult PT Treatment/Exercise - 02/02/17 0001      Shoulder Exercises: Pulleys   Flexion Other (comment)  x5 min     Modalities   Modalities Electrical Stimulation;Moist Heat     Moist Heat Therapy   Number Minutes Moist Heat 15 Minutes   Moist Heat Location Shoulder     Electrical Stimulation  Electrical Stimulation Location L shoulder   Electrical Stimulation Action Pre-Mod   Electrical Stimulation Parameters 80-150 hz x15 min   Electrical Stimulation Goals Pain     Manual Therapy   Manual Therapy Passive ROM   Passive ROM Gentle PROM of L shoulder into flexion and ER with gentle holds at end range                  PT Short Term Goals - 01/05/17 1434      PT SHORT TERM GOAL #1   Title STG's=LTG's.           PT Long Term Goals - 01/10/17 1331      PT LONG TERM GOAL #1   Title Independent with a HEP.   Time 4   Period Weeks   Status On-going     PT LONG TERM GOAL #2   Title Active left shoulder flexion to 145 degrees so the patient can easily reach overhead.   Time 4   Period Weeks   Status On-going     PT LONG TERM GOAL #3   Title Active ER to 70 degrees+ to allow for easily donning/doffing of apparel.   Time 4   Period  Weeks   Status On-going     PT LONG TERM GOAL #4   Title Increase ROM so patient is able to reach behind back to l4.   Time 4   Period Weeks   Status On-going     PT LONG TERM GOAL #5   Title Increase left shoulder strength to a solid 4/5 to increase stability for performance of functional activities.   Time 4   Period Weeks   Status On-going               Plan - 02/02/17 1516    Clinical Impression Statement Patient tolerated today's treatment fairly well although she intermittantly experienced L shoulder pain with PROM as well as pulleys. Intermittant pain occured at end range flexion during PROM and as pulley exercise progressed. Home pulley unit created by patient by Mali Applegate, MPT for patient's use at home to which she was instructed to complete several times a day for 5 minutes at a time. Patient very limited with flexion both passively and assisted with pulleys. Normal modalities response noted following removal of the modalities.   Rehab Potential Good   PT Frequency 3x / week   PT Duration 4 weeks   PT Treatment/Interventions ADLs/Self Care Home Management;Cryotherapy;Electrical Stimulation;Moist Heat;Therapeutic activities;Therapeutic exercise;Neuromuscular re-education;Patient/family education;Passive range of motion;Manual techniques   PT Next Visit Plan cont with left shoulder P and PAROM.  Please review and establish HEP.  Continue cane exercises in supine.  Electrical stimulation.   Consulted and Agree with Plan of Care Patient      Patient will benefit from skilled therapeutic intervention in order to improve the following deficits and impairments:  Decreased activity tolerance, Pain, Decreased range of motion  Visit Diagnosis: Acute pain of left shoulder  Stiffness of left shoulder, not elsewhere classified     Problem List Patient Active Problem List   Diagnosis Date Noted  . Closed fracture of left proximal humerus 01/09/2017  . Left knee pain  09/30/2016  . Hypertension   . Colovaginal fistula s/p LAR rectosigmoid resection & colovaginal fistula repair/omentopexy 05/14/2016 05/14/2016  . Back spasm 08/05/2015  . Dysuria 08/05/2015  . Advance care planning Feb 06, 2015  . Fall 02/06/2015  . Sudden death of family member Feb 12, 2014  . Medicare  annual wellness visit, initial 10/08/2013  . BRBPR (bright red blood per rectum) 03/28/2013  . Chest wall pain 02/23/2012  . Breast cancer of upper-inner quadrant of left female breast ([place]) 08/12/2011  . Normal echocardiogram 12/07/2010  . Iron deficiency anemia 11/20/2010  . Femur fracture, right ([place]) 11/20/2010  . SHOULDER PAIN, LEFT 07/03/2010  . DM (diabetes mellitus) ([place]) 01/28/2010  . HLD (hyperlipidemia) 01/27/2010  . GERD 01/27/2010  . Osteoporosis 01/27/2010  . Insomnia 01/27/2010  . Anxiety state 01/02/2010    Wynelle Fanny, PTA 02/02/2017, 3:22 PM  [facility] [address] [place], [place], [address] Phone: [phone]   Fax:  [phone]  Name: [name] MRN: [id number] Date of Birth: 1941/05/24

## 2017-02-04 ENCOUNTER — Other Ambulatory Visit: Payer: Self-pay | Admitting: Family Medicine

## 2017-02-08 ENCOUNTER — Ambulatory Visit: Payer: Medicare Other | Admitting: Physical Therapy

## 2017-02-08 DIAGNOSIS — M25512 Pain in left shoulder: Secondary | ICD-10-CM

## 2017-02-08 DIAGNOSIS — M25612 Stiffness of left shoulder, not elsewhere classified: Secondary | ICD-10-CM

## 2017-02-08 NOTE — Therapy (Signed)
[facility], [place], [address] Phone: [phone]   Fax:  [phone]  Physical Therapy Treatment  Patient Details  Name: [name] MRN: [id number] Date of Birth: 27-May-1941 Referring Provider: Victorino December, MD   Encounter Date: 02/08/2017      PT End of Session - 02/08/17 1613    Visit Number 6   Number of Visits 12   Date for PT Re-Evaluation 02/02/17   PT Start Time 0100   PT Stop Time 0158   PT Time Calculation (min) 58 min   Activity Tolerance Patient tolerated treatment well   Behavior During Therapy [facility] for tasks assessed/performed      Past Medical History:  Diagnosis Date  . Anemia   . Anxiety   . Blood transfusion   . Blood transfusion without reported diagnosis 1983   prbc  . Breast cancer ([place]) 03/01/12   left breast lumpectomy=invasive ductal ca,grade I/III,INVILVES SKELATAL MUSCLE   er=+,pr=NEG. HER2 NEG.  . Cancer ([place]) 08/09/11 DX    LEFT  . Depression   . Diabetes mellitus 2011   type II, on pills   . Diverticulosis   . Dyspnea    with exertion if walks a long ways per patient   . GERD (gastroesophageal reflux disease)   . Headache   . History of radiation therapy    RIGHT JAW 5 TXS FOR ABSESSED TOOTH IN HER 30'S   . History of radiation therapy 04/10/12-05/26/12   left breast  . Hyperlipidemia   . Hypertension   . Insomnia, unspecified   . Osteoporosis, unspecified    with prev fosamax treatment  . Other abnormal glucose   . PONV (postoperative nausea and vomiting)     Past Surgical History:  Procedure Laterality Date  . ABDOMINAL HYSTERECTOMY  1983   b/ls alpingooph orectomy 4 years later  . BILATERAL OOPHORECTOMY  1987  . BREAST LUMPECTOMY  03/01/12   LEFT=INVASIVE DUCTAL CA,gRADE i/iii,INVASION AT PSTERIOR RESECTION MARGIN AND INVLOVES SKE;ATAL MUSCLE,PERINEURAL INVASION IDENTIFIED  . INGUINAL HERNIA REPAIR Left 1999   LIH  . ORIF FEMUR FRACTURE Right    titanium rod  . SIGMOIDOSCOPY    . TUBAL LIGATION  1975    There were no vitals filed for this visit.      Subjective Assessment - 02/08/17 1557    Subjective I want to raise my arm better.   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   Pain Score 2    Pain Location Shoulder   Pain Orientation Left   Pain Descriptors / Indicators Aching   Pain Type Acute pain   Pain Onset 1 to 4 weeks ago                         [facility] Adult PT Treatment/Exercise - 02/08/17 0001      Moist Heat Therapy   Number Minutes Moist Heat 15 Minutes   Moist Heat Location --  Left shoulder.     Acupuncturist Location Left shoulder.   Electrical Stimulation Action IFC x 15 minutes.   Electrical Stimulation Goals Pain     Manual Therapy   Manual Therapy Passive ROM   Passive ROM PROM to patient's left shoulder in supine into flexion and ER x 30 minutes.                  PT Short Term  Goals - 01/05/17 1434      PT SHORT TERM GOAL #1   Title STG's=LTG's.           PT Long Term Goals - 01/10/17 1331      PT LONG TERM GOAL #1   Title Independent with a HEP.   Time 4   Period Weeks   Status On-going     PT LONG TERM GOAL #2   Title Active left shoulder flexion to 145 degrees so the patient can easily reach overhead.   Time 4   Period Weeks   Status On-going     PT LONG TERM GOAL #3   Title Active ER to 70 degrees+ to allow for easily donning/doffing of apparel.   Time 4   Period Weeks   Status On-going     PT LONG TERM GOAL #4   Title Increase ROM so patient is able to reach behind back to l4.   Time 4   Period Weeks   Status On-going     PT LONG TERM GOAL #5   Title Increase left shoulder strength to a solid 4/5 to increase stability for performance of functional activities.   Time 4   Period Weeks   Status On-going               Plan - 02/08/17 1613    Clinical Impression  Statement Goo dimprovement in range of motion but patient needs continued stretching.  Encouraged her to perform her over the door pulley system 3 times a day for 5 minutes each.      Patient will benefit from skilled therapeutic intervention in order to improve the following deficits and impairments:  Decreased activity tolerance, Pain, Decreased range of motion  Visit Diagnosis: Stiffness of left shoulder, not elsewhere classified  Acute pain of left shoulder     Problem List Patient Active Problem List   Diagnosis Date Noted  . Closed fracture of left proximal humerus 01/09/2017  . Left knee pain 09/30/2016  . Hypertension   . Colovaginal fistula s/p LAR rectosigmoid resection & colovaginal fistula repair/omentopexy 05/14/2016 05/14/2016  . Back spasm 08/05/2015  . Dysuria 08/05/2015  . Advance care planning February 18, 2015  . Fall February 18, 2015  . Sudden death of family member 02/24/14  . Medicare annual wellness visit, initial 10/08/2013  . BRBPR (bright red blood per rectum) 03/28/2013  . Chest wall pain 02/23/2012  . Breast cancer of upper-inner quadrant of left female breast ([place]) 08/12/2011  . Normal echocardiogram 12/07/2010  . Iron deficiency anemia 11/20/2010  . Femur fracture, right ([place]) 11/20/2010  . SHOULDER PAIN, LEFT 07/03/2010  . DM (diabetes mellitus) ([place]) 02/24/2010  . HLD (hyperlipidemia) 01/27/2010  . GERD 01/27/2010  . Osteoporosis 01/27/2010  . Insomnia 01/27/2010  . Anxiety state 01/02/2010    [name], [name] 02/08/2017, 4:16 PM  [name] [address] [place], [place], [address] Phone: [phone]   Fax:  [phone]  Name: [name] MRN: [id number] Date of Birth: 1941-02-18

## 2017-02-17 ENCOUNTER — Encounter: Payer: Medicare Other | Admitting: Physical Therapy

## 2017-02-17 DIAGNOSIS — S42292D Other displaced fracture of upper end of left humerus, subsequent encounter for fracture with routine healing: Secondary | ICD-10-CM | POA: Diagnosis not present

## 2017-02-24 ENCOUNTER — Encounter: Payer: Self-pay | Admitting: Physical Therapy

## 2017-02-24 ENCOUNTER — Ambulatory Visit: Payer: Medicare Other | Attending: Orthopedic Surgery | Admitting: Physical Therapy

## 2017-02-24 DIAGNOSIS — M25612 Stiffness of left shoulder, not elsewhere classified: Secondary | ICD-10-CM | POA: Insufficient documentation

## 2017-02-24 DIAGNOSIS — M25512 Pain in left shoulder: Secondary | ICD-10-CM | POA: Diagnosis not present

## 2017-02-24 NOTE — Therapy (Signed)
[facility] [place], [place], [address] Phone: [phone]   Fax:  [phone]  Physical Therapy Treatment  Patient Details  Name: [name] MRN: [id number] Date of Birth: [birth year] Referring Provider: Victorino December, MD   Encounter Date: 02/24/2017      PT End of Session - 02/24/17 1543    Visit Number 7   Number of Visits 12   Date for PT Re-Evaluation 02/02/17   PT Start Time 0237   PT Stop Time 0332   PT Time Calculation (min) 55 min   Activity Tolerance Patient tolerated treatment well   Behavior During Therapy [facility] for tasks assessed/performed      Past Medical History:  Diagnosis Date  . Anemia   . Anxiety   . Blood transfusion   . Blood transfusion without reported diagnosis 1983   prbc  . Breast cancer ([place]) 03/01/12   left breast lumpectomy=invasive ductal ca,grade I/III,INVILVES SKELATAL MUSCLE   er=+,pr=NEG. HER2 NEG.  . Cancer ([place]) 08/09/11 DX    LEFT  . Depression   . Diabetes mellitus 2011   type II, on pills   . Diverticulosis   . Dyspnea    with exertion if walks a long ways per patient   . GERD (gastroesophageal reflux disease)   . Headache   . History of radiation therapy    RIGHT JAW 5 TXS FOR ABSESSED TOOTH IN HER 30'S   . History of radiation therapy 04/10/12-05/26/12   left breast  . Hyperlipidemia   . Hypertension   . Insomnia, unspecified   . Osteoporosis, unspecified    with prev fosamax treatment  . Other abnormal glucose   . PONV (postoperative nausea and vomiting)     Past Surgical History:  Procedure Laterality Date  . ABDOMINAL HYSTERECTOMY  1983   b/ls alpingooph orectomy 4 years later  . BILATERAL OOPHORECTOMY  1987  . BREAST LUMPECTOMY  03/01/12   LEFT=INVASIVE DUCTAL CA,gRADE i/iii,INVASION AT PSTERIOR RESECTION MARGIN AND INVLOVES SKE;ATAL MUSCLE,PERINEURAL INVASION IDENTIFIED  . INGUINAL HERNIA REPAIR Left 1999   LIH  . ORIF FEMUR FRACTURE Right    titanium rod  . SIGMOIDOSCOPY    . TUBAL LIGATION  1975    There were no vitals filed for this visit.      Subjective Assessment - 02/24/17 1531    Subjective I went to the doctor and he said I didn't need to come back and that I could continue PT if I want.  I scheduled a visit for next week.   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   Pain Score 2    Pain Location Shoulder   Pain Orientation Left   Pain Type Acute pain   Pain Onset More than a month ago   Pain Frequency Constant                         [facility] Adult PT Treatment/Exercise - 02/24/17 0001      Moist Heat Therapy   Number Minutes Moist Heat 20 Minutes   Moist Heat Location --  Left shoulder.     Acupuncturist Location Left shoulder.   Electrical Stimulation Action IFC x 20 minutes.   Electrical Stimulation Goals Pain     Manual Therapy   Manual Therapy Passive ROM   Passive ROM PROM to patient's left shoulder in supine x 25 minutes.  PT Short Term Goals - 01/05/17 1434      PT SHORT TERM GOAL #1   Title STG's=LTG's.           PT Long Term Goals - 01/10/17 1331      PT LONG TERM GOAL #1   Title Independent with a HEP.   Time 4   Period Weeks   Status On-going     PT LONG TERM GOAL #2   Title Active left shoulder flexion to 145 degrees so the patient can easily reach overhead.   Time 4   Period Weeks   Status On-going     PT LONG TERM GOAL #3   Title Active ER to 70 degrees+ to allow for easily donning/doffing of apparel.   Time 4   Period Weeks   Status On-going     PT LONG TERM GOAL #4   Title Increase ROM so patient is able to reach behind back to l4.   Time 4   Period Weeks   Status On-going     PT LONG TERM GOAL #5   Title Increase left shoulder strength to a solid 4/5 to increase stability for performance of functional activities.   Time 4   Period Weeks   Status  On-going               Plan - 02/24/17 1542    Clinical Impression Statement Good improvement with regards to left shoulder ER today.   Consulted and Agree with Plan of Care Patient      Patient will benefit from skilled therapeutic intervention in order to improve the following deficits and impairments:     Visit Diagnosis: Acute pain of left shoulder  Stiffness of left shoulder, not elsewhere classified     Problem List Patient Active Problem List   Diagnosis Date Noted  . Closed fracture of left proximal humerus 01/09/2017  . Left knee pain 09/30/2016  . Hypertension   . Colovaginal fistula s/p LAR rectosigmoid resection & colovaginal fistula repair/omentopexy 05/14/2016 05/14/2016  . Back spasm 08/05/2015  . Dysuria 08/05/2015  . Advance care planning 02/09/15  . Fall 2015-02-09  . Sudden death of family member 2014-02-15  . Medicare annual wellness visit, initial 10/08/2013  . BRBPR (bright red blood per rectum) 03/28/2013  . Chest wall pain 02/23/2012  . Breast cancer of upper-inner quadrant of left female breast ([place]) 08/12/2011  . Normal echocardiogram 12/07/2010  . Iron deficiency anemia 11/20/2010  . Femur fracture, right ([place]) 11/20/2010  . SHOULDER PAIN, LEFT 07/03/2010  . DM (diabetes mellitus) ([place]) 02-15-10  . HLD (hyperlipidemia) 01/27/2010  . GERD 01/27/2010  . Osteoporosis 01/27/2010  . Insomnia 01/27/2010  . Anxiety state 01/02/2010    Yanelle Sousa, [place] MPT 02/24/2017, 3:45 PM  [facility] [address] [place], [place], [address] Phone: [phone]   Fax:  [phone]  Name: [name] MRN: [id number] Date of Birth: 1941/04/12

## 2017-03-01 ENCOUNTER — Other Ambulatory Visit: Payer: Self-pay | Admitting: Family Medicine

## 2017-03-01 ENCOUNTER — Encounter: Payer: Self-pay | Admitting: Family Medicine

## 2017-03-01 MED ORDER — ONDANSETRON HCL 4 MG PO TABS: 4.0000 mg | ORAL_TABLET | Freq: Three times a day (TID) | ORAL | 0 refills | Status: DC | PRN

## 2017-03-02 ENCOUNTER — Encounter: Payer: Self-pay | Admitting: Physical Therapy

## 2017-03-02 ENCOUNTER — Ambulatory Visit: Payer: Medicare Other | Admitting: Physical Therapy

## 2017-03-02 DIAGNOSIS — M25512 Pain in left shoulder: Secondary | ICD-10-CM | POA: Diagnosis not present

## 2017-03-02 DIAGNOSIS — M25612 Stiffness of left shoulder, not elsewhere classified: Secondary | ICD-10-CM | POA: Diagnosis not present

## 2017-03-02 NOTE — Therapy (Addendum)
[facility] [place], [place], [address] Phone: [phone]   Fax:  [phone]  Physical Therapy Treatment  Patient Details  Name: [name] MRN: [id number] Date of Birth: 04/11/1941 Referring Provider: Victorino December, MD   Encounter Date: 03/02/2017      PT End of Session - 03/02/17 1352    Visit Number 8   Number of Visits 12   Date for PT Re-Evaluation 02/02/17   PT Start Time 0110   PT Stop Time 0200   PT Time Calculation (min) 50 min   Activity Tolerance Patient tolerated treatment well   Behavior During Therapy [facility] for tasks assessed/performed      Past Medical History:  Diagnosis Date  . Anemia   . Anxiety   . Blood transfusion   . Blood transfusion without reported diagnosis 1983   prbc  . Breast cancer ([place]) 03/01/12   left breast lumpectomy=invasive ductal ca,grade I/III,INVILVES SKELATAL MUSCLE   er=+,pr=NEG. HER2 NEG.  . Cancer ([place]) 08/09/11 DX    LEFT  . Depression   . Diabetes mellitus 2011   type II, on pills   . Diverticulosis   . Dyspnea    with exertion if walks a long ways per patient   . GERD (gastroesophageal reflux disease)   . Headache   . History of radiation therapy    RIGHT JAW 5 TXS FOR ABSESSED TOOTH IN HER 30'S   . History of radiation therapy 04/10/12-05/26/12   left breast  . Hyperlipidemia   . Hypertension   . Insomnia, unspecified   . Osteoporosis, unspecified    with prev fosamax treatment  . Other abnormal glucose   . PONV (postoperative nausea and vomiting)     Past Surgical History:  Procedure Laterality Date  . ABDOMINAL HYSTERECTOMY  1983   b/ls alpingooph orectomy 4 years later  . BILATERAL OOPHORECTOMY  1987  . BREAST LUMPECTOMY  03/01/12   LEFT=INVASIVE DUCTAL CA,gRADE i/iii,INVASION AT PSTERIOR RESECTION MARGIN AND INVLOVES SKE;ATAL MUSCLE,PERINEURAL INVASION IDENTIFIED  . INGUINAL HERNIA REPAIR Left 1999   LIH  . ORIF FEMUR FRACTURE Right    titanium rod  . SIGMOIDOSCOPY    . TUBAL LIGATION  1975    There were no vitals filed for this visit.      Subjective Assessment - 03/02/17 1354    Subjective My shoulder is better but I tried to put mail into a drop box and had to undo my seat beat and turn and use my right arm.   Pain Score 2    Pain Location Shoulder   Pain Orientation Left   Pain Descriptors / Indicators Aching   Pain Type Acute pain   Pain Onset More than a month ago                         [facility] Adult PT Treatment/Exercise - 03/02/17 0001      Modalities   Modalities Electrical Stimulation;Moist Heat     Moist Heat Therapy   Number Minutes Moist Heat 15 Minutes   Moist Heat Location --  Left shoulder.     Acupuncturist Location --  Left shoulder.   Electrical Stimulation Action IFC x 15 minutes.   Electrical Stimulation Goals Pain     Manual Therapy   Manual Therapy Passive ROM   Passive ROM PROM x 26 minutes to patient's left shoulder in supine into flexion, IR, ER and  also included anterior capsular stretching.                PT Education - 03/02/17 1413    Education provided Yes   Education Details Instructed patient in towel on wall exercise into flexion, CW and CWW to fatigue.   Person(s) Educated Patient   Methods Explanation;Demonstration   Comprehension Verbalized understanding          PT Short Term Goals - 01/05/17 1434      PT SHORT TERM GOAL #1   Title STG's=LTG's.           PT Long Term Goals - 01/10/17 1331      PT LONG TERM GOAL #1   Title Independent with a HEP.   Time 4   Period Weeks   Status On-going     PT LONG TERM GOAL #2   Title Active left shoulder flexion to 145 degrees so the patient can easily reach overhead.   Time 4   Period Weeks   Status On-going     PT LONG TERM GOAL #3   Title Active ER to 70 degrees+ to allow for easily donning/doffing of apparel.   Time 4   Period Weeks    Status On-going     PT LONG TERM GOAL #4   Title Increase ROM so patient is able to reach behind back to l4.   Time 4   Period Weeks   Status On-going     PT LONG TERM GOAL #5   Title Increase left shoulder strength to a solid 4/5 to increase stability for performance of functional activities.   Time 4   Period Weeks   Status On-going               Plan - 03/02/17 1415    Clinical Impression Statement Good improvement.  Progress to AAROM.   Consulted and Agree with Plan of Care Patient      Patient will benefit from skilled therapeutic intervention in order to improve the following deficits and impairments:  Decreased activity tolerance, Pain, Decreased range of motion  Visit Diagnosis: Acute pain of left shoulder  Stiffness of left shoulder, not elsewhere classified     Problem List Patient Active Problem List   Diagnosis Date Noted  . Closed fracture of left proximal humerus 01/09/2017  . Left knee pain 09/30/2016  . Hypertension   . Colovaginal fistula s/p LAR rectosigmoid resection & colovaginal fistula repair/omentopexy 05/14/2016 05/14/2016  . Back spasm 08/05/2015  . Dysuria 08/05/2015  . Advance care planning 01-24-2015  . Fall January 24, 2015  . Sudden death of family member 01-30-2014  . Medicare annual wellness visit, initial 10/08/2013  . BRBPR (bright red blood per rectum) 03/28/2013  . Chest wall pain 02/23/2012  . Breast cancer of upper-inner quadrant of left female breast ([place]) 08/12/2011  . Normal echocardiogram 12/07/2010  . Iron deficiency anemia 11/20/2010  . Femur fracture, right ([place]) 11/20/2010  . SHOULDER PAIN, LEFT 07/03/2010  . DM (diabetes mellitus) ([place]) January 30, 2010  . HLD (hyperlipidemia) 01/27/2010  . GERD 01/27/2010  . Osteoporosis 01/27/2010  . Insomnia 01/27/2010  . Anxiety state 01/02/2010    Anis Degidio, [place] MPT 03/02/2017, 2:16 PM  [facility] [address] [place],  [place], [address] Phone: [phone]   Fax:  [phone]  Name: [name] MRN: [id number] Date of Birth: [birth year]  PHYSICAL THERAPY DISCHARGE SUMMARY  Visits from Start of Care: 8.  Current functional level related to goals /  functional outcomes: See above.   Remaining deficits: See below.   Education / Equipment: HEP. Plan: Patient agrees to discharge.  Patient goals were not met. Patient is being discharged due to not returning since the last visit.  ?????         [place] Madellyn Denio MPT

## 2017-03-25 ENCOUNTER — Other Ambulatory Visit: Payer: Self-pay | Admitting: Family Medicine

## 2017-03-25 ENCOUNTER — Ambulatory Visit
Admission: RE | Admit: 2017-03-25 | Discharge: 2017-03-25 | Disposition: A | Discharge status: Home / Self Care | Payer: Medicare Other | Source: Ambulatory Visit | Attending: Family Medicine | Admitting: Family Medicine

## 2017-03-25 DIAGNOSIS — Z853 Personal history of malignant neoplasm of breast: Secondary | ICD-10-CM

## 2017-03-25 DIAGNOSIS — R928 Other abnormal and inconclusive findings on diagnostic imaging of breast: Secondary | ICD-10-CM | POA: Diagnosis not present

## 2017-03-25 DIAGNOSIS — N644 Mastodynia: Secondary | ICD-10-CM

## 2017-03-25 HISTORY — DX: Personal history of irradiation: Z92.3

## 2017-04-01 ENCOUNTER — Ambulatory Visit
Admission: RE | Admit: 2017-04-01 | Discharge: 2017-04-01 | Disposition: A | Discharge status: Home / Self Care | Payer: Medicare Other | Source: Ambulatory Visit | Attending: Family Medicine | Admitting: Family Medicine

## 2017-04-01 DIAGNOSIS — Z853 Personal history of malignant neoplasm of breast: Secondary | ICD-10-CM

## 2017-04-01 DIAGNOSIS — N6489 Other specified disorders of breast: Secondary | ICD-10-CM | POA: Diagnosis not present

## 2017-04-04 ENCOUNTER — Encounter: Payer: Self-pay | Admitting: *Deleted

## 2017-04-11 ENCOUNTER — Other Ambulatory Visit: Payer: Self-pay | Admitting: Family Medicine

## 2017-04-11 NOTE — Telephone Encounter (Signed)
Refill request does not match medication list Last office visit 01/07/17

## 2017-04-12 MED ORDER — SERTRALINE HCL 100 MG PO TABS: ORAL_TABLET | ORAL | 1 refills | Status: DC

## 2017-04-12 NOTE — Telephone Encounter (Signed)
Verify with patient. I thought she was taking 1 tablet a day, 100mg  sertraline. Thanks.

## 2017-04-12 NOTE — Telephone Encounter (Signed)
Patient states she is taking 1 tablet a day.  She was once told to do 1 and 1/2 but has only taken 1 a day.  Rx changed and sent.

## 2017-04-15 ENCOUNTER — Telehealth: Payer: Self-pay | Admitting: *Deleted

## 2017-04-15 NOTE — Telephone Encounter (Signed)
Faxed refill request. Lorazepam Last office visit:   01/07/17 Last Filled:   Not on current meds list. Please advise.

## 2017-04-17 MED ORDER — LORAZEPAM 2 MG PO TABS: ORAL_TABLET | ORAL | 2 refills | Status: DC

## 2017-04-17 NOTE — Telephone Encounter (Signed)
Has been on in the past. Please call in.  Thanks.  Reasonable for OV with labs ahead of time if possible, re: DM2.  Thanks.

## 2017-04-18 ENCOUNTER — Other Ambulatory Visit: Payer: Self-pay | Admitting: Family Medicine

## 2017-04-18 ENCOUNTER — Telehealth: Payer: Self-pay | Admitting: Family Medicine

## 2017-04-18 NOTE — Telephone Encounter (Signed)
Medication phoned to pharmacy.  

## 2017-04-18 NOTE — Telephone Encounter (Signed)
Copied from [place] [id number]. Topic: Inquiry >> Apr 18, 2017  2:11 PM Darl Householder, RMA wrote: Reason for CRM: patient called stating medication Lorazapam was sent to wrong pharmacy, please resend medication to [facility] in [place], please call pt once this is complete

## 2017-04-18 NOTE — Telephone Encounter (Signed)
Patient advised that she has 4 pharmacies in her chart and that she needs to be specific as to which pharmacy it needs to be sent.  Patient will pick up this Rx from [facility] in [place].

## 2017-04-28 ENCOUNTER — Other Ambulatory Visit: Payer: Self-pay | Admitting: Family Medicine

## 2017-05-27 ENCOUNTER — Other Ambulatory Visit: Payer: Self-pay | Admitting: Family Medicine

## 2017-06-22 ENCOUNTER — Telehealth: Payer: Self-pay

## 2017-06-22 DIAGNOSIS — M810 Age-related osteoporosis without current pathological fracture: Secondary | ICD-10-CM

## 2017-06-22 DIAGNOSIS — E119 Type 2 diabetes mellitus without complications: Secondary | ICD-10-CM

## 2017-06-22 NOTE — Telephone Encounter (Signed)
Pt last seen and A1c done on 01/07/17.

## 2017-06-22 NOTE — Telephone Encounter (Signed)
Copied from [place] [id number]. Topic: Appointment Scheduling - Scheduling Inquiry for Clinic >> Jun 22, 2017  1:24 PM Synthia Innocent wrote: Reason for CRM: patient would like to have A1C checked at the [facility], please advise

## 2017-06-22 NOTE — Telephone Encounter (Signed)
Due for multiple labs, I put in the orders.  Please get done fasting.  Thanks.  This should cover her labs prior to the visit with me/Pinson in 09/2017.

## 2017-06-22 NOTE — Telephone Encounter (Signed)
Patient notified as instructed by telephone and verbalized understanding.  Follow-up lab appointment scheduled. 

## 2017-07-01 ENCOUNTER — Other Ambulatory Visit: Payer: Medicare Other

## 2017-07-07 ENCOUNTER — Other Ambulatory Visit: Payer: Self-pay | Admitting: Family Medicine

## 2017-07-14 ENCOUNTER — Telehealth: Payer: Self-pay | Admitting: Family Medicine

## 2017-07-14 ENCOUNTER — Other Ambulatory Visit (INDEPENDENT_AMBULATORY_CARE_PROVIDER_SITE_OTHER): Payer: Medicare Other

## 2017-07-14 DIAGNOSIS — M810 Age-related osteoporosis without current pathological fracture: Secondary | ICD-10-CM

## 2017-07-14 DIAGNOSIS — E119 Type 2 diabetes mellitus without complications: Secondary | ICD-10-CM

## 2017-07-14 LAB — CBC WITH DIFFERENTIAL/PLATELET
Basophils Absolute: 0.1 10*3/uL (ref 0.0–0.1)
Basophils Relative: 1.3 % (ref 0.0–3.0)
EOS PCT: 1.8 % (ref 0.0–5.0)
Eosinophils Absolute: 0.1 10*3/uL (ref 0.0–0.7)
HCT: 40.4 % (ref 36.0–46.0)
Hemoglobin: 13.8 g/dL (ref 12.0–15.0)
LYMPHS ABS: 1.5 10*3/uL (ref 0.7–4.0)
Lymphocytes Relative: 26.6 % (ref 12.0–46.0)
MCHC: 34.2 g/dL (ref 30.0–36.0)
MCV: 89.2 fl (ref 78.0–100.0)
MONO ABS: 0.3 10*3/uL (ref 0.1–1.0)
Monocytes Relative: 5.3 % (ref 3.0–12.0)
NEUTROS ABS: 3.6 10*3/uL (ref 1.4–7.7)
NEUTROS PCT: 65 % (ref 43.0–77.0)
PLATELETS: 223 10*3/uL (ref 150.0–400.0)
RBC: 4.53 Mil/uL (ref 3.87–5.11)
RDW: 13.4 % (ref 11.5–15.5)
WBC: 5.6 10*3/uL (ref 4.0–10.5)

## 2017-07-14 LAB — COMPREHENSIVE METABOLIC PANEL
ALK PHOS: 58 U/L (ref 39–117)
ALT: 14 U/L (ref 0–35)
AST: 16 U/L (ref 0–37)
Albumin: 4.2 g/dL (ref 3.5–5.2)
BUN: 13 mg/dL (ref 6–23)
CHLORIDE: 101 meq/L (ref 96–112)
CO2: 30 meq/L (ref 19–32)
Calcium: 9.9 mg/dL (ref 8.4–10.5)
Creatinine, Ser: 0.56 mg/dL (ref 0.40–1.20)
GFR: 111.16 mL/min (ref >60.00)
GLUCOSE: 170 mg/dL — AB (ref 70–99)
POTASSIUM: 3.6 meq/L (ref 3.5–5.1)
SODIUM: 137 meq/L (ref 135–145)
Total Bilirubin: 0.5 mg/dL (ref 0.2–1.2)
Total Protein: 7.7 g/dL (ref 6.0–8.3)

## 2017-07-14 LAB — VITAMIN D 25 HYDROXY (VIT D DEFICIENCY, FRACTURES): VITD: 37.72 ng/mL (ref 30.00–100.00)

## 2017-07-14 LAB — LIPID PANEL
CHOL/HDL RATIO: 6
Cholesterol: 183 mg/dL (ref 0–200)
HDL: 30.5 mg/dL — AB (ref >39.00)
LDL CALC: 122 mg/dL — AB (ref 0–99)
NONHDL: 152.37
Triglycerides: 152 mg/dL — ABNORMAL HIGH (ref 0.0–149.0)
VLDL: 30.4 mg/dL (ref 0.0–40.0)

## 2017-07-14 LAB — MICROALBUMIN / CREATININE URINE RATIO
Creatinine,U: 152.2 mg/dL
MICROALB UR: 2.1 mg/dL — AB (ref 0.0–1.9)
Microalb Creat Ratio: 1.4 mg/g (ref 0.0–30.0)

## 2017-07-14 LAB — HEMOGLOBIN A1C: HEMOGLOBIN A1C: 8.1 % — AB (ref 4.6–6.5)

## 2017-07-14 NOTE — Telephone Encounter (Signed)
Placed in Dr. Duncan's In Box. 

## 2017-07-14 NOTE — Telephone Encounter (Signed)
Form done. Thanks. 

## 2017-07-14 NOTE — Telephone Encounter (Signed)
Pt dropped off disability parking placard to be filled out In dr Dam[facility] up front  Best number [phone] Pt would like this mailed back to her

## 2017-07-15 ENCOUNTER — Encounter: Payer: Self-pay | Admitting: *Deleted

## 2017-07-15 NOTE — Telephone Encounter (Signed)
Left detailed message on voicemail that form was ready for pickup.  Left at front desk.

## 2017-07-15 NOTE — Telephone Encounter (Signed)
I advised pt have mailed handicap placard as requested to verified home address. Pt voiced understanding.

## 2017-07-15 NOTE — Telephone Encounter (Signed)
Pt called in to request to have form mailed to her at the address on file instead of pick up.

## 2017-07-17 ENCOUNTER — Other Ambulatory Visit: Payer: Self-pay | Admitting: Family Medicine

## 2017-07-18 NOTE — Telephone Encounter (Signed)
Electronic refill request. Lorazepam Last office visit:   01/07/2017 Last Filled:    150 tablet 2 04/17/2017  Please advise.

## 2017-07-18 NOTE — Telephone Encounter (Signed)
Patient is calling in regards to this. Please advise

## 2017-07-19 NOTE — Telephone Encounter (Signed)
Sent. Thanks.   

## 2017-07-29 NOTE — Progress Notes (Signed)
This encounter was created in error - please disregard.

## 2017-09-29 ENCOUNTER — Ambulatory Visit: Payer: Medicare Other

## 2017-09-29 ENCOUNTER — Encounter: Payer: Medicare Other | Admitting: Family Medicine

## 2017-10-01 ENCOUNTER — Other Ambulatory Visit: Payer: Self-pay | Admitting: Family Medicine

## 2017-10-06 ENCOUNTER — Ambulatory Visit (INDEPENDENT_AMBULATORY_CARE_PROVIDER_SITE_OTHER): Payer: Medicare Other

## 2017-10-06 ENCOUNTER — Ambulatory Visit: Payer: Medicare Other | Admitting: Family Medicine

## 2017-10-06 ENCOUNTER — Encounter: Payer: Self-pay | Admitting: Family Medicine

## 2017-10-06 ENCOUNTER — Ambulatory Visit (INDEPENDENT_AMBULATORY_CARE_PROVIDER_SITE_OTHER): Payer: Medicare Other | Admitting: Family Medicine

## 2017-10-06 VITALS — BP 128/68 | HR 52 | Temp 98.4°F | Ht 60.5 in | Wt 134.8 lb

## 2017-10-06 DIAGNOSIS — Z170 Estrogen receptor positive status [ER+]: Secondary | ICD-10-CM | POA: Diagnosis not present

## 2017-10-06 DIAGNOSIS — D509 Iron deficiency anemia, unspecified: Secondary | ICD-10-CM

## 2017-10-06 DIAGNOSIS — M810 Age-related osteoporosis without current pathological fracture: Secondary | ICD-10-CM

## 2017-10-06 DIAGNOSIS — E119 Type 2 diabetes mellitus without complications: Secondary | ICD-10-CM | POA: Diagnosis not present

## 2017-10-06 DIAGNOSIS — I10 Essential (primary) hypertension: Secondary | ICD-10-CM

## 2017-10-06 DIAGNOSIS — Z0000 Encounter for general adult medical examination without abnormal findings: Secondary | ICD-10-CM

## 2017-10-06 DIAGNOSIS — C50212 Malignant neoplasm of upper-inner quadrant of left female breast: Secondary | ICD-10-CM

## 2017-10-06 DIAGNOSIS — H6980 Other specified disorders of Eustachian tube, unspecified ear: Secondary | ICD-10-CM

## 2017-10-06 DIAGNOSIS — F411 Generalized anxiety disorder: Secondary | ICD-10-CM | POA: Diagnosis not present

## 2017-10-06 DIAGNOSIS — Z7189 Other specified counseling: Secondary | ICD-10-CM

## 2017-10-06 DIAGNOSIS — E785 Hyperlipidemia, unspecified: Secondary | ICD-10-CM | POA: Diagnosis not present

## 2017-10-06 DIAGNOSIS — H6990 Unspecified Eustachian tube disorder, unspecified ear: Secondary | ICD-10-CM

## 2017-10-06 LAB — MICROALBUMIN / CREATININE URINE RATIO
CREATININE, U: 104.7 mg/dL
MICROALB UR: 0.8 mg/dL (ref 0.0–1.9)
MICROALB/CREAT RATIO: 0.8 mg/g (ref 0.0–30.0)

## 2017-10-06 LAB — BASIC METABOLIC PANEL
BUN: 11 mg/dL (ref 6–23)
CHLORIDE: 101 meq/L (ref 96–112)
CO2: 29 mEq/L (ref 19–32)
CREATININE: 0.58 mg/dL (ref 0.40–1.20)
Calcium: 9.5 mg/dL (ref 8.4–10.5)
GFR: 106.68 mL/min (ref >60.00)
Glucose, Bld: 184 mg/dL — ABNORMAL HIGH (ref 70–99)
POTASSIUM: 4.2 meq/L (ref 3.5–5.1)
SODIUM: 136 meq/L (ref 135–145)

## 2017-10-06 LAB — HEMOGLOBIN A1C: Hgb A1c MFr Bld: 7.9 % — ABNORMAL HIGH (ref 4.6–6.5)

## 2017-10-06 MED ORDER — FLUTICASONE PROPIONATE 50 MCG/ACT NA SUSP: 2.0000 | Freq: Every day | NASAL | Status: DC

## 2017-10-06 MED ORDER — LORAZEPAM 2 MG PO TABS: ORAL_TABLET | ORAL | 2 refills | Status: DC

## 2017-10-06 NOTE — Progress Notes (Signed)
H/o osteoporosis.  Her L arm ROM is better after last year.  She has prev R femur fx with repair.  She has some L shoulder pain intermittently, using roll on liniment with some relief.   We talked about options given her hx. given her history of atypical long bone fracture it likely makes sense not to start another medication for osteoporosis. She agrees.   Hypertension:    Using medication without problems or lightheadedness: yes Chest pain with exertion:no Edema:no Short of breath:no  Diabetes:  Using medications without difficulties:yes, taking metformin 1 tab BID.   Hypoglycemic episodes: no Hyperglycemic episodes:no Feet problems:no Blood Sugars averaging:  100-200, none <100 or >200 eye exam within last year:due, d/w pt.   Prev labs d/w pt.   MALB minimally positive.  F/u testing pending.  See notes on labs.  D/w pt about rationale for testing.   Statin intolerant.  D/w pt.    She has some occ sputum production and L ear pain.  No fevers.    Mood d/w pt.  She is living with her daughter and she is getting by with that.  She is still dealing with the death of her grandson who previously committed suicide.  She is still on SSRI with wellbutrin and prn BZD and she thought the combination was helpful.  "My nerves would probably be cracked without it."  D/w pt.  No Si/Hi.  Sleep is "good" per patient report.   She has hope about her situation.    Anemia resolved on prev CBC.  dw pt.    Advance directive - daughter Otis [place] designated if patient were incapacitated.   Meds, vitals, and allergies reviewed.   PMH and SH reviewed  ROS: Per HPI unless specifically indicated in ROS section   GEN: nad, alert and oriented HEENT: mucous membranes moist NECK: supple w/o LA CV: rrr. PULM: ctab, no inc wob ABD: soft, +bs EXT: no edema SKIN: no acute rash L ETD noted on exam, minimal cerumen on exam. No TM erythema.    Diabetic foot exam: Normal inspection No skin breakdown No calluses   Normal DP pulses Normal sensation to light touch and monofilament Nails normal

## 2017-10-06 NOTE — Patient Instructions (Addendum)
Go to the lab on the way out.  We'll contact you with your lab report. See Pinson before you leave.  Call about an eye exam when possible.  Plan on recheck in about 6 months, sooner if needed.  You don't need a lab appointment ahead of time and you don't need to fast.   I would get a flu shot each fall.   Check with your insurance to see if they will cover the tetanus shot. Use flonase and see if that helps with the ear symptoms.   Take care.  Glad to see you.

## 2017-10-06 NOTE — Progress Notes (Signed)
Subjective:   [name] is a 77 y.o. female who presents for Medicare Annual (Subsequent) preventive examination.  Review of Systems:  N/A Cardiac Risk Factors include: advanced age (>46mn, >>30women);diabetes mellitus;hypertension     Objective:     Vitals: BP 128/68   Pulse (!) 52   Temp 98.4 F (36.9 C) (Oral)   Ht 5' 0.5" (1.537 m)   Wt 134 lb 12 oz (61.1 kg)   SpO2 96%   BMI 25.88 kg/m   Body mass index is 25.88 kg/m.  Advanced Directives 10/06/2017 01/05/2017 12/06/2016 09/28/2016 05/14/2016 05/12/2016 02/20/2015  Does Patient Have a Medical Advance Directive? Yes Yes Yes Yes Yes Yes No  Type of AParamed[place]of [place] will - Living will [place] Healthcare Power of [place] VillageLiving will -  Does patient want to make changes to medical advance directive? - - - - No - Patient declined - -  Copy of [place] Chart? No - copy requested - - No - copy requested No - copy requested No - copy requested -    Tobacco Social History   Tobacco Use  Smoking Status Never Smoker  Smokeless Tobacco Never Used     Counseling given: No   Clinical Intake:  Pre-visit preparation completed: Yes  Pain : No/denies pain Pain Score: 0-No pain     Nutritional Status: BMI 25 -29 Overweight Nutritional Risks: None Diabetes: Yes CBG done?: No Did pt. bring in CBG monitor from home?: No  How often do you need to have someone help you when you read instructions, pamphlets, or other written materials from your doctor or pharmacy?: 1 - Never What is the last grade level you completed in school?: GED  Interpreter Needed?: No  Comments: pt lives with daughter Information entered by :: LPinson, LPN  Past Medical History:  Diagnosis Date  . Anemia   . Anxiety   . Blood transfusion   . Blood transfusion without reported diagnosis 1983   prbc  . Breast cancer ([facility]  03/01/12   left breast lumpectomy=invasive ductal ca,grade I/III,INVILVES SKELATAL MUSCLE   er=+,pr=NEG. HER2 NEG.  . [place] [place] DX    LEFT  . Depression   . Diabetes mellitus 2011   type II, on pills   . Diverticulosis   . Dyspnea    with exertion if walks a long ways per patient   . GERD (gastroesophageal reflux disease)   . Headache   . History of radiation therapy    RIGHT JAW 5 TXS FOR ABSESSED TOOTH IN HER 30'S   . History of radiation therapy 04/10/12-05/26/12   left breast  . Hyperlipidemia   . Hypertension   . Insomnia, unspecified   . Osteoporosis, unspecified    with prev fosamax treatment  . Other abnormal glucose   . Personal history of radiation therapy   . PONV (postoperative nausea and vomiting)    Past Surgical History:  Procedure Laterality Date  . ABDOMINAL HYSTERECTOMY  1983   b/ls alpingooph orectomy 4 years later  . BILATERAL OOPHORECTOMY  1987  . BREAST LUMPECTOMY Left 03/01/12   LEFT=INVASIVE DUCTAL CA,gRADE i/iii,INVASION AT PSTERIOR RESECTION MARGIN AND INVLOVES SKE;ATAL MUSCLE,PERINEURAL INVASION IDENTIFIED  . INGUINAL HERNIA REPAIR Left 1999   LIH  . ORIF FEMUR FRACTURE Right    titanium rod  . SIGMOIDOSCOPY    . TUBAL LIGATION  1975   Family History  Problem Relation  Age of Onset  . Diabetes Mother   . Alcohol abuse Father   . Diabetes Sister   . Parkinson's disease Sister   . Bladder Cancer Brother   . Diabetes Sister   . Bladder Cancer Son   . Colon cancer Neg Hx   . Esophageal cancer Neg Hx   . Pancreatic cancer Neg Hx   . Rectal cancer Neg Hx   . Stomach cancer Neg Hx   . Breast cancer Neg Hx    Social History   Socioeconomic History  . Marital status: Widowed    Spouse name: Not on file  . Number of children: 2  . Years of education: Not on file  . Highest education level: Not on file  Occupational History  . 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 Financial resource strain: Not on file  .  Food insecurity:    Worry: Not on file    Inability: Not on file  . Transportation needs:    Medical: Not on file    Non-medical: Not on file  Tobacco Use  . Smoking status: Never Smoker  . Smokeless tobacco: Never Used  Substance and Sexual Activity  . Alcohol use: No    Alcohol/week: 0.0 oz  . Drug use: No  . Sexual activity: Not Currently    Birth control/protection: Post-menopausal    Comment: gxp2, pregnancy 1st child age 12, 2 step children, no hrt  Lifestyle  . Physical activity:    Days per week: Not on file    Minutes per session: Not on file  . Stress: Not on file  Relationships  . Social connections:    Talks on phone: Not on file    Gets together: Not on file    Attends religious service: Not on file    Active member of club or organization: Not on file    Attends meetings of clubs or organizations: Not on file    Relationship status: Not on file  Other Topics Concern  . Not on file  Social History Narrative   Widowed 2014.    Living with her daughter.      Outpatient Encounter Medications as of 10/06/2017  Medication Sig  . ACCU-CHEK SOFTCLIX LANCETS lancets USE ONCE DAILY AS DIRECTED.  E11.9  . amLODipine (NORVASC) 5 MG tablet TAKE ONE TABLET BY MOUTH ONCE DAILY  . Blood Glucose Monitoring Suppl (ACCU-CHEK AVIVA PLUS) w/Device KIT Use as instructed to test blood sugar once daily or as directed.  Dx:  E11.9  Non-insulin dependent.  Marland Kitchen buPROPion (WELLBUTRIN SR) 100 MG 12 hr tablet TAKE 1 TABLET BY MOUTH ONCE DAILY  . cholecalciferol (VITAMIN D) 1000 UNITS tablet Take 1,000 Units by mouth daily.  . fluticasone (FLONASE) 50 MCG/ACT nasal spray Place 2 sprays into both nostrils daily.  Marland Kitchen glucose blood (ACCU-CHEK AVIVA PLUS) test strip USE ONE STRIP TO CHECK GLUCOSE ONCE DAILY.  E11.9  . LORazepam (ATIVAN) 2 MG tablet TAKE 1 TABLET BY MOUTH 3 TIMES DAILY AS NEEDED AND 1 OR 2 TABLETS AT BEDTIME  . metFORMIN (GLUCOPHAGE) 500 MG tablet TAKE 1 TO 2 TABLETS BY MOUTH  TWICE DAILY WITH MEALS  . metoprolol tartrate (LOPRESSOR) 50 MG tablet TAKE 1 TABLET BY MOUTH TWICE DAILY  . Omega-3 Fatty Acids (FISH OIL) 1000 MG CAPS Take 1,000 mg by mouth 2 (two) times daily.   Marland Kitchen omeprazole (PRILOSEC) 20 MG capsule TAKE ONE CAPSULE BY MOUTH TWICE DAILY AS NEEDED  .  sertraline (ZOLOFT) 100 MG tablet TAKE 1 TABLET BY MOUTH IN THE MORNING   No facility-administered encounter medications on file as of 10/06/2017.     Activities of Daily Living In your present state of health, do you have any difficulty performing the following activities: 10/06/2017  Hearing? N  Vision? N  Difficulty concentrating or making decisions? N  Walking or climbing stairs? Y  Comment SOB when walking long distances  Dressing or bathing? N  Doing errands, shopping? N  Preparing Food and eating ? N  Using the Toilet? N  In the past six months, have you accidently leaked urine? Y  Do you have problems with loss of bowel control? N  Managing your Medications? N  Managing your Finances? N  Housekeeping or managing your Housekeeping? N  Some recent data might be hidden    Patient Care Team: Tonia Ghent, MD as PCP - Huston Foley, MD as Consulting Physician (General Surgery) Alden Hipp, MD as Consulting Physician (Obstetrics and Gynecology) Irene Shipper, MD as Consulting Physician (Gastroenterology)    Assessment:   This is a routine wellness examination for [place].   Hearing Screening   125Hz  250Hz  500Hz  1000Hz  2000Hz  3000Hz  4000Hz  6000Hz  8000Hz   Right ear:   40 40 40  40    Left ear:   40 40 40  40      Visual Acuity Screening   Right eye Left eye Both eyes  Without correction:     With correction: 20/50 20/50 20/50     Exercise Activities and Dietary recommendations Current Exercise Habits: The patient does not participate in regular exercise at present, Exercise limited by: None identified  Goals    . DIET - INCREASE WATER INTAKE     Starting 10/06/2017, I  will attempt to drink at least 4-6 glasses of water daily.        Fall Risk Fall Risk  10/06/2017 10/06/2017 09/28/2016 01/21/2015 02/14/2014  Falls in the past year? Yes Yes No Yes No  Comment June 2018; broke arm after climbing up on barstool. stool turned over and she fell on top of arm - - - -  Number falls in past yr: 1 1 - 2 or more -  Injury with Fall? Yes Yes - Yes -   Depression Screen PHQ 2/9 Scores 10/06/2017 10/06/2017 09/28/2016 01/21/2015  PHQ - 2 Score 0 0 2 6  PHQ- 9 Score 0 - 3 -     Cognitive Function MMSE - Mini Mental State Exam 10/06/2017 09/28/2016  Orientation to time 5 5  Orientation to Place 5 5  Registration 3 3  Attention/ Calculation 0 0  Recall 3 3  Language- name 2 objects 0 0  Language- repeat 1 1  Language- follow 3 step command 3 3  Language- read & follow direction 0 0  Write a sentence 0 0  Copy design 0 0  Total score 20 20     PLEASE NOTE: A Mini-Cog screen was completed. Maximum score is 20. A value of 0 denotes this part of Folstein MMSE was not completed or the patient failed this part of the Mini-Cog screening.   Mini-Cog Screening Orientation to Time - Max 5 pts Orientation to Place - Max 5 pts Registration - Max 3 pts Recall - Max 3 pts Language Repeat - Max 1 pts Language Follow 3 Step Command - Max 3 pts     Immunization History  Administered Date(s) Administered  . Influenza Split 03/30/2011  .  Influenza Whole 04/04/2009, 05/19/2010  . Influenza, Seasonal, Injecte, Preservative Fre 05/24/2012  . Influenza,inj,Quad PF,6+ Mos 03/28/2013, 02/14/2014, 08/04/2015  . Influenza-Unspecified 05/08/2016  . Pneumococcal Conjugate-13 01/21/2015  . Pneumococcal Polysaccharide-23 06/15/2003, 05/28/2016  . Td 07/13/2007    Screening Tests Health Maintenance  Topic Date Due  . OPHTHALMOLOGY EXAM  07/14/2018 (Originally 07/05/2017)  . TETANUS/TDAP  10/07/2018 (Originally 07/12/2017)  . HEMOGLOBIN A1C  01/11/2018  . INFLUENZA VACCINE   01/12/2018  . URINE MICROALBUMIN  07/14/2018  . FOOT EXAM  10/07/2018  . DEXA SCAN  Completed  . PNA vac Low Risk Adult  Completed       Plan:     I have personally reviewed, addressed, and noted the following in the patient's chart:  A. Medical and social history B. Use of alcohol, tobacco or illicit drugs  C. Current medications and supplements D. Functional ability and status E.  Nutritional status F.  Physical activity G. Advance directives H. List of other physicians I.  Hospitalizations, surgeries, and ER visits in previous 12 months J.  [place] to include hearing, vision, cognitive, depression L. Referrals and appointments - none  In addition, I have reviewed and discussed with patient certain preventive protocols, quality metrics, and best practice recommendations. A written personalized care plan for preventive services as well as general preventive health recommendations were provided to patient.  See attached scanned questionnaire for additional information.   Signed,   Lindell Noe, MHA, BS, LPN Health Coach

## 2017-10-06 NOTE — Patient Instructions (Signed)
Ms. Chouinard , Thank you for taking time to come for your Medicare Wellness Visit. I appreciate your ongoing commitment to your health goals. Please review the following plan we discussed and let me know if I can assist you in the future.   These are the goals we discussed: Goals    . DIET - INCREASE WATER INTAKE     Starting 10/06/2017, I will attempt to drink at least 4-6 glasses of water daily.        This is a list of the screening recommended for you and due dates:  Health Maintenance  Topic Date Due  . Eye exam for diabetics  07/14/2018*  . Tetanus Vaccine  10/07/2018*  . Hemoglobin A1C  01/11/2018  . Flu Shot  01/12/2018  . Urine Protein Check  07/14/2018  . Complete foot exam   10/07/2018  . DEXA scan (bone density measurement)  Completed  . Pneumonia vaccines  Completed  *Topic was postponed. The date shown is not the original due date.   Preventive Care for Adults  A healthy lifestyle and preventive care can promote health and wellness. Preventive health guidelines for adults include the following key practices.  . A routine yearly physical is a good way to check with your health care provider about your health and preventive screening. It is a chance to share any concerns and updates on your health and to receive a thorough exam.  . Visit your dentist for a routine exam and preventive care every 6 months. Brush your teeth twice a day and floss once a day. Good oral hygiene prevents tooth decay and gum disease.  . The frequency of eye exams is based on your age, health, family medical history, use  of contact lenses, and other factors. Follow your health care provider's recommendations for frequency of eye exams.  . Eat a healthy diet. Foods like vegetables, fruits, whole grains, low-fat dairy products, and lean protein foods contain the nutrients you need without too many calories. Decrease your intake of foods high in solid fats, added sugars, and salt. Eat the right  amount of calories for you. Get information about a proper diet from your health care provider, if necessary.  . Regular physical exercise is one of the most important things you can do for your health. Most adults should get at least 150 minutes of moderate-intensity exercise (any activity that increases your heart rate and causes you to sweat) each week. In addition, most adults need muscle-strengthening exercises on 2 or more days a week.  Silver Sneakers may be a benefit available to you. To determine eligibility, you may visit the website: www.silversneakers.com or contact program at [phone] Mon-Fri between 8AM-8PM.   . Maintain a healthy weight. The body mass index (BMI) is a screening tool to identify possible weight problems. It provides an estimate of body fat based on height and weight. Your health care provider can find your BMI and can help you achieve or maintain a healthy weight.   For adults 20 years and older: ? A BMI below 18.5 is considered underweight. ? A BMI of 18.5 to 24.9 is normal. ? A BMI of 25 to 29.9 is considered overweight. ? A BMI of 30 and above is considered obese.   . Maintain normal blood lipids and cholesterol levels by exercising and minimizing your intake of saturated fat. Eat a balanced diet with plenty of fruit and vegetables. Blood tests for lipids and cholesterol should begin at age 53 and be  repeated every 5 years. If your lipid or cholesterol levels are high, you are over 50, or you are at high risk for heart disease, you may need your cholesterol levels checked more frequently. Ongoing high lipid and cholesterol levels should be treated with medicines if diet and exercise are not working.  . If you smoke, find out from your health care provider how to quit. If you do not use tobacco, please do not start.  . If you choose to drink alcohol, please do not consume more than 2 drinks per day. One drink is considered to be 12 ounces (355 mL) of beer, 5  ounces (148 mL) of wine, or 1.5 ounces (44 mL) of liquor.  . If you are 41-61 years old, ask your health care provider if you should take aspirin to prevent strokes.  . Use sunscreen. Apply sunscreen liberally and repeatedly throughout the day. You should seek shade when your shadow is shorter than you. Protect yourself by wearing long sleeves, pants, a wide-brimmed hat, and sunglasses year round, whenever you are outdoors.  . Once a month, do a whole body skin exam, using a mirror to look at the skin on your back. Tell your health care provider of new moles, moles that have irregular borders, moles that are larger than a pencil eraser, or moles that have changed in shape or color.

## 2017-10-06 NOTE — Progress Notes (Signed)
PCP notes:   Health maintenance:  Eye exam - addressed Tetanus vaccine - postponed/insurance Foot exam - completed today by PCP  Abnormal screenings:   Fall risk - hx of single fall Fall Risk  10/06/2017 10/06/2017 09/28/2016 01/21/2015 02/14/2014  Falls in the past year? Yes Yes No Yes No  Comment June 2018; broke arm after climbing up on barstool. stool turned over and she fell on top of arm - - - -  Number falls in past yr: 1 1 - 2 or more -  Injury with Fall? Yes Yes - Yes -   Patient concerns:   Patient is concerned about the accuracy of Aviva Plus Accucheck glucometer. Patient does not have control solutions. Per PCP, fingerstick performed with glucometer. Blood glucose reading was 177. PCP plans to compare reading with serum blood glucose level on CMP.   Nurse concerns:  None  Next PCP appt:   N/A; CPE prior to AWV

## 2017-10-07 DIAGNOSIS — H6980 Other specified disorders of Eustachian tube, unspecified ear: Secondary | ICD-10-CM | POA: Insufficient documentation

## 2017-10-07 NOTE — Assessment & Plan Note (Signed)
Mammogram up-to-date, previously released by oncology clinic.

## 2017-10-07 NOTE — Assessment & Plan Note (Signed)
We talked about options given her hx. given her history of atypical long bone fracture it likely makes sense not to start another medication for osteoporosis. She agrees.

## 2017-10-07 NOTE — Assessment & Plan Note (Addendum)
Prev labs d/w pt.   MALB minimally positive.  F/u testing pending.  See notes on labs.  D/w pt about rationale for testing.   No change in meds at this point.  We can calibrate her home glucose meter with the labs drawn today. >25 minutes spent in face to face time with patient, >50% spent in counselling or coordination of care, discussing diabetes, previous labs, mood, etc.

## 2017-10-07 NOTE — Assessment & Plan Note (Signed)
History of, resolved on previous CBC.  Discussed with patient.

## 2017-10-07 NOTE — Assessment & Plan Note (Signed)
Advance directive - daughter [name] designated if patient were incapacitated. 

## 2017-10-07 NOTE — Assessment & Plan Note (Signed)
Eustachian tube dysfunction noted on exam.  No tympanic membrane movement with Valsalva.  This likely contributes to some of her symptoms.  Discussed with patient about using Flonase.  She likely has some postnasal drip is contributing to her cough.  Update me as needed.  She agrees.

## 2017-10-07 NOTE — Assessment & Plan Note (Signed)
She is still on SSRI with wellbutrin and prn BZD and she thought the combination was helpful.  "My nerves would probably be cracked without it."  D/w pt.  No Si/Hi.  Sleep is "good" per patient report.   She has hope about her situation.   Likely reasonable to continue meds as is.  She agrees.

## 2017-10-07 NOTE — Assessment & Plan Note (Signed)
Statin intolerant.  D/w pt.

## 2017-10-07 NOTE — Assessment & Plan Note (Signed)
Reasonable control.  No change in meds.  See notes on follow-up labs.

## 2017-10-09 NOTE — Progress Notes (Signed)
I reviewed health advisor's note, was available for consultation on the day of service listed in this note, and agree with documentation and plan. Kamauri Denardo, MD.   

## 2017-10-17 ENCOUNTER — Telehealth: Payer: Self-pay | Admitting: Family Medicine

## 2017-10-17 NOTE — Telephone Encounter (Signed)
Copied from [place] [id number]. Topic: Quick Communication - Rx Refill/Question >> Oct 17, 2017  3:40 PM Boyd Kerbs wrote: Medication:  LORazepam (ATIVAN) 2 MG tablet Pt. Is not wanting to have called into [facility].  Please send to [facility]   Has the patient contacted their pharmacy? Yes.   (Agent: If no, request that the patient contact the pharmacy for the refill.) Preferred Pharmacy (with phone number or street name):   [facility], [address] [place] [address] Phone: [phone] Fax: [phone]   Agent: Please be advised that RX refills may take up to 3 business days. We ask that you follow-up with your pharmacy.

## 2017-10-18 ENCOUNTER — Other Ambulatory Visit: Payer: Self-pay | Admitting: Family Medicine

## 2017-10-18 NOTE — Telephone Encounter (Signed)
Spoke to patient by telephone and advised her that script was sent to [facility] on 10/06/17. Patient stated that she spoke with pharmacist at [facility] and was advised that they are out of stock on this medication and it is on backorder and she would like to switch it to [facility].  Called and spoke to [employer] at [place] and she verified what the patient had said. Amber stated that they received a shipment today, but not sure if Lorazepam is in it. Advised Amber to cancel the script there and Dr. Damita Dunnings will send it to another pharmacy per patient's request.

## 2017-10-19 MED ORDER — LORAZEPAM 2 MG PO TABS: ORAL_TABLET | ORAL | 2 refills | Status: DC

## 2017-10-19 NOTE — Telephone Encounter (Signed)
Patient advised.

## 2017-10-19 NOTE — Telephone Encounter (Signed)
Sent. Thanks.  Have her keep the rx at that pharmacy, meaning pick one pharmacy from here on for this rx.

## 2017-10-19 NOTE — Telephone Encounter (Signed)
And please cancel the refills that she will no longer need at [facility].  Thanks.

## 2017-10-19 NOTE — Telephone Encounter (Signed)
Thanks.  See below.  

## 2017-10-19 NOTE — Telephone Encounter (Signed)
Done

## 2017-11-10 ENCOUNTER — Encounter: Payer: Self-pay | Admitting: Family Medicine

## 2017-11-11 ENCOUNTER — Other Ambulatory Visit: Payer: Self-pay | Admitting: Family Medicine

## 2017-11-11 MED ORDER — CYCLOBENZAPRINE HCL 10 MG PO TABS: 5.0000 mg | ORAL_TABLET | Freq: Two times a day (BID) | ORAL | 1 refills | Status: DC | PRN

## 2017-12-05 ENCOUNTER — Other Ambulatory Visit: Payer: Self-pay | Admitting: Family Medicine

## 2018-01-08 ENCOUNTER — Other Ambulatory Visit: Payer: Self-pay | Admitting: Family Medicine

## 2018-01-09 DIAGNOSIS — H10021 Other mucopurulent conjunctivitis, right eye: Secondary | ICD-10-CM | POA: Diagnosis not present

## 2018-01-12 ENCOUNTER — Other Ambulatory Visit: Payer: Self-pay | Admitting: Family Medicine

## 2018-01-17 ENCOUNTER — Other Ambulatory Visit: Payer: Self-pay | Admitting: Family Medicine

## 2018-01-17 NOTE — Telephone Encounter (Signed)
Sent to you in Dr.Duncan's absence  Electronic refill request LOV 10/06/17 Last refill 10/19/17 #150/2refills

## 2018-01-19 NOTE — Telephone Encounter (Signed)
To PCP

## 2018-01-20 DIAGNOSIS — H5203 Hypermetropia, bilateral: Secondary | ICD-10-CM | POA: Diagnosis not present

## 2018-01-20 NOTE — Telephone Encounter (Signed)
Sent. Thanks.   

## 2018-01-21 DIAGNOSIS — G44219 Episodic tension-type headache, not intractable: Secondary | ICD-10-CM | POA: Diagnosis not present

## 2018-02-20 ENCOUNTER — Other Ambulatory Visit: Payer: Self-pay | Admitting: Family Medicine

## 2018-03-22 DIAGNOSIS — R509 Fever, unspecified: Secondary | ICD-10-CM | POA: Diagnosis not present

## 2018-03-22 DIAGNOSIS — R05 Cough: Secondary | ICD-10-CM | POA: Diagnosis not present

## 2018-03-22 DIAGNOSIS — J181 Lobar pneumonia, unspecified organism: Secondary | ICD-10-CM | POA: Diagnosis not present

## 2018-03-22 DIAGNOSIS — R062 Wheezing: Secondary | ICD-10-CM | POA: Diagnosis not present

## 2018-03-27 ENCOUNTER — Ambulatory Visit (INDEPENDENT_AMBULATORY_CARE_PROVIDER_SITE_OTHER): Payer: Medicare Other | Admitting: Family Medicine

## 2018-03-27 ENCOUNTER — Encounter: Payer: Self-pay | Admitting: Family Medicine

## 2018-03-27 VITALS — BP 146/60 | HR 73 | Temp 98.5°F | Ht 60.5 in | Wt 137.8 lb

## 2018-03-27 DIAGNOSIS — J189 Pneumonia, unspecified organism: Secondary | ICD-10-CM | POA: Diagnosis not present

## 2018-03-27 NOTE — Progress Notes (Signed)
Sx started about 1 week ago.  Seen at [facility] 03/22/18.  Dx'd with PNA.  Prev with cough, fever, chills.  She has a 3 doses of levaquin left, she had initially taken it BID.   She is still achy but feels some better.  No fever today or the yesterday.  Cough is better.  Voice is still scratchy.  Eating okay.  No vomiting or diarrhea now, prev with vomiting.  Some occ sputum.    She has some longstanding back pain with some relief from tylenol.    She is still having L ear pain and wanted that checked today.  No right-sided ear pain.  Outside records reviewed.  Outside imaging report reviewed.  See scanned copy.  Meds, vitals, and allergies reviewed.   ROS: Per HPI unless specifically indicated in ROS section   GEN: nad, alert and oriented, she looks like she does not feel as well as she normally does but she clearly is not toxic appearing. HEENT: mucous membranes moist, L ear wax removed, felt better after irrigation.  Recheck wnl.  Minimal nasal irritation.  Oropharynx within normal limits. NECK: supple w/o LA CV: rrr PULM: ctab, no inc wob ABD: soft, +bs EXT: no edema SKIN: no acute rash

## 2018-03-27 NOTE — Patient Instructions (Signed)
Finish the antibiotics.  Take 1 a day.  Recheck chest xray in about 1 month.  Take 2 extra strength tylenol up to 3 times a day for pain.  Update me as needed.

## 2018-03-29 DIAGNOSIS — J189 Pneumonia, unspecified organism: Secondary | ICD-10-CM | POA: Insufficient documentation

## 2018-03-29 NOTE — Assessment & Plan Note (Signed)
Discussed with patient.  Lungs are clear.  She does appear to be improved.  She still a little achy but she clearly feels better.  No fevers today.  Still okay for outpatient follow-up.  Would not need follow-up imaging at this point since she does have clinical response.  Reasonable to recheck in 4 to 6 weeks.  See after visit summary.  All questions answered.  Update me as needed in the meantime.  She agrees. >25 minutes spent in face to face time with patient, >50% spent in counselling or coordination of care.   Incidentally her left ear feels better after irrigation and recheck tympanic membrane within normal limits.

## 2018-04-11 ENCOUNTER — Encounter: Payer: Self-pay | Admitting: Family Medicine

## 2018-04-11 ENCOUNTER — Ambulatory Visit: Payer: Medicare Other | Admitting: Family Medicine

## 2018-04-12 MED ORDER — LORAZEPAM 2 MG PO TABS: ORAL_TABLET | ORAL | 2 refills | Status: DC

## 2018-04-12 NOTE — Telephone Encounter (Signed)
Last office visit 03/27/2018 for PNA.  Last refilled 01/20/2018 for #150 with 2 refills.  Next Appt: 04/25/2018 for follow up.

## 2018-04-12 NOTE — Telephone Encounter (Signed)
Sent. Thanks.   

## 2018-04-13 ENCOUNTER — Other Ambulatory Visit: Payer: Self-pay | Admitting: Family Medicine

## 2018-04-13 DIAGNOSIS — Z1231 Encounter for screening mammogram for malignant neoplasm of breast: Secondary | ICD-10-CM

## 2018-04-25 ENCOUNTER — Encounter: Payer: Self-pay | Admitting: Family Medicine

## 2018-04-25 ENCOUNTER — Ambulatory Visit (INDEPENDENT_AMBULATORY_CARE_PROVIDER_SITE_OTHER)
Admission: RE | Admit: 2018-04-25 | Discharge: 2018-04-25 | Disposition: A | Discharge status: Home / Self Care | Payer: Medicare Other | Source: Ambulatory Visit | Attending: Family Medicine | Admitting: Family Medicine

## 2018-04-25 ENCOUNTER — Ambulatory Visit (INDEPENDENT_AMBULATORY_CARE_PROVIDER_SITE_OTHER): Payer: Medicare Other | Admitting: Family Medicine

## 2018-04-25 VITALS — BP 158/68 | HR 71 | Temp 98.4°F | Ht 60.5 in | Wt 138.5 lb

## 2018-04-25 DIAGNOSIS — E119 Type 2 diabetes mellitus without complications: Secondary | ICD-10-CM | POA: Diagnosis not present

## 2018-04-25 DIAGNOSIS — J189 Pneumonia, unspecified organism: Secondary | ICD-10-CM | POA: Diagnosis not present

## 2018-04-25 DIAGNOSIS — J181 Lobar pneumonia, unspecified organism: Secondary | ICD-10-CM | POA: Diagnosis not present

## 2018-04-25 DIAGNOSIS — F411 Generalized anxiety disorder: Secondary | ICD-10-CM | POA: Diagnosis not present

## 2018-04-25 LAB — POCT GLYCOSYLATED HEMOGLOBIN (HGB A1C): Hemoglobin A1C: 7.7 % — AB (ref 4.0–5.6)

## 2018-04-25 MED ORDER — METFORMIN HCL 500 MG PO TABS: ORAL_TABLET | ORAL | Status: DC

## 2018-04-25 MED ORDER — LEVOFLOXACIN 500 MG PO TABS: 500.0000 mg | ORAL_TABLET | Freq: Every day | ORAL | 0 refills | Status: DC

## 2018-04-25 NOTE — Patient Instructions (Signed)
Restart levaquin.  1 pill a day.   Update me in about 2-3 days.   If worse in the meantime, then go to the ER.  We'll make plans when I hear from your later in the week.    We need to recheck you in about 6 months for diabetes.  Take care.  Glad to see you.

## 2018-04-25 NOTE — Progress Notes (Signed)
91% RA with nail polish.    Diabetes:  Using medications without difficulties: 1 metformin BID.  Hypoglycemic episodes:no Hyperglycemic episodes:no Feet problems: no tingling.  Blood Sugars averaging: ~170s recently.   eye exam within last year: d/w pt.  Done 2019 per patient report.   A1c 7.7.  This is a reasonable goal, <8.    She was doing well with h/o CAP apparently resolved until chills two nights ago.  Some aches in the meantime.  Minimal cough.  No sputum.  She feels a little tired now.  Not SOB.    I looked back at her old records and she did previously have a right lower lobe pneumonia, back in October.  She is had some episodic back pain and we talked about options, can take Tylenol 1 g 3 times a day if needed.  Mood discussed with patient.  Holidays are coming up.  She still on her baseline medication without adverse effect and her mood is overall pretty good.  She will update me as needed.  PMH and SH reviewed Meds, vitals, and allergies reviewed.   ROS: Per HPI unless specifically indicated in ROS section   GEN: nad, alert and oriented HEENT: mucous membranes moist NECK: supple w/o LA CV: rrr. PULM: ctab, no inc wob.  No focal dec in BS.  ABD: soft, +bs EXT: no edema SKIN: no acute rash  Xray with likely RLL PNA.  Discussed with patient at office visit.  Independently reviewed.

## 2018-04-26 ENCOUNTER — Other Ambulatory Visit: Payer: Self-pay | Admitting: Family Medicine

## 2018-04-26 DIAGNOSIS — J189 Pneumonia, unspecified organism: Secondary | ICD-10-CM

## 2018-04-26 NOTE — Assessment & Plan Note (Signed)
Mood discussed with patient.  Holidays are coming up.  She still on her baseline medication without adverse effect and her mood is overall pretty good.  She will update me as needed.

## 2018-04-26 NOTE — Assessment & Plan Note (Signed)
A1c is reasonable at 7.7.  Goal A1c less than 8.  Goal to avoid hypoglycemia.  No change in medication at this point.  She agrees.  Recheck periodically.

## 2018-04-26 NOTE — Assessment & Plan Note (Addendum)
I think she was likely improving and had resolved until she had recent return of symptoms with chills 2 nights ago.  She has some aches in the meantime.  Minimal cough.  Her lungs still sound clear.  I do not hear any focal decrease in breath sounds.  X-ray reviewed independently and discussed with patient at office visit.  Presumed pneumonia.  I presume this to be a separate issue from before, though it is still on the right side.  Start Levaquin.  Routine supportive care otherwise.  Routine instructions given.  I want her to update me about her situation later in the week.  She agrees with plan.  At this point still okay for outpatient follow-up.  She has normal capillary refill and I think that her pulse ox is likely artificially low based on her thick nail polish that can decrease signal transmittance.

## 2018-04-27 ENCOUNTER — Encounter: Payer: Self-pay | Admitting: Family Medicine

## 2018-05-01 ENCOUNTER — Other Ambulatory Visit: Payer: Self-pay | Admitting: Family Medicine

## 2018-05-04 ENCOUNTER — Encounter: Payer: Self-pay | Admitting: Family Medicine

## 2018-05-05 ENCOUNTER — Other Ambulatory Visit: Payer: Self-pay | Admitting: Family Medicine

## 2018-05-23 ENCOUNTER — Ambulatory Visit
Admission: RE | Admit: 2018-05-23 | Discharge: 2018-05-23 | Disposition: A | Discharge status: Home / Self Care | Payer: Medicare Other | Source: Ambulatory Visit | Attending: Family Medicine | Admitting: Family Medicine

## 2018-05-23 DIAGNOSIS — Z1231 Encounter for screening mammogram for malignant neoplasm of breast: Secondary | ICD-10-CM

## 2018-05-25 ENCOUNTER — Ambulatory Visit (INDEPENDENT_AMBULATORY_CARE_PROVIDER_SITE_OTHER): Payer: Medicare Other | Admitting: Family Medicine

## 2018-05-25 ENCOUNTER — Encounter: Payer: Self-pay | Admitting: Family Medicine

## 2018-05-25 ENCOUNTER — Ambulatory Visit (INDEPENDENT_AMBULATORY_CARE_PROVIDER_SITE_OTHER)
Admission: RE | Admit: 2018-05-25 | Discharge: 2018-05-25 | Disposition: A | Discharge status: Home / Self Care | Payer: Medicare Other | Source: Ambulatory Visit | Attending: Family Medicine | Admitting: Family Medicine

## 2018-05-25 VITALS — BP 160/78 | HR 73 | Temp 98.1°F | Ht 60.5 in | Wt 135.2 lb

## 2018-05-25 DIAGNOSIS — M62830 Muscle spasm of back: Secondary | ICD-10-CM

## 2018-05-25 DIAGNOSIS — J189 Pneumonia, unspecified organism: Secondary | ICD-10-CM | POA: Diagnosis not present

## 2018-05-25 MED ORDER — TRAMADOL HCL 50 MG PO TABS: 50.0000 mg | ORAL_TABLET | Freq: Two times a day (BID) | ORAL | 1 refills | Status: DC | PRN

## 2018-05-25 NOTE — Progress Notes (Signed)
She feels better overall but still has some pain from the L hip down to the L knee.  Heat helped.  Then she had return of sx after she stopped using the heat.  Aching.  Not burning.  No rash.  Better today.  D/w pt.  It could have been pain along her L ITB.    She still has some back pain, across the upper back.  Resting on heating pad helps.  She has used tylenol with a little help but not a lot.    BP elevation. She had a long trip to get here and that was stressful, d/w pt. recheck blood pressure improved and I do not want to induce hypotension.  160/78 recheck BP  PNA follow up. CXR pending. Not SOB.  Can take a deep breath.  No fevers.  No cough.  She feels some better but still with some fatigue.  ROS: Per HPI unless specifically indicated in ROS section   Meds, vitals, and allergies reviewed.   GEN: nad, alert and oriented HEENT: mucous membranes moist NECK: supple w/o LA CV: rrr. PULM: ctab, no inc wob ABD: soft, +bs EXT: no edema SKIN: no acute rash Left thigh not tender to palpation.  Able to bear weight.

## 2018-05-25 NOTE — Patient Instructions (Signed)
Go to the lab on the way out.  We'll contact you with your xray report. Try taking tramadol for back pain if tylenol and heat don't help enough.  Sedation caution.  Update me as needed.  Take care.  Glad to see you.

## 2018-05-28 NOTE — Assessment & Plan Note (Signed)
Discussed with patient about options. She can try taking tramadol for back pain if tylenol and heat don't help enough.  Sedation caution.  Update me as needed.  She agrees.

## 2018-05-28 NOTE — Assessment & Plan Note (Signed)
She sounds clear on recheck.  No fevers.  Breathing is clearly better.  Done with antibiotics.  She can take a deep breath without a cough.  She still has some fatigue but I was expecting this, discussed.  See notes on imaging. >25 minutes spent in face to face time with patient, >50% spent in counselling or coordination of care.

## 2018-05-31 ENCOUNTER — Other Ambulatory Visit: Payer: Self-pay | Admitting: Family Medicine

## 2018-06-24 ENCOUNTER — Other Ambulatory Visit: Payer: Self-pay | Admitting: Family Medicine

## 2018-07-18 ENCOUNTER — Other Ambulatory Visit: Payer: Self-pay | Admitting: Family Medicine

## 2018-07-18 NOTE — Telephone Encounter (Signed)
Electronic refill request. Lorazepam Last office visit:   05/25/18 Last Filled:    150 tablet 2 04/12/2018  Please advise.

## 2018-07-19 NOTE — Telephone Encounter (Signed)
Sent. Thanks.   

## 2018-08-05 ENCOUNTER — Other Ambulatory Visit: Payer: Self-pay | Admitting: Family Medicine

## 2018-10-09 ENCOUNTER — Telehealth: Payer: Self-pay

## 2018-10-09 ENCOUNTER — Ambulatory Visit (INDEPENDENT_AMBULATORY_CARE_PROVIDER_SITE_OTHER): Payer: Medicare Other

## 2018-10-09 ENCOUNTER — Other Ambulatory Visit: Payer: Self-pay

## 2018-10-09 DIAGNOSIS — Z0000 Encounter for general adult medical examination without abnormal findings: Secondary | ICD-10-CM | POA: Diagnosis not present

## 2018-10-09 NOTE — Patient Instructions (Signed)
[name] , Thank you for taking time to come for your Medicare Wellness Visit. I appreciate your ongoing commitment to your health goals. Please review the following plan we discussed and let me know if I can assist you in the future.   These are the goals we discussed: Goals    . DIET - INCREASE WATER INTAKE     Starting 10/09/2018, I will attempt to drink at least 4-6 glasses of water daily.        This is a list of the screening recommended for you and due dates:  Health Maintenance  Topic Date Due  . Complete foot exam   06/14/2019*  . Eye exam for diabetics  06/14/2019*  . Urine Protein Check  06/14/2019*  . Tetanus Vaccine  06/13/2020*  . Hemoglobin A1C  10/24/2018  . Flu Shot  01/13/2019  . DEXA scan (bone density measurement)  Completed  . Pneumonia vaccines  Completed  *Topic was postponed. The date shown is not the original due date.   Preventive Care for Adults  A healthy lifestyle and preventive care can promote health and wellness. Preventive health guidelines for adults include the following key practices.  . A routine yearly physical is a good way to check with your health care provider about your health and preventive screening. It is a chance to share any concerns and updates on your health and to receive a thorough exam.  . Visit your dentist for a routine exam and preventive care every 6 months. Brush your teeth twice a day and floss once a day. Good oral hygiene prevents tooth decay and gum disease.  . The frequency of eye exams is based on your age, health, family medical history, use  of contact lenses, and other factors. Follow your health care provider's recommendations for frequency of eye exams.  . Eat a healthy diet. Foods like vegetables, fruits, whole grains, low-fat dairy products, and lean protein foods contain the nutrients you need without too many calories. Decrease your intake of foods high in solid fats, added sugars, and salt. Eat the right  amount of calories for you. Get information about a proper diet from your health care provider, if necessary.  . Regular physical exercise is one of the most important things you can do for your health. Most adults should get at least 150 minutes of moderate-intensity exercise (any activity that increases your heart rate and causes you to sweat) each week. In addition, most adults need muscle-strengthening exercises on 2 or more days a week.  Silver Sneakers may be a benefit available to you. To determine eligibility, you may visit the website: www.silversneakers.com or contact program at (331)391-2452 Mon-Fri between 8AM-8PM.   . Maintain a healthy weight. The body mass index (BMI) is a screening tool to identify possible weight problems. It provides an estimate of body fat based on height and weight. Your health care provider can find your BMI and can help you achieve or maintain a healthy weight.   For adults 20 years and older: ? A BMI below 18.5 is considered underweight. ? A BMI of 18.5 to 24.9 is normal. ? A BMI of 25 to 29.9 is considered overweight. ? A BMI of 30 and above is considered obese.   . Maintain normal blood lipids and cholesterol levels by exercising and minimizing your intake of saturated fat. Eat a balanced diet with plenty of fruit and vegetables. Blood tests for lipids and cholesterol should begin at age 52 and be  repeated every 5 years. If your lipid or cholesterol levels are high, you are over 50, or you are at high risk for heart disease, you may need your cholesterol levels checked more frequently. Ongoing high lipid and cholesterol levels should be treated with medicines if diet and exercise are not working.  . If you smoke, find out from your health care provider how to quit. If you do not use tobacco, please do not start.  . If you choose to drink alcohol, please do not consume more than 2 drinks per day. One drink is considered to be 12 ounces (355 mL) of beer, 5  ounces (148 mL) of wine, or 1.5 ounces (44 mL) of liquor.  . If you are 41-61 years old, ask your health care provider if you should take aspirin to prevent strokes.  . Use sunscreen. Apply sunscreen liberally and repeatedly throughout the day. You should seek shade when your shadow is shorter than you. Protect yourself by wearing long sleeves, pants, a wide-brimmed hat, and sunglasses year round, whenever you are outdoors.  . Once a month, do a whole body skin exam, using a mirror to look at the skin on your back. Tell your health care provider of new moles, moles that have irregular borders, moles that are larger than a pencil eraser, or moles that have changed in shape or color.

## 2018-10-09 NOTE — Progress Notes (Signed)
Subjective:   [name] is a 78 y.o. female who presents for Medicare Annual (Subsequent) preventive examination.  Review of Systems:  N/A Cardiac Risk Factors include: advanced age (>34mn, >>41women);diabetes mellitus;hypertension     Objective:     Vitals: There were no vitals taken for this visit.  There is no height or weight on file to calculate BMI.  Advanced Directives 10/09/2018 10/06/2017 01/05/2017 12/06/2016 09/28/2016 05/14/2016 05/12/2016  Does Patient Have a Medical Advance Directive? No _0  Yes  Type of Advance Directive - [place] Living will [place] Healthcare Power of [place]  Does patient want to make changes to medical advance directive? - - - - - No - Patient declined -  Copy of [place] Chart? - No - copy requested - - No - copy requested No - copy requested No - copy requested  Would patient like information on creating a medical advance directive? No - Patient declined - - - - - -    Tobacco Social History   Tobacco Use  Smoking Status Never Smoker  Smokeless Tobacco Never Used     Counseling given: No   Clinical Intake:  Pre-visit preparation completed: Yes  Pain : No/denies pain Pain Score: 0-No pain     Nutritional Risks: None Diabetes: Yes CBG done?: No Did pt. bring in CBG monitor from home?: No  How often do you need to have someone help you when you read instructions, pamphlets, or other written materials from your doctor or pharmacy?: 1 - Never What is the last grade level you completed in school?: GED  Interpreter Needed?: No  Comments: pt lives with daughter and her children Information entered by :: LPinson, LPN  Past Medical History:  Diagnosis Date  . Anemia   . Anxiety   . Blood transfusion   . Blood transfusion without reported diagnosis 1983   prbc  . [place]   left breast lumpectomy=invasive ductal ca,grade I/III,INVILVES SKELATAL MUSCLE   er=+,pr=NEG. HER2 NEG.  . Cancer ([facility] DX    LEFT  . Depression   . Diabetes mellitus 2011   type II, on pills   . Diverticulosis   . Dyspnea    with exertion if walks a long ways per patient   . GERD (gastroesophageal reflux disease)   . Headache   . History of radiation therapy    RIGHT JAW 5 TXS FOR ABSESSED TOOTH IN HER 30'S   . History of radiation therapy 04/10/12-05/26/12   left breast  . Hyperlipidemia   . Hypertension   . Insomnia, unspecified   . Osteoporosis, unspecified    with prev fosamax treatment  . Other abnormal glucose   . Personal history of radiation therapy   . PONV (postoperative nausea and vomiting)    Past Surgical History:  Procedure Laterality Date  . ABDOMINAL HYSTERECTOMY  1983   b/ls alpingooph orectomy 4 years later  . BILATERAL OOPHORECTOMY  1987  . BREAST BIOPSY    . BREAST LUMPECTOMY Left 03/01/12   LEFT=INVASIVE DUCTAL CA,gRADE i/iii,INVASION AT PSTERIOR RESECTION MARGIN AND INVLOVES SKE;ATAL MUSCLE,PERINEURAL INVASION IDENTIFIED  . INGUINAL HERNIA REPAIR Left 1999   LIH  . ORIF FEMUR FRACTURE Right    titanium rod  . SIGMOIDOSCOPY    . TUBAL LIGATION  1975   Family History  Problem Relation Age of Onset  .  Diabetes Mother   . Alcohol abuse Father   . Diabetes Sister   . Parkinson's disease Sister   . Bladder Cancer Brother   . Diabetes Sister   . Bladder Cancer Son   . Colon cancer Neg Hx   . Esophageal cancer Neg Hx   . Pancreatic cancer Neg Hx   . Rectal cancer Neg Hx   . Stomach cancer Neg Hx   . Breast cancer Neg Hx    Social History   Socioeconomic History  . Marital status: Widowed    Spouse name: Not on file  . Number of children: 2  . Years of education: Not on file  . Highest education level: Not on file  Occupational History  . 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 Financial  resource strain: Not on file  . Food insecurity:    Worry: Not on file    Inability: Not on file  . Transportation needs:    Medical: Not on file    Non-medical: Not on file  Tobacco Use  . Smoking status: Never Smoker  . Smokeless tobacco: Never Used  Substance and Sexual Activity  . Alcohol use: No    Alcohol/week: 0.0 standard drinks  . Drug use: No  . Sexual activity: Not Currently    Birth control/protection: Post-menopausal    Comment: gxp2, pregnancy 1st child age 69, 2 step children, no hrt  Lifestyle  . Physical activity:    Days per week: Not on file    Minutes per session: Not on file  . Stress: Not on file  Relationships  . Social connections:    Talks on phone: Not on file    Gets together: Not on file    Attends religious service: Not on file    Active member of club or organization: Not on file    Attends meetings of clubs or organizations: Not on file    Relationship status: Not on file  Other Topics Concern  . Not on file  Social History Narrative   Widowed 2014.    Living with her daughter.      Outpatient Encounter Medications as of 10/09/2018  Medication Sig  . ACCU-CHEK AVIVA PLUS test strip USE ONE STRIP TO CHECK GLUCOSE ONCE DAILY  . ACCU-CHEK SOFTCLIX LANCETS lancets USE ONCE DAILY AS DIRECTED.  E11.9  . amLODipine (NORVASC) 5 MG tablet TAKE 1 TABLET BY MOUTH ONCE DAILY  . Blood Glucose Monitoring Suppl (ACCU-CHEK AVIVA PLUS) w/Device KIT Use as instructed to test blood sugar once daily or as directed.  Dx:  E11.9  Non-insulin dependent.  Marland Kitchen buPROPion (WELLBUTRIN SR) 100 MG 12 hr tablet TAKE 1 TABLET BY MOUTH ONCE DAILY  . fluticasone (FLONASE) 50 MCG/ACT nasal spray Place 2 sprays into both nostrils daily.  Marland Kitchen glucose blood (ACCU-CHEK AVIVA PLUS) test strip USE ONE STRIP TO CHECK GLUCOSE ONCE DAILY.  E11.9  . LORazepam (ATIVAN) 2 MG tablet Take 1 tablet by mouth 3 times daily as needed and 1 or 2 tablets at bedtime.  . metFORMIN (GLUCOPHAGE) 500 MG  tablet TAKE 1 TABLET BY MOUTH TWICE DAILY WITH MEALS  . metoprolol tartrate (LOPRESSOR) 50 MG tablet TAKE 1 TABLET BY MOUTH TWICE DAILY  . Omega-3 Fatty Acids (FISH OIL) 1000 MG CAPS Take 1,000 mg by mouth 2 (two) times daily.   Marland Kitchen omeprazole (PRILOSEC) 20 MG capsule TAKE 1 CAPSULE BY MOUTH TWICE DAILY AS NEEDED  . sertraline (ZOLOFT) 100 MG tablet  TAKE 1 TABLET BY MOUTH ONCE DAILY IN THE MORNING  . traMADol (ULTRAM) 50 MG tablet Take 1 tablet (50 mg total) by mouth every 12 (twelve) hours as needed (for pain not improved with tylenol).  . cholecalciferol (VITAMIN D) 1000 UNITS tablet Take 1,000 Units by mouth daily.   No facility-administered encounter medications on file as of 10/09/2018.     Activities of Daily Living In your present state of health, do you have any difficulty performing the following activities: 10/09/2018  Hearing? N  Vision? N  Difficulty concentrating or making decisions? N  Walking or climbing stairs? N  Dressing or bathing? N  Doing errands, shopping? N  Preparing Food and eating ? N  Using the Toilet? N  In the past six months, have you accidently leaked urine? N  Do you have problems with loss of bowel control? N  Managing your Medications? N  Managing your Finances? N  Housekeeping or managing your Housekeeping? N  Some recent data might be hidden    Patient Care Team: Tonia Ghent, MD as PCP - Huston Foley, MD as Consulting Physician (General Surgery) Alden Hipp, MD as Consulting Physician (Obstetrics and Gynecology) Irene Shipper, MD as Consulting Physician (Gastroenterology)    Assessment:   This is a routine wellness examination for [place].  Vision Screening Comments: Vision exam in 2019  Exercise Activities and Dietary recommendations Current Exercise Habits: The patient does not participate in regular exercise at present, Exercise limited by: None identified  Goals    . DIET - INCREASE WATER INTAKE     Starting 10/09/2018,  I will attempt to drink at least 4-6 glasses of water daily.        Fall Risk Fall Risk  10/09/2018 10/06/2017 10/06/2017 09/28/2016 01/21/2015  Falls in the past year? 0 Yes Yes No Yes  Comment - June 2018; broke arm after climbing up on barstool. stool turned over and she fell on top of arm - - -  Number falls in past yr: - 1 1 - 2 or more  Injury with Fall? - Yes Yes - Yes   Depression Screen PHQ 2/9 Scores 10/09/2018 10/06/2017 10/06/2017 09/28/2016  PHQ - 2 Score 0 0 0 2  PHQ- 9 Score 0 0 - 3     Cognitive Function MMSE - Mini Mental State Exam 10/09/2018 10/06/2017 09/28/2016  Orientation to time _0 Orientation to Place _1 Registration _2 Attention/ Calculation 0 0 0  Recall _3 Language- name 2 objects 0 0 0  Language- repeat _4 Language- follow 3 step command 0 3 3  Language- read & follow direction 0 0 0  Write a sentence 0 0 0  Copy design 0 0 0  Total score _5 PLEASE NOTE: A Mini-Cog screen was completed. Maximum score is 17. A value of 0 denotes this part of Folstein MMSE was not completed or the patient failed this part of the Mini-Cog screening.   Mini-Cog Screening Orientation to Time - Max 5 pts Orientation to Place - Max 5 pts Registration - Max 3 pts Recall - Max 3 pts Language Repeat - Max 1 pts      Immunization History  Administered Date(s) Administered  . Influenza Split 03/30/2011  . Influenza Whole 04/04/2009, 05/19/2010  . Influenza, Seasonal, Injecte, Preservative Fre 05/24/2012  . Influenza,inj,Quad PF,6+ Mos 03/28/2013, 02/14/2014, 08/04/2015  .  Influenza-Unspecified 05/08/2016  . Pneumococcal Conjugate-13 01/21/2015  . Pneumococcal Polysaccharide-23 06/15/2003, 05/28/2016  . Td 07/13/2007    Screening Tests Health Maintenance  Topic Date Due  . OPHTHALMOLOGY EXAM  07/05/2017  . TETANUS/TDAP  07/12/2017  . FOOT EXAM  10/07/2018  . URINE MICROALBUMIN  10/07/2018  . HEMOGLOBIN A1C  10/24/2018  . INFLUENZA  VACCINE  01/13/2019  . DEXA SCAN  Completed  . PNA vac Low Risk Adult  Completed         Plan:     I have personally reviewed, addressed, and noted the following in the patient's chart:  A. Medical and social history B. Use of alcohol, tobacco or illicit drugs  C. Current medications and supplements D. Functional ability and status E.  Nutritional status F.  Physical activity G. Advance directives H. List of other physicians I.  Hospitalizations, surgeries, and ER visits in previous 12 months J.  Vitals (unless it is a telemedicine encounter) K. Screenings to include cognitive, depression, hearing, vision (NOTE: hearing and vision screenings not completed in telemedicine encounter) L. Referrals and appointments   In addition, I have reviewed and discussed with patient certain preventive protocols, quality metrics, and best practice recommendations. A written personalized care plan for preventive services and recommendations were provided to patient.  With patient's permission, we connected on 10/09/18 at  1:30 PM EDT by a video enabled telemedicine application. Two patient identifiers were used to ensure the encounter occurred with the correct person.    Patient was in home and writer was in office.   Signed,   Lindell Noe, MHA, BS, LPN Health Coach

## 2018-10-09 NOTE — Progress Notes (Signed)
PCP notes:   Health maintenance:  Eye exam - addressed Microalbumin - PCP follow-up requested Foot exam - postponed Tetanus vaccine - addressed  Abnormal screenings:   None  Patient concerns:   Refill request for Tramadol - forwarded to PCP  Nurse concerns:  None  Next PCP appt:   10/13/18 @ 1400  I reviewed health advisor's note, was available for consultation on the day of service listed in this note, and agree with documentation and plan. Elsie Stain, MD.

## 2018-10-09 NOTE — Telephone Encounter (Signed)
During AWV, patient requested refill for Tramadol.  Last fill date: 05/25/18 Count: 30 Refill: 1 Last appt: 05/25/18 Next appt: 10/13/18 @ 1400  Pharmacy: [facility] - on EMR

## 2018-10-10 ENCOUNTER — Other Ambulatory Visit: Payer: Self-pay | Admitting: Family Medicine

## 2018-10-10 DIAGNOSIS — M810 Age-related osteoporosis without current pathological fracture: Secondary | ICD-10-CM

## 2018-10-10 DIAGNOSIS — E119 Type 2 diabetes mellitus without complications: Secondary | ICD-10-CM

## 2018-10-10 MED ORDER — TRAMADOL HCL 50 MG PO TABS: 50.0000 mg | ORAL_TABLET | Freq: Two times a day (BID) | ORAL | 1 refills | Status: DC | PRN

## 2018-10-10 NOTE — Telephone Encounter (Signed)
Sent. Thanks.   

## 2018-10-11 ENCOUNTER — Other Ambulatory Visit: Payer: Self-pay

## 2018-10-11 ENCOUNTER — Ambulatory Visit: Payer: Medicare Other | Admitting: *Deleted

## 2018-10-11 ENCOUNTER — Other Ambulatory Visit (INDEPENDENT_AMBULATORY_CARE_PROVIDER_SITE_OTHER): Payer: Medicare Other

## 2018-10-11 VITALS — BP 158/76 | HR 64 | Temp 98.4°F

## 2018-10-11 DIAGNOSIS — M810 Age-related osteoporosis without current pathological fracture: Secondary | ICD-10-CM | POA: Diagnosis not present

## 2018-10-11 DIAGNOSIS — E119 Type 2 diabetes mellitus without complications: Secondary | ICD-10-CM

## 2018-10-11 DIAGNOSIS — I10 Essential (primary) hypertension: Secondary | ICD-10-CM

## 2018-10-11 LAB — CBC WITH DIFFERENTIAL/PLATELET
Basophils Absolute: 0.1 10*3/uL (ref 0.0–0.1)
Basophils Relative: 1 % (ref 0.0–3.0)
Eosinophils Absolute: 0.1 10*3/uL (ref 0.0–0.7)
Eosinophils Relative: 1.9 % (ref 0.0–5.0)
HCT: 40 % (ref 36.0–46.0)
Hemoglobin: 13.5 g/dL (ref 12.0–15.0)
Lymphocytes Relative: 25.7 % (ref 12.0–46.0)
Lymphs Abs: 1.7 10*3/uL (ref 0.7–4.0)
MCHC: 33.9 g/dL (ref 30.0–36.0)
MCV: 89.9 fl (ref 78.0–100.0)
Monocytes Absolute: 0.4 10*3/uL (ref 0.1–1.0)
Monocytes Relative: 5.7 % (ref 3.0–12.0)
Neutro Abs: 4.5 10*3/uL (ref 1.4–7.7)
Neutrophils Relative %: 65.7 % (ref 43.0–77.0)
Platelets: 240 10*3/uL (ref 150.0–400.0)
RBC: 4.45 Mil/uL (ref 3.87–5.11)
RDW: 13.7 % (ref 11.5–15.5)
WBC: 6.8 10*3/uL (ref 4.0–10.5)

## 2018-10-11 LAB — LIPID PANEL
Cholesterol: 173 mg/dL (ref 0–200)
HDL: 28 mg/dL — ABNORMAL LOW (ref >39.00)
LDL Cholesterol: 111 mg/dL — ABNORMAL HIGH (ref 0–99)
NonHDL: 144.62
Total CHOL/HDL Ratio: 6
Triglycerides: 166 mg/dL — ABNORMAL HIGH (ref 0.0–149.0)
VLDL: 33.2 mg/dL (ref 0.0–40.0)

## 2018-10-11 LAB — COMPREHENSIVE METABOLIC PANEL
ALT: 12 U/L (ref 0–35)
AST: 17 U/L (ref 0–37)
Albumin: 4.1 g/dL (ref 3.5–5.2)
Alkaline Phosphatase: 54 U/L (ref 39–117)
BUN: 12 mg/dL (ref 6–23)
CO2: 26 mEq/L (ref 19–32)
Calcium: 9.5 mg/dL (ref 8.4–10.5)
Chloride: 100 mEq/L (ref 96–112)
Creatinine, Ser: 0.62 mg/dL (ref 0.40–1.20)
GFR: 92.7 mL/min (ref >60.00)
Glucose, Bld: 188 mg/dL — ABNORMAL HIGH (ref 70–99)
Potassium: 4 mEq/L (ref 3.5–5.1)
Sodium: 136 mEq/L (ref 135–145)
Total Bilirubin: 0.4 mg/dL (ref 0.2–1.2)
Total Protein: 7.4 g/dL (ref 6.0–8.3)

## 2018-10-11 LAB — MICROALBUMIN / CREATININE URINE RATIO
Creatinine,U: 39.1 mg/dL
Microalb Creat Ratio: 1.8 mg/g (ref 0.0–30.0)
Microalb, Ur: <0.7 mg/dL (ref 0.0–1.9)

## 2018-10-11 LAB — HEMOGLOBIN A1C: Hgb A1c MFr Bld: 8.7 % — ABNORMAL HIGH (ref 4.6–6.5)

## 2018-10-11 LAB — VITAMIN D 25 HYDROXY (VIT D DEFICIENCY, FRACTURES): VITD: 46.27 ng/mL (ref 30.00–100.00)

## 2018-10-11 NOTE — Progress Notes (Signed)
Patient has appointment with Dr Damita Dunnings for Part 2 Annual on 10/13/2018  Patient presents today for a nurse visit blood pressure check for ongoing follow up and management.  Vital Sign Readings today 158/76.  Patient stated that she have not taken BP medication today.

## 2018-10-13 ENCOUNTER — Ambulatory Visit: Payer: Medicare Other

## 2018-10-13 ENCOUNTER — Ambulatory Visit (INDEPENDENT_AMBULATORY_CARE_PROVIDER_SITE_OTHER): Payer: Medicare Other | Admitting: Family Medicine

## 2018-10-13 ENCOUNTER — Encounter: Payer: Medicare Other | Admitting: Family Medicine

## 2018-10-13 ENCOUNTER — Encounter: Payer: Self-pay | Admitting: Family Medicine

## 2018-10-13 DIAGNOSIS — M549 Dorsalgia, unspecified: Secondary | ICD-10-CM | POA: Diagnosis not present

## 2018-10-13 DIAGNOSIS — Z7189 Other specified counseling: Secondary | ICD-10-CM

## 2018-10-13 DIAGNOSIS — Z0000 Encounter for general adult medical examination without abnormal findings: Secondary | ICD-10-CM

## 2018-10-13 DIAGNOSIS — E119 Type 2 diabetes mellitus without complications: Secondary | ICD-10-CM

## 2018-10-13 DIAGNOSIS — F411 Generalized anxiety disorder: Secondary | ICD-10-CM | POA: Diagnosis not present

## 2018-10-13 MED ORDER — LORAZEPAM 2 MG PO TABS: ORAL_TABLET | ORAL | 2 refills | Status: DC

## 2018-10-13 NOTE — Progress Notes (Addendum)
Interactive audio and video telecommunications were attempted between this provider and patient, however failed, due to patient having technical difficulties OR patient did not have access to video capability.  We continued and completed visit with audio only.   Virtual Visit via Telephone Note  I connected with patient on 10/13/18 at 2:10 by telephone and verified that I am speaking with the correct person using two identifiers.  Location of patient: home  Location of MD: [place] Name of referring provider (if blank then none associated): Names per persons and role in encounter:  MD: Earlyne Iba, Patient: name listed above.    I discussed the limitations, risks, security and privacy concerns of performing an evaluation and management service by telephone and the availability of in person appointments. I also discussed with the patient that there may be a patient responsible charge related to this service. The patient expressed understanding and agreed to proceed.  History of Present Illness:   She is living with her daughter, who is thinking about moving.  She is considering options about housing.    Covid considerations d/w pt.    H/o osteoporosis.  Given her history of atypical long bone fracture it likely makes sense not to start another medication for osteoporosis. She agrees.     Hypertension:               Using medication without problems or lightheadedness: yes Chest pain with exertion:no Edema:no Short of breath:no  Diabetes:  Using medications without difficulties:yes, taking metformin 1 tab BID.   Hypoglycemic episodes: no Hyperglycemic episodes:no Feet problems:no Blood Sugars averaging:  ~150, none <100 or >200 eye exam within last year:d/w pt Prev labs d/w pt.   MALB neg d/w pt.  Statin intolerant.  D/w pt.    She has sinus pressure and d/w pt about using warm compresses as that can help.  She has some occ rhinorrhea.  No fevers.  She'll update me as  needed.   Mood d/w pt.  She is living with her daughter and she is getting by with that.  discussed the death of her grandson who previously committed suicide.  She is still on SSRI with wellbutrin and prn BZD and she thought the combination was helpful.  "My nerves would probably be cracked without it."  D/w pt.  No Si/Hi.  Sleep is disrupted per patient report.   She has hope about her situation.  no sedation on meds. "I stay strong."    Back pain (in variable places) and tramadol used prn.  She has some L leg pain.  Pain can radiate from back/buttock into the L leg.  No trauma.  Tramadol helps, used as needed w/o ADE.  Heating pad helps some.  Pain noted most days, but worse after prolonged activity or if she is tired.  We'll see about getting a T and L spine xray set up.   Flu prev done  Shingles deferred given pandemic.   PNA up to date Tetanus 2009, can be done at pharmacy.   Colonoscopy 2015  Breast cancer screening- 05/2018 Advance directive - daughter Otis Peak designated if patient were incapacitated.  Observations/Objective:nad Speaking in complete sentences.    Assessment and Plan:  H/o osteoporosis.  Given her history of atypical long bone fracture it likely makes sense not to start another medication for osteoporosis. She agrees.     Hypertension: Continue as is.  No change in medications.  Diabetes:  Using medications without difficulties:yes, taking metformin 1  tab BID.   Statin intolerant.  D/w pt.   Recheck A1c in about 3 months.  She'll work on cutting out sweets in the meantime.    She has sinus pressure and d/w pt about using warm compresses as that can help.  She has some occ rhinorrhea.  No fevers.  She'll update me as needed.   Mood d/w pt.  She is living with her daughter and she is getting by with that.  discussed the death of her grandson who previously committed suicide.  She is still on SSRI with wellbutrin and prn BZD and she thought the combination was  helpful.  "My nerves would probably be cracked without it."  D/w pt.  No Si/Hi.  Sleep is disrupted per patient report.   She has hope about her situation.  no sedation on meds. "I stay strong."    Back pain (in variable places) and tramadol used prn.  She has some L leg pain.  Pain can radiate from back/buttock into the L leg.  No trauma.  Tramadol helps, used as needed w/o ADE.  Heating pad helps some.  Pain noted most days, but worse after prolonged activity or if she is tired.  We'll see about getting a T and L spine xray set up.  D/w pt.   Flu prev done  Shingles deferred given pandemic.   PNA up to date Tetanus 2009, can be done at pharmacy.   Colonoscopy 2015  Breast cancer screening- 05/2018 Advance directive - daughter Otis Peak designated if patient were incapacitated.  Follow Up Instructions:   I discussed the assessment and treatment plan with the patient. The patient was provided an opportunity to ask questions and all were answered. The patient agreed with the plan and demonstrated an understanding of the instructions.   The patient was advised to call back or seek an in-person evaluation if the symptoms worsen or if the condition fails to improve as anticipated.  I provided 30 minutes of non-face-to-face time during this encounter.  Elsie Stain, MD

## 2018-10-16 ENCOUNTER — Telehealth: Payer: Self-pay | Admitting: Family Medicine

## 2018-10-16 DIAGNOSIS — Z0000 Encounter for general adult medical examination without abnormal findings: Secondary | ICD-10-CM | POA: Insufficient documentation

## 2018-10-16 DIAGNOSIS — M549 Dorsalgia, unspecified: Secondary | ICD-10-CM

## 2018-10-16 NOTE — Telephone Encounter (Signed)
Thanks.  Will await the xrays.

## 2018-10-16 NOTE — Telephone Encounter (Signed)
Called patient and changed location to [facility] location for xrays. Patient will go to [facility] [employer] from 8:30 to 4pm sometime this week.

## 2018-10-16 NOTE — Telephone Encounter (Signed)
She needs T and L spine series set up.  Where can she get this done that would be closer to her (instead of coming to [facility])?  I put in the orders.  Thanks.

## 2018-10-16 NOTE — Assessment & Plan Note (Signed)
See above.  No adverse effect on medication.  Would continue as is.  She agrees.

## 2018-10-16 NOTE — Assessment & Plan Note (Signed)
Using medications without difficulties:yes, taking metformin 1 tab BID.   Statin intolerant.  D/w pt.   Recheck A1c in about 3 months.  She'll work on cutting out sweets in the meantime.

## 2018-10-16 NOTE — Assessment & Plan Note (Signed)
  Flu prev done  Shingles deferred given pandemic.   PNA up to date Tetanus 2009, can be done at pharmacy.   Colonoscopy 2015  Breast cancer screening- 05/2018 Advance directive - daughter Otis Peak designated if patient were incapacitated.

## 2018-10-16 NOTE — Assessment & Plan Note (Signed)
Back pain (in variable places in the T and L-spine) and tramadol used prn.  She has some L leg pain.  Pain can radiate from back/buttock into the L leg.  No trauma.  Tramadol helps, used as needed w/o ADE.  Heating pad helps some.  Pain noted most days, but worse after prolonged activity or if she is tired.  We'll se about getting a T and L spine xray set up.  D/w pt.

## 2018-10-16 NOTE — Assessment & Plan Note (Signed)
Advance directive - daughter Otis Peak designated if patient were incapacitated.

## 2018-10-17 ENCOUNTER — Ambulatory Visit (INDEPENDENT_AMBULATORY_CARE_PROVIDER_SITE_OTHER)
Admission: RE | Admit: 2018-10-17 | Discharge: 2018-10-17 | Disposition: A | Discharge status: Home / Self Care | Payer: Medicare Other | Source: Ambulatory Visit | Attending: Family Medicine | Admitting: Family Medicine

## 2018-10-17 ENCOUNTER — Other Ambulatory Visit: Payer: Self-pay

## 2018-10-17 DIAGNOSIS — M549 Dorsalgia, unspecified: Secondary | ICD-10-CM | POA: Diagnosis not present

## 2018-10-17 DIAGNOSIS — M545 Low back pain: Secondary | ICD-10-CM | POA: Diagnosis not present

## 2018-10-17 DIAGNOSIS — M546 Pain in thoracic spine: Secondary | ICD-10-CM | POA: Diagnosis not present

## 2018-10-19 ENCOUNTER — Other Ambulatory Visit: Payer: Self-pay | Admitting: Family Medicine

## 2018-10-19 MED ORDER — CYCLOBENZAPRINE HCL 10 MG PO TABS: 5.0000 mg | ORAL_TABLET | Freq: Two times a day (BID) | ORAL | 1 refills | Status: DC | PRN

## 2018-10-24 ENCOUNTER — Other Ambulatory Visit: Payer: Self-pay | Admitting: Family Medicine

## 2018-11-30 DIAGNOSIS — H2513 Age-related nuclear cataract, bilateral: Secondary | ICD-10-CM | POA: Diagnosis not present

## 2018-11-30 DIAGNOSIS — H40033 Anatomical narrow angle, bilateral: Secondary | ICD-10-CM | POA: Diagnosis not present

## 2018-12-11 ENCOUNTER — Other Ambulatory Visit: Payer: Self-pay | Admitting: Family Medicine

## 2019-01-05 ENCOUNTER — Telehealth: Payer: Self-pay | Admitting: Family Medicine

## 2019-01-05 ENCOUNTER — Encounter: Payer: Self-pay | Admitting: Family Medicine

## 2019-01-05 NOTE — Telephone Encounter (Signed)
I called patient.  She had 3 episodes of painless rectal bleeding, BRBPR.  Last event was 2 nights ago, none since.  No abd pain, no CP, not SOB.  She isn't lightheaded.  She feels okay now.   D/w pt about options.  If any CP, SOB, abd pain, more bleeding, etc, then needs to get checked this weekend.  Routine cautions given to patient.  O/w will route this note to GI clinic for input and hopefully OV early next week.  I appreciate the help of all involved.  Routed to Dr. Henrene Pastor with GI.  Also routed to [place] to check on patient on Monday.

## 2019-01-06 NOTE — Telephone Encounter (Signed)
Noted. Thanks, app GI input.  [place]- please see below and get update on patient Monday.  Thanks.

## 2019-01-06 NOTE — Telephone Encounter (Signed)
Noted. Suspect diverticular bleed. If more bleeding needs to go to hospital. Otherwise, GI follow up with me. Had colonoscopy in 2015

## 2019-01-08 NOTE — Telephone Encounter (Signed)
Patient advised to go to hospital if further bleeding and otherwise, make a follow up appointment with Dr. Henrene Pastor.  Patient understands and says she has not had any more bleeding over the weekend.

## 2019-01-09 NOTE — Telephone Encounter (Signed)
Noted. Thanks.  Will await the GI notes.

## 2019-01-16 ENCOUNTER — Other Ambulatory Visit: Payer: Self-pay | Admitting: Family Medicine

## 2019-01-17 NOTE — Telephone Encounter (Signed)
Sent. Thanks.   

## 2019-01-17 NOTE — Telephone Encounter (Signed)
Electronic refill request Lorazepam Last refill 10/13/18 #150/2 Last office visit 10/13/18

## 2019-01-30 DIAGNOSIS — H00011 Hordeolum externum right upper eyelid: Secondary | ICD-10-CM | POA: Diagnosis not present

## 2019-01-30 DIAGNOSIS — H5789 Other specified disorders of eye and adnexa: Secondary | ICD-10-CM | POA: Diagnosis not present

## 2019-03-08 ENCOUNTER — Telehealth: Payer: Self-pay | Admitting: Family Medicine

## 2019-03-08 NOTE — Telephone Encounter (Signed)
Patient called to find out when her A1c needed to be checked.  According to office note in May, patient was suppose to come back in 3 months.  I scheduled lab appointment and her flu shot on 03/20/19. I didn't see an order in the computer.  Patient,also, wanted to know if she's due for a tetanus shot and pneumonia shot.

## 2019-03-09 NOTE — Telephone Encounter (Signed)
Can she come in for an office visit with me instead of a lab visit?   We can do the A1c at the visit.  She does not need to fast.  We can do the flu shot at the visit. She needs to check with her insurance about the tetanus shot.  It may be cheaper at the pharmacy.  She is up-to-date on her pneumonia shot. Thanks.

## 2019-03-09 NOTE — Telephone Encounter (Signed)
Made office visit appt at the same time as labs/flu shot was scheduled.  Left detailed message on voicemail about the change.

## 2019-03-20 ENCOUNTER — Ambulatory Visit (INDEPENDENT_AMBULATORY_CARE_PROVIDER_SITE_OTHER): Payer: Medicare Other | Admitting: Family Medicine

## 2019-03-20 ENCOUNTER — Other Ambulatory Visit: Payer: Medicare Other

## 2019-03-20 ENCOUNTER — Encounter: Payer: Self-pay | Admitting: Family Medicine

## 2019-03-20 ENCOUNTER — Ambulatory Visit: Payer: Medicare Other

## 2019-03-20 ENCOUNTER — Other Ambulatory Visit: Payer: Self-pay

## 2019-03-20 VITALS — BP 122/60 | HR 56 | Temp 97.4°F | Ht 60.5 in | Wt 137.6 lb

## 2019-03-20 DIAGNOSIS — E119 Type 2 diabetes mellitus without complications: Secondary | ICD-10-CM

## 2019-03-20 DIAGNOSIS — R300 Dysuria: Secondary | ICD-10-CM | POA: Diagnosis not present

## 2019-03-20 DIAGNOSIS — Z23 Encounter for immunization: Secondary | ICD-10-CM | POA: Diagnosis not present

## 2019-03-20 DIAGNOSIS — J3489 Other specified disorders of nose and nasal sinuses: Secondary | ICD-10-CM

## 2019-03-20 LAB — POCT URINALYSIS DIPSTICK
Bilirubin, UA: NEGATIVE
Blood, UA: NEGATIVE
Glucose, UA: NEGATIVE
Ketones, UA: NEGATIVE
Leukocytes, UA: NEGATIVE
Nitrite, UA: NEGATIVE
Protein, UA: NEGATIVE
Spec Grav, UA: 1.01 (ref 1.010–1.025)
Urobilinogen, UA: 0.2 E.U./dL
pH, UA: 6 (ref 5.0–8.0)

## 2019-03-20 LAB — POCT GLYCOSYLATED HEMOGLOBIN (HGB A1C): Hemoglobin A1C: 9 % — AB (ref 4.0–5.6)

## 2019-03-20 MED ORDER — SITAGLIPTIN PHOSPHATE 50 MG PO TABS: 25.0000 mg | ORAL_TABLET | Freq: Every day | ORAL | 1 refills | Status: DC

## 2019-03-20 NOTE — Progress Notes (Signed)
Diabetes:  Using medications without difficulties: some occ GI upset with metformin, more when she started med but not not now.   Hypoglycemic episodes: no Hyperglycemic episodes: no Feet problems:no Blood Sugars averaging: 180s at home, occ lower.  eye exam within last year: 11/30/2018 A1c up to 9.  D/w pt.    She ran out of vitamin D but will restart.  Rare tramadol use.  Used when needed with relief.  She has some left maxillary sinus pain.  Going on about 1 month.  No fevers.  Noted more at night.  Flu shot done at office visit.  Burning with urination occasionally noted.  No fevers.  No abdominal pain.  Patient reports that she had some rectal bleeding but had difficulty getting an appointment with the GI clinic.  I told her I would check on this.  She is not having symptoms currently.  Meds, vitals, and allergies reviewed.   ROS: Per HPI unless specifically indicated in ROS section   GEN: nad, alert and oriented HEENT: mucous membranes moist, TM wnl, nasal exam slightly irritated.  No bleeding. NECK: supple w/o LA CV: rrr. PULM: ctab, no inc wob ABD: soft, +bs, not tender to palpation. EXT: no edema SKIN: Well-perfused

## 2019-03-20 NOTE — Patient Instructions (Addendum)
Use nasal saline several times daily and flonase once a day.  Price check [place].  If affordable, then start taking 1/2 tab a day.  That would be 25mg  a day.  In not affordable, then let me know.  Continue metformin.  Recheck in about 3-4 months with A1c at the visit.   We'll contact you with your lab report. Take care.  Glad to see you.

## 2019-03-22 ENCOUNTER — Telehealth: Payer: Self-pay | Admitting: Family Medicine

## 2019-03-22 ENCOUNTER — Telehealth: Payer: Self-pay

## 2019-03-22 DIAGNOSIS — J3489 Other specified disorders of nose and nasal sinuses: Secondary | ICD-10-CM | POA: Insufficient documentation

## 2019-03-22 LAB — URINE CULTURE
MICRO NUMBER:: 959407
SPECIMEN QUALITY:: ADEQUATE

## 2019-03-22 NOTE — Telephone Encounter (Signed)
Patient returned call about her lab results.

## 2019-03-22 NOTE — Assessment & Plan Note (Signed)
I want her to price check januvia.  If affordable, then start taking 1/2 tab a day.  That would be 25mg  a day.  In not affordable, then she will let me know.  Continue metformin.  Recheck in about 3-4 months with A1c at the visit.   >25 minutes spent in face to face time with patient, >50% spent in counselling or coordination of care.

## 2019-03-22 NOTE — Telephone Encounter (Signed)
-----   Message from Irene Shipper, MD sent at 03/22/2019  9:39 AM EDT ----- [name] am not sure either.  I do not see any documented correspondence from the patient in this regard (and it would be unusual that we would not offer an appointment for rectal bleeding).  In any event, I will asked my nurse to offer her an appointment for a formal evaluation.  I was glad to see that her recent hemoglobin was normal and stable.  I appreciate your reaching out.JohnLinda,Please arrange an office evaluation with myself or advanced practitioner for this patient regarding a history of transient rectal bleeding.  ThanksJP ----- Message ----- From: Tonia Ghent, MD Sent: 03/22/2019  12:55 AM EDT To: Irene Shipper, MD  I do not know what happened here.  The patient reported that she had some rectal bleeding and called your clinic to get an appointment but was not able to get scheduled.  She is not having any symptoms now.  I did know what you thought would be best at this point.  Many thanks for your input.  Brigitte Pulse

## 2019-03-22 NOTE — Telephone Encounter (Signed)
I spoke with pt and informed her of lab results.

## 2019-03-22 NOTE — Assessment & Plan Note (Signed)
Hold on antibiotics at this point.  Check urine culture.  Urinalysis discussed with patient.  Benign abdominal exam.

## 2019-03-22 NOTE — Telephone Encounter (Signed)
Pt scheduled to see Dr. Henrene Pastor 03/27/19@3 :40pm. Left message for pt to call back.

## 2019-03-22 NOTE — Assessment & Plan Note (Signed)
Hold on antibiotic treatment at this point.  Use Flonase and nasal saline.  Update me as needed.

## 2019-03-23 ENCOUNTER — Encounter: Payer: Self-pay | Admitting: Family Medicine

## 2019-03-25 ENCOUNTER — Encounter: Payer: Self-pay | Admitting: Family Medicine

## 2019-03-26 NOTE — Telephone Encounter (Signed)
Left message for pt regarding appt.

## 2019-03-27 ENCOUNTER — Ambulatory Visit: Payer: Medicare Other | Admitting: Internal Medicine

## 2019-03-28 ENCOUNTER — Telehealth: Payer: Self-pay

## 2019-03-28 NOTE — Telephone Encounter (Signed)
Noted.  I appreciate the help of all involved.  Please check on the status of this next week to see if patient will be able to start [place].  I would not change her medications in the meantime otherwise.  Thanks.

## 2019-03-28 NOTE — Telephone Encounter (Signed)
Copied from [place] [id number]. Topic: Referral - Status >> Mar 28, 2019 [id number] AM Simone Curia D wrote: [id number] Left message on voicemail to return my call regarding patient assistance program application for Januvia. Ambrose Mantle [phone]

## 2019-03-28 NOTE — Telephone Encounter (Signed)
Copied from [place] [id number]. Topic: Referral - Status >> Mar 28, 2019 [id number] AM Simone Curia D wrote: [id number] Spoke with patient to complete the patient portion of the Januvia PAP.  The patient request that the application be mailed to her to sign.  Emailed application to [employer].  Ambrose Mantle [phone]

## 2019-03-30 NOTE — Telephone Encounter (Signed)
Dorna Leitz, CMA  You 21 hours ago (9:14 AM)   Since I am no longer working with you, would you like me to e-mail this patient's application to who they have covering [place]?    Routing comment

## 2019-03-30 NOTE — Telephone Encounter (Signed)
Please.  Thanks.   

## 2019-03-30 NOTE — Telephone Encounter (Signed)
I am working with [name] on this now.

## 2019-03-30 NOTE — Telephone Encounter (Signed)
Spoke with Dr Damita Dunnings and [name]. Mailing first page to [name] to sign and asked [name] to mail it back or have someone bring it back to [place] after she signs so Dr Damita Dunnings can complete second page and submit application. [name] verbalized understanding. I am making a copy of the first page to keep on file just in case. Placing form in Dr Josefine Class inbox for review.

## 2019-04-01 NOTE — Telephone Encounter (Signed)
Please attach a current med and allergy list to the form that I filled out.  As soon as she sends back a signed copy of the first page then please add that to my completed second page.  Submit at that point.  Please scan the completed second page.  Thanks.

## 2019-04-05 ENCOUNTER — Telehealth: Payer: Self-pay | Admitting: Family Medicine

## 2019-04-05 NOTE — Telephone Encounter (Signed)
Left detailed message on voicemail to return call on all 3 numbers provided.

## 2019-04-05 NOTE — Telephone Encounter (Signed)
Please call her daughter, listed as DPR.  Check with her about the patient's anxiety and memory.  Please talk to me about this.  Thanks.

## 2019-04-10 ENCOUNTER — Encounter: Payer: Self-pay | Admitting: Family Medicine

## 2019-04-10 NOTE — Telephone Encounter (Signed)
Signed form received.  Copy of meds list and allergy list attached and faxed to [employer].  Scanned.

## 2019-04-12 NOTE — Telephone Encounter (Signed)
Left detailed message on voicemail and mailed a letter to the patient's home.

## 2019-04-13 NOTE — Telephone Encounter (Signed)
Scheduled appointment 11/19 I put this as a 30 min appointment

## 2019-04-13 NOTE — Telephone Encounter (Signed)
Daughter Otis Peak)  returned your call best number  [phone]

## 2019-04-16 ENCOUNTER — Other Ambulatory Visit: Payer: Self-pay | Admitting: Family Medicine

## 2019-04-16 NOTE — Telephone Encounter (Signed)
Electronic refill request. Lorazepam Last office visit:   03/20/2019 Last Filled:    150 tablet 2 01/17/2019  Please advise.

## 2019-04-16 NOTE — Telephone Encounter (Signed)
Sent. Thanks.  She has f/u pending.

## 2019-04-25 ENCOUNTER — Encounter: Payer: Self-pay | Admitting: Family Medicine

## 2019-04-26 NOTE — Telephone Encounter (Signed)
Jerene Pitch took a call from her on Tuesday about this.  This form cannot be faxed and was mailed but [employer] says they didn't have it.  I will check with Jerene Pitch to see if it was re-mailed.

## 2019-04-27 NOTE — Telephone Encounter (Signed)
Patient is calling back in regards to this letter she was mailing to our office. She stated she hasnt heard from anyone to see if our office received a letter from her. ??

## 2019-04-27 NOTE — Telephone Encounter (Signed)
[name] was assured that we did receive the form that she signed and it was mailed to the company for patient assistance but we have not received any kind of a ruling on her assistance.  [name] re-mailed it earlier this week because the company said they didn't have the application.

## 2019-05-02 ENCOUNTER — Other Ambulatory Visit: Payer: Self-pay | Admitting: Family Medicine

## 2019-05-03 ENCOUNTER — Ambulatory Visit: Payer: Medicare Other | Admitting: Family Medicine

## 2019-05-03 ENCOUNTER — Telehealth: Payer: Self-pay | Admitting: *Deleted

## 2019-05-03 NOTE — Telephone Encounter (Signed)
Patient and/or daughter canceled appt that was originally scheduled 05/03/2019 at your request concerning her memory.  Appt was not rescheduled.

## 2019-05-07 ENCOUNTER — Ambulatory Visit: Payer: Medicare Other | Admitting: Internal Medicine

## 2019-05-23 ENCOUNTER — Other Ambulatory Visit: Payer: Self-pay | Admitting: Family Medicine

## 2019-05-23 NOTE — Telephone Encounter (Signed)
rx sent but needs 18min OV with family present re:  anxiety and memory.  Okay to do by phone if needed.  Thanks .

## 2019-05-23 NOTE — Telephone Encounter (Signed)
Last office visit 03/20/2019 for DM.  Last refilled 04/16/2019 for #150 with no refills.  No future appointments.

## 2019-05-24 NOTE — Telephone Encounter (Signed)
That appt was scheduled on 05/03/2019 but cancelled with no rescheduling.  I left a detailed message on "[name]'s" DPR  VM again .

## 2019-05-24 NOTE — Telephone Encounter (Signed)
Thanks. Noted.

## 2019-05-30 DIAGNOSIS — H0014 Chalazion left upper eyelid: Secondary | ICD-10-CM | POA: Diagnosis not present

## 2019-06-09 ENCOUNTER — Other Ambulatory Visit: Payer: Self-pay | Admitting: Family Medicine

## 2019-06-23 ENCOUNTER — Telehealth: Payer: Self-pay | Admitting: Family Medicine

## 2019-06-25 NOTE — Telephone Encounter (Signed)
Last office visit 03/20/2019 for DM.  Last refilled 05/23/2019 for #150 with no refills.  No future appointments.

## 2019-06-26 NOTE — Telephone Encounter (Signed)
Patient scheduled.

## 2019-06-26 NOTE — Telephone Encounter (Signed)
She needs a 30-minute visit follow-up scheduled about her memory.  Please call her daughter if needed.  Prescription sent.

## 2019-06-27 ENCOUNTER — Encounter: Payer: Self-pay | Admitting: Family Medicine

## 2019-07-01 ENCOUNTER — Other Ambulatory Visit: Payer: Self-pay | Admitting: Family Medicine

## 2019-07-02 ENCOUNTER — Ambulatory Visit (INDEPENDENT_AMBULATORY_CARE_PROVIDER_SITE_OTHER): Payer: Medicare Other | Admitting: Family Medicine

## 2019-07-02 ENCOUNTER — Encounter: Payer: Self-pay | Admitting: Family Medicine

## 2019-07-02 ENCOUNTER — Other Ambulatory Visit: Payer: Self-pay

## 2019-07-02 VITALS — BP 160/70 | HR 58 | Temp 97.5°F | Ht 60.05 in | Wt 135.4 lb

## 2019-07-02 DIAGNOSIS — R413 Other amnesia: Secondary | ICD-10-CM | POA: Diagnosis not present

## 2019-07-02 DIAGNOSIS — E119 Type 2 diabetes mellitus without complications: Secondary | ICD-10-CM

## 2019-07-02 MED ORDER — BUPROPION HCL ER (SR) 100 MG PO TB12: 100.0000 mg | ORAL_TABLET | Freq: Every day | ORAL | 3 refills | Status: DC

## 2019-07-02 MED ORDER — OMEPRAZOLE 20 MG PO CPDR: DELAYED_RELEASE_CAPSULE | ORAL | 3 refills | Status: DC

## 2019-07-02 NOTE — Patient Instructions (Addendum)
Check [email or web address]   Don't change your meds for now.  Go to the lab on the way out.   If you have mychart we'll likely use that to update you.   Take care.  Glad to see you.

## 2019-07-02 NOTE — Telephone Encounter (Signed)
Pt has OV today. Will address refill request

## 2019-07-02 NOTE — Progress Notes (Signed)
This visit occurred during the SARS-CoV-2 public health emergency.  Safety protocols were in place, including screening questions prior to the visit, additional usage of staff PPE, and extensive cleaning of exam room while observing appropriate contact time as indicated for disinfecting solutions.  Memory follow up.  She had 1 episode driving where she traveling to an unfamiliar territory and made a wrong turn.  Other that she is not getting lost.  She is noted some occasional short-term memory lapses, were "I have to start over" with a household task.  We talked about testing her memory today.  See below.  Here today with her son.  Diabetes:  Using medications without difficulties: taking 1 metformin BID.   Hypoglycemic episodes: no sx Hyperglycemic episodes: no Feet problems: no Blood Sugars averaging: usually <200  She couldn't get [place].  It was too expensive.    She has seen the eye clinic about a lesion on he L upper eyelid, externally.  Was told it was a chalazion.  Recently treated with topical abx and improving.  Not ttp now, prev was tender.    PMH and SH reviewed  Meds, vitals, and allergies reviewed.   ROS: Per HPI unless specifically indicated in ROS section   GEN: nad, alert and oriented HEENT: ncat, chalazion noted on the left upper eyelid. NECK: supple w/o LA CV: rrr. PULM: ctab, no inc wob ABD: soft, +bs EXT: no edema SKIN: no acute rash MMSE 27/30.  -2 for switching to letters when spelling world backwards.  -1 for recall.  Diabetic foot exam: Normal inspection No skin breakdown No calluses  Normal DP pulses Normal sensation to light touch and monofilament Nails normal

## 2019-07-03 LAB — CBC WITH DIFFERENTIAL/PLATELET
Basophils Absolute: 0.1 10*3/uL (ref 0.0–0.1)
Basophils Relative: 1.2 % (ref 0.0–3.0)
Eosinophils Absolute: 0.1 10*3/uL (ref 0.0–0.7)
Eosinophils Relative: 1.5 % (ref 0.0–5.0)
HCT: 41 % (ref 36.0–46.0)
Hemoglobin: 13.8 g/dL (ref 12.0–15.0)
Lymphocytes Relative: 19.7 % (ref 12.0–46.0)
Lymphs Abs: 1.8 10*3/uL (ref 0.7–4.0)
MCHC: 33.6 g/dL (ref 30.0–36.0)
MCV: 89.6 fl (ref 78.0–100.0)
Monocytes Absolute: 0.5 10*3/uL (ref 0.1–1.0)
Monocytes Relative: 5.8 % (ref 3.0–12.0)
Neutro Abs: 6.6 10*3/uL (ref 1.4–7.7)
Neutrophils Relative %: 71.8 % (ref 43.0–77.0)
Platelets: 255 10*3/uL (ref 150.0–400.0)
RBC: 4.57 Mil/uL (ref 3.87–5.11)
RDW: 13.6 % (ref 11.5–15.5)
WBC: 9.2 10*3/uL (ref 4.0–10.5)

## 2019-07-03 LAB — COMPREHENSIVE METABOLIC PANEL
ALT: 13 U/L (ref 0–35)
AST: 17 U/L (ref 0–37)
Albumin: 4.1 g/dL (ref 3.5–5.2)
Alkaline Phosphatase: 54 U/L (ref 39–117)
BUN: 12 mg/dL (ref 6–23)
CO2: 29 mEq/L (ref 19–32)
Calcium: 9.8 mg/dL (ref 8.4–10.5)
Chloride: 98 mEq/L (ref 96–112)
Creatinine, Ser: 0.62 mg/dL (ref 0.40–1.20)
GFR: 92.53 mL/min (ref >60.00)
Glucose, Bld: 167 mg/dL — ABNORMAL HIGH (ref 70–99)
Potassium: 4.2 mEq/L (ref 3.5–5.1)
Sodium: 135 mEq/L (ref 135–145)
Total Bilirubin: 0.5 mg/dL (ref 0.2–1.2)
Total Protein: 7.3 g/dL (ref 6.0–8.3)

## 2019-07-03 LAB — VITAMIN B12: Vitamin B-12: 120 pg/mL — ABNORMAL LOW (ref 211–911)

## 2019-07-03 LAB — TSH: TSH: 1.37 u[IU]/mL (ref 0.35–4.50)

## 2019-07-03 LAB — HEMOGLOBIN A1C: Hgb A1c MFr Bld: 8.6 % — ABNORMAL HIGH (ref 4.6–6.5)

## 2019-07-03 NOTE — Telephone Encounter (Signed)
Refill done at office visit.

## 2019-07-05 DIAGNOSIS — H25813 Combined forms of age-related cataract, bilateral: Secondary | ICD-10-CM | POA: Diagnosis not present

## 2019-07-05 DIAGNOSIS — H0014 Chalazion left upper eyelid: Secondary | ICD-10-CM | POA: Diagnosis not present

## 2019-07-06 ENCOUNTER — Other Ambulatory Visit: Payer: Self-pay | Admitting: Family Medicine

## 2019-07-06 DIAGNOSIS — R413 Other amnesia: Secondary | ICD-10-CM | POA: Insufficient documentation

## 2019-07-06 DIAGNOSIS — E119 Type 2 diabetes mellitus without complications: Secondary | ICD-10-CM

## 2019-07-06 DIAGNOSIS — E538 Deficiency of other specified B group vitamins: Secondary | ICD-10-CM | POA: Insufficient documentation

## 2019-07-06 MED ORDER — METFORMIN HCL 500 MG PO TABS: ORAL_TABLET | ORAL | Status: DC

## 2019-07-06 MED ORDER — CYANOCOBALAMIN 1000 MCG/ML IJ SOLN: INTRAMUSCULAR | 0 refills | Status: DC

## 2019-07-06 NOTE — Assessment & Plan Note (Signed)
Her MMSE is 27/30.  -2 for switching to letters when spelling world backwards.  -1 for recall.  Her score is not perfect but it is not severely low.  Discussed.  Routine cautions given.  She is only driving locally now.  We talked about checking her labs regarding memory loss to look for reversible causes.  No change in medications at this point.

## 2019-07-06 NOTE — Assessment & Plan Note (Signed)
She could not afford Januvia.  It was too expensive.  See notes on labs.  She is taking 1 Metformin twice a day.

## 2019-07-08 ENCOUNTER — Other Ambulatory Visit: Payer: Self-pay | Admitting: Family Medicine

## 2019-07-08 MED ORDER — CYANOCOBALAMIN 1000 MCG/ML IJ SOLN: INTRAMUSCULAR | 3 refills | Status: DC

## 2019-07-08 MED ORDER — "BD INSULIN SYRINGE 25G X 1"" 1 ML MISC": 1 refills | Status: DC

## 2019-07-21 ENCOUNTER — Other Ambulatory Visit: Payer: Self-pay | Admitting: Family Medicine

## 2019-07-23 ENCOUNTER — Other Ambulatory Visit: Payer: Self-pay | Admitting: Family Medicine

## 2019-07-23 NOTE — Telephone Encounter (Signed)
Electronic refill request. Lorazepam Last office visit:   07/02/2019 Last Filled:    150 tablet 0 06/26/2019  Please advise.

## 2019-07-24 ENCOUNTER — Other Ambulatory Visit: Payer: Self-pay | Admitting: *Deleted

## 2019-07-24 NOTE — Telephone Encounter (Signed)
Sent. Thanks.   

## 2019-07-27 DIAGNOSIS — D485 Neoplasm of uncertain behavior of skin: Secondary | ICD-10-CM | POA: Diagnosis not present

## 2019-07-27 DIAGNOSIS — H0014 Chalazion left upper eyelid: Secondary | ICD-10-CM | POA: Diagnosis not present

## 2019-08-11 ENCOUNTER — Other Ambulatory Visit: Payer: Self-pay | Admitting: Family Medicine

## 2019-09-04 ENCOUNTER — Other Ambulatory Visit: Payer: Self-pay | Admitting: Family Medicine

## 2019-09-04 NOTE — Telephone Encounter (Signed)
Electronic refill request. Cyclobenzaprine Last office visit:  07/02/2019 Last Filled:    #30  1 RF  10/19/2018 Please advise.

## 2019-09-05 NOTE — Telephone Encounter (Signed)
Sent. Thanks.   

## 2019-09-19 ENCOUNTER — Other Ambulatory Visit: Payer: Self-pay | Admitting: Family Medicine

## 2019-09-19 NOTE — Telephone Encounter (Signed)
Sent. Thanks.   

## 2019-09-19 NOTE — Telephone Encounter (Signed)
Electronic refill request Lorazepam Last refill 07/24/19 #150/1 Last office visit 07/02/19

## 2019-10-05 ENCOUNTER — Other Ambulatory Visit: Payer: Self-pay | Admitting: Family Medicine

## 2019-10-18 ENCOUNTER — Ambulatory Visit: Payer: Medicare Other | Admitting: Family Medicine

## 2019-10-26 ENCOUNTER — Ambulatory Visit: Payer: Medicare Other | Admitting: Family Medicine

## 2019-10-29 ENCOUNTER — Ambulatory Visit: Payer: Medicare Other | Admitting: Family Medicine

## 2019-11-06 ENCOUNTER — Ambulatory Visit: Payer: Medicare Other | Admitting: Family Medicine

## 2019-11-14 ENCOUNTER — Other Ambulatory Visit: Payer: Self-pay | Admitting: Family Medicine

## 2019-11-15 NOTE — Telephone Encounter (Signed)
Last office visit 07/02/2019 for DM & Memory Change.  Last refilled 09/19/2019 for #150 with 1 refill.  No future appointments.

## 2019-11-16 NOTE — Telephone Encounter (Signed)
Sent. Thanks.   

## 2019-11-29 ENCOUNTER — Other Ambulatory Visit: Payer: Self-pay | Admitting: Family Medicine

## 2019-12-19 NOTE — Addendum Note (Signed)
Addended by: Randall An on: 12/19/2019 05:15 PM   Modules accepted: Orders

## 2019-12-19 NOTE — Telephone Encounter (Signed)
Changed pharmacy and pended medication refill.

## 2019-12-19 NOTE — Telephone Encounter (Signed)
Patient called. Patient said rx was sent to [facility] and she won't be in [place].  Patient's requesting rx be sent to [facility].  Patient said [facility] wouldn't transfer the rx to them.

## 2019-12-20 ENCOUNTER — Other Ambulatory Visit: Payer: Self-pay | Admitting: Family Medicine

## 2019-12-20 MED ORDER — LORAZEPAM 2 MG PO TABS: ORAL_TABLET | ORAL | 1 refills | Status: DC

## 2019-12-20 NOTE — Telephone Encounter (Signed)
This request has already been sent to Dr. Damita Dunnings.

## 2019-12-20 NOTE — Telephone Encounter (Signed)
Patient called requesting a her script be sent to a new pharmacy   She stated she is needing the LORazepam (ATIVAN) 2 MG tablet  Patient has 2 left      [address], [place], [place] [address]

## 2019-12-20 NOTE — Telephone Encounter (Signed)
Sent. Thanks.   

## 2019-12-20 NOTE — Addendum Note (Signed)
Addended by: Tonia Ghent on: 12/20/2019 03:44 PM   Modules accepted: Orders

## 2020-01-10 ENCOUNTER — Telehealth: Payer: Self-pay | Admitting: Family Medicine

## 2020-01-10 NOTE — Telephone Encounter (Signed)
Vincent @ [facility] called Needing up dated rx For metformin Pt states dr told her to take 2 tablet in am and 1 tablet pm [facility] [address] [phone]

## 2020-01-11 MED ORDER — METFORMIN HCL 500 MG PO TABS: ORAL_TABLET | ORAL | 1 refills | Status: DC

## 2020-01-11 NOTE — Telephone Encounter (Signed)
Sent. Thanks.  Needs DM2 f/u when possible.

## 2020-01-11 NOTE — Telephone Encounter (Signed)
Appointment scheduled.

## 2020-01-15 ENCOUNTER — Encounter: Payer: Self-pay | Admitting: Family Medicine

## 2020-01-15 ENCOUNTER — Ambulatory Visit (INDEPENDENT_AMBULATORY_CARE_PROVIDER_SITE_OTHER): Payer: Medicare Other | Admitting: Family Medicine

## 2020-01-15 ENCOUNTER — Ambulatory Visit (INDEPENDENT_AMBULATORY_CARE_PROVIDER_SITE_OTHER)
Admission: RE | Admit: 2020-01-15 | Discharge: 2020-01-15 | Disposition: A | Discharge status: Home / Self Care | Payer: Medicare Other | Source: Ambulatory Visit | Attending: Family Medicine | Admitting: Family Medicine

## 2020-01-15 ENCOUNTER — Other Ambulatory Visit: Payer: Self-pay

## 2020-01-15 VITALS — BP 130/64 | HR 58 | Temp 96.5°F | Ht 60.05 in | Wt 130.4 lb

## 2020-01-15 DIAGNOSIS — E119 Type 2 diabetes mellitus without complications: Secondary | ICD-10-CM | POA: Diagnosis not present

## 2020-01-15 DIAGNOSIS — E538 Deficiency of other specified B group vitamins: Secondary | ICD-10-CM | POA: Diagnosis not present

## 2020-01-15 DIAGNOSIS — M549 Dorsalgia, unspecified: Secondary | ICD-10-CM

## 2020-01-15 DIAGNOSIS — M4184 Other forms of scoliosis, thoracic region: Secondary | ICD-10-CM | POA: Diagnosis not present

## 2020-01-15 DIAGNOSIS — F411 Generalized anxiety disorder: Secondary | ICD-10-CM

## 2020-01-15 MED ORDER — LORAZEPAM 2 MG PO TABS: ORAL_TABLET | ORAL | 3 refills | Status: DC

## 2020-01-15 MED ORDER — CYCLOBENZAPRINE HCL 10 MG PO TABS: ORAL_TABLET | ORAL | 3 refills | Status: DC

## 2020-01-15 NOTE — Patient Instructions (Signed)
Call about an eye exam when possible.   Go to the lab on the way out.   If you have mychart we'll likely use that to update you.    Take care.  Glad to see you.

## 2020-01-15 NOTE — Progress Notes (Signed)
This visit occurred during the SARS-CoV-2 public health emergency.  Safety protocols were in place, including screening questions prior to the visit, additional usage of staff PPE, and extensive cleaning of exam room while observing appropriate contact time as indicated for disinfecting solutions.  Diabetes:  Using medications without difficulties: yes Hypoglycemic episodes: no Hyperglycemic episodes: no Feet problems: no Blood Sugars averaging: not checked often, was recently 180s.   eye exam within last year: due, d/w pt.   A1c pending.  Still on metformin, 3 tabs a day.    Sedation caution on flexeril, d/w pt.  Used prn for back pain with some relief, heat also helps.  No ADE on med. She has upper back pain in the midline.  B12 level pending.  Most recent B12 shot was 01/07/20.  Compliant.  She is using BZD prn, d/w pt.  No ADE on med.  Still on SSRI and wellbutrin.  She is living with her daughter, but her daughter is selling her house.  She is considering options about moving, etc.  Meds, vitals, and allergies reviewed.   ROS: Per HPI unless specifically indicated in ROS section   GEN: nad, alert and oriented HEENT: ncat NECK: supple w/o LA CV: rrr. PULM: ctab, no inc wob ABD: soft, +bs EXT: no edema SKIN: no acute rash  Diabetic foot exam: Normal inspection No skin breakdown No calluses  Normal DP pulses Normal sensation to light touch and monofilament Nails normal  At least 30 minutes were devoted to patient care in this encounter (this can potentially include time spent reviewing the patient's file/history, interviewing and examining the patient, counseling/reviewing plan with patient, ordering referrals, ordering tests, reviewing relevant laboratory or x-ray data, and documenting the encounter).

## 2020-01-16 LAB — HEMOGLOBIN A1C: Hgb A1c MFr Bld: 7.9 % — ABNORMAL HIGH (ref 4.6–6.5)

## 2020-01-16 LAB — VITAMIN B12: Vitamin B-12: 297 pg/mL (ref 211–911)

## 2020-01-16 NOTE — Assessment & Plan Note (Signed)
No change in meds at this point.  See notes on follow-up labs.  Continue Metformin 3 tabs a day.

## 2020-01-16 NOTE — Assessment & Plan Note (Signed)
See notes on labs. 

## 2020-01-16 NOTE — Assessment & Plan Note (Signed)
Continue as needed Flexeril with sedation caution.  She does not use lorazepam when she takes Flexeril.

## 2020-01-16 NOTE — Assessment & Plan Note (Signed)
She is using BZD prn, d/w pt.  No ADE on med.  Still on SSRI and wellbutrin.  She is living with her daughter, but her daughter is selling her house.  She is considering options about moving, etc. no change in medications at this point.

## 2020-01-25 ENCOUNTER — Other Ambulatory Visit: Payer: Self-pay | Admitting: Family Medicine

## 2020-03-05 ENCOUNTER — Other Ambulatory Visit: Payer: Self-pay | Admitting: Family Medicine

## 2020-03-05 DIAGNOSIS — H40033 Anatomical narrow angle, bilateral: Secondary | ICD-10-CM | POA: Diagnosis not present

## 2020-03-05 DIAGNOSIS — H2513 Age-related nuclear cataract, bilateral: Secondary | ICD-10-CM | POA: Diagnosis not present

## 2020-03-05 LAB — HM DIABETES EYE EXAM

## 2020-04-08 ENCOUNTER — Other Ambulatory Visit: Payer: Self-pay | Admitting: Family Medicine

## 2020-04-08 DIAGNOSIS — Z1231 Encounter for screening mammogram for malignant neoplasm of breast: Secondary | ICD-10-CM

## 2020-04-09 ENCOUNTER — Ambulatory Visit: Payer: Medicare Other

## 2020-04-11 ENCOUNTER — Other Ambulatory Visit: Payer: Self-pay

## 2020-04-11 ENCOUNTER — Encounter: Payer: Self-pay | Admitting: Family Medicine

## 2020-04-11 ENCOUNTER — Telehealth (INDEPENDENT_AMBULATORY_CARE_PROVIDER_SITE_OTHER): Payer: Medicare Other | Admitting: Family Medicine

## 2020-04-11 DIAGNOSIS — R197 Diarrhea, unspecified: Secondary | ICD-10-CM | POA: Diagnosis not present

## 2020-04-11 DIAGNOSIS — F411 Generalized anxiety disorder: Secondary | ICD-10-CM

## 2020-04-11 MED ORDER — METFORMIN HCL 500 MG PO TABS: ORAL_TABLET | ORAL | Status: DC

## 2020-04-11 MED ORDER — BUPROPION HCL ER (SR) 100 MG PO TB12: 100.0000 mg | ORAL_TABLET | Freq: Every day | ORAL | 1 refills | Status: DC

## 2020-04-11 MED ORDER — SERTRALINE HCL 100 MG PO TABS: 150.0000 mg | ORAL_TABLET | Freq: Every day | ORAL | 1 refills | Status: DC

## 2020-04-11 NOTE — Progress Notes (Signed)
Interactive audio and video telecommunications were attempted between this provider and patient, however failed, due to patient having technical difficulties OR patient did not have access to video capability.  We continued and completed visit with audio only.   Virtual Visit via Telephone Note  I connected with patient on 04/11/20  at 8:37 AM  by telephone and verified that I am speaking with the correct person using two identifiers.  Location of patient: home.   Location of MD: [facility] Name of referring provider (if blank then none associated): Names per persons and role in encounter:  MD: Earlyne Iba, Patient: name listed above.    I discussed the limitations, risks, security and privacy concerns of performing an evaluation and management service by telephone and the availability of in person appointments. I also discussed with the patient that there may be a patient responsible charge related to this service. The patient expressed understanding and agreed to proceed.  CC: her son's house.    History of Present Illness:   Taking 2 metformin in the AM and 1 at night.  She has diarrhea every few days.  Some occ constipation followed by diarrhea.  Going alternating for 3-4 weeks.  No abd pain.  No vomiting except for 1 episode of vomiting two nights ago.  No temps >100 except for 100.3 last night.  Fever resolved in the meantime.  Sugar has been ~150, <200.  No dysuria.    Her oldest grandson committed suicide two nights ago.  She is at her son's house now.  Another grandson had prev committed suicide.  D/w pt.  This has been exceeding difficult for patient.  She is tearful.  She isn't sleeping.  We talked about grief counseling and I gave contact info for [facility] in [place], d/w pt.  She'll consider.  No SI/HI.     Observations/Objective: nad but she is tearful, regains composure.    Assessment and Plan:  Grief.  Inc sertraline to 150mg  a day.  Continue lorazepam and  wellbutrin.  Info given re: grief counseling.    Diarrhea.  Cut metformin back to 1 pill BID for now.  Routine cautions d/w pt.  She'll call back if persistent fevers.  She'll update me in a few days, sooner if needed and we'll see about making f/u plans at that point.   Follow Up Instructions: see above.    I discussed the assessment and treatment plan with the patient. The patient was provided an opportunity to ask questions and all were answered. The patient agreed with the plan and demonstrated an understanding of the instructions.   The patient was advised to call back or seek an in-person evaluation if the symptoms worsen or if the condition fails to improve as anticipated.  I provided 25 minutes of non-face-to-face time during this encounter.  Elsie Stain, MD

## 2020-04-13 DIAGNOSIS — R197 Diarrhea, unspecified: Secondary | ICD-10-CM | POA: Insufficient documentation

## 2020-04-13 NOTE — Assessment & Plan Note (Signed)
And grief.  Inc sertraline to 150mg  a day.  Continue lorazepam and wellbutrin.  Info given re: grief counseling.  Condolences offered.  She will update me as needed.  She has family support.  No suicidal homicidal intent.

## 2020-04-13 NOTE — Assessment & Plan Note (Signed)
Abdominal symptoms clearly could be exacerbated by recent events with her grandson.  She could have symptoms related to Metformin.  Plan to cut metformin back to 1 pill BID for now.  Routine cautions d/w pt.  She'll call back if persistent fevers.  She'll update me in a few days, sooner if needed and we'll see about making f/u plans at that point.  Routine cautions given to patient.  She agrees with plan.

## 2020-04-15 ENCOUNTER — Other Ambulatory Visit: Payer: Self-pay | Admitting: Family Medicine

## 2020-04-15 ENCOUNTER — Ambulatory Visit: Payer: Medicare Other | Admitting: Family Medicine

## 2020-04-24 ENCOUNTER — Telehealth: Payer: Self-pay | Admitting: Family Medicine

## 2020-04-24 NOTE — Progress Notes (Signed)
  Chronic Care Management   Outreach Note  04/24/2020 Name: [name] MRN: [id number] DOB: 28-Feb-1941  Referred by: Tonia Ghent, MD Reason for referral : Chronic Care Management   An unsuccessful telephone outreach was attempted today. The patient was referred to the pharmacist for assistance with care management and care coordination.   Follow Up Plan:   [name]  Upstream Scheduler

## 2020-05-03 ENCOUNTER — Other Ambulatory Visit: Payer: Self-pay | Admitting: Family Medicine

## 2020-05-05 NOTE — Telephone Encounter (Signed)
Pharmacy requests refill on: Metformin HCL 500 mg  LAST REFILL: 01/26/2020 LAST OV: 04/11/2020 NEXT OV: Not Scheduled  PHARMACY: [id number] [place], [place]   HgbA1C (01/15/2020): 7.9

## 2020-05-13 ENCOUNTER — Other Ambulatory Visit: Payer: Self-pay | Admitting: Family Medicine

## 2020-05-13 NOTE — Telephone Encounter (Signed)
Please Advise

## 2020-05-14 NOTE — Telephone Encounter (Signed)
Sent. Thanks.   

## 2020-05-24 ENCOUNTER — Other Ambulatory Visit: Payer: Self-pay | Admitting: Family Medicine

## 2020-06-03 ENCOUNTER — Telehealth: Payer: Self-pay | Admitting: Family Medicine

## 2020-06-03 MED ORDER — LORAZEPAM 2 MG PO TABS: ORAL_TABLET | ORAL | 0 refills | Status: DC

## 2020-06-03 NOTE — Telephone Encounter (Signed)
Patient called in stating she is needing the Lorazepam to be sent to [facility] on [address] in [place] with phone number [phone]. Please advise as she has moved.

## 2020-06-03 NOTE — Telephone Encounter (Signed)
Sent. Thanks.   

## 2020-06-09 ENCOUNTER — Telehealth: Payer: Self-pay | Admitting: Family Medicine

## 2020-06-09 NOTE — Chronic Care Management (AMB) (Signed)
  Chronic Care Management   Note  06/09/2020 Name: [name] MRN: [id number] DOB: Nov 25, 1940  [name] is a 79 y.o. year old female who is a primary care patient of Joaquim Nam, MD. I reached out to Berneice Heinrich by phone today in response to a referral sent by Ms. Gae Bon Weisgerber's PCP, Joaquim Nam, MD.   [name] was given information about Chronic Care Management services today including:  1. CCM service includes personalized support from designated clinical staff supervised by her physician, including individualized plan of care and coordination with other care providers 2. 24/7 contact phone numbers for assistance for urgent and routine care needs. 3. Service will only be billed when office clinical staff spend 20 minutes or more in a month to coordinate care. 4. Only one practitioner may furnish and bill the service in a calendar month. 5. The patient may stop CCM services at any time (effective at the end of the month) by phone call to the office staff.   Patient agreed to services and verbal consent obtained.   Follow up plan:   Aggie Hacker  Upstream Scheduler

## 2020-06-12 ENCOUNTER — Ambulatory Visit: Payer: Medicare Other | Admitting: Family Medicine

## 2020-06-19 ENCOUNTER — Ambulatory Visit: Payer: Medicare Other | Admitting: Family Medicine

## 2020-07-03 ENCOUNTER — Telehealth: Payer: Self-pay

## 2020-07-03 NOTE — Chronic Care Management (AMB) (Signed)
Chronic Care Management Pharmacy Assistant   Name: [name]  MRN: [id number] DOB: 04/25/1941  Reason for Encounter: Initial Questions for CCM Visit 07/08/20   PCP : Tonia Ghent, MD  Allergies:   Allergies  Allergen Reactions  . Bisphosphonates     Unclear relation to meds, but pt had long bone fracture after treatment with fosamax  . Diclofenac Potassium     Not sure  . Naproxen Sodium     Not sure  . Sulfonamide Derivatives   . Ezetimibe-Simvastatin     myalgias  . Simvastatin     myalgias    Medications: Outpatient Encounter Medications as of 07/03/2020  Medication Sig  . ACCU-CHEK AVIVA PLUS test strip USE ONE STRIP TO CHECK GLUCOSE ONCE DAILY  . ACCU-CHEK SOFTCLIX LANCETS lancets USE ONCE DAILY AS DIRECTED.  E11.9  . amLODipine (NORVASC) 5 MG tablet Take 1 tablet by mouth once daily  . Blood Glucose Monitoring Suppl (ACCU-CHEK AVIVA PLUS) w/Device KIT Use as instructed to test blood sugar once daily or as directed.  Dx:  E11.9  Non-insulin dependent.  Marland Kitchen buPROPion (WELLBUTRIN SR) 100 MG 12 hr tablet Take 1 tablet (100 mg total) by mouth daily.  . cholecalciferol (VITAMIN D) 1000 UNITS tablet Take 1,000 Units by mouth daily.  . cyanocobalamin (,VITAMIN B-12,) 1000 MCG/ML injection INJECT 1 ML (CC) INTRAMUSCULARLY ONCE A WEEK FOR 4 DOSES THEN MONTHLY THEREAFTER  . cyclobenzaprine (FLEXERIL) 10 MG tablet TAKE 1/2 TO 1 (ONE-HALF TO ONE) TABLET BY MOUTH TWICE DAILY AS NEEDED FOR MUSCLE SPASM (SEDATION CAUTION. SKIP LORAZEPAM WHEN USED)  . fluticasone (FLONASE) 50 MCG/ACT nasal spray Place 2 sprays into both nostrils daily.  Marland Kitchen glucose blood (ACCU-CHEK AVIVA PLUS) test strip USE ONE STRIP TO CHECK GLUCOSE ONCE DAILY.  E11.9  . Insulin Syringe-Needle U-100 (B-D INSULIN SYRINGE 1CC/25GX1") 25G X 1" 1 ML MISC Use for B12 injection  . LORazepam (ATIVAN) 2 MG tablet TAKE (1) TABLET THREE TIMES DAILY AS NEEDED AND 1 OR 2 AT BEDTIME  . metFORMIN (GLUCOPHAGE) 500 MG tablet  TAKE 1 TO 2 TABLETS BY MOUTH TWICE DAILY WITH MEALS  . metoprolol tartrate (LOPRESSOR) 50 MG tablet Take 1 tablet by mouth twice daily  . Omega-3 Fatty Acids (FISH OIL) 1000 MG CAPS Take 1,000 mg by mouth 2 (two) times daily.   Marland Kitchen omeprazole (PRILOSEC) 20 MG capsule TAKE 1 CAPSULE BY MOUTH TWICE DAILY AS NEEDED  . sertraline (ZOLOFT) 100 MG tablet Take 1.5 tablets (150 mg total) by mouth daily.  . traMADol (ULTRAM) 50 MG tablet Take 1 tablet (50 mg total) by mouth every 12 (twelve) hours as needed (for pain not improved with tylenol).   No facility-administered encounter medications on file as of 07/03/2020.    Current Diagnosis: Patient Active Problem List   Diagnosis Date Noted  . Diarrhea 04/13/2020  . Memory change 07/06/2019  . B12 deficiency 07/06/2019  . Sinus pressure 03/22/2019  . Healthcare maintenance 10/16/2018  . Back pain 10/16/2018  . Community acquired pneumonia 03/29/2018  . Left knee pain 09/30/2016  . Hypertension   . Colovaginal fistula s/p LAR rectosigmoid resection & colovaginal fistula repair/omentopexy 05/14/2016 05/14/2016  . Back spasm 08/05/2015  . Dysuria 08/05/2015  . Advance care planning February 05, 2015  . Sudden death of family member 02/11/2014  . Medicare annual wellness visit, initial 10/08/2013  . Chest wall pain 02/23/2012  . Breast cancer of upper-inner quadrant of left female breast ([place]) 08/12/2011  . Iron  deficiency anemia 11/20/2010  . SHOULDER PAIN, LEFT 07/03/2010  . DM (diabetes mellitus) ([place]) 01/28/2010  . HLD (hyperlipidemia) 01/27/2010  . GERD 01/27/2010  . Osteoporosis 01/27/2010  . Insomnia 01/27/2010  . Anxiety state 01/02/2010    Have you seen any other providers since your last visit? No visits since video visit with Dr. Damita Dunnings 04/11/20.  Unable to reach patient to discuss initial questions or upcoming appointment. Left message on voicemail to remind patient to have all medications, supplements and any blood sugar and blood  pressure readings available for review with Debbora Dus, Pharm. D, at their telephone visit on 07/08/20.     Follow-Up:  Pharmacist Review   Debbora Dus, CPP notified  Margaretmary Dys, [place] Pharmacy Assistant [phone]

## 2020-07-08 ENCOUNTER — Ambulatory Visit: Payer: Medicare Other

## 2020-07-08 ENCOUNTER — Telehealth: Payer: Self-pay

## 2020-07-08 ENCOUNTER — Other Ambulatory Visit: Payer: Self-pay | Admitting: Family Medicine

## 2020-07-08 NOTE — Chronic Care Management (AMB) (Signed)
Unable to reach patient for initial CCM visit. Will contact for rescheduling.  Debbora Dus, PharmD Clinical Pharmacist [facility] at [facility] [phone]

## 2020-07-08 NOTE — Telephone Encounter (Signed)
  Chronic Care Management   Outreach Note  07/08/2020 Name: MARYFRANCES PORTUGAL MRN: [id number] DOB: May 16, 1941  Referred by: Tonia Ghent, MD  Attempted to contact patient by telephone for initial CCM visit 07/08/20 at 2 PM. Unable to leave voicemail as box is full.  Debbora Dus, PharmD Clinical Pharmacist [facility] at [facility] [phone]

## 2020-07-09 NOTE — Telephone Encounter (Signed)
Pharmacy requests refill on: Lorazepam 2 mg   LAST REFILL: 06/03/2020 (Q-150, R-0) LAST OV: 04/11/2020 NEXT OV: Not Scheduled  PHARMACY: [facility] [address] [place]

## 2020-07-09 NOTE — Telephone Encounter (Signed)
Sent. Thanks.   

## 2020-08-01 ENCOUNTER — Ambulatory Visit: Payer: Medicare Other | Admitting: Family Medicine

## 2020-08-08 ENCOUNTER — Encounter: Payer: Self-pay | Admitting: Family Medicine

## 2020-08-08 ENCOUNTER — Ambulatory Visit (INDEPENDENT_AMBULATORY_CARE_PROVIDER_SITE_OTHER): Payer: Medicare Other | Admitting: Family Medicine

## 2020-08-08 ENCOUNTER — Other Ambulatory Visit: Payer: Self-pay

## 2020-08-08 VITALS — BP 142/72 | HR 60 | Temp 98.0°F | Ht 60.0 in | Wt 128.0 lb

## 2020-08-08 DIAGNOSIS — E538 Deficiency of other specified B group vitamins: Secondary | ICD-10-CM

## 2020-08-08 DIAGNOSIS — E119 Type 2 diabetes mellitus without complications: Secondary | ICD-10-CM

## 2020-08-08 DIAGNOSIS — F411 Generalized anxiety disorder: Secondary | ICD-10-CM | POA: Diagnosis not present

## 2020-08-08 LAB — POCT GLYCOSYLATED HEMOGLOBIN (HGB A1C): Hemoglobin A1C: 7.4 % — AB (ref 4.0–5.6)

## 2020-08-08 MED ORDER — SERTRALINE HCL 50 MG PO TABS: 150.0000 mg | ORAL_TABLET | Freq: Every day | ORAL | 1 refills | Status: DC

## 2020-08-08 MED ORDER — ACCU-CHEK AVIVA PLUS W/DEVICE KIT: PACK | 0 refills | Status: DC

## 2020-08-08 MED ORDER — CYANOCOBALAMIN 1000 MCG/ML IJ SOLN: INTRAMUSCULAR | 3 refills | Status: DC

## 2020-08-08 NOTE — Progress Notes (Signed)
This visit occurred during the SARS-CoV-2 public health emergency.  Safety protocols were in place, including screening questions prior to the visit, additional usage of staff PPE, and extensive cleaning of exam room while observing appropriate contact time as indicated for disinfecting solutions.  Diabetes:  Using medications without difficulties: 2AM and 1 PM metformin.  No ADE on med.   Hypoglycemic episodes: no Hyperglycemic episodes: no Feet problems: no Blood Sugars averaging: not checked recently, meter broken, se below.   eye exam within last year: yes, 03/05/20 A1c 7.4.  D/w pt.   New Dm2 meter rx sent.    D/w pt about continuing B12 injection.   Prev with low level that improved with home IM injection.    Anxiety d/w pt.  She had been on 100mg  sertraline, not 150. D/w pt. No sedation on meds.  Discussed suicide of her grandson.  Another family member nearly died from overdose in 07/23/2020.   She has some family support but significant stressors noted.    PMH and SH reviewed  Meds, vitals, and allergies reviewed.   ROS: Per HPI unless specifically indicated in ROS section   GEN: nad, alert and oriented HEENT: ncat NECK: supple w/o LA CV: rrr. PULM: ctab, no inc wob ABD: soft, +bs EXT: no edema SKIN: well perfused.   30 minutes were devoted to patient care in this encounter (this includes time spent reviewing the patient's file/history, interviewing and examining the patient, counseling/reviewing plan with patient).

## 2020-08-08 NOTE — Patient Instructions (Addendum)
Keep going with the B12 injections as you have been.   Don't change your metformin.  Increase the sertraline to 150mg  a day.   Recheck in about 6 months.  We can do labs at the visit.   Take care.  Glad to see you.

## 2020-08-10 NOTE — Assessment & Plan Note (Signed)
Significant stressors discussed as above.  Would increase sertraline to 150 mg daily.  She will update me as needed.  Continue lorazepam otherwise along with Wellbutrin.  Not sedated on medication.

## 2020-08-10 NOTE — Assessment & Plan Note (Signed)
Would continue with B12 injection as is.  Discussed.

## 2020-08-10 NOTE — Assessment & Plan Note (Signed)
A1c improved.  Prescription sent for new meter.  No change in medication.  Continue Metformin as is.  She agrees.

## 2020-08-19 ENCOUNTER — Other Ambulatory Visit: Payer: Self-pay

## 2020-08-19 MED ORDER — METOPROLOL TARTRATE 50 MG PO TABS: 50.0000 mg | ORAL_TABLET | Freq: Two times a day (BID) | ORAL | 1 refills | Status: DC

## 2020-09-02 ENCOUNTER — Telehealth: Payer: Self-pay

## 2020-09-02 NOTE — Chronic Care Management (AMB) (Signed)
    Chronic Care Management Pharmacy Assistant   Name: [name]  MRN: [id number] DOB: 05/03/1941  Reason for Encounter: Schedule CCM appointment   Conditions to be addressed/monitored: HTN, HLD and DMII  Recent office visits:  08/08/20- Dr. Damita Dunnings- PCP - Increased Sertraline to 150 mg  Recent consult visits:  [facility] visits:  None in previous 6 months  Medications: Outpatient Encounter Medications as of 09/02/2020  Medication Sig  . ACCU-CHEK SOFTCLIX LANCETS lancets USE ONCE DAILY AS DIRECTED.  E11.9  . amLODipine (NORVASC) 5 MG tablet Take 1 tablet by mouth once daily  . Blood Glucose Monitoring Suppl (ACCU-CHEK AVIVA PLUS) w/Device KIT Use as instructed to test blood sugar once daily or as directed.  Dx:  E11.9  Non-insulin dependent.  Marland Kitchen buPROPion (WELLBUTRIN SR) 100 MG 12 hr tablet Take 1 tablet (100 mg total) by mouth daily.  . cholecalciferol (VITAMIN D) 1000 UNITS tablet Take 1,000 Units by mouth daily.  . cyanocobalamin (,VITAMIN B-12,) 1000 MCG/ML injection INJECT 1 ML  INTRAMUSCULARLY MONTHLY  . cyclobenzaprine (FLEXERIL) 10 MG tablet TAKE 1/2 TO 1 (ONE-HALF TO ONE) TABLET BY MOUTH TWICE DAILY AS NEEDED FOR MUSCLE SPASM (SEDATION CAUTION. SKIP LORAZEPAM WHEN USED)  . fluticasone (FLONASE) 50 MCG/ACT nasal spray Place 2 sprays into both nostrils daily.  Marland Kitchen glucose blood (ACCU-CHEK AVIVA PLUS) test strip USE ONE STRIP TO CHECK GLUCOSE ONCE DAILY.  E11.9  . Insulin Syringe-Needle U-100 (B-D INSULIN SYRINGE 1CC/25GX1") 25G X 1" 1 ML MISC Use for B12 injection  . LORazepam (ATIVAN) 2 MG tablet TAKE 1 TABLET BY MOUTH THREE TIMES DAILY AS NEEDED AND ONE OR TWO TABLETS AT BEDTIME  . metFORMIN (GLUCOPHAGE) 500 MG tablet TAKE 1 TO 2 TABLETS BY MOUTH TWICE DAILY WITH MEALS  . metoprolol tartrate (LOPRESSOR) 50 MG tablet Take 1 tablet (50 mg total) by mouth 2 (two) times daily.  . Omega-3 Fatty Acids (FISH OIL) 1000 MG CAPS Take 1,000 mg by mouth 2 (two) times daily.  Marland Kitchen  omeprazole (PRILOSEC) 20 MG capsule TAKE 1 CAPSULE BY MOUTH TWICE DAILY AS NEEDED  . sertraline (ZOLOFT) 50 MG tablet Take 3 tablets (150 mg total) by mouth daily.   No facility-administered encounter medications on file as of 09/02/2020.   Attempted outreach to patient to reschedule initial appointment with Debbora Dus. Multiple attempts made and messages left for patient. Unsuccessful outreach.    Star Rating Drugs: Metformin 500 mg   06/08/20  90 DS   Follow-Up:  Pharmacist Review  Debbora Dus, CPP notified  Margaretmary Dys, [place] [phone]  Total time spent for month:  CPA 25

## 2020-09-03 ENCOUNTER — Other Ambulatory Visit: Payer: Self-pay | Admitting: Family Medicine

## 2020-09-03 NOTE — Telephone Encounter (Signed)
Refill request for Lorazepam 2 mg tablets  LOV - 08/08/20 Next OV - not scheduled Last refill - 07/09/20 #150/1

## 2020-09-03 NOTE — Telephone Encounter (Signed)
Sent. Thanks.   

## 2020-09-04 ENCOUNTER — Other Ambulatory Visit: Payer: Self-pay

## 2020-09-04 MED ORDER — OMEPRAZOLE 20 MG PO CPDR: DELAYED_RELEASE_CAPSULE | ORAL | 1 refills | Status: DC

## 2020-09-18 ENCOUNTER — Other Ambulatory Visit: Payer: Self-pay

## 2020-09-18 MED ORDER — AMLODIPINE BESYLATE 5 MG PO TABS: 1.0000 | ORAL_TABLET | Freq: Every day | ORAL | 1 refills | Status: DC

## 2020-10-05 ENCOUNTER — Other Ambulatory Visit: Payer: Self-pay | Admitting: Family Medicine

## 2020-10-06 ENCOUNTER — Other Ambulatory Visit: Payer: Self-pay | Admitting: Family Medicine

## 2020-10-25 DIAGNOSIS — S62645A Nondisplaced fracture of proximal phalanx of left ring finger, initial encounter for closed fracture: Secondary | ICD-10-CM | POA: Diagnosis not present

## 2020-10-25 DIAGNOSIS — S62615A Displaced fracture of proximal phalanx of left ring finger, initial encounter for closed fracture: Secondary | ICD-10-CM | POA: Diagnosis not present

## 2020-10-25 DIAGNOSIS — M25532 Pain in left wrist: Secondary | ICD-10-CM | POA: Diagnosis not present

## 2020-10-25 DIAGNOSIS — S6992XA Unspecified injury of left wrist, hand and finger(s), initial encounter: Secondary | ICD-10-CM | POA: Diagnosis not present

## 2020-11-01 ENCOUNTER — Other Ambulatory Visit: Payer: Self-pay | Admitting: Family Medicine

## 2020-11-06 ENCOUNTER — Telehealth: Payer: Self-pay

## 2020-11-06 NOTE — Telephone Encounter (Signed)
I spoke with [name]; [name] has been having watery diarrhea 3 - 4 times a day and can get up during the night with diarrhea also for couple of weeks. No N&V and no fever, no dizziness,no abd pain and no other covid symptoms. After [name] eats 2 - 3 hours later has watery diarrhea each time. [name] said when she drinks it goes right thru her with diarrhea as well. [name] is not having dry mouth at this time. [name] wants to have a face to face visit at [facility] in [facility]. [name] will go to UC today. Sending note to Dr Damita Dunnings and [name] CMA.

## 2020-11-06 NOTE — Telephone Encounter (Signed)
[name] - Client TELEPHONE ADVICE RECORD AccessNurse Patient Name: [name] Gender: Female DOB: 1941-04-09 Age: 80 Y 12 M 24 D Return Phone Number: [phone] (Primary) Address: City/ State/ Zip: [place] [address] Client [place] [name] - Client Client Site [name] Physician Renford Dills - MD Contact Type Call Who Is Calling Patient / Member / Family / Caregiver Call Type Triage / Clinical Relationship To Patient Self Return Phone Number [phone] (Primary) Chief Complaint Diarrhea Reason for Call Symptomatic / Request for [name] states she is experiencing really bad diarrhea. Translation No Nurse Assessment Nurse: Joya Gaskins, RN, Vonna Kotyk Date/Time Eilene Ghazi Time): 11/05/2020 4:49:32 PM Confirm and document reason for call. If symptomatic, describe symptoms. ---Caller states that she has had diarrhea for a couple weeks, worse at times. She has had about 4 episodes today. Does the patient have any new or worsening symptoms? ---Yes Will a triage be completed? ---Yes Related visit to physician within the last 2 weeks? ---No Does the PT have any chronic conditions? (i.e. diabetes, asthma, this includes High risk factors for pregnancy, etc.) ---Yes List chronic conditions. ---DM, Is this a behavioral health or substance abuse call? ---No Guidelines Guideline Title Affirmed Question Affirmed Notes Nurse Date/Time (Eastern Time) Diarrhea [1] MODERATE diarrhea (e.g., 4-6 times / [name] more than normal) AND [2] present > 48 hours (2 days) Joya Gaskins, RN, Vonna Kotyk 11/05/2020 4:50:29 PM Disp. Time Eilene Ghazi Time) Disposition Final User 11/05/2020 4:52:55 PM See PCP within 24 Hours Yes Joya Gaskins, RN, Vonna Kotyk PLEASE NOTE: All timestamps contained within this report are represented as [place] Standard Time. CONFIDENTIALTY NOTICE: This fax transmission is  intended only for the addressee. It contains information that is legally privileged, confidential or otherwise protected from use or disclosure. If you are not the intended recipient, you are strictly prohibited from reviewing, disclosing, copying using or disseminating any of this information or taking any action in reliance on or regarding this information. If you have received this fax in error, please notify us immediately by telephone so that we can arrange for its return to [place]. Phone: [phone], Toll-Free: [phone], Fax: [phone] Page: 2 of 2 Call Id: [id number] [place] Disagree/Comply Comply Caller Understands Yes PreDisposition Call Doctor Care Advice Given Per Guideline SEE PCP WITHIN 24 HOURS: * IF OFFICE WILL BE OPEN: You need to be examined within the next 24 hours. Call your doctor (or NP/PA) when the office opens and make an appointment. FLUID THERAPY DURING MILD TO MODERATE DIARRHEA: DIARRHEA MEDICINE - BISMUTH SUBSALICYLATE (E.G., KAOPECTATE, PEPTO-BISMOL): CALL BACK IF: * Signs of dehydration occur (e.g., no urine over 12 hours, very dry mouth, lightheaded, etc.) * Bloody stools * Constant or severe abdomen pain * You become worse CARE ADVICE given per Diarrhea (Adult) guideline. Comments User: Ellan Lambert, RN Date/Time Eilene Ghazi Time): 11/05/2020 4:54:46 PM No answer at backline; RN advised caller that she call office in morning. Referrals Warm transfer to backline

## 2020-11-07 NOTE — Telephone Encounter (Signed)
Please get update on patient.  Thanks. 

## 2020-11-07 NOTE — Telephone Encounter (Signed)
Patient is doing much better today; has not had any diarrhea since the other day. She did take imodium yesterday and thinks that helped a lot. Patient did not go to UC or anywhere since the diarrhea slowed down. I advised patient if sx return to please seek eval and patient agrees to do so.

## 2020-11-07 NOTE — Telephone Encounter (Signed)
Noted. Thanks.

## 2020-11-11 ENCOUNTER — Other Ambulatory Visit: Payer: Self-pay

## 2020-11-14 ENCOUNTER — Telehealth: Payer: Self-pay

## 2020-11-14 NOTE — Telephone Encounter (Signed)
[facility] Night - Client Nonclinical Telephone Record AccessNurse Client [place] Night - Client Client Site [facility] Physician Renford Dills - MD Contact Type Call Who Is Calling Patient / Member / Family / Caregiver Caller Name Sharalyn Ink Phone Number [phone] Patient Name [name] Patient DOB 09-Sep-1940 Call Type Message Only Information Provided Reason for Call Request for General Office Information Initial Comment Caller states that she is calling [facility] calling about a refill request for a patient. Additional Comment Caller states that she is calling from [facility] calling about a refill request for a patient. Office hours provided. Disp. Time Disposition Final User 11/13/2020 5:28:51 PM General Information Provided Yes Virgil Benedict Call Closed By: Virgil Benedict Transaction Date/Time: 11/13/2020 5:24:51 PM (ET)

## 2020-11-14 NOTE — Telephone Encounter (Signed)
I have called this pharmacy once already and told them patient does not wish to use their services; she wants to use [facility] still. Patient was very confused when I called her about this the other day and had no clue who this pharmacy was and how she was signed up. I called the pharmacy to let them not this and they stated patient needed to call and cancel the service. I advised patient to call and cancel this set up and she was going to try but was having a very hard time understanding this. I called the pharmacy again this morning and spoke with one of the techs and notified them patient is not using this pharmacy and needs to be noted in the system; especially that patient did not understand and was not wanting to use this service. They still insisted that she call and cancel. I asked that they please stop sending requests to our office. I then called patient back and asked her to call them and cancel so this can be discontinued. She was going to try to get this done today. Still had some trouble understanding but it is not out of my hands.

## 2020-11-17 ENCOUNTER — Other Ambulatory Visit: Payer: Self-pay | Admitting: Family Medicine

## 2020-11-18 NOTE — Telephone Encounter (Signed)
Last filled 10-14-20 #150 Last OV 08-08-20 No Future OV [facility]

## 2020-11-19 NOTE — Telephone Encounter (Signed)
Sent. Thanks.   

## 2020-11-20 DIAGNOSIS — S62623A Displaced fracture of medial phalanx of left middle finger, initial encounter for closed fracture: Secondary | ICD-10-CM | POA: Diagnosis not present

## 2020-11-20 DIAGNOSIS — S62643A Nondisplaced fracture of proximal phalanx of left middle finger, initial encounter for closed fracture: Secondary | ICD-10-CM | POA: Diagnosis not present

## 2020-11-20 DIAGNOSIS — S62367A Nondisplaced fracture of neck of fifth metacarpal bone, left hand, initial encounter for closed fracture: Secondary | ICD-10-CM | POA: Diagnosis not present

## 2020-11-20 DIAGNOSIS — M79642 Pain in left hand: Secondary | ICD-10-CM | POA: Diagnosis not present

## 2020-11-20 DIAGNOSIS — S62645A Nondisplaced fracture of proximal phalanx of left ring finger, initial encounter for closed fracture: Secondary | ICD-10-CM | POA: Diagnosis not present

## 2020-12-07 ENCOUNTER — Telehealth: Payer: Self-pay | Admitting: Family Medicine

## 2020-12-07 NOTE — Telephone Encounter (Signed)
Please call patient.  My understanding is that she fell and has a fracture.  We need to get an office visit set up to talk about her medications and to try to limit fall risk.  Thanks.

## 2020-12-08 NOTE — Telephone Encounter (Signed)
Patient scheduled for 12/16/20 at 3:30 pm.

## 2020-12-11 DIAGNOSIS — S62643D Nondisplaced fracture of proximal phalanx of left middle finger, subsequent encounter for fracture with routine healing: Secondary | ICD-10-CM | POA: Diagnosis not present

## 2020-12-11 DIAGNOSIS — S62367D Nondisplaced fracture of neck of fifth metacarpal bone, left hand, subsequent encounter for fracture with routine healing: Secondary | ICD-10-CM | POA: Diagnosis not present

## 2020-12-11 DIAGNOSIS — S62645D Nondisplaced fracture of proximal phalanx of left ring finger, subsequent encounter for fracture with routine healing: Secondary | ICD-10-CM | POA: Diagnosis not present

## 2020-12-11 DIAGNOSIS — S62623D Displaced fracture of medial phalanx of left middle finger, subsequent encounter for fracture with routine healing: Secondary | ICD-10-CM | POA: Diagnosis not present

## 2020-12-16 ENCOUNTER — Encounter: Payer: Self-pay | Admitting: Family Medicine

## 2020-12-16 ENCOUNTER — Ambulatory Visit (INDEPENDENT_AMBULATORY_CARE_PROVIDER_SITE_OTHER): Payer: Medicare Other | Admitting: Family Medicine

## 2020-12-16 ENCOUNTER — Other Ambulatory Visit: Payer: Self-pay

## 2020-12-16 VITALS — BP 122/70 | HR 63 | Temp 97.3°F | Ht 60.0 in | Wt 125.0 lb

## 2020-12-16 DIAGNOSIS — E119 Type 2 diabetes mellitus without complications: Secondary | ICD-10-CM | POA: Diagnosis not present

## 2020-12-16 DIAGNOSIS — S6292XD Unspecified fracture of left wrist and hand, subsequent encounter for fracture with routine healing: Secondary | ICD-10-CM

## 2020-12-16 DIAGNOSIS — F411 Generalized anxiety disorder: Secondary | ICD-10-CM

## 2020-12-16 DIAGNOSIS — Z659 Problem related to unspecified psychosocial circumstances: Secondary | ICD-10-CM | POA: Diagnosis not present

## 2020-12-16 MED ORDER — LORAZEPAM 2 MG PO TABS: ORAL_TABLET | ORAL | Status: DC

## 2020-12-16 MED ORDER — ACCU-CHEK AVIVA PLUS W/DEVICE KIT: PACK | 0 refills | Status: AC

## 2020-12-16 NOTE — Patient Instructions (Signed)
Let me contact social work - they may have ideas about housing options.   Go to the lab on the way out.   If you have mychart we'll likely use that to update you.    Take care.  Glad to see you. Try gradually weaning back on the lorazepam.  Try to take 1/2 tab instead of a whole tab and gradually wean down from 4------>3.5------>3------>2.5.   Update me as needed.

## 2020-12-16 NOTE — Progress Notes (Signed)
This visit occurred during the SARS-CoV-2 public health emergency.  Safety protocols were in place, including screening questions prior to the visit, additional usage of staff PPE, and extensive cleaning of exam room while observing appropriate contact time as indicated for disinfecting solutions.  Anxiety/fall/fx (L hand fx) d/w pt.  She was going out the front door and fell, lost her balance.  Neighbor helped her up.  Then got checked the next day.  Out of cast, using a brace.  Has hand ROM exercises to do.  Less pain now but still sore.  She isn't lightheaded.    Mood d/w pt.  "Better than I was."  She reported already cutting back on lorazepam, down from 2 tabs qhs to 1 tab. discussed with patient about gradual benzodiazepine taper.  DM2.  Taking 2 metformin AM and 1 in the PM.  Sugar not checked often at home.  No ADE on med. Rx sent for new meter per patient request.    She is considering housing options.  She would like to be living on her own independently.  We talked about it.  I told her I would check with social work and see what options were available.  Meds, vitals, and allergies reviewed.   ROS: Per HPI unless specifically indicated in ROS section   GEN: nad, alert and oriented HEENT: ncat NECK: supple w/o LA CV: rrr. PULM: ctab, no inc wob ABD: soft, +bs EXT: no edema SKIN: no acute rash Small lipoma R lower back, not ttp.   L hand with ROM intact but sore with making a fist.  NV intact  Diabetic foot exam: Normal inspection No skin breakdown No calluses  Normal DP pulses Normal sensation to light touch and monofilament Nails normal

## 2020-12-17 DIAGNOSIS — S6292XA Unspecified fracture of left wrist and hand, initial encounter for closed fracture: Secondary | ICD-10-CM | POA: Insufficient documentation

## 2020-12-17 LAB — VITAMIN D 25 HYDROXY (VIT D DEFICIENCY, FRACTURES): VITD: 35.79 ng/mL (ref 30.00–100.00)

## 2020-12-17 LAB — HEMOGLOBIN A1C: Hgb A1c MFr Bld: 7.2 % — ABNORMAL HIGH (ref 4.6–6.5)

## 2020-12-17 NOTE — Assessment & Plan Note (Signed)
See notes on vitamin D.  She will follow-up with orthopedics.  Continue using splint in the meantime.

## 2020-12-17 NOTE — Assessment & Plan Note (Signed)
Discussed fall risk.  See after visit summary.  Gradually taper of benzodiazepine use.  Not sedated.  Okay for outpatient follow-up.  Continue sertraline.

## 2020-12-17 NOTE — Assessment & Plan Note (Signed)
Continue metformin.  See notes on labs. 

## 2020-12-18 ENCOUNTER — Telehealth: Payer: Self-pay | Admitting: *Deleted

## 2020-12-18 NOTE — Telephone Encounter (Signed)
   Telephone encounter was:  Unsuccessful.  12/18/2020 Name: [name] MRN: [id number] DOB: [birth year]  Unsuccessful outbound call made today to assist with:  Home Modifications and Caregiver Stress  Outreach Attempt:  1st Attempt  A HIPAA compliant voice message was left requesting a return call.  Instructed patient to call back at   Instructed patient to call back at [phone]  at their earliest convenience. .  [place], Care Management  [phone] [address] , [place] [address] Email : [email or web address]

## 2020-12-19 ENCOUNTER — Telehealth: Payer: Self-pay | Admitting: *Deleted

## 2020-12-19 NOTE — Chronic Care Management (AMB) (Signed)
  Chronic Care Management   Outreach Note  12/19/2020 Name: [name] MRN: [id number] DOB: June 07, 1941  [name] is a 80 y.o. year old female who is a primary care patient of Tonia Ghent, MD. I reached out to Kennon Holter by phone today in response to a referral sent by Ms. [place] PCP  Tonia Ghent, MD     An unsuccessful telephone outreach was attempted today. The patient was referred to the case management team for assistance with care management and care coordination.   Follow Up Plan: A HIPAA compliant phone message was left for the patient providing contact information and requesting a return call.  If patient returns call to provider office, please advise to call Embedded Care Management Care Guide Aydien Majette at [place], [place] Management  Direct Dial: [phone]

## 2020-12-22 ENCOUNTER — Telehealth: Payer: Self-pay | Admitting: *Deleted

## 2020-12-22 ENCOUNTER — Other Ambulatory Visit: Payer: Self-pay

## 2020-12-22 MED ORDER — BLOOD GLUCOSE METER KIT: PACK | 0 refills | Status: DC

## 2020-12-22 NOTE — Telephone Encounter (Signed)
    Telephone encounter was:  Unsuccessful.  12/22/2020 Name: [name] MRN: [id number] DOB: 01/22/1941  Unsuccessful outbound call made today to assist with:  Home Modifications  Outreach Attempt:  2nd Attempt  No voicemail  [place], Care Management  [phone] [address] , [place] [address] Email : [email or web address]

## 2020-12-22 NOTE — Telephone Encounter (Signed)
   Telephone encounter was:  Unsuccessful.  12/22/2020 Name: [name] MRN: [id number] DOB: Aug 15, 1940  Unsuccessful outbound call made today to assist with:  Home Modifications  Outreach Attempt:  2nd Attempt  A HIPAA compliant voice message was left requesting a return call.  Instructed patient to call back at    Instructed patient to call back at [phone]  at their earliest convenience.   [facility] [place], Care Management  [phone] [address] , [place] [address] Email : [email or web address]

## 2020-12-23 ENCOUNTER — Other Ambulatory Visit: Payer: Self-pay | Admitting: Family Medicine

## 2020-12-24 ENCOUNTER — Telehealth: Payer: Self-pay | Admitting: *Deleted

## 2020-12-24 NOTE — Telephone Encounter (Signed)
   Telephone encounter was:  Unsuccessful.  12/24/2020 Name: [name] MRN: [id number] DOB: [birth year]  Unsuccessful outbound call made today to assist with:  Home Modifications and Caregiver Stress  Outreach Attempt:  3rd Attempt.  Referral closed unable to contact patient. busy  [facility] [facility], Care Management  [phone] [address] , [place] [address] Email : [email or web address] [email or web address]

## 2020-12-25 ENCOUNTER — Telehealth: Payer: Self-pay

## 2020-12-25 MED ORDER — METOPROLOL TARTRATE 50 MG PO TABS: 50.0000 mg | ORAL_TABLET | Freq: Two times a day (BID) | ORAL | 3 refills | Status: DC

## 2020-12-25 MED ORDER — OMEPRAZOLE 20 MG PO CPDR: DELAYED_RELEASE_CAPSULE | ORAL | 3 refills | Status: DC

## 2020-12-25 NOTE — Telephone Encounter (Signed)
Rxs sent electronically.  

## 2020-12-26 ENCOUNTER — Other Ambulatory Visit: Payer: Self-pay | Admitting: Family Medicine

## 2020-12-29 ENCOUNTER — Other Ambulatory Visit: Payer: Self-pay

## 2020-12-29 MED ORDER — BUPROPION HCL ER (SR) 100 MG PO TB12: 100.0000 mg | ORAL_TABLET | Freq: Every day | ORAL | 1 refills | Status: DC

## 2020-12-29 MED ORDER — METFORMIN HCL 500 MG PO TABS: ORAL_TABLET | ORAL | 2 refills | Status: DC

## 2020-12-29 MED ORDER — AMLODIPINE BESYLATE 5 MG PO TABS: 5.0000 mg | ORAL_TABLET | Freq: Every day | ORAL | 1 refills | Status: DC

## 2020-12-29 MED ORDER — SERTRALINE HCL 50 MG PO TABS: 150.0000 mg | ORAL_TABLET | Freq: Every day | ORAL | 1 refills | Status: DC

## 2020-12-29 NOTE — Chronic Care Management (AMB) (Signed)
  Chronic Care Management   Outreach Note  12/29/2020 Name: [name] MRN: [id number] DOB: [birth year]  [name] is a 80 y.o. year old female who is a primary care patient of Tonia Ghent, MD. I reached out to Kennon Holter by phone today in response to a referral sent by Ms. [place] PCP Tonia Ghent, MD     A second unsuccessful telephone outreach was attempted today. The patient was referred to the case management team for assistance with care management and care coordination.   Follow Up Plan: A HIPAA compliant phone message was left for the patient providing contact information and requesting a return call.  If patient returns call to provider office, please advise to call Embedded Care Management Care Guide Gracey Tolle at [place], [place] Management  Direct Dial: [phone]

## 2021-01-07 NOTE — Chronic Care Management (AMB) (Signed)
  Chronic Care Management   Note  01/07/2021 Name: [name] MRN: [id number] DOB: [birth year]  [name] is a 80 y.o. year old female who is a primary care patient of Tonia Ghent, MD. I reached out to Kennon Holter by phone today in response to a referral sent by [name]'s PCP Tonia Ghent, MD     Ms. [place] was given information about Chronic Care Management services today including:  CCM service includes personalized support from designated clinical staff supervised by her physician, including individualized plan of care and coordination with other care providers 24/7 contact phone numbers for assistance for urgent and routine care needs. Service will only be billed when office clinical staff spend 20 minutes or more in a month to coordinate care. Only one practitioner may furnish and bill the service in a calendar month. The patient may stop CCM services at any time (effective at the end of the month) by phone call to the office staff. The patient will be responsible for cost sharing (co-pay) of up to 20% of the service fee (after annual deductible is met).  Patient agreed to services and verbal consent obtained.   Follow up plan: Telephone appointment with care management team member scheduled for: 01/16/2021  [facility] [place], [place] [facility]  Direct Dial: [phone]

## 2021-01-16 ENCOUNTER — Ambulatory Visit (INDEPENDENT_AMBULATORY_CARE_PROVIDER_SITE_OTHER): Payer: Medicare Other | Admitting: *Deleted

## 2021-01-16 DIAGNOSIS — E119 Type 2 diabetes mellitus without complications: Secondary | ICD-10-CM | POA: Diagnosis not present

## 2021-01-16 DIAGNOSIS — Z659 Problem related to unspecified psychosocial circumstances: Secondary | ICD-10-CM

## 2021-01-18 NOTE — Chronic Care Management (AMB) (Signed)
Chronic Care Management    Clinical Social Work Note  01/18/2021 Name: [name] MRN: [id number] DOB: 21-Jan-1941  [name] is a 80 y.o. year old female who is a primary care patient of Tonia Ghent, MD. The CCM team was consulted to assist the patient with chronic disease management and/or care coordination needs related to: [employer]  and housing needs .   Engaged with patient by telephone for initial visit in response to provider referral for social work chronic care management and care coordination services.   Consent to Services:  The patient was given information about Chronic Care Management services, agreed to services, and gave verbal consent prior to initiation of services.  Please see initial visit note for detailed documentation.   Patient agreed to services and consent obtained.   Assessment: Review of patient past medical history, allergies, medications, and health status, including review of relevant consultants reports was performed today as part of a comprehensive evaluation and provision of chronic care management and care coordination services.     SDOH (Social Determinants of Health) assessments and interventions performed:    Advanced Directives Status: See Care Plan for related entries.  CCM Care Plan  Allergies  Allergen Reactions   Bisphosphonates     Unclear relation to meds, but pt had long bone fracture after treatment with fosamax   Diclofenac Potassium     Not sure   Naproxen Sodium     Not sure   Sulfonamide Derivatives    Ezetimibe-Simvastatin     myalgias   Simvastatin     myalgias    Outpatient Encounter Medications as of 01/16/2021  Medication Sig   ACCU-CHEK SOFTCLIX LANCETS lancets USE ONCE DAILY AS DIRECTED.  E11.9   amLODipine (NORVASC) 5 MG tablet Take 1 tablet (5 mg total) by mouth daily.   blood glucose meter kit and supplies Dispense based on patient and insurance preference. Use up to four times daily as  directed. (FOR ICD-10 E10.9, E11.9).   Blood Glucose Monitoring Suppl (ACCU-CHEK AVIVA PLUS) w/Device KIT Use as instructed to test blood sugar once daily or as directed.  Dx:  E11.9  Non-insulin dependent.   buPROPion (WELLBUTRIN SR) 100 MG 12 hr tablet Take 1 tablet (100 mg total) by mouth daily.   cholecalciferol (VITAMIN D) 1000 UNITS tablet Take 1,000 Units by mouth daily.   cyanocobalamin (,VITAMIN B-12,) 1000 MCG/ML injection INJECT 1 ML  INTRAMUSCULARLY MONTHLY   cyclobenzaprine (FLEXERIL) 10 MG tablet TAKE 1/2 TO 1 (ONE-HALF TO ONE) TABLET BY MOUTH TWICE DAILY AS NEEDED FOR MUSCLE SPASM (SEDATION CAUTION. SKIP LORAZEPAM WHEN USED)   fluticasone (FLONASE) 50 MCG/ACT nasal spray Place 2 sprays into both nostrils daily.   glucose blood (ACCU-CHEK AVIVA PLUS) test strip USE ONE STRIP TO CHECK GLUCOSE ONCE DAILY.  E11.9   Insulin Syringe-Needle U-100 (B-D INSULIN SYRINGE 1CC/25GX1") 25G X 1" 1 ML MISC Use for B12 injection   LORazepam (ATIVAN) 2 MG tablet 1/2 to 1 tab up to 4 times a day as needed for anxiety/insomnia   metFORMIN (GLUCOPHAGE) 500 MG tablet Take 1 to 2 tablets by mouth twice a day with meals   metoprolol tartrate (LOPRESSOR) 50 MG tablet Take 1 tablet (50 mg total) by mouth 2 (two) times daily.   Omega-3 Fatty Acids (FISH OIL) 1000 MG CAPS Take 1,000 mg by mouth 2 (two) times daily.   omeprazole (PRILOSEC) 20 MG capsule TAKE 1 CAPSULE BY MOUTH TWICE DAILY AS NEEDED  sertraline (ZOLOFT) 50 MG tablet Take 3 tablets (150 mg total) by mouth daily.   No facility-administered encounter medications on file as of 01/16/2021.    Patient Active Problem List   Diagnosis Date Noted   Closed left hand fracture 12/17/2020   Diarrhea 04/13/2020   Memory change 07/06/2019   B12 deficiency 07/06/2019   Sinus pressure 03/22/2019   Healthcare maintenance 10/16/2018   Back pain 10/16/2018   Community acquired pneumonia 03/29/2018   Left knee pain 09/30/2016   Hypertension    Colovaginal  fistula s/p LAR rectosigmoid resection & colovaginal fistula repair/omentopexy 05/14/2016 05/14/2016   Back spasm 08/05/2015   Dysuria 08/05/2015   Advance care planning 02/07/15   Sudden death of family member 02-13-2014   Medicare annual wellness visit, initial 10/08/2013   Chest wall pain 02/23/2012   Breast cancer of upper-inner quadrant of left female breast ([facility]) 08/12/2011   Iron deficiency anemia 11/20/2010   SHOULDER PAIN, LEFT 07/03/2010   DM (diabetes mellitus) ([facility]) February 13, 2010   HLD (hyperlipidemia) 01/27/2010   GERD 01/27/2010   Osteoporosis 01/27/2010   Insomnia 01/27/2010   Anxiety state 01/02/2010    Conditions to be addressed/monitored:  stress related to housing/environment ; Lacks knowledge of community resource: housing resources  Care Plan : LCSW Plan of Care  Updates made by Deirdre Peer, LCSW since 01/18/2021 12:00 AM     Problem: Wants to move out on her own- level of care   Priority: High     Long-Range Goal: Quality of Life Maximized   Start Date: 01/16/2021  Expected End Date: 02/18/2021  This Visit's Progress: On track  Priority: High  Note:   Current barriers:    Housing barriers Clinical Goals: Patient will work with SW and Care Guide to address needs related to housing options Clinical Interventions:  CSW spoke with pt who reports she is interested in getting her own place to live. Pt resides with her daughter and a grandson. "I have to share a room with my daughter". Pt reports she is independent, drives and would like to pursue housing options. Given pt's age; CSW suggested considering a senior apartment type setting. CSW will ask Care Guide to reach out to pt regarding housing options to consider.  Pt is not eligible for Medicaid and does not have LTC or VA benefits.   Collaboration with Tonia Ghent, MD regarding development and update of comprehensive plan of care as evidenced by provider attestation and co-signature Inter-disciplinary  care team collaboration (see longitudinal plan of care) Assessment of needs, barriers , agencies contacted, as well as how impacting Review various resources, discussed options and provided patient information about  Housing resources (senior apartment type settings) Patient interviewed and appropriate assessments performed Referred patient to community resources care guide team for assistance with housing resources Solution-Focused Strategies, Active listening / Reflection utilized , and Problem [place]  Patient Goals/Self-Care Activities: Over the next 30 days  - begin a notebook of services in my neighborhood or community - call 211 when I need some help - follow-up on any referrals for help I am given - think ahead to make sure my need does not become an emergency - make a note about what I need to have by the phone or take with me, like an identification card or social security number have a back-up plan - have a back-up plan - make a list of family or friends that I can call  Follow Up Plan: SW will follow up with patient by phone over the next 2 weeks and [place] will reach out to patient for assistance with housing.      Eduard Clos MSW, LCSW Licensed Clinical Social Worker [place] [phone]

## 2021-01-20 ENCOUNTER — Other Ambulatory Visit: Payer: Self-pay | Admitting: Family Medicine

## 2021-01-21 ENCOUNTER — Telehealth: Payer: Self-pay

## 2021-01-21 MED ORDER — LORAZEPAM 2 MG PO TABS: ORAL_TABLET | ORAL | 0 refills | Status: DC

## 2021-01-21 NOTE — Telephone Encounter (Signed)
Rx sent with updated sig for taper.

## 2021-01-21 NOTE — Telephone Encounter (Signed)
Refill request for lorazepam 2 mg tablets  LOV - 12/16/20 Next OV - not scheduled Last refill - 11/17/20

## 2021-01-21 NOTE — Telephone Encounter (Signed)
   Telephone encounter was:  Successful.  01/21/2021 Name: [name] MRN: [id number] DOB: [birth year]  [name] is a 80 y.o. year old female who is a primary care patient of Tonia Ghent, MD . The community resource team was consulted for assistance with Food Insecurity and housing.  Care guide performed the following interventions: Spoke with patient confirmed email address [email or web address] sent information for [place] [facility]/serve all [facility] campuses and counties, [facility] DSS/Food Stamps, [employer], [employer].  Follow Up Plan:  Care guide will follow up with patient by phone over the next 7 days.  Carine Nordgren, AAS Paralegal, [facility]  [address], [place] [address] ??[email or web address]  ?? [id number]   [email or web address]

## 2021-01-22 DIAGNOSIS — S62623D Displaced fracture of medial phalanx of left middle finger, subsequent encounter for fracture with routine healing: Secondary | ICD-10-CM | POA: Diagnosis not present

## 2021-01-22 DIAGNOSIS — S62645D Nondisplaced fracture of proximal phalanx of left ring finger, subsequent encounter for fracture with routine healing: Secondary | ICD-10-CM | POA: Diagnosis not present

## 2021-01-22 DIAGNOSIS — S62643D Nondisplaced fracture of proximal phalanx of left middle finger, subsequent encounter for fracture with routine healing: Secondary | ICD-10-CM | POA: Diagnosis not present

## 2021-01-22 DIAGNOSIS — S62367D Nondisplaced fracture of neck of fifth metacarpal bone, left hand, subsequent encounter for fracture with routine healing: Secondary | ICD-10-CM | POA: Diagnosis not present

## 2021-01-23 ENCOUNTER — Ambulatory Visit: Payer: Medicare Other | Admitting: Family Medicine

## 2021-01-29 ENCOUNTER — Telehealth: Payer: Self-pay

## 2021-01-29 NOTE — Telephone Encounter (Signed)
   Telephone encounter was:  Successful.  01/29/2021 Name: [name] MRN: [id number] DOB: 31-Dec-1940  [name] is a 80 y.o. year old female who is a primary care patient of Tonia Ghent, MD . The community resource team was consulted for assistance with  food stamps, housing and life alert.  Care guide performed the following interventions: Spoke with patient she has received resources emailed for [place] and [employer].  Follow Up Plan:  No further follow up planned at this time. The patient has been provided with needed resources.  [name], AAS Paralegal, [facility]  [address], [place] [address] ??[name]  ?? [id number]   [email or web address]

## 2021-02-03 ENCOUNTER — Ambulatory Visit: Payer: Medicare Other | Admitting: *Deleted

## 2021-02-03 DIAGNOSIS — S6292XD Unspecified fracture of left wrist and hand, subsequent encounter for fracture with routine healing: Secondary | ICD-10-CM

## 2021-02-03 DIAGNOSIS — Z659 Problem related to unspecified psychosocial circumstances: Secondary | ICD-10-CM

## 2021-02-03 DIAGNOSIS — E119 Type 2 diabetes mellitus without complications: Secondary | ICD-10-CM

## 2021-02-04 NOTE — Chronic Care Management (AMB) (Signed)
Chronic Care Management    Clinical Social Work Note  02/04/2021 Name: [name] MRN: [id number] DOB: 1941/03/29  [name] is a 80 y.o. year old female who is a primary care patient of Tonia Ghent, MD. The CCM team was consulted to assist the patient with chronic disease management and/or care coordination needs related to: [employer] .   Engaged with patient by telephone for follow up visit in response to provider referral for social work chronic care management and care coordination services.   Consent to Services:  The patient was given information about Chronic Care Management services, agreed to services, and gave verbal consent prior to initiation of services.  Please see initial visit note for detailed documentation.   Patient agreed to services and consent obtained.   Assessment: Review of patient past medical history, allergies, medications, and health status, including review of relevant consultants reports was performed today as part of a comprehensive evaluation and provision of chronic care management and care coordination services.     SDOH (Social Determinants of Health) assessments and interventions performed:    Advanced Directives Status: Not addressed in this encounter.  CCM Care Plan  Allergies  Allergen Reactions   Bisphosphonates     Unclear relation to meds, but pt had long bone fracture after treatment with fosamax   Diclofenac Potassium     Not sure   Naproxen Sodium     Not sure   Sulfonamide Derivatives    Ezetimibe-Simvastatin     myalgias   Simvastatin     myalgias    Outpatient Encounter Medications as of 02/03/2021  Medication Sig   ACCU-CHEK SOFTCLIX LANCETS lancets USE ONCE DAILY AS DIRECTED.  E11.9   amLODipine (NORVASC) 5 MG tablet Take 1 tablet (5 mg total) by mouth daily.   blood glucose meter kit and supplies Dispense based on patient and insurance preference. Use up to four times daily as directed. (FOR ICD-10  E10.9, E11.9).   Blood Glucose Monitoring Suppl (ACCU-CHEK AVIVA PLUS) w/Device KIT Use as instructed to test blood sugar once daily or as directed.  Dx:  E11.9  Non-insulin dependent.   buPROPion (WELLBUTRIN SR) 100 MG 12 hr tablet Take 1 tablet (100 mg total) by mouth daily.   cholecalciferol (VITAMIN D) 1000 UNITS tablet Take 1,000 Units by mouth daily.   cyanocobalamin (,VITAMIN B-12,) 1000 MCG/ML injection INJECT 1 ML  INTRAMUSCULARLY MONTHLY   cyclobenzaprine (FLEXERIL) 10 MG tablet TAKE 1/2 TO 1 (ONE-HALF TO ONE) TABLET BY MOUTH TWICE DAILY AS NEEDED FOR MUSCLE SPASM (SEDATION CAUTION. SKIP LORAZEPAM WHEN USED)   fluticasone (FLONASE) 50 MCG/ACT nasal spray Place 2 sprays into both nostrils daily.   glucose blood (ACCU-CHEK AVIVA PLUS) test strip USE ONE STRIP TO CHECK GLUCOSE ONCE DAILY.  E11.9   Insulin Syringe-Needle U-100 (B-D INSULIN SYRINGE 1CC/25GX1") 25G X 1" 1 ML MISC Use for B12 injection   LORazepam (ATIVAN) 2 MG tablet 1/2 to 1 tab up to 4 times a day as needed for anxiety/insomnia.  Taper dose slowly.   metFORMIN (GLUCOPHAGE) 500 MG tablet Take 1 to 2 tablets by mouth twice a day with meals   metoprolol tartrate (LOPRESSOR) 50 MG tablet Take 1 tablet (50 mg total) by mouth 2 (two) times daily.   Omega-3 Fatty Acids (FISH OIL) 1000 MG CAPS Take 1,000 mg by mouth 2 (two) times daily.   omeprazole (PRILOSEC) 20 MG capsule TAKE 1 CAPSULE BY MOUTH TWICE DAILY AS NEEDED  sertraline (ZOLOFT) 50 MG tablet Take 3 tablets (150 mg total) by mouth daily.   No facility-administered encounter medications on file as of 02/03/2021.    Patient Active Problem List   Diagnosis Date Noted   Closed left hand fracture 12/17/2020   Diarrhea 04/13/2020   Memory change 07/06/2019   B12 deficiency 07/06/2019   Sinus pressure 03/22/2019   Healthcare maintenance 10/16/2018   Back pain 10/16/2018   Community acquired pneumonia 03/29/2018   Left knee pain 09/30/2016   Hypertension    Colovaginal  fistula s/p LAR rectosigmoid resection & colovaginal fistula repair/omentopexy 05/14/2016 05/14/2016   Back spasm 08/05/2015   Dysuria 08/05/2015   Advance care planning 02/20/15   Sudden death of family member 02-26-14   Medicare annual wellness visit, initial 10/08/2013   Chest wall pain 02/23/2012   Breast cancer of upper-inner quadrant of left female breast ([place]) 08/12/2011   Iron deficiency anemia 11/20/2010   SHOULDER PAIN, LEFT 07/03/2010   DM (diabetes mellitus) ([place]) 02-26-10   HLD (hyperlipidemia) 01/27/2010   GERD 01/27/2010   Osteoporosis 01/27/2010   Insomnia 01/27/2010   Anxiety state 01/02/2010    Conditions to be addressed/monitored:  housing needs ; Financial constraints related to limited income, Housing barriers, and Lacks knowledge of community resource:    Care Plan : LCSW Plan of Care  Updates made by Natalie Peer, LCSW since 02/04/2021 12:00 AM     Problem: Wants to move out on her own- level of care   Priority: High     Long-Range Goal: Quality of Life Maximized   Start Date: 01/16/2021  Expected End Date: 02/18/2021  This Visit's Progress: On track  Recent Progress: On track  Priority: High  Note:   Current barriers:    Housing barriers Clinical Goals: Patient will work with SW and Care Guide to address needs related to housing options Clinical Interventions:   Pt reports she is uncertain if she has received the resources- not at home. Will check in again next week. She does still want to seek housing options.   Pt resides with her daughter and a grandson. "I have to share a room with my daughter". Pt reports she is independent, drives and would like to pursue housing options. Given pt's age; CSW suggested considering a senior apartment type setting. CSW will ask Care Guide to reach out to pt regarding housing options to consider.  Pt is not eligible for Medicaid and does not have LTC or VA benefits.   Collaboration with Tonia Ghent, MD  regarding development and update of comprehensive plan of care as evidenced by provider attestation and co-signature Inter-disciplinary care team collaboration (see longitudinal plan of care) Assessment of needs, barriers , agencies contacted, as well as how impacting Review various resources, discussed options and provided patient information about  Housing resources (senior apartment type settings) Patient interviewed and appropriate assessments performed Referred patient to community resources care guide team for assistance with housing resources Solution-Focused Strategies, Active listening / Reflection utilized , and Problem [place]  Patient Goals/Self-Care Activities: Over the next 30 days  - begin a notebook of services in my neighborhood or community - call 211 when I need some help - follow-up on any referrals for help I am given - think ahead to make sure my need does not become an emergency - make a note about what I need to have by the phone or take with me, like an identification card or social security number  have a back-up plan - have a back-up plan - make a list of family or friends that I can call         Follow Up Plan: Appointment scheduled for SW follow up with client by phone on: 02/12/21      Eduard Clos MSW, [place] Licensed Clinical Social Worker [place] [phone]

## 2021-02-04 NOTE — Patient Instructions (Signed)
Visit Information  PATIENT GOALS:  Goals Addressed               This Visit's Progress     Find Help in My Community (pt-stated)        Timeframe:  Short-Term Goal Priority:  High Start Date:  01/16/21                           Expected End Date:     02/18/21                  Follow Up Date 02/12/21    - begin a notebook of services in my neighborhood or community - call 211 when I need some help - follow-up on any referrals for help I am given - think ahead to make sure my need does not become an emergency - make a note about what I need to have by the phone or take with me, like an identification card or social security number have a back-up plan - have a back-up plan - make a list of family or friends that I can call    Why is this important?   Knowing how and where to find help for yourself or family in your neighborhood and community is an important skill.  You will want to take some steps to learn how.    Notes:         The patient verbalized understanding of instructions, educational materials, and care plan provided today and declined offer to receive copy of patient instructions, educational materials, and care plan.   Telephone follow up appointment with care management team member scheduled for:02/12/21 Eduard Clos MSW, LCSW Licensed Clinical Social Worker [place] [phone]

## 2021-02-12 ENCOUNTER — Ambulatory Visit (INDEPENDENT_AMBULATORY_CARE_PROVIDER_SITE_OTHER): Payer: Medicare Other | Admitting: *Deleted

## 2021-02-12 DIAGNOSIS — Z659 Problem related to unspecified psychosocial circumstances: Secondary | ICD-10-CM

## 2021-02-12 DIAGNOSIS — F411 Generalized anxiety disorder: Secondary | ICD-10-CM

## 2021-02-12 DIAGNOSIS — E119 Type 2 diabetes mellitus without complications: Secondary | ICD-10-CM

## 2021-02-12 DIAGNOSIS — M549 Dorsalgia, unspecified: Secondary | ICD-10-CM

## 2021-02-12 DIAGNOSIS — S6292XD Unspecified fracture of left wrist and hand, subsequent encounter for fracture with routine healing: Secondary | ICD-10-CM

## 2021-02-12 NOTE — Chronic Care Management (AMB) (Signed)
Chronic Care Management    Clinical Social Work Note  02/12/2021 Name: [name] MRN: [id number] DOB: 1941-02-20  [name] is a 80 y.o. year old female who is a primary care patient of Joaquim Nam, MD. The CCM team was consulted to assist the patient with chronic disease management and/or care coordination needs related to: [employer] .   Engaged with patient by telephone for follow up visit in response to provider referral for social work chronic care management and care coordination services.   Consent to Services:  The patient was given information about Chronic Care Management services, agreed to services, and gave verbal consent prior to initiation of services.  Please see initial visit note for detailed documentation.   Patient agreed to services and consent obtained.   Assessment: Review of patient past medical history, allergies, medications, and health status, including review of relevant consultants reports was performed today as part of a comprehensive evaluation and provision of chronic care management and care coordination services.     SDOH (Social Determinants of Health) assessments and interventions performed:    Advanced Directives Status: Not addressed in this encounter.  CCM Care Plan  Allergies  Allergen Reactions   Bisphosphonates     Unclear relation to meds, but pt had long bone fracture after treatment with fosamax   Diclofenac Potassium     Not sure   Naproxen Sodium     Not sure   Sulfonamide Derivatives    Ezetimibe-Simvastatin     myalgias   Simvastatin     myalgias    Outpatient Encounter Medications as of 02/12/2021  Medication Sig   ACCU-CHEK SOFTCLIX LANCETS lancets USE ONCE DAILY AS DIRECTED.  E11.9   amLODipine (NORVASC) 5 MG tablet Take 1 tablet (5 mg total) by mouth daily.   blood glucose meter kit and supplies Dispense based on patient and insurance preference. Use up to four times daily as directed. (FOR ICD-10  E10.9, E11.9).   Blood Glucose Monitoring Suppl (ACCU-CHEK AVIVA PLUS) w/Device KIT Use as instructed to test blood sugar once daily or as directed.  Dx:  E11.9  Non-insulin dependent.   buPROPion (WELLBUTRIN SR) 100 MG 12 hr tablet Take 1 tablet (100 mg total) by mouth daily.   cholecalciferol (VITAMIN D) 1000 UNITS tablet Take 1,000 Units by mouth daily.   cyanocobalamin (,VITAMIN B-12,) 1000 MCG/ML injection INJECT 1 ML  INTRAMUSCULARLY MONTHLY   cyclobenzaprine (FLEXERIL) 10 MG tablet TAKE 1/2 TO 1 (ONE-HALF TO ONE) TABLET BY MOUTH TWICE DAILY AS NEEDED FOR MUSCLE SPASM (SEDATION CAUTION. SKIP LORAZEPAM WHEN USED)   fluticasone (FLONASE) 50 MCG/ACT nasal spray Place 2 sprays into both nostrils daily.   glucose blood (ACCU-CHEK AVIVA PLUS) test strip USE ONE STRIP TO CHECK GLUCOSE ONCE DAILY.  E11.9   Insulin Syringe-Needle U-100 (B-D INSULIN SYRINGE 1CC/25GX1") 25G X 1" 1 ML MISC Use for B12 injection   LORazepam (ATIVAN) 2 MG tablet 1/2 to 1 tab up to 4 times a day as needed for anxiety/insomnia.  Taper dose slowly.   metFORMIN (GLUCOPHAGE) 500 MG tablet Take 1 to 2 tablets by mouth twice a day with meals   metoprolol tartrate (LOPRESSOR) 50 MG tablet Take 1 tablet (50 mg total) by mouth 2 (two) times daily.   Omega-3 Fatty Acids (FISH OIL) 1000 MG CAPS Take 1,000 mg by mouth 2 (two) times daily.   omeprazole (PRILOSEC) 20 MG capsule TAKE 1 CAPSULE BY MOUTH TWICE DAILY AS NEEDED  sertraline (ZOLOFT) 50 MG tablet Take 3 tablets (150 mg total) by mouth daily.   No facility-administered encounter medications on file as of 02/12/2021.    Patient Active Problem List   Diagnosis Date Noted   Closed left hand fracture 12/17/2020   Diarrhea 04/13/2020   Memory change 07/06/2019   B12 deficiency 07/06/2019   Sinus pressure 03/22/2019   Healthcare maintenance 10/16/2018   Back pain 10/16/2018   Community acquired pneumonia 03/29/2018   Left knee pain 09/30/2016   Hypertension    Colovaginal  fistula s/p LAR rectosigmoid resection & colovaginal fistula repair/omentopexy 05/14/2016 05/14/2016   Back spasm 08/05/2015   Dysuria 08/05/2015   Advance care planning Feb 16, 2015   Sudden death of family member Feb 22, 2014   Medicare annual wellness visit, initial 10/08/2013   Chest wall pain 02/23/2012   Breast cancer of upper-inner quadrant of left female breast ([place]) 08/12/2011   Iron deficiency anemia 11/20/2010   SHOULDER PAIN, LEFT 07/03/2010   DM (diabetes mellitus) ([place]) February 22, 2010   HLD (hyperlipidemia) 01/27/2010   GERD 01/27/2010   Osteoporosis 01/27/2010   Insomnia 01/27/2010   Anxiety state 01/02/2010    Conditions to be addressed/monitored: Anxiety and Depression; Housing barriers and Lacks knowledge of community resource:    Care Plan : LCSW Plan of Care  Updates made by Deirdre Peer, LCSW since 02/12/2021 12:00 AM     Problem: Wants to move out on her own- level of care   Priority: High     Long-Range Goal: Quality of Life Maximized   Start Date: 01/16/2021  Expected End Date: 03/12/2021  This Visit's Progress: On track  Recent Progress: On track  Priority: High  Note:   Current barriers:    Housing barriers Clinical Goals: Patient will work with SW and Care Guide to address needs related to housing options Clinical Interventions:   Wished pt a happy birthday! Pt reports she is uncertain if she has received the resources- reminded her they were emailed to her from the Care Guide.  She is now thinking she may not want to live alone but is still interested to seek info/resources.  Pt resides with her daughter and a grandson. "I have to share a room with my daughter". Pt reports she is independent, drives and would like to pursue housing options. Given pt's age; CSW suggested considering a senior apartment type setting. CSW will ask Care Guide to reach out to pt regarding housing options to consider.  Pt is not eligible for Medicaid and does not have LTC or VA  benefits.  Encouraged to review the material and follow up as she desires.  Collaboration with Tonia Ghent, MD regarding development and update of comprehensive plan of care as evidenced by provider attestation and co-signature Inter-disciplinary care team collaboration (see longitudinal plan of care) Assessment of needs, barriers , agencies contacted, as well as how impacting Review various resources, discussed options and provided patient information about  Housing resources (senior apartment type settings) Patient interviewed and appropriate assessments performed Referred patient to community resources care guide team for assistance with housing resources Solution-Focused Strategies, Active listening / Reflection utilized , and Problem [place]  Patient Goals/Self-Care Activities: Over the next 30 days -check your email for info sent for resources from [employer] - begin a notebook of services in my neighborhood or community - call 211 when I need some help - follow-up on any referrals for help I am given - think ahead to make sure my  need does not become an emergency - make a note about what I need to have by the phone or take with me, like an identification card or social security number have a back-up plan - have a back-up plan - make a list of family or friends that I can call         Follow Up Plan: Appointment scheduled for SW follow up with client by phone on: 03/04/21      Eduard Clos MSW, [place] Licensed Clinical Social Worker [place] [phone]

## 2021-02-12 NOTE — Patient Instructions (Signed)
Visit Information  PATIENT GOALS:  Goals Addressed               This Visit's Progress     Find Help in My Community (pt-stated)        Timeframe:  Short-Term Goal Priority:  High Start Date:  01/16/21                           Expected End Date:     03/12/21                  Follow Up Date 03/04/21   -check your email for info sent for resources from [employer] - begin a notebook of services in my neighborhood or community - call 211 when I need some help - follow-up on any referrals for help I am given - think ahead to make sure my need does not become an emergency - make a note about what I need to have by the phone or take with me, like an identification card or social security number have a back-up plan - have a back-up plan - make a list of family or friends that I can call   Why is this important?   Knowing how and where to find help for yourself or family in your neighborhood and community is an important skill.  You will want to take some steps to learn how.    Notes:         The patient verbalized understanding of instructions, educational materials, and care plan provided today and declined offer to receive copy of patient instructions, educational materials, and care plan.   Telephone follow up appointment with care management team member scheduled for:03/04/21  Eduard Clos MSW, [place] Licensed Clinical Social Worker [place] [phone]

## 2021-02-14 ENCOUNTER — Other Ambulatory Visit: Payer: Self-pay | Admitting: Family Medicine

## 2021-02-14 ENCOUNTER — Ambulatory Visit (INDEPENDENT_AMBULATORY_CARE_PROVIDER_SITE_OTHER): Payer: Medicare Other

## 2021-02-14 DIAGNOSIS — Z0000 Encounter for general adult medical examination without abnormal findings: Secondary | ICD-10-CM

## 2021-02-14 NOTE — Patient Instructions (Signed)
Health Maintenance, Female Adopting a healthy lifestyle and getting preventive care are important in promoting health and wellness. Ask your health care provider about: The right schedule for you to have regular tests and exams. Things you can do on your own to prevent diseases and keep yourself healthy. What should I know about diet, weight, and exercise? Eat a healthy diet  Eat a diet that includes plenty of vegetables, fruits, low-fat dairy products, and lean protein. Do not eat a lot of foods that are high in solid fats, added sugars, or sodium. Maintain a healthy weight Body mass index (BMI) is used to identify weight problems. It estimates body fat based on height and weight. Your health care provider can help determine your BMI and help you achieve or maintain a healthy weight. Get regular exercise Get regular exercise. This is one of the most important things you can do for your health. Most adults should: Exercise for at least 150 minutes each week. The exercise should increase your heart rate and make you sweat (moderate-intensity exercise). Do strengthening exercises at least twice a week. This is in addition to the moderate-intensity exercise. Spend less time sitting. Even light physical activity can be beneficial. Watch cholesterol and blood lipids Have your blood tested for lipids and cholesterol at 82 years of age, then have this test every 5 years. Have your cholesterol levels checked more often if: Your lipid or cholesterol levels are high. You are older than 82 years of age. You are at high risk for heart disease. What should I know about cancer screening? Depending on your health history and family history, you may need to have cancer screening at various ages. This may include screening for: Breast cancer. Cervical cancer. Colorectal cancer. Skin cancer. Lung cancer. What should I know about heart disease, diabetes, and high blood pressure? Blood pressure and heart  disease High blood pressure causes heart disease and increases the risk of stroke. This is more likely to develop in people who have high blood pressure readings, are of African descent, or are overweight. Have your blood pressure checked: Every 3-5 years if you are 18-39 years of age. Every year if you are 40 years old or older. Diabetes Have regular diabetes screenings. This checks your fasting blood sugar level. Have the screening done: Once every three years after age 40 if you are at a normal weight and have a low risk for diabetes. More often and at a younger age if you are overweight or have a high risk for diabetes. What should I know about preventing infection? Hepatitis B If you have a higher risk for hepatitis B, you should be screened for this virus. Talk with your health care provider to find out if you are at risk for hepatitis B infection. Hepatitis C Testing is recommended for: Everyone born from 1945 through 1965. Anyone with known risk factors for hepatitis C. Sexually transmitted infections (STIs) Get screened for STIs, including gonorrhea and chlamydia, if: You are sexually active and are younger than 82 years of age. You are older than 82 years of age and your health care provider tells you that you are at risk for this type of infection. Your sexual activity has changed since you were last screened, and you are at increased risk for chlamydia or gonorrhea. Ask your health care provider if you are at risk. Ask your health care provider about whether you are at high risk for HIV. Your health care provider may recommend a prescription medicine   to help prevent HIV infection. If you choose to take medicine to prevent HIV, you should first get tested for HIV. You should then be tested every 3 months for as long as you are taking the medicine. Pregnancy If you are about to stop having your period (premenopausal) and you may become pregnant, seek counseling before you get  pregnant. Take 400 to 800 micrograms (mcg) of folic acid every day if you become pregnant. Ask for birth control (contraception) if you want to prevent pregnancy. Osteoporosis and menopause Osteoporosis is a disease in which the bones lose minerals and strength with aging. This can result in bone fractures. If you are 65 years old or older, or if you are at risk for osteoporosis and fractures, ask your health care provider if you should: Be screened for bone loss. Take a calcium or vitamin D supplement to lower your risk of fractures. Be given hormone replacement therapy (HRT) to treat symptoms of menopause. Follow these instructions at home: Lifestyle Do not use any products that contain nicotine or tobacco, such as cigarettes, e-cigarettes, and chewing tobacco. If you need help quitting, ask your health care provider. Do not use street drugs. Do not share needles. Ask your health care provider for help if you need support or information about quitting drugs. Alcohol use Do not drink alcohol if: Your health care provider tells you not to drink. You are pregnant, may be pregnant, or are planning to become pregnant. If you drink alcohol: Limit how much you use to 0-1 drink a day. Limit intake if you are breastfeeding. Be aware of how much alcohol is in your drink. In the [place] one drink equals one 12 oz bottle of beer (355 mL), one 5 oz glass of wine (148 mL), or one 1 oz glass of hard liquor (44 mL). General instructions Schedule regular health, dental, and eye exams. Stay current with your vaccines. Tell your health care provider if: You often feel depressed. You have ever been abused or do not feel safe at home. Summary Adopting a healthy lifestyle and getting preventive care are important in promoting health and wellness. Follow your health care provider's instructions about healthy diet, exercising, and getting tested or screened for diseases. Follow your health care provider's  instructions on monitoring your cholesterol and blood pressure. This information is not intended to replace advice given to you by your health care provider. Make sure you discuss any questions you have with your health care provider. Document Revised: 08/08/2020 Document Reviewed: 05/24/2018 Elsevier Patient Education  2022 [employer]  

## 2021-02-14 NOTE — Progress Notes (Signed)
Subjective:   [name] is a 80 y.o. female who presents for Medicare Annual (Subsequent) preventive examination. I connected with  Kennon Holter on 02/14/21 by a audio enabled telemedicine application and verified that I am speaking with the correct person using two identifiers.   I discussed the limitations of evaluation and management by telemedicine. The patient expressed understanding and agreed to proceed.   Location of Patient: Home Location of Provider: Office  Persons participating in visit: Latrece Nitta and Cinda Quest, CMA Review of Systems     Cardiac Risk Factors include: diabetes mellitus;advanced age (>76mn, >>83women);hypertension     Objective:    There were no vitals filed for this visit. There is no height or weight on file to calculate BMI.  Advanced Directives 02/14/2021 10/09/2018 10/06/2017 01/05/2017 12/06/2016 09/28/2016 05/14/2016  Does Patient Have a Medical Advance Directive? Yes No Yes Yes Yes Yes Yes  Type of APrintmakerof AMount BlanchardLiving will - Living will [place] Does patient want to make changes to medical advance directive? No - Patient declined - - - - - No - Patient declined  Copy of [place] Chart? No - copy requested - No - copy requested - - No - copy requested No - copy requested  Would patient like information on creating a medical advance directive? No - Patient declined No - Patient declined - - - - -    Current Medications (verified) Outpatient Encounter Medications as of 02/14/2021  Medication Sig   ACCU-CHEK SOFTCLIX LANCETS lancets USE ONCE DAILY AS DIRECTED.  E11.9   amLODipine (NORVASC) 5 MG tablet Take 1 tablet (5 mg total) by mouth daily.   blood glucose meter kit and supplies Dispense based on patient and insurance preference. Use up to four times daily as directed. (FOR ICD-10 E10.9,  E11.9).   Blood Glucose Monitoring Suppl (ACCU-CHEK AVIVA PLUS) w/Device KIT Use as instructed to test blood sugar once daily or as directed.  Dx:  E11.9  Non-insulin dependent.   buPROPion (WELLBUTRIN SR) 100 MG 12 hr tablet Take 1 tablet (100 mg total) by mouth daily.   cholecalciferol (VITAMIN D) 1000 UNITS tablet Take 1,000 Units by mouth daily.   cyanocobalamin (,VITAMIN B-12,) 1000 MCG/ML injection INJECT 1 ML  INTRAMUSCULARLY MONTHLY   cyclobenzaprine (FLEXERIL) 10 MG tablet TAKE 1/2 TO 1 (ONE-HALF TO ONE) TABLET BY MOUTH TWICE DAILY AS NEEDED FOR MUSCLE SPASM (SEDATION CAUTION. SKIP LORAZEPAM WHEN USED)   fluticasone (FLONASE) 50 MCG/ACT nasal spray Place 2 sprays into both nostrils daily.   glucose blood (ACCU-CHEK AVIVA PLUS) test strip USE ONE STRIP TO CHECK GLUCOSE ONCE DAILY.  E11.9   Insulin Syringe-Needle U-100 (B-D INSULIN SYRINGE 1CC/25GX1") 25G X 1" 1 ML MISC Use for B12 injection   LORazepam (ATIVAN) 2 MG tablet 1/2 to 1 tab up to 4 times a day as needed for anxiety/insomnia.  Taper dose slowly.   metFORMIN (GLUCOPHAGE) 500 MG tablet Take 1 to 2 tablets by mouth twice a day with meals   metoprolol tartrate (LOPRESSOR) 50 MG tablet Take 1 tablet (50 mg total) by mouth 2 (two) times daily.   Omega-3 Fatty Acids (FISH OIL) 1000 MG CAPS Take 1,000 mg by mouth 2 (two) times daily.   omeprazole (PRILOSEC) 20 MG capsule TAKE 1 CAPSULE BY MOUTH TWICE DAILY AS NEEDED   sertraline (ZOLOFT) 50 MG tablet Take  3 tablets (150 mg total) by mouth daily.   No facility-administered encounter medications on file as of 02/14/2021.    Allergies (verified) Bisphosphonates, Diclofenac potassium, Naproxen sodium, Sulfonamide derivatives, Ezetimibe-simvastatin, and Simvastatin   History: Past Medical History:  Diagnosis Date   Anemia    Anxiety    Blood transfusion    Blood transfusion without reported diagnosis 1983   prbc   Breast cancer ([place]) 03/01/12   left breast lumpectomy=invasive ductal  ca,grade I/III,INVILVES SKELATAL MUSCLE   er=+,pr=NEG. HER2 NEG.   Cancer ([place]) 08/09/11 DX    LEFT   Depression    Diabetes mellitus 2011   type II, on pills    Diverticulosis    Dyspnea    with exertion if walks a long ways per patient    GERD (gastroesophageal reflux disease)    Headache    History of radiation therapy    RIGHT JAW 5 TXS FOR ABSESSED TOOTH IN HER 30'S    History of radiation therapy 04/10/12-05/26/12   left breast   Hyperlipidemia    Hypertension    Insomnia, unspecified    Osteoporosis, unspecified    with prev fosamax treatment   Other abnormal glucose    Personal history of radiation therapy    PONV (postoperative nausea and vomiting)    Past Surgical History:  Procedure Laterality Date   ABDOMINAL HYSTERECTOMY  1983   b/ls alpingooph orectomy 4 years later   [place] LUMPECTOMY Left 03/01/2012   LEFT=INVASIVE DUCTAL CA,gRADE i/iii,INVASION AT PSTERIOR RESECTION MARGIN AND INVLOVES SKE;ATAL MUSCLE,PERINEURAL INVASION IDENTIFIED   FRACTURE SURGERY     INGUINAL HERNIA REPAIR Left 1999   LIH   ORIF FEMUR FRACTURE Right    titanium rod   SIGMOIDOSCOPY     TUBAL LIGATION  1975   Family History  Problem Relation Age of Onset   Diabetes Mother    Alcohol abuse Father    Depression Sister    Arthritis Sister    Diabetes Sister    Parkinson's disease Sister    Hypertension Sister    Diabetes Sister    Cancer Brother    Bladder Cancer Brother    Cancer Brother    COPD Son    Anxiety disorder Son    Alcohol abuse Son    Bladder Cancer Son    Colon cancer Neg Hx    Esophageal cancer Neg Hx    Pancreatic cancer Neg Hx    Rectal cancer Neg Hx    Stomach cancer Neg Hx    Breast cancer Neg Hx    Social History   Socioeconomic History   Marital status: Widowed    Spouse name: Not on file   Number of children: 2   Years of education: Not on file   Highest education level: Not on file   Occupational History   Occupation: retired    Fish farm manager: Retired  Tobacco Use   Smoking status: Never   Smokeless tobacco: Never  Scientific laboratory technician Use: Never used  Substance and Sexual Activity   Alcohol use: No    Alcohol/week: 0.0 standard drinks   Drug use: No   Sexual activity: Not Currently    Birth control/protection: Post-menopausal    Comment: gxp2, pregnancy 1st child age 10, 2 step children, no hrt  Other Topics Concern   Not on file  Social History Narrative   Widowed 2014.    Living with  her daughter.     Social Determinants of Health   Financial Resource Strain: Medium Risk   Difficulty of Paying Living Expenses: Somewhat hard  Food Insecurity: No Food Insecurity   Worried About Charity fundraiser in the Last Year: Never true   Ran Out of Food in the Last Year: Never true  Transportation Needs: No Transportation Needs   Lack of Transportation (Medical): No   Lack of Transportation (Non-Medical): No  Physical Activity: Inactive   Days of Exercise per Week: 0 days   Minutes of Exercise per Session: 0 min  Stress: No Stress Concern Present   Feeling of Stress : Only a little  Social Connections: Moderately Isolated   Frequency of Communication with Friends and Family: More than three times a week   Frequency of Social Gatherings with Friends and Family: Once a week   Attends Religious Services: More than 4 times per year   Active Member of [employer] or Organizations: No   Attends Archivist Meetings: Never   Marital Status: Widowed    Tobacco Counseling Counseling given: Not Answered   Clinical Intake:     Pain : No/denies pain     Nutritional Status: BMI <19  Underweight Diabetes: Yes     Diabetic?yes         Activities of Daily Living In your present state of health, do you have any difficulty performing the following activities: 02/14/2021  Hearing? N  Vision? Y  Difficulty concentrating or making decisions? N  Walking or  climbing stairs? N  Dressing or bathing? N  Doing errands, shopping? N  Preparing Food and eating ? N  Using the Toilet? N  In the past six months, have you accidently leaked urine? N  Do you have problems with loss of bowel control? N  Managing your Medications? N  Managing your Finances? N  Housekeeping or managing your Housekeeping? N  Some recent data might be hidden    Patient Care Team: Tonia Ghent, MD as PCP - Huston Foley, MD as Consulting Physician (General Surgery) Alden Hipp, MD as Consulting Physician (Obstetrics and Gynecology) Irene Shipper, MD as Consulting Physician (Gastroenterology) Debbora Dus, [facility] as Pharmacist (Pharmacist) Deirdre Peer, LCSW as Social Worker (Licensed Clinical Social Worker)  Indicate any recent [employer] you may have received from other than [facility] providers in the past year (date may be approximate).     Assessment:   This is a routine wellness examination for [place].  Hearing/Vision screen No results found.  Dietary issues and exercise activities discussed: Current Exercise Habits: The patient does not participate in regular exercise at present, Exercise limited by: None identified   Goals Addressed   None    Depression Screen PHQ 2/9 Scores 02/14/2021 02/14/2021 08/08/2020 10/09/2018 10/06/2017 10/06/2017 09/28/2016  PHQ - 2 Score 1 1 1  0 0 0 2  PHQ- 9 Score - - 2 0 0 - 3    Fall Risk Fall Risk  02/14/2021 08/08/2020 10/09/2018 10/06/2017 10/06/2017  Falls in the past year? 1 0 0 Yes Yes  Comment - - - June 2018; broke arm after climbing up on barstool. stool turned over and she fell on top of arm -  Number falls in past yr: 0 0 - 1 1  Injury with Fall? 1 0 - Yes Yes  Risk for fall due to : No Fall Risks - - - -  Follow up Falls evaluation completed Falls evaluation completed - - -  FALL RISK PREVENTION PERTAINING TO THE HOME:  Any stairs in or around the home? No  If so, are there any without  handrails?  N/A Home free of loose throw rugs in walkways, pet beds, electrical cords, etc? No  Adequate lighting in your home to reduce risk of falls? Yes   ASSISTIVE DEVICES UTILIZED TO PREVENT FALLS:  Life alert? No  Use of a cane, walker or w/c? No  Grab bars in the bathroom? Yes  Shower chair or bench in shower? Yes  Elevated toilet seat or a handicapped toilet? No   TIMED UP AND GO:  Was the test performed?  N/A .  Length of time to ambulate 10 feet: N/A sec.   These questions cannot be answered via Telephone Encounter.  Cognitive Function: MMSE - Mini Mental State Exam 10/09/2018 10/06/2017 09/28/2016  Orientation to time 5 5 5   Orientation to Place 5 5 5   Registration 3 3 3   Attention/ Calculation 0 0 0  Recall 3 3 3   Language- name 2 objects 0 0 0  Language- repeat 1 1 1   Language- follow 3 step command 0 3 3  Language- read & follow direction 0 0 0  Write a sentence 0 0 0  Copy design 0 0 0  Total score 17 20 20      6CIT Screen 02/14/2021  What Year? 0 points  What month? 0 points  What time? 0 points  Count back from 20 0 points  Months in reverse 2 points  Repeat phrase 0 points  Total Score 2    Immunizations Immunization History  Administered Date(s) Administered   Influenza Split 03/30/2011   Influenza Whole 04/04/2009, 05/19/2010   Influenza, Seasonal, Injecte, Preservative Fre 05/24/2012   Influenza,inj,Quad PF,6+ Mos 03/28/2013, 02/14/2014, 08/04/2015, 03/20/2019   Influenza-Unspecified 05/08/2016   PFIZER(Purple Top)SARS-COV-2 Vaccination 09/28/2019, 10/22/2019   Pneumococcal Conjugate-13 01/21/2015   Pneumococcal Polysaccharide-23 06/15/2003, 05/28/2016   Td 07/13/2007    TDAP status: Due, Education has been provided regarding the importance of this vaccine. Advised may receive this vaccine at local pharmacy or Health Dept. Aware to provide a copy of the vaccination record if obtained from local pharmacy or Health Dept. Verbalized acceptance  and understanding.  Flu Vaccine status: Due, Education has been provided regarding the importance of this vaccine. Advised may receive this vaccine at local pharmacy or Health Dept. Aware to provide a copy of the vaccination record if obtained from local pharmacy or Health Dept. Verbalized acceptance and understanding.  Pneumococcal vaccine status: Due, Education has been provided regarding the importance of this vaccine. Advised may receive this vaccine at local pharmacy or Health Dept. Aware to provide a copy of the vaccination record if obtained from local pharmacy or Health Dept. Verbalized acceptance and understanding.  Covid-19 vaccine status: Information provided on how to obtain vaccines.   Qualifies for Shingles Vaccine? Yes   Zostavax completed No   Shingrix Completed?: No.    Education has been provided regarding the importance of this vaccine. Patient has been advised to call insurance company to determine out of pocket expense if they have not yet received this vaccine. Advised may also receive vaccine at local pharmacy or Health Dept. Verbalized acceptance and understanding.  Screening Tests Health Maintenance  Topic Date Due   Zoster Vaccines- Shingrix (1 of 2) Never done   TETANUS/TDAP  07/12/2017   URINE MICROALBUMIN  10/11/2019   COVID-19 Vaccine (3 - Pfizer risk series) 11/19/2019   INFLUENZA VACCINE  01/12/2021  OPHTHALMOLOGY EXAM  03/05/2021   HEMOGLOBIN A1C  06/18/2021   FOOT EXAM  12/16/2021   DEXA SCAN  Completed   PNA vac Low Risk Adult  Completed   HPV VACCINES  Aged Out    Health Maintenance  Health Maintenance Due  Topic Date Due   Zoster Vaccines- Shingrix (1 of 2) Never done   TETANUS/TDAP  07/12/2017   URINE MICROALBUMIN  10/11/2019   COVID-19 Vaccine (3 - Pfizer risk series) 11/19/2019   INFLUENZA VACCINE  01/12/2021    Colorectal cancer screening: No longer required.   Mammogram status: Ordered yes. Pt provided with contact info and advised  to call to schedule appt.   Bone Density status: Completed 03/01/2016. Results reflect: Bone density results: OSTEOPOROSIS. Repeat every 2 years.  Lung Cancer Screening: (Low Dose CT Chest recommended if Age 86-80 years, 30 pack-year currently smoking OR have quit w/in 15years.) does not qualify.   Lung Cancer Screening Referral: N/A  Additional Screening:  Hepatitis C Screening: does not qualify; Completed N/A  Vision Screening: Recommended annual ophthalmology exams for early detection of glaucoma and other disorders of the eye. Is the patient up to date with their annual eye exam?  Yes  Who is the provider or what is the name of the office in which the patient attends annual eye exams? [facility] in [place], [place] If pt is not established with a provider, would they like to be referred to a provider to establish care?  N/A .   Dental Screening: Recommended annual dental exams for proper oral hygiene  Community Resource Referral / Chronic Care Management: CRR required this visit?   N/A  CCM required this visit?  No      Plan:     I have personally reviewed and noted the following in the patient's chart:   Medical and social history Use of alcohol, tobacco or illicit drugs  Current medications and supplements including opioid prescriptions.  Functional ability and status Nutritional status Physical activity Advanced directives List of other physicians Hospitalizations, surgeries, and ER visits in previous 12 months Vitals Screenings to include cognitive, depression, and falls Referrals and appointments  In addition, I have reviewed and discussed with patient certain preventive protocols, quality metrics, and best practice recommendations. A written personalized care plan for preventive services as well as general preventive health recommendations were provided to patient.     Cinda Quest, [place]   02/14/2021   Nurse Notes: Non Face to Face 60 minute visit encounter   Ms.  Fraticelli , Thank you for taking time to come for your Medicare Wellness Visit. I appreciate your ongoing commitment to your health goals. Please review the following plan we discussed and let me know if I can assist you in the future.   These are the goals we discussed:  Goals       DIET - INCREASE WATER INTAKE      Starting 10/09/2018, I will attempt to drink at least 4-6 glasses of water daily.       Find Help in My Community (pt-stated)      Timeframe:  Short-Term Goal Priority:  High Start Date:  01/16/21                           Expected End Date:     03/12/21                  Follow Up Date 03/04/21   -  check your email for info sent for resources from [employer] - begin a notebook of services in my neighborhood or community - call 211 when I need some help - follow-up on any referrals for help I am given - think ahead to make sure my need does not become an emergency - make a note about what I need to have by the phone or take with me, like an identification card or social security number have a back-up plan - have a back-up plan - make a list of family or friends that I can call   Why is this important?   Knowing how and where to find help for yourself or family in your neighborhood and community is an important skill.  You will want to take some steps to learn how.    Notes:         This is a list of the screening recommended for you and due dates:  Health Maintenance  Topic Date Due   Zoster (Shingles) Vaccine (1 of 2) Never done   Tetanus Vaccine  07/12/2017   Urine Protein Check  10/11/2019   COVID-19 Vaccine (3 - Pfizer risk series) 11/19/2019   Flu Shot  01/12/2021   Eye exam for diabetics  03/05/2021   Hemoglobin A1C  06/18/2021   Complete foot exam   12/16/2021   DEXA scan (bone density measurement)  Completed   Pneumonia vaccines  Completed   HPV Vaccine  Aged Out

## 2021-02-16 DIAGNOSIS — E119 Type 2 diabetes mellitus without complications: Secondary | ICD-10-CM | POA: Diagnosis not present

## 2021-02-16 DIAGNOSIS — I10 Essential (primary) hypertension: Secondary | ICD-10-CM | POA: Diagnosis not present

## 2021-02-16 DIAGNOSIS — U071 COVID-19: Secondary | ICD-10-CM | POA: Diagnosis not present

## 2021-02-16 DIAGNOSIS — Z20822 Contact with and (suspected) exposure to covid-19: Secondary | ICD-10-CM | POA: Diagnosis not present

## 2021-02-17 NOTE — Telephone Encounter (Signed)
Refill request for lorazepam 2 mg tablets  LOV - 12/16/20 Next OV - not scheduled Last refill - 01/21/21 #120/0

## 2021-02-18 NOTE — Telephone Encounter (Signed)
Noted. Thanks.

## 2021-02-18 NOTE — Telephone Encounter (Addendum)
Sent. Thanks.  Please have patient slowly try to taper med by using 1/2 tab dosing when possible.

## 2021-02-18 NOTE — Telephone Encounter (Signed)
Patient advised.  Patient wanted to let us know also that she was tested positive for Covid on 02/16/2021 at Urgent Care in [facility]. Symptoms started on 02/14/21. Patient was started on Paxlovid. Sx are chills, headaches, cough-some better now, hoarse. Patient states she is doing a little better. No SOB.  Patient was advised to update [place] as needed on her symptoms and especially if she starts getting worse. FYI to Dr Damita Dunnings

## 2021-02-18 NOTE — Telephone Encounter (Signed)
Left message for patient to call back  

## 2021-02-19 ENCOUNTER — Other Ambulatory Visit: Payer: Self-pay | Admitting: Family Medicine

## 2021-03-04 ENCOUNTER — Telehealth: Payer: Medicare Other

## 2021-03-05 ENCOUNTER — Telehealth: Payer: Self-pay | Admitting: *Deleted

## 2021-03-05 NOTE — Telephone Encounter (Signed)
  Care Management   Follow Up Note   03/05/2021 Name: [name] MRN: [id number] DOB: 1941-05-28   Referred by: Tonia Ghent, MD Reason for referral : No chief complaint on file.   An unsuccessful telephone outreach was attempted today. The patient was referred to the case management team for assistance with care management and care coordination.   Follow Up Plan: The care management team will reach out to the patient again over the next 2 weeks .   Eduard Clos MSW, LCSW Licensed Clinical Social Worker [place] [phone]

## 2021-03-17 ENCOUNTER — Other Ambulatory Visit: Payer: Self-pay | Admitting: Family Medicine

## 2021-03-18 ENCOUNTER — Telehealth: Payer: Medicare Other

## 2021-03-18 NOTE — Telephone Encounter (Signed)
Refill request for Lorazepam 2 mg tablets  LOV - 12/16/20 Next OV - not scheduled Last refill - 02/18/21 #120/0

## 2021-03-18 NOTE — Telephone Encounter (Signed)
Sent. Thanks.   

## 2021-03-19 ENCOUNTER — Telehealth: Payer: Self-pay | Admitting: *Deleted

## 2021-03-19 NOTE — Telephone Encounter (Signed)
  Care Management   Follow Up Note   03/19/2021 Name: [name] MRN: [id number] DOB: 1941/06/12   Referred by: Tonia Ghent, MD Reason for referral : No chief complaint on file.   A second unsuccessful telephone outreach was attempted today. The patient was referred to the case management team for assistance with care management and care coordination.   Follow Up Plan: The care management team will reach out to the patient again over the next 7-10 days.   Eduard Clos MSW, LCSW Licensed Clinical Social Worker [place] [phone]

## 2021-03-20 NOTE — Chronic Care Management (AMB) (Signed)
  Care Management   Note  03/20/2021 Name: [name] MRN: [id number] DOB: 08-Jun-1941  [name] is a 80 y.o. year old female who is a primary care patient of Tonia Ghent, MD and is actively engaged with the care management team. I reached out to Kennon Holter by phone today to assist with re-scheduling a follow up visit with the Licensed Clinical Social Worker  Follow up plan: Telephone appointment with care management team member scheduled for: 04/01/2021  Julian Hy, [place], [place] Management  Direct Dial: [phone]

## 2021-04-01 ENCOUNTER — Ambulatory Visit (INDEPENDENT_AMBULATORY_CARE_PROVIDER_SITE_OTHER): Payer: Medicare Other | Admitting: *Deleted

## 2021-04-01 DIAGNOSIS — S6292XD Unspecified fracture of left wrist and hand, subsequent encounter for fracture with routine healing: Secondary | ICD-10-CM

## 2021-04-01 DIAGNOSIS — M549 Dorsalgia, unspecified: Secondary | ICD-10-CM

## 2021-04-01 DIAGNOSIS — E119 Type 2 diabetes mellitus without complications: Secondary | ICD-10-CM | POA: Diagnosis not present

## 2021-04-01 DIAGNOSIS — Z659 Problem related to unspecified psychosocial circumstances: Secondary | ICD-10-CM

## 2021-04-02 NOTE — Patient Instructions (Signed)
Visit Information  PATIENT GOALS:  Goals Addressed               This Visit's Progress     COMPLETED: Find Help in My Community (pt-stated)        Timeframe:  Short-Term Goal Priority:  High Start Date:  01/16/21                           Expected End Date:     03/12/21                  Follow Up Date 03/04/21   -check your email for info sent for resources from [employer] - begin a notebook of services in my neighborhood or community - call 211 when I need some help - follow-up on any referrals for help I am given - think ahead to make sure my need does not become an emergency - make a note about what I need to have by the phone or take with me, like an identification card or social security number have a back-up plan - have a back-up plan - make a list of family or friends that I can call   Why is this important?   Knowing how and where to find help for yourself or family in your neighborhood and community is an important skill.  You will want to take some steps to learn how.    Notes:         The patient verbalized understanding of instructions, educational materials, and care plan provided today and declined offer to receive copy of patient instructions, educational materials, and care plan.   No further follow up required:    Eduard Clos MSW, LCSW Licensed Clinical Social Worker [place] [phone]

## 2021-04-02 NOTE — Chronic Care Management (AMB) (Signed)
Chronic Care Management    Clinical Social Work Note  04/02/2021 Name: [name] MRN: [id number] DOB: 11-18-1940  [name] is a 81 y.o. year old female who is a primary care patient of Tonia Ghent, MD. The CCM team was consulted to assist the patient with chronic disease management and/or care coordination needs related to: [employer] .   Engaged with patient by telephone for initial visit in response to provider referral for social work chronic care management and care coordination services.   Consent to Services:  The patient was given information about Chronic Care Management services, agreed to services, and gave verbal consent prior to initiation of services.  Please see initial visit note for detailed documentation.   Patient agreed to services and consent obtained.   Assessment: Review of patient past medical history, allergies, medications, and health status, including review of relevant consultants reports was performed today as part of a comprehensive evaluation and provision of chronic care management and care coordination services.     SDOH (Social Determinants of Health) assessments and interventions performed:    Advanced Directives Status: Not addressed in this encounter.  CCM Care Plan  Allergies  Allergen Reactions   Bisphosphonates     Unclear relation to meds, but pt had long bone fracture after treatment with fosamax   Diclofenac Potassium     Not sure   Naproxen Sodium     Not sure   Sulfonamide Derivatives    Ezetimibe-Simvastatin     myalgias   Simvastatin     myalgias    Outpatient Encounter Medications as of 04/01/2021  Medication Sig   ACCU-CHEK SOFTCLIX LANCETS lancets USE ONCE DAILY AS DIRECTED.  E11.9   amLODipine (NORVASC) 5 MG tablet Take 1 tablet (5 mg total) by mouth daily.   blood glucose meter kit and supplies Dispense based on patient and insurance preference. Use up to four times daily as directed. (FOR ICD-10  E10.9, E11.9).   Blood Glucose Monitoring Suppl (ACCU-CHEK AVIVA PLUS) w/Device KIT Use as instructed to test blood sugar once daily or as directed.  Dx:  E11.9  Non-insulin dependent.   cholecalciferol (VITAMIN D) 1000 UNITS tablet Take 1,000 Units by mouth daily.   cyanocobalamin (,VITAMIN B-12,) 1000 MCG/ML injection INJECT 1 ML  INTRAMUSCULARLY MONTHLY   cyclobenzaprine (FLEXERIL) 10 MG tablet TAKE 1/2 TO 1 (ONE-HALF TO ONE) TABLET BY MOUTH TWICE DAILY AS NEEDED FOR MUSCLE SPASM (SEDATION CAUTION. SKIP LORAZEPAM WHEN USED)   fluticasone (FLONASE) 50 MCG/ACT nasal spray Place 2 sprays into both nostrils daily.   glucose blood (ACCU-CHEK AVIVA PLUS) test strip USE ONE STRIP TO CHECK GLUCOSE ONCE DAILY.  E11.9   Insulin Syringe-Needle U-100 (B-D INSULIN SYRINGE 1CC/25GX1") 25G X 1" 1 ML MISC Use for B12 injection   LORazepam (ATIVAN) 2 MG tablet TAKE ONE HALF TO ONE TABLET BY MOUTH UP TO 4 TIMES DAILY AS NEEDED FOR ANXIETY/INSOMNIA   metFORMIN (GLUCOPHAGE) 500 MG tablet Take 1 to 2 tablets by mouth twice a day with meals   metoprolol tartrate (LOPRESSOR) 50 MG tablet Take 1 tablet (50 mg total) by mouth 2 (two) times daily.   Omega-3 Fatty Acids (FISH OIL) 1000 MG CAPS Take 1,000 mg by mouth 2 (two) times daily.   omeprazole (PRILOSEC) 20 MG capsule TAKE 1 CAPSULE BY MOUTH TWICE DAILY AS NEEDED   sertraline (ZOLOFT) 50 MG tablet Take 3 tablets (150 mg total) by mouth daily.   No facility-administered encounter medications on  file as of 04/01/2021.    Patient Active Problem List   Diagnosis Date Noted   Closed left hand fracture 12/17/2020   Diarrhea 04/13/2020   Memory change 07/06/2019   B12 deficiency 07/06/2019   Sinus pressure 03/22/2019   Healthcare maintenance 10/16/2018   Back pain 10/16/2018   Community acquired pneumonia 03/29/2018   Left knee pain 09/30/2016   Hypertension    Colovaginal fistula s/p LAR rectosigmoid resection & colovaginal fistula repair/omentopexy 05/14/2016  05/14/2016   Back spasm 08/05/2015   Dysuria 08/05/2015   Advance care planning 14-Feb-2015   Sudden death of family member 02/20/2014   Medicare annual wellness visit, initial 10/08/2013   Chest wall pain 02/23/2012   Breast cancer of upper-inner quadrant of left female breast ([place]) 08/12/2011   Iron deficiency anemia 11/20/2010   SHOULDER PAIN, LEFT 07/03/2010   DM (diabetes mellitus) ([place]) 2010/02/20   HLD (hyperlipidemia) 01/27/2010   GERD 01/27/2010   Osteoporosis 01/27/2010   Insomnia 01/27/2010   Anxiety state 01/02/2010    Conditions to be addressed/monitored: Osteoporosis; Limited social support, Housing barriers, and Lacks knowledge of community resource:    Care Plan : LCSW Plan of Care  Updates made by Natalie Peer, LCSW since 04/02/2021 12:00 AM     Problem: Wants to move out on her own- level of care   Priority: High     Long-Range Goal: Quality of Life Maximized Completed 04/01/2021  Start Date: 01/16/2021  Expected End Date: 03/12/2021  Recent Progress: On track  Priority: High  Note:   Current barriers:    Housing barriers Clinical Goals: Patient will work with SW and Care Guide to address needs related to housing options Clinical Interventions:  Pt reports plans to not move out of the home she is in at this time- "I changed my mind". Pt reports things are better. Pt has info emailed to her for seeking housing options if she changes her mind and is reminded to reach out to [place] if her needs/wishes change.  Collaboration with Tonia Ghent, MD regarding development and update of comprehensive plan of care as evidenced by provider attestation and co-signature Inter-disciplinary care team collaboration (see longitudinal plan of care) Assessment of needs, barriers , agencies contacted, as well as how impacting Review various resources, discussed options and provided patient information about  Housing resources (senior apartment type settings) Patient  interviewed and appropriate assessments performed Referred patient to community resources care guide team for assistance with housing resources Solution-Focused Strategies, Active listening / Reflection utilized , and Problem [place]  Patient Goals/Self-Care Activities:   -check your email for info sent for resources from [employer] - begin a notebook of services in my neighborhood or community - call 211 when I need some help - follow-up on any referrals for help I am given - think ahead to make sure my need does not become an emergency - make a note about what I need to have by the phone or take with me, like an identification card or social security number have a back-up plan - have a back-up plan - make a list of family or friends that I can call         Follow Up Plan: Client will follow up if further needs arise      [name] MSW, LCSW Licensed Clinical Social Worker [place] [phone]

## 2021-04-11 ENCOUNTER — Other Ambulatory Visit: Payer: Self-pay | Admitting: Family Medicine

## 2021-04-20 ENCOUNTER — Other Ambulatory Visit: Payer: Self-pay | Admitting: Family Medicine

## 2021-04-20 NOTE — Telephone Encounter (Signed)
Refill request for LORazepam (ATIVAN) 2 MG tablet  LOV - 12/16/20 Next OV - not scheduled Last refill - 03/18/21 #120/0

## 2021-04-21 NOTE — Telephone Encounter (Signed)
Called Ms. Simones and left vm to scheduled CPE

## 2021-04-21 NOTE — Telephone Encounter (Signed)
Needs yearly visit scheduled for early 2023.  Rx sent.  Thanks.

## 2021-04-23 NOTE — Telephone Encounter (Signed)
3RD ATTEMPT CALL AND LEFT VM AND SENT [facility]

## 2021-05-14 ENCOUNTER — Other Ambulatory Visit: Payer: Self-pay | Admitting: Family Medicine

## 2021-05-20 ENCOUNTER — Other Ambulatory Visit: Payer: Self-pay | Admitting: Family Medicine

## 2021-05-21 NOTE — Telephone Encounter (Signed)
Refill request for LORazepam (ATIVAN) 2 MG tablet  LOV - 12/16/20 Next OV - not scheduled Last refill - 04/21/21 #120/0

## 2021-05-22 NOTE — Telephone Encounter (Signed)
Sent. Thanks.   

## 2021-05-29 DIAGNOSIS — M25551 Pain in right hip: Secondary | ICD-10-CM | POA: Diagnosis not present

## 2021-05-29 DIAGNOSIS — R0902 Hypoxemia: Secondary | ICD-10-CM | POA: Diagnosis not present

## 2021-05-29 DIAGNOSIS — R29890 Loss of height: Secondary | ICD-10-CM | POA: Diagnosis not present

## 2021-05-29 DIAGNOSIS — W010XXA Fall on same level from slipping, tripping and stumbling without subsequent striking against object, initial encounter: Secondary | ICD-10-CM | POA: Diagnosis not present

## 2021-05-29 DIAGNOSIS — R6889 Other general symptoms and signs: Secondary | ICD-10-CM | POA: Diagnosis not present

## 2021-05-29 DIAGNOSIS — M79651 Pain in right thigh: Secondary | ICD-10-CM | POA: Diagnosis not present

## 2021-05-29 DIAGNOSIS — Z743 Need for continuous supervision: Secondary | ICD-10-CM | POA: Diagnosis not present

## 2021-05-29 DIAGNOSIS — Y998 Other external cause status: Secondary | ICD-10-CM | POA: Diagnosis not present

## 2021-05-29 DIAGNOSIS — W19XXXA Unspecified fall, initial encounter: Secondary | ICD-10-CM | POA: Diagnosis not present

## 2021-05-29 DIAGNOSIS — M8588 Other specified disorders of bone density and structure, other site: Secondary | ICD-10-CM | POA: Diagnosis not present

## 2021-05-29 DIAGNOSIS — S32020A Wedge compression fracture of second lumbar vertebra, initial encounter for closed fracture: Secondary | ICD-10-CM | POA: Diagnosis not present

## 2021-05-29 DIAGNOSIS — M549 Dorsalgia, unspecified: Secondary | ICD-10-CM | POA: Diagnosis not present

## 2021-05-29 DIAGNOSIS — M545 Low back pain, unspecified: Secondary | ICD-10-CM | POA: Diagnosis not present

## 2021-06-01 ENCOUNTER — Telehealth: Payer: Self-pay | Admitting: Family Medicine

## 2021-06-01 NOTE — Telephone Encounter (Signed)
Pt (daughter) [name] called about her mother. Her mother [name]) fell last week, went to hospital. Needing refill on hydrocodine However I didn't see that prescription on pt med list.

## 2021-06-01 NOTE — Telephone Encounter (Signed)
Spoke with daughter Otis Peak and she stated that patient was taken to [facility] on 05/29/21. Records of visit are on care everywhere. They prescribed patient the hydrocodone 5-325 mg tablets and patient will be out tomorrow. She needs a refill sent in. Does have an ortho appt set up for either later in the week or next week; [name] could not remember at the moment. I was going to try to scheduled a hospital f/u appt as well but there are no appts until 06/12/21.

## 2021-06-02 DIAGNOSIS — R82998 Other abnormal findings in urine: Secondary | ICD-10-CM | POA: Diagnosis not present

## 2021-06-02 DIAGNOSIS — R410 Disorientation, unspecified: Secondary | ICD-10-CM | POA: Diagnosis not present

## 2021-06-02 DIAGNOSIS — N3000 Acute cystitis without hematuria: Secondary | ICD-10-CM | POA: Diagnosis not present

## 2021-06-02 MED ORDER — HYDROCODONE-ACETAMINOPHEN 5-325 MG PO TABS: 1.0000 | ORAL_TABLET | Freq: Four times a day (QID) | ORAL | 0 refills | Status: DC | PRN

## 2021-06-02 NOTE — Telephone Encounter (Signed)
[name] called back to speak with [name].

## 2021-06-02 NOTE — Addendum Note (Signed)
Addended by: Tonia Ghent on: 06/02/2021 08:56 AM   Modules accepted: Orders

## 2021-06-02 NOTE — Telephone Encounter (Signed)
Left message for [name] that rx was sent in.

## 2021-06-02 NOTE — Telephone Encounter (Signed)
Noted.  Sent.  Thanks. 

## 2021-06-10 DIAGNOSIS — S32021D Stable burst fracture of second lumbar vertebra, subsequent encounter for fracture with routine healing: Secondary | ICD-10-CM | POA: Diagnosis not present

## 2021-06-10 DIAGNOSIS — S32020A Wedge compression fracture of second lumbar vertebra, initial encounter for closed fracture: Secondary | ICD-10-CM | POA: Diagnosis not present

## 2021-06-18 DIAGNOSIS — S32020A Wedge compression fracture of second lumbar vertebra, initial encounter for closed fracture: Secondary | ICD-10-CM | POA: Diagnosis not present

## 2021-06-26 DIAGNOSIS — M8458XA Pathological fracture in neoplastic disease, other specified site, initial encounter for fracture: Secondary | ICD-10-CM | POA: Diagnosis not present

## 2021-06-26 DIAGNOSIS — E119 Type 2 diabetes mellitus without complications: Secondary | ICD-10-CM | POA: Diagnosis not present

## 2021-06-26 DIAGNOSIS — S32020A Wedge compression fracture of second lumbar vertebra, initial encounter for closed fracture: Secondary | ICD-10-CM | POA: Diagnosis not present

## 2021-06-26 DIAGNOSIS — I10 Essential (primary) hypertension: Secondary | ICD-10-CM | POA: Diagnosis not present

## 2021-06-26 DIAGNOSIS — M8008XA Age-related osteoporosis with current pathological fracture, vertebra(e), initial encounter for fracture: Secondary | ICD-10-CM | POA: Diagnosis not present

## 2021-07-03 ENCOUNTER — Other Ambulatory Visit: Payer: Self-pay | Admitting: Family Medicine

## 2021-07-03 NOTE — Telephone Encounter (Signed)
Last Filled 810-22 #120 Last OV with Damita Dunnings 12-16-20 No Future OV [facility] [address] [address] [facility]

## 2021-07-05 NOTE — Telephone Encounter (Signed)
Please schedule DM2 f/u when possible.  We can do labs at the visit.  Doesn't need to fast.  Thanks.

## 2021-07-06 NOTE — Telephone Encounter (Signed)
Called pt and lvmtcb.

## 2021-07-07 NOTE — Telephone Encounter (Signed)
Called & lvmtcb

## 2021-07-28 DIAGNOSIS — S32029A Unspecified fracture of second lumbar vertebra, initial encounter for closed fracture: Secondary | ICD-10-CM | POA: Diagnosis not present

## 2021-07-28 DIAGNOSIS — M47816 Spondylosis without myelopathy or radiculopathy, lumbar region: Secondary | ICD-10-CM | POA: Diagnosis not present

## 2021-07-28 DIAGNOSIS — S32020A Wedge compression fracture of second lumbar vertebra, initial encounter for closed fracture: Secondary | ICD-10-CM | POA: Diagnosis not present

## 2021-07-28 DIAGNOSIS — R29890 Loss of height: Secondary | ICD-10-CM | POA: Diagnosis not present

## 2021-08-04 ENCOUNTER — Other Ambulatory Visit: Payer: Self-pay | Admitting: Family Medicine

## 2021-08-04 DIAGNOSIS — M545 Low back pain, unspecified: Secondary | ICD-10-CM | POA: Diagnosis not present

## 2021-08-04 DIAGNOSIS — S32020A Wedge compression fracture of second lumbar vertebra, initial encounter for closed fracture: Secondary | ICD-10-CM | POA: Diagnosis not present

## 2021-08-04 DIAGNOSIS — Z9889 Other specified postprocedural states: Secondary | ICD-10-CM | POA: Diagnosis not present

## 2021-08-04 NOTE — Telephone Encounter (Signed)
Refill request for LORazepam 2 MG Oral Tablet  LOV - 12/16/20 Next OV - not scheduled Last refill - 07/05/21 #120/0

## 2021-08-05 NOTE — Telephone Encounter (Signed)
Sent. Thanks.  Needs f/u OV scheduled.

## 2021-08-24 ENCOUNTER — Telehealth: Payer: Self-pay

## 2021-08-24 NOTE — Progress Notes (Signed)
? ? ?  Chronic Care Management ?Pharmacy Assistant  ? ?Name: [name]  MRN: [id number] DOB: 1941/01/14 ? ?Reason for Encounter: CCM (Initial Appointment Reminder) ? ?Medications: ?Outpatient Encounter Medications as of 08/24/2021  ?Medication Sig  ? ACCU-CHEK SOFTCLIX LANCETS lancets USE ONCE DAILY AS DIRECTED.  E11.9  ? amLODipine (NORVASC) 5 MG tablet Take 1 tablet by mouth once daily  ? blood glucose meter kit and supplies Dispense based on patient and insurance preference. Use up to four times daily as directed. (FOR ICD-10 E10.9, E11.9).  ? Blood Glucose Monitoring Suppl (ACCU-CHEK AVIVA PLUS) w/Device KIT Use as instructed to test blood sugar once daily or as directed.  Dx:  E11.9  Non-insulin dependent.  ? buPROPion (WELLBUTRIN SR) 100 MG 12 hr tablet Take 1 tablet (100 mg total) by mouth daily.  ? cholecalciferol (VITAMIN D) 1000 UNITS tablet Take 1,000 Units by mouth daily.  ? cyanocobalamin (,VITAMIN B-12,) 1000 MCG/ML injection INJECT 1 ML INTRAMUSCULARLY  ONCE EVERY MONTH  ? cyclobenzaprine (FLEXERIL) 10 MG tablet TAKE 1/2 TO 1 (ONE-HALF TO ONE) TABLET BY MOUTH TWICE DAILY AS NEEDED FOR MUSCLE SPASM (SEDATION CAUTION. SKIP LORAZEPAM WHEN USED)  ? fluticasone (FLONASE) 50 MCG/ACT nasal spray Place 2 sprays into both nostrils daily.  ? glucose blood (ACCU-CHEK AVIVA PLUS) test strip USE ONE STRIP TO CHECK GLUCOSE ONCE DAILY.  E11.9  ? HYDROcodone-acetaminophen (NORCO/VICODIN) 5-325 MG tablet Take 1 tablet by mouth every 6 (six) hours as needed for moderate pain.  ? Insulin Syringe-Needle U-100 (B-D INSULIN SYRINGE 1CC/25GX1") 25G X 1" 1 ML MISC Use for B12 injection  ? LORazepam (ATIVAN) 2 MG tablet TAKE 1/2 TO 1 TABLET BY MOUTH 4 TIMES DAILY AS NEEDED FOR ANXIETY/INSOMNIA  ? metFORMIN (GLUCOPHAGE) 500 MG tablet Take 1 to 2 tablets by mouth twice a day with meals  ? metoprolol tartrate (LOPRESSOR) 50 MG tablet Take 1 tablet (50 mg total) by mouth 2 (two) times daily.  ? Omega-3 Fatty Acids (FISH OIL)  1000 MG CAPS Take 1,000 mg by mouth 2 (two) times daily.  ? omeprazole (PRILOSEC) 20 MG capsule TAKE 1 CAPSULE BY MOUTH TWICE DAILY AS NEEDED  ? sertraline (ZOLOFT) 50 MG tablet Take 3 tablets (150 mg total) by mouth daily.  ? ?No facility-administered encounter medications on file as of 08/24/2021.  ? ?[name] was contacted to remind of upcoming telephone visit with Charlene [name] on 08/27/2021 at 9:30 am. Patient was reminded to have any blood glucose and blood pressure readings available for review at appointment. If unable to reach, a voicemail was left for patient.  ? ?Star Rating Drugs: ?Medication:  Last Fill: Day Supply ?Metformin 500 mg 03/21/2021 90 ?Fill date verified with [facility] ? ?Charlene [name], CPP notified ? ?Marijean Niemann, RMA ?Clinical Pharmacy Assistant ?[phone] ? ? ? ? ? ?

## 2021-08-27 ENCOUNTER — Telehealth: Payer: Medicare Other

## 2021-09-01 DIAGNOSIS — M545 Low back pain, unspecified: Secondary | ICD-10-CM | POA: Diagnosis not present

## 2021-09-01 DIAGNOSIS — S32020A Wedge compression fracture of second lumbar vertebra, initial encounter for closed fracture: Secondary | ICD-10-CM | POA: Diagnosis not present

## 2021-09-01 DIAGNOSIS — S32020D Wedge compression fracture of second lumbar vertebra, subsequent encounter for fracture with routine healing: Secondary | ICD-10-CM | POA: Diagnosis not present

## 2021-09-02 ENCOUNTER — Other Ambulatory Visit: Payer: Self-pay | Admitting: Family Medicine

## 2021-09-03 NOTE — Telephone Encounter (Signed)
Refill request for Lorazepam 2 mg tabs  ? ?LOV - 12/16/20 ?Next OV - not scheduled ?Last refill - 08/05/21 #120/0 ? ?*Refill also needed for B12 injections ? ?

## 2021-09-04 NOTE — Telephone Encounter (Signed)
Sent.  Needs DM2 f/u set.  Thanks.  ?

## 2021-10-01 ENCOUNTER — Other Ambulatory Visit: Payer: Self-pay

## 2021-10-01 MED ORDER — "BD INSULIN SYRINGE 25G X 1"" 1 ML MISC": 0 refills | Status: DC

## 2021-10-07 ENCOUNTER — Other Ambulatory Visit: Payer: Self-pay | Admitting: Family Medicine

## 2021-10-07 NOTE — Telephone Encounter (Signed)
Refill request for LORazepam 2 MG Oral Tablet ? ?LOV - 12/16/20 ?Next OV - not scheduled ?Last refill - 09/04/21 #120/0 ? ?

## 2021-10-22 NOTE — Telephone Encounter (Signed)
Lvm for pt to call back and schedule.  

## 2021-10-27 ENCOUNTER — Encounter: Payer: Self-pay | Admitting: Family Medicine

## 2021-10-27 ENCOUNTER — Ambulatory Visit (INDEPENDENT_AMBULATORY_CARE_PROVIDER_SITE_OTHER): Payer: Medicare Other | Admitting: Family Medicine

## 2021-10-27 VITALS — BP 124/80 | Temp 98.6°F | Ht 60.0 in | Wt 127.8 lb

## 2021-10-27 DIAGNOSIS — J209 Acute bronchitis, unspecified: Secondary | ICD-10-CM | POA: Insufficient documentation

## 2021-10-27 DIAGNOSIS — J029 Acute pharyngitis, unspecified: Secondary | ICD-10-CM

## 2021-10-27 LAB — POC INFLUENZA A&B (BINAX/QUICKVUE)
Influenza A, POC: NEGATIVE
Influenza B, POC: NEGATIVE

## 2021-10-27 LAB — POC COVID19 BINAXNOW: SARS Coronavirus 2 Ag: NEGATIVE

## 2021-10-27 MED ORDER — DOXYCYCLINE HYCLATE 100 MG PO TABS: 100.0000 mg | ORAL_TABLET | Freq: Two times a day (BID) | ORAL | 0 refills | Status: DC

## 2021-10-27 MED ORDER — PREDNISONE 10 MG PO TABS: ORAL_TABLET | ORAL | 0 refills | Status: DC

## 2021-10-27 NOTE — Assessment & Plan Note (Signed)
Some rhonchi on exam but no sob  ?Px prednisone 30 mg taper (reviewed possible side effects including elevated glucose and jitterienss)  ?Doxycycline bid for 7d ?Fluids/rest and sympt care ?Suggest expectorant prn  ? ?Update if not starting to improve in a week or if worsening   ?ER parameters reviewed  ? ?

## 2021-10-27 NOTE — Patient Instructions (Addendum)
Take the prednisone as directed for wheezing and bronchitis  ?Take the doxycycline as directed  ? ?It may make you feel hyper and hungry  ?It will also elevate blood sugar =so watch that  ? ?Let us know if you cannot get the glucose meter working  ? ? ?You can try robitussin or mucinex for cough as needed  ? ?Take care of yourself  ?Drink lots of fluids  ? ? ? ? ? ? ? ?

## 2021-10-27 NOTE — Progress Notes (Signed)
? ?Subjective:  ? ? Patient ID: [name], female    DOB: 1940-11-25, 81 y.o.   MRN: [id number] ? ?HPI ?81 yo pt of Dr Damita Dunnings presents with cough and body aches ? ?Wt Readings from Last 3 Encounters:  ?10/27/21 127 lb 12.8 oz (58 kg)  ?12/16/20 125 lb (56.7 kg)  ?08/08/20 128 lb (58.1 kg)  ? ?24.96 kg/m? ? ?Started a few days ago  ?Nose was runny to start and then a hoarse voice  ? ?Productive cough -yellow mucous  ?Some wheezing /daughter could hear it  ?Not sob  ?ST sat night/ she gargled with some salt water and it helped some  ? ?Chills but no fever  ?Aches ?Some headache  ? ?A little better today  ? ?Otc: ?Excedrin for headache  ?Cough drops  ? ?Is diabetic  ? ? ?Results for orders placed or performed in visit on 10/27/21  ?POC Influenza A&B(BINAX/QUICKVUE)  ?Result Value Ref Range  ? Influenza A, POC Negative Negative  ? Influenza B, POC Negative Negative  ?[place] COVID-19  ?Result Value Ref Range  ? SARS Coronavirus 2 Ag Negative Negative  ?  ?Patient Active Problem List  ? Diagnosis Date Noted  ? Acute bronchitis 10/27/2021  ? Closed left hand fracture 12/17/2020  ? Diarrhea 04/13/2020  ? Memory change 07/06/2019  ? B12 deficiency 07/06/2019  ? Sinus pressure 03/22/2019  ? Healthcare maintenance 10/16/2018  ? Back pain 10/16/2018  ? Left knee pain 09/30/2016  ? Hypertension   ? Colovaginal fistula s/p LAR rectosigmoid resection & colovaginal fistula repair/omentopexy 05/14/2016 05/14/2016  ? Back spasm 08/05/2015  ? Dysuria 08/05/2015  ? Advance care planning 02-06-15  ? Sudden death of family member 12-Feb-2014  ? Medicare annual wellness visit, initial 10/08/2013  ? Chest wall pain 02/23/2012  ? Breast cancer of upper-inner quadrant of left female breast ([place]) 08/12/2011  ? Iron deficiency anemia 11/20/2010  ? SHOULDER PAIN, LEFT 07/03/2010  ? DM (diabetes mellitus) ([place]) 02/12/10  ? HLD (hyperlipidemia) 01/27/2010  ? GERD 01/27/2010  ? Osteoporosis 01/27/2010  ? Insomnia 01/27/2010  ? Anxiety state  01/02/2010  ? ?Past Medical History:  ?Diagnosis Date  ? Anemia   ? Anxiety   ? Blood transfusion   ? Blood transfusion without reported diagnosis 1983  ? prbc  ? Breast cancer ([place]) 03/01/12  ? left breast lumpectomy=invasive ductal ca,grade I/III,INVILVES SKELATAL MUSCLE   er=+,pr=NEG. HER2 NEG.  ? Cancer ([place]) 08/09/11 DX  ?  LEFT  ? Depression   ? Diabetes mellitus 2011  ? type II, on pills   ? Diverticulosis   ? Dyspnea   ? with exertion if walks a long ways per patient   ? GERD (gastroesophageal reflux disease)   ? Headache   ? History of radiation therapy   ? RIGHT JAW 5 TXS FOR ABSESSED TOOTH IN HER 30'S   ? History of radiation therapy 04/10/12-05/26/12  ? left breast  ? Hyperlipidemia   ? Hypertension   ? Insomnia, unspecified   ? Osteoporosis, unspecified   ? with prev fosamax treatment  ? Other abnormal glucose   ? Personal history of radiation therapy   ? PONV (postoperative nausea and vomiting)   ? ?Past Surgical History:  ?Procedure Laterality Date  ? ABDOMINAL HYSTERECTOMY  1983  ? b/ls alpingooph orectomy 4 years later  ? BILATERAL OOPHORECTOMY  1987  ? BREAST BIOPSY    ? BREAST LUMPECTOMY Left 03/01/2012  ? LEFT=INVASIVE DUCTAL CA,gRADE i/iii,INVASION AT [employer]  RESECTION MARGIN AND INVLOVES SKE;ATAL MUSCLE,PERINEURAL INVASION IDENTIFIED  ? FRACTURE SURGERY    ? INGUINAL HERNIA REPAIR Left 1999  ? [facility]  ? ORIF FEMUR FRACTURE Right   ? titanium rod  ? SIGMOIDOSCOPY    ? TUBAL LIGATION  1975  ? ?Social History  ? ?Tobacco Use  ? Smoking status: Never  ? Smokeless tobacco: Never  ?Vaping Use  ? Vaping Use: Never used  ?Substance Use Topics  ? Alcohol use: No  ?  Alcohol/week: 0.0 standard drinks  ? Drug use: No  ? ?Family History  ?Problem Relation Age of Onset  ? Diabetes Mother   ? Alcohol abuse Father   ? Depression Sister   ? Arthritis Sister   ? Diabetes Sister   ? Parkinson's disease Sister   ? Hypertension Sister   ? Diabetes Sister   ? Cancer Brother   ? Bladder Cancer Brother   ? Cancer Brother    ? COPD Son   ? Anxiety disorder Son   ? Alcohol abuse Son   ? Bladder Cancer Son   ? Colon cancer Neg Hx   ? Esophageal cancer Neg Hx   ? Pancreatic cancer Neg Hx   ? Rectal cancer Neg Hx   ? Stomach cancer Neg Hx   ? Breast cancer Neg Hx   ? ?Allergies  ?Allergen Reactions  ? Bisphosphonates   ?  Unclear relation to meds, but pt had long bone fracture after treatment with fosamax  ? Diclofenac Potassium   ?  Not sure  ? Naproxen Sodium   ?  Not sure  ? Sulfonamide Derivatives   ? Ezetimibe-Simvastatin   ?  myalgias  ? Simvastatin   ?  myalgias  ? ?Current Outpatient Medications on File Prior to Visit  ?Medication Sig Dispense Refill  ? ACCU-CHEK SOFTCLIX LANCETS lancets USE ONCE DAILY AS DIRECTED.  E11.9 100 each 3  ? amLODipine (NORVASC) 5 MG tablet Take 1 tablet by mouth once daily 90 tablet 0  ? blood glucose meter kit and supplies Dispense based on patient and insurance preference. Use up to four times daily as directed. (FOR ICD-10 E10.9, E11.9). 1 each 0  ? Blood Glucose Monitoring Suppl (ACCU-CHEK AVIVA PLUS) w/Device KIT Use as instructed to test blood sugar once daily or as directed.  Dx:  E11.9  Non-insulin dependent. 1 kit 0  ? cholecalciferol (VITAMIN D) 1000 UNITS tablet Take 1,000 Units by mouth daily.    ? cyanocobalamin (,VITAMIN B-12,) 1000 MCG/ML injection INJECT 1 ML  INTRAMUSCULARLY ONCE EVERY MONTH 3 mL 0  ? cyclobenzaprine (FLEXERIL) 10 MG tablet TAKE 1/2 TO 1 (ONE-HALF TO ONE) TABLET BY MOUTH TWICE DAILY AS NEEDED FOR MUSCLE SPASM (SEDATION CAUTION. SKIP LORAZEPAM WHEN USED) 30 tablet 3  ? fluticasone (FLONASE) 50 MCG/ACT nasal spray Place 2 sprays into both nostrils daily.    ? glucose blood (ACCU-CHEK AVIVA PLUS) test strip USE ONE STRIP TO CHECK GLUCOSE ONCE DAILY.  E11.9 100 each 3  ? HYDROcodone-acetaminophen (NORCO/VICODIN) 5-325 MG tablet Take 1 tablet by mouth every 6 (six) hours as needed for moderate pain. 20 tablet 0  ? Insulin Syringe-Needle U-100 (B-D INSULIN SYRINGE  1CC/25GX1") 25G X 1" 1 ML MISC Use to inject monthly B12 injection 1 each 0  ? LORazepam (ATIVAN) 2 MG tablet TAKE 1/2 TO 1 (ONE-HALF TO ONE) TABLET BY MOUTH 4 TIMES DAILY AS NEEDED FOR ANXIETY/INSOMNIA 120 tablet 0  ? metFORMIN (GLUCOPHAGE) 500 MG tablet Take 1 to  2 tablets by mouth twice a day with meals 304 tablet 2  ? metoprolol tartrate (LOPRESSOR) 50 MG tablet Take 1 tablet (50 mg total) by mouth 2 (two) times daily. 180 tablet 3  ? Omega-3 Fatty Acids (FISH OIL) 1000 MG CAPS Take 1,000 mg by mouth 2 (two) times daily.    ? omeprazole (PRILOSEC) 20 MG capsule TAKE 1 CAPSULE BY MOUTH TWICE DAILY AS NEEDED 180 capsule 3  ? sertraline (ZOLOFT) 50 MG tablet Take 3 tablets (150 mg total) by mouth daily. 132 tablet 1  ? buPROPion (WELLBUTRIN SR) 100 MG 12 hr tablet Take 1 tablet (100 mg total) by mouth daily. 90 tablet 1  ? ?No current facility-administered medications on file prior to visit.  ?  ? ?Review of Systems  ?Constitutional:  Positive for appetite change and fatigue. Negative for fever.  ?HENT:  Positive for congestion, postnasal drip, rhinorrhea and sore throat. Negative for ear pain, sinus pressure and sneezing.   ?Eyes:  Negative for pain and discharge.  ?Respiratory:  Positive for cough and wheezing. Negative for chest tightness, shortness of breath and stridor.   ?Cardiovascular:  Negative for chest pain.  ?Gastrointestinal:  Negative for diarrhea, nausea and vomiting.  ?Genitourinary:  Negative for frequency, hematuria and urgency.  ?Musculoskeletal:  Negative for arthralgias and myalgias.  ?Skin:  Negative for rash.  ?Neurological:  Positive for headaches. Negative for dizziness, weakness and light-headedness.  ?Psychiatric/Behavioral:  Negative for confusion and dysphoric mood.   ? ?   ?Objective:  ? Physical Exam ?Constitutional:   ?   General: She is not in acute distress. ?   Appearance: Normal appearance. She is well-developed and normal weight. She is not ill-appearing, toxic-appearing or  diaphoretic.  ?HENT:  ?   Head: Normocephalic and atraumatic.  ?   Comments: Nares are injected and congested   ? ?No facial tenderness ?   Right Ear: Tympanic membrane, ear canal and external ear normal.  ?   Left

## 2021-10-28 ENCOUNTER — Telehealth: Payer: Self-pay | Admitting: Family Medicine

## 2021-10-28 NOTE — Telephone Encounter (Signed)
Patient has called the office, appointment is set for 5.19.23 ?

## 2021-10-28 NOTE — Telephone Encounter (Signed)
Please set up OV with patient re: memory eval when possible.  Thanks.  ?

## 2021-10-28 NOTE — Telephone Encounter (Signed)
I have attempted without success to contact this patient by phone to I left a message on answering machine, for pt call us back to make an appt with Dr.Duncan.  ?

## 2021-10-30 ENCOUNTER — Ambulatory Visit: Payer: Medicare Other | Admitting: Family Medicine

## 2021-11-06 ENCOUNTER — Other Ambulatory Visit: Payer: Self-pay | Admitting: Family Medicine

## 2021-11-06 NOTE — Telephone Encounter (Signed)
Refill request for LORazepam 2 MG Oral Tablet  LOV - 10/27/21 Next OV - not scheduled Last refill - 10/07/21 #120/0

## 2021-11-09 NOTE — Telephone Encounter (Signed)
Given her situation, please call pt/family to set f/u here.  Thanks.

## 2021-11-11 NOTE — Telephone Encounter (Signed)
Patient notified, needs to get with daughter for transportation.

## 2021-11-20 ENCOUNTER — Other Ambulatory Visit: Payer: Self-pay | Admitting: Family Medicine

## 2021-12-07 ENCOUNTER — Other Ambulatory Visit: Payer: Self-pay | Admitting: Family Medicine

## 2021-12-10 ENCOUNTER — Telehealth: Payer: Self-pay | Admitting: Family Medicine

## 2021-12-10 NOTE — Telephone Encounter (Signed)
Refill request for LORazepam 2 MG Oral Tablet  LOV - 10/27/21 Next OV - not scheduled Last refill - 11/09/21 #120/0

## 2021-12-11 NOTE — Telephone Encounter (Signed)
Patient called to follow up on this because she is completely out.

## 2021-12-11 NOTE — Telephone Encounter (Signed)
She needs to schedule follow up here.  Please schedule.  Rx sent.  Thanks.

## 2021-12-11 NOTE — Telephone Encounter (Signed)
Please call patient to set up f/u with Dr. Damita Dunnings when possible.

## 2021-12-14 NOTE — Telephone Encounter (Signed)
Patient is scheduled   

## 2021-12-15 ENCOUNTER — Other Ambulatory Visit: Payer: Self-pay | Admitting: Family Medicine

## 2021-12-25 ENCOUNTER — Encounter: Payer: Self-pay | Admitting: Family Medicine

## 2021-12-25 ENCOUNTER — Ambulatory Visit (INDEPENDENT_AMBULATORY_CARE_PROVIDER_SITE_OTHER): Payer: Medicare Other | Admitting: Family Medicine

## 2021-12-25 VITALS — BP 110/74 | HR 68 | Temp 97.8°F | Ht 60.0 in | Wt 125.0 lb

## 2021-12-25 DIAGNOSIS — F411 Generalized anxiety disorder: Secondary | ICD-10-CM | POA: Diagnosis not present

## 2021-12-25 DIAGNOSIS — R0789 Other chest pain: Secondary | ICD-10-CM | POA: Diagnosis not present

## 2021-12-25 DIAGNOSIS — M549 Dorsalgia, unspecified: Secondary | ICD-10-CM | POA: Diagnosis not present

## 2021-12-25 DIAGNOSIS — G720 Drug-induced myopathy: Secondary | ICD-10-CM

## 2021-12-25 DIAGNOSIS — M810 Age-related osteoporosis without current pathological fracture: Secondary | ICD-10-CM | POA: Diagnosis not present

## 2021-12-25 DIAGNOSIS — E538 Deficiency of other specified B group vitamins: Secondary | ICD-10-CM | POA: Diagnosis not present

## 2021-12-25 DIAGNOSIS — E119 Type 2 diabetes mellitus without complications: Secondary | ICD-10-CM | POA: Diagnosis not present

## 2021-12-25 MED ORDER — CYANOCOBALAMIN 1000 MCG/ML IJ SOLN: INTRAMUSCULAR | 3 refills | Status: DC

## 2021-12-25 MED ORDER — LORAZEPAM 2 MG PO TABS: 2.0000 mg | ORAL_TABLET | Freq: Three times a day (TID) | ORAL | 0 refills | Status: DC | PRN

## 2021-12-25 MED ORDER — ACCU-CHEK AVIVA PLUS VI STRP: ORAL_STRIP | 3 refills | Status: AC

## 2021-12-25 MED ORDER — BLOOD GLUCOSE METER KIT: PACK | 0 refills | Status: AC

## 2021-12-25 MED ORDER — "BD INSULIN SYRINGE 25G X 1"" 1 ML MISC": 1 refills | Status: AC

## 2021-12-25 MED ORDER — ACCU-CHEK SOFTCLIX LANCETS MISC: 3 refills | Status: AC

## 2021-12-25 NOTE — Patient Instructions (Addendum)
Go to the lab on the way out.   If you have mychart we'll likely use that to update you.    Take care.  Glad to see you. Let me know if you can't get a sugar meter.  Practice deep breaths with bracing your ribs.

## 2021-12-25 NOTE — Progress Notes (Unsigned)
Anxiety d/w pt. Fall hx d/w pt.  Fell day before yesterday.  Wasn't lightheaded. L rib pain in the meantime.  No LOC.  Prev L hip pain resolved. Ribs sore with deep breath.  Didn't bump her head.  Family helped her up.  Mood d/w pt.  D/w pt about BZD taper, slowly.    Still on B12 injection.  Needs refill.  Injection done at home.    H/o compression fracture.  Recheck Vit D pending.    Diabetes:  Using medications without difficulties: metformin 2 in AM and 1 in PM.   Hypoglycemic episodes: no sx Hyperglycemic episodes: no sx Feet problems:no Blood Sugars averaging: not checked, needs a new meter.   eye exam within last year: due, d/w pt.   Statin intolerant.    Family noted repetition, occ lapses.    Meds, vitals, and allergies reviewed.  ROS: Per HPI unless specifically indicated in ROS section   GEN: nad, alert and oriented HEENT: ncat NECK: supple w/o LA CV: rrr. PULM: ctab, no inc wob, L anterior inferior ribs ttp but no focal dec in BS ABD: soft, +bs EXT: no edema SKIN: no acute rash  Diabetic foot exam: Normal inspection No skin breakdown No calluses  Normal DP pulses normal sensation to light touch but dec (not absent) to monofilament Nails normal

## 2021-12-26 LAB — LIPID PANEL
Cholesterol: 166 mg/dL (ref <200)
HDL: 36 mg/dL — ABNORMAL LOW (ref >=50)
LDL Cholesterol (Calc): 103 mg/dL (calc) — ABNORMAL HIGH
Non-HDL Cholesterol (Calc): 130 mg/dL (calc) — ABNORMAL HIGH (ref <130)
Total CHOL/HDL Ratio: 4.6 (calc) (ref <5.0)
Triglycerides: 152 mg/dL — ABNORMAL HIGH (ref <150)

## 2021-12-26 LAB — TSH: TSH: 1.85 mIU/L (ref 0.40–4.50)

## 2021-12-26 LAB — CBC WITH DIFFERENTIAL/PLATELET
Absolute Monocytes: 781 cells/uL (ref 200–950)
Basophils Absolute: 61 cells/uL (ref 0–200)
Basophils Relative: 0.5 %
Eosinophils Absolute: 85 cells/uL (ref 15–500)
Eosinophils Relative: 0.7 %
HCT: 40.1 % (ref 35.0–45.0)
Hemoglobin: 13.2 g/dL (ref 11.7–15.5)
Lymphs Abs: 744 cells/uL — ABNORMAL LOW (ref 850–3900)
MCH: 29.9 pg (ref 27.0–33.0)
MCHC: 32.9 g/dL (ref 32.0–36.0)
MCV: 90.7 fL (ref 80.0–100.0)
MPV: 11.5 fL (ref 7.5–12.5)
Monocytes Relative: 6.4 %
Neutro Abs: 10529 cells/uL — ABNORMAL HIGH (ref 1500–7800)
Neutrophils Relative %: 86.3 %
Platelets: 240 10*3/uL (ref 140–400)
RBC: 4.42 10*6/uL (ref 3.80–5.10)
RDW: 12.8 % (ref 11.0–15.0)
Total Lymphocyte: 6.1 %
WBC: 12.2 10*3/uL — ABNORMAL HIGH (ref 3.8–10.8)

## 2021-12-26 LAB — HEMOGLOBIN A1C
Hgb A1c MFr Bld: 7 % of total Hgb — ABNORMAL HIGH (ref <5.7)
Mean Plasma Glucose: 154 mg/dL
eAG (mmol/L): 8.5 mmol/L

## 2021-12-26 LAB — COMPREHENSIVE METABOLIC PANEL
AG Ratio: 1.3 (calc) (ref 1.0–2.5)
ALT: 10 U/L (ref 6–29)
AST: 14 U/L (ref 10–35)
Albumin: 4.3 g/dL (ref 3.6–5.1)
Alkaline phosphatase (APISO): 61 U/L (ref 37–153)
BUN: 15 mg/dL (ref 7–25)
CO2: 23 mmol/L (ref 20–32)
Calcium: 10.1 mg/dL (ref 8.6–10.4)
Chloride: 102 mmol/L (ref 98–110)
Creat: 0.87 mg/dL (ref 0.60–0.95)
Globulin: 3.3 g/dL (calc) (ref 1.9–3.7)
Glucose, Bld: 214 mg/dL — ABNORMAL HIGH (ref 65–99)
Potassium: 4.1 mmol/L (ref 3.5–5.3)
Sodium: 138 mmol/L (ref 135–146)
Total Bilirubin: 0.7 mg/dL (ref 0.2–1.2)
Total Protein: 7.6 g/dL (ref 6.1–8.1)

## 2021-12-26 LAB — VITAMIN B12: Vitamin B-12: 460 pg/mL (ref 200–1100)

## 2021-12-26 LAB — VITAMIN D 25 HYDROXY (VIT D DEFICIENCY, FRACTURES): Vit D, 25-Hydroxy: 31 ng/mL (ref 30–100)

## 2021-12-27 DIAGNOSIS — G720 Drug-induced myopathy: Secondary | ICD-10-CM | POA: Insufficient documentation

## 2021-12-27 NOTE — Assessment & Plan Note (Signed)
With history of compression fracture noted.  Recheck vitamin D pending.

## 2021-12-27 NOTE — Assessment & Plan Note (Addendum)
Discussed bracing while taking deep breaths.  We talked about getting an x-ray but there is no expectation that it would change the plan since she is afebrile and her lungs are clear.  Discussed.  She agreed to defer.  Can use Tylenol for pain.

## 2021-12-27 NOTE — Assessment & Plan Note (Signed)
See notes on labs. 

## 2021-12-27 NOTE — Assessment & Plan Note (Signed)
Statin intolerant 

## 2021-12-27 NOTE — Assessment & Plan Note (Signed)
See notes on labs.  Statin intolerant.  Continue metformin.

## 2021-12-27 NOTE — Assessment & Plan Note (Signed)
Continue sertraline.  Taper lorazepam.  Low prescription sent for lorazepam.

## 2022-01-28 ENCOUNTER — Other Ambulatory Visit: Payer: Self-pay | Admitting: Family Medicine

## 2022-02-05 ENCOUNTER — Telehealth: Payer: Self-pay | Admitting: Family Medicine

## 2022-02-05 ENCOUNTER — Other Ambulatory Visit: Payer: Self-pay | Admitting: Family Medicine

## 2022-02-05 MED ORDER — OMEPRAZOLE 20 MG PO CPDR: DELAYED_RELEASE_CAPSULE | ORAL | 3 refills | Status: DC

## 2022-02-05 MED ORDER — SERTRALINE HCL 50 MG PO TABS: 150.0000 mg | ORAL_TABLET | Freq: Every day | ORAL | 0 refills | Status: DC

## 2022-02-05 NOTE — Telephone Encounter (Signed)
Erxs sent 

## 2022-02-05 NOTE — Telephone Encounter (Signed)
  Encourage patient to contact the pharmacy for refills or they can request refills through [facility]  Did the patient contact the pharmacy:y     LAST APPOINTMENT DATE:  Please schedule appointment if longer than 1 year  NEXT APPOINTMENT DATE:04/19/22  MEDICATION:sertraline (ZOLOFT) 50 MG tablet  omeprazole (PRILOSEC) 20 MG capsule  Is the patient out of medication?n   If not, how much is left? Out of zoloft 1 wk left of prilosec  Is this a 90 day supply: y  PHARMACY: [facility], [place] Phone:  [phone]  Fax:  [phone]      Let patient know to contact pharmacy at the end of the day to make sure medication is ready.  Please notify patient to allow 48-72 hours to process

## 2022-02-05 NOTE — Telephone Encounter (Signed)
LOV- 12/25/21 NOV - 04/19/22 RF - 12/25/21 #90/0

## 2022-02-05 NOTE — Telephone Encounter (Signed)
  Encourage patient to contact the pharmacy for refills or they can request refills through [facility]  Did the patient contact the pharmacy:  y   LAST APPOINTMENT DATE 12/25/21  NEXT APPOINTMENT DATE:  MEDICATION:sertraline (ZOLOFT) 50 MG tablet omeprazole (PRILOSEC) 20 MG capsule  Is the patient out of medication? Out of zoloft,have 1 wk  left of prilosec  If not, how much is left?  Is this a 38 day supply: y  PHARMACY: [facility], [place] Phone:  [phone]  Fax:  [phone]      Let patient know to contact pharmacy at the end of the day to make sure medication is ready.  Please notify patient to allow 48-72 hours to process

## 2022-02-07 NOTE — Telephone Encounter (Signed)
I declined the refill.  According to the EMR she got 90 pills filled on 01/20/2022.  She should not need a refill until next month and she should be tapering this medication down in the meantime.  Thanks.

## 2022-02-09 ENCOUNTER — Telehealth: Payer: Self-pay | Admitting: Family Medicine

## 2022-02-09 NOTE — Telephone Encounter (Signed)
LVM for pt to rtn my call to schedule AWV with [facility] call back # [phone] 

## 2022-02-16 ENCOUNTER — Other Ambulatory Visit: Payer: Self-pay | Admitting: Family Medicine

## 2022-02-17 NOTE — Telephone Encounter (Addendum)
Refill request Lorazepam Last refill 01/20/22 #90 Last office visit 12/25/21

## 2022-02-17 NOTE — Telephone Encounter (Signed)
Sent. Thanks.   

## 2022-03-07 ENCOUNTER — Other Ambulatory Visit: Payer: Self-pay | Admitting: Family Medicine

## 2022-03-11 ENCOUNTER — Other Ambulatory Visit: Payer: Self-pay | Admitting: Family Medicine

## 2022-03-11 ENCOUNTER — Telehealth: Payer: Self-pay | Admitting: Family Medicine

## 2022-03-11 NOTE — Telephone Encounter (Signed)
Pt's daughter Otis Peak called wanting to speak to Dr. Damita Dunnings about taking gardianship of pt's income. Call back # [phone]

## 2022-03-12 NOTE — Telephone Encounter (Signed)
Spoke with patients daughter about below. She is going to speak with her brother about what needs to be done and will call back to move patients nov appt up to oct.

## 2022-03-12 NOTE — Telephone Encounter (Signed)
LMTCB

## 2022-03-12 NOTE — Telephone Encounter (Signed)
This usually comes through a Chief Executive Officer.  If there is a medical question I can answer, then please let me know.  Specifically, if the patient is having move memory loss or another medical issue that limits the patient's ability to make decisions, then she would need OV about that.  Thanks.

## 2022-03-21 ENCOUNTER — Telehealth: Payer: Self-pay | Admitting: Family Medicine

## 2022-03-22 NOTE — Telephone Encounter (Signed)
Refill request for LORazepam 2 MG Oral Tablet  LOV - 12/25/21 Next OV - 04/19/22 Last refill - 02/17/22 #90/0

## 2022-03-24 NOTE — Telephone Encounter (Signed)
Called patient and notified rx was sent in yesterday

## 2022-03-24 NOTE — Telephone Encounter (Signed)
Patient called in to follow up on this prescription.

## 2022-03-24 NOTE — Telephone Encounter (Signed)
Sent yesterday to [facility], [place]  [place], [place] [address]  Phone:  [phone]  Fax:  [phone]

## 2022-03-26 MED ORDER — LORAZEPAM 2 MG PO TABS
ORAL_TABLET | ORAL | 0 refills | Status: DC
Start: 2022-03-26 — End: 2022-03-29

## 2022-03-26 NOTE — Telephone Encounter (Signed)
Patient called in and stated that [facility] is out of the medication. She would like for it to be put in to [facility],  - [address] AT [place]. Thank you!

## 2022-03-26 NOTE — Telephone Encounter (Signed)
Sent. Thanks.  Let me know if she can't get it filled.

## 2022-03-26 NOTE — Telephone Encounter (Signed)
Patient notified rx was resent to request pharmacy.

## 2022-03-26 NOTE — Addendum Note (Signed)
Addended by: Tonia Ghent on: 03/26/2022 01:53 PM   Modules accepted: Orders

## 2022-03-29 MED ORDER — LORAZEPAM 2 MG PO TABS: ORAL_TABLET | ORAL | 0 refills | Status: DC

## 2022-03-29 NOTE — Telephone Encounter (Signed)
Patient notified

## 2022-03-29 NOTE — Telephone Encounter (Signed)
Sent. Thanks.   

## 2022-03-29 NOTE — Addendum Note (Signed)
Addended by: Tonia Ghent on: 03/29/2022 02:06 PM   Modules accepted: Orders

## 2022-03-29 NOTE — Telephone Encounter (Signed)
Pt called in stated Pharmacy do not have RX LORazepam (ATIVAN) 2 MG tablet . Wants to know if it can be called in to [facility] . Please advise [phone]

## 2022-04-15 ENCOUNTER — Other Ambulatory Visit: Payer: Self-pay | Admitting: Family Medicine

## 2022-04-19 ENCOUNTER — Ambulatory Visit (INDEPENDENT_AMBULATORY_CARE_PROVIDER_SITE_OTHER): Payer: Medicare Other | Admitting: Family Medicine

## 2022-04-19 ENCOUNTER — Encounter: Payer: Self-pay | Admitting: Family Medicine

## 2022-04-19 VITALS — BP 144/72 | HR 63 | Temp 97.5°F | Ht 60.0 in | Wt 123.0 lb

## 2022-04-19 DIAGNOSIS — E119 Type 2 diabetes mellitus without complications: Secondary | ICD-10-CM

## 2022-04-19 DIAGNOSIS — Z23 Encounter for immunization: Secondary | ICD-10-CM | POA: Diagnosis not present

## 2022-04-19 DIAGNOSIS — F411 Generalized anxiety disorder: Secondary | ICD-10-CM

## 2022-04-19 LAB — POCT GLYCOSYLATED HEMOGLOBIN (HGB A1C): Hemoglobin A1C: 6.9 % — AB (ref 4.0–5.6)

## 2022-04-19 MED ORDER — METFORMIN HCL 500 MG PO TABS: 500.0000 mg | ORAL_TABLET | Freq: Two times a day (BID) | ORAL | 2 refills | Status: DC

## 2022-04-19 MED ORDER — METFORMIN HCL 500 MG PO TABS: 500.0000 mg | ORAL_TABLET | Freq: Two times a day (BID) | ORAL | Status: DC

## 2022-04-19 NOTE — Progress Notes (Unsigned)
Diabetes:  Using medications without difficulties: 2 metformin in the AM and 1 in the PM.   Hypoglycemic episodes: not checked  Hyperglycemic episodes: not checked  Feet problems: no Blood Sugars averaging: not checked  A1c done at [facility].  D/w pt.  eye exam within last year: due, dw pt.    Anxiety d/w pt.  Still on sertraline at baseline.  Had used BZD prn.  Some days with less/more use than others.  Family had noted occ short term memory changes-this is not a new issue.  Discussed tapering benzodiazepine gradually.    Meds, vitals, and allergies reviewed.   ROS: Per HPI unless specifically indicated in ROS section   GEN: nad, alert and oriented HEENT: mucous membranes moist NECK: supple w/o LA CV: rrr. PULM: ctab, no inc wob ABD: soft, +bs EXT: no edema SKIN: no acute rash  30 minutes were devoted to patient care in this encounter (this includes time spent reviewing the patient's file/history, interviewing and examining the patient, counseling/reviewing plan with patient).

## 2022-04-19 NOTE — Patient Instructions (Addendum)
Keep trying to gradually taper your lorazepam by a 1/2 tab.   Take care.  Glad to see you. Call about an eye exam when you can.  Plan on recheck in about 4 months.  Labs at the visit.   Try cutting back to 1 metformin twice a day.

## 2022-04-21 MED ORDER — LORAZEPAM 2 MG PO TABS: ORAL_TABLET | ORAL | 0 refills | Status: DC

## 2022-04-21 NOTE — Assessment & Plan Note (Signed)
Continue sertraline, gradually taper lorazepam.  Routine cautions discussed with patient and family.  Update me as needed.

## 2022-04-21 NOTE — Assessment & Plan Note (Signed)
A1c discussed with patient. Plan on recheck in about 4 months.  Labs at the visit.   Try cutting back to 1 metformin twice a day.  I do not want to induce hypoglycemia.  She agrees with plan.

## 2022-04-27 ENCOUNTER — Other Ambulatory Visit: Payer: Self-pay | Admitting: Family Medicine

## 2022-04-30 ENCOUNTER — Encounter: Payer: Self-pay | Admitting: Family Medicine

## 2022-04-30 ENCOUNTER — Telehealth: Payer: Self-pay

## 2022-04-30 MED ORDER — LORAZEPAM 2 MG PO TABS: ORAL_TABLET | ORAL | 0 refills | Status: DC

## 2022-04-30 NOTE — Telephone Encounter (Signed)
Patient called in to request this refill request.    Encourage patient to contact the pharmacy for refills or they can request refills through [facility]  Did the patient contact the pharmacy: Yes  LAST APPOINTMENT DATE:  04/19/2022  NEXT APPOINTMENT DATE: 08/19/2022  MEDICATION: LORazepam (ATIVAN) 2 MG tablet   Is the patient out of medication? Yes  PHARMACY: [facility] - [place], [place] - [address]   Let patient know to contact pharmacy at the end of the day to make sure medication is ready.  Please notify patient to allow 48-72 hours to process

## 2022-04-30 NOTE — Telephone Encounter (Signed)
error 

## 2022-04-30 NOTE — Telephone Encounter (Signed)
Patient notified rx has been resent to correct pharmacy.

## 2022-04-30 NOTE — Telephone Encounter (Signed)
Sent. Thanks.   

## 2022-04-30 NOTE — Telephone Encounter (Signed)
[facility] Night - Client TELEPHONE ADVICE RECORD [facility] Patient Name: [name] [facility] Gender: Female DOB: 01-06-1941 Age: 81 Y 2 M 15 D Return Phone Number: [phone] (Primary) Address: City/ State/ Zip: [place]  [address] Client [facility] Night - Client Client Site [place] - Night Provider Renford Dills - MD Contact Type Call Who Is Calling Patient / Member / Family / Caregiver Call Type Triage / Clinical Relationship To Patient Self Return Phone Number [phone] (Primary) Chief Complaint Prescription Refill or Medication Request (non symptomatic) Reason for Call Medication Question / Request Initial Comment Caller states she was supposed to get her script yesterday and she has not heard anything. Translation No Nurse Assessment Nurse: Idolina Primer, RN, Tiffani Date/Time (Eastern Time): 04/29/2022 5:44:24 PM Confirm and document reason for call. If symptomatic, describe symptoms. ---Caller states she was supposed to get her script yesterday and she has not heard anything. Denies new or worsening symptoms. Does the patient have any new or worsening symptoms? ---No Nurse: Idolina Primer, RN, Tiffani Date/Time (Eastern Time): 04/29/2022 5:44:41 PM Please select the assessment type ---Request for controlled medication refill Is there an on-call physician for the client? ---Yes Do the client directives specifically allow for paging the on-call regarding scheduled drugs? ---No Additional Documentation ---Needs rx for lorazepam Disp. Time Eilene Ghazi Time) Disposition Final User 04/29/2022 5:46:41 PM Clinical Call Yes Idolina Primer, RN, Tiffani Final Disposition 04/29/2022 5:46:41 PM Clinical Call Yes Dunn, RN, Tiffani Comments User: Brantley Fling, RN Date/Time Eilene Ghazi Time): 04/29/2022 5:46:30 PM PLEASE NOTE: All timestamps contained within this report are represented as [place] Standard Time. CONFIDENTIALTY  NOTICE: This fax transmission is intended only for the addressee. It contains information that is legally privileged, confidential or otherwise protected from use or disclosure. If you are not the intended recipient, you are strictly prohibited from reviewing, disclosing, copying using or disseminating any of this information or taking any action in reliance on or regarding this information. If you have received this fax in error, please notify us immediately by telephone so that we can arrange for its return to [place]. Phone: [phone], Toll-Free: [phone], Fax: [phone] Page: 2 of 2 Call Id: [id number] Comments Advised caller to call first thing tomorrow morning for request of lorazepam rx. Office opens at 8 am. Caller verbalized understanding

## 2022-05-18 NOTE — Progress Notes (Signed)
[facility] Quality Team Note  Name: DENECIA BRUNETTE Date of Birth: Aug 01, 1940 MRN: [id number] Date: 05/18/2022  [facility] Quality Team has reviewed this patient's chart, please see recommendations below:  [facility] Quality Other; Pt has open quality gap for KED, needs Micro/Creat Urine test and eGFR blood test to close this. Please address at next visit.

## 2022-05-27 ENCOUNTER — Other Ambulatory Visit: Payer: Self-pay | Admitting: Family Medicine

## 2022-05-27 NOTE — Telephone Encounter (Signed)
Refill request for LORazepam (ATIVAN) 2 MG tablet   LOV - 04/19/22 Next OV - 08/19/22 Last refill - 04/30/22 #90/0

## 2022-05-28 NOTE — Telephone Encounter (Signed)
LMTCB

## 2022-05-28 NOTE — Telephone Encounter (Signed)
Which pharmacy is she using?  Please let me know.  Thanks.

## 2022-05-31 NOTE — Telephone Encounter (Signed)
Spoke with patient and [facility] is the correct pharmacy to send rx to

## 2022-06-09 ENCOUNTER — Other Ambulatory Visit: Payer: Self-pay | Admitting: Family Medicine

## 2022-06-15 ENCOUNTER — Other Ambulatory Visit: Payer: Self-pay | Admitting: Family Medicine

## 2022-06-18 ENCOUNTER — Other Ambulatory Visit: Payer: Self-pay | Admitting: Family Medicine

## 2022-07-02 ENCOUNTER — Telehealth: Payer: Self-pay | Admitting: Family Medicine

## 2022-07-02 MED ORDER — LORAZEPAM 2 MG PO TABS: ORAL_TABLET | ORAL | 0 refills | Status: DC

## 2022-07-02 NOTE — Telephone Encounter (Signed)
LOV - 04/19/22 NOV - 08/19/22 RF -06/01/22 #90/0

## 2022-07-02 NOTE — Telephone Encounter (Signed)
Prescription Request  07/02/2022  Is this a "Controlled Substance" medicine? No  LOV: 04/19/2022  What is the name of the medication or equipment? LORazepam (ATIVAN) 2 MG tablet   Have you contacted your pharmacy to request a refill? No   Which pharmacy would you like this sent to?   [facility] - [place], [place] - [address] [place] [address] Phone: [phone] Fax: [phone]    Patient notified that their request is being sent to the clinical staff for review and that they should receive a response within 2 business days.   Please advise at [facility] [phone]

## 2022-07-02 NOTE — Addendum Note (Signed)
Addended by: Tonia Ghent on: 07/02/2022 05:11 PM   Modules accepted: Orders

## 2022-07-28 ENCOUNTER — Other Ambulatory Visit: Payer: Self-pay | Admitting: Family Medicine

## 2022-07-28 NOTE — Telephone Encounter (Signed)
Refill request for LORAZEPAM 2 MG TAB   LOV - 04/19/22 Next OV - 08/19/22 Last refill - 07/02/22 #90/0

## 2022-07-30 ENCOUNTER — Other Ambulatory Visit: Payer: Self-pay | Admitting: Family Medicine

## 2022-08-03 DIAGNOSIS — Z1231 Encounter for screening mammogram for malignant neoplasm of breast: Secondary | ICD-10-CM | POA: Diagnosis not present

## 2022-08-05 ENCOUNTER — Ambulatory Visit: Payer: Medicare Other

## 2022-08-19 ENCOUNTER — Ambulatory Visit: Payer: Medicare Other | Admitting: Family Medicine

## 2022-08-31 ENCOUNTER — Other Ambulatory Visit: Payer: Self-pay | Admitting: Family Medicine

## 2022-08-31 NOTE — Telephone Encounter (Signed)
Called and scheduled patient 4/12 lab and 4/19 awv.

## 2022-08-31 NOTE — Telephone Encounter (Signed)
Please call patient to set up her wellness visit with Dr. Damita Dunnings.

## 2022-08-31 NOTE — Telephone Encounter (Signed)
Refill request for LORAZEPAM 2 MG TAB   LOV - 04/19/22 Next OV - not scheduled Last refill - 07/28/22 #90/0

## 2022-09-03 ENCOUNTER — Other Ambulatory Visit: Payer: Self-pay

## 2022-09-03 MED ORDER — SERTRALINE HCL 50 MG PO TABS: 150.0000 mg | ORAL_TABLET | Freq: Every day | ORAL | 2 refills | Status: DC

## 2022-09-07 ENCOUNTER — Ambulatory Visit (INDEPENDENT_AMBULATORY_CARE_PROVIDER_SITE_OTHER): Payer: Medicare Other

## 2022-09-07 VITALS — Ht 60.0 in | Wt 122.0 lb

## 2022-09-07 DIAGNOSIS — Z780 Asymptomatic menopausal state: Secondary | ICD-10-CM | POA: Diagnosis not present

## 2022-09-07 DIAGNOSIS — Z0000 Encounter for general adult medical examination without abnormal findings: Secondary | ICD-10-CM

## 2022-09-07 NOTE — Progress Notes (Signed)
I connected with  Kennon Holter on 09/07/22 by a audio enabled telemedicine application and verified that I am speaking with the correct person using two identifiers.  Patient Location: Home  Provider Location: Office/Clinic  I discussed the limitations of evaluation and management by telemedicine. The patient expressed understanding and agreed to proceed.  Subjective:   [name] is a 82 y.o. female who presents for Medicare Annual (Subsequent) preventive examination.  Review of Systems      Cardiac Risk Factors include: advanced age (>73men, >43 women);sedentary lifestyle;hypertension;diabetes mellitus     Objective:    Today's Vitals   09/07/22 1503  Weight: 122 lb (55.3 kg)  Height: 5' (1.524 m)   Body mass index is 23.83 kg/m.     09/07/2022    3:20 PM 02/14/2021   12:13 PM 10/09/2018    3:39 PM 10/06/2017   12:42 PM 01/05/2017    2:21 PM 12/06/2016    8:06 PM 09/28/2016   11:35 AM  Advanced Directives  Does Patient Have a Medical Advance Directive? Yes Yes No Yes Yes Yes Yes  Type of Paramedic of [place];Living will Healthcare Power of [place];Living will  Living will [place];Living will  Does patient want to make changes to medical advance directive?  No - Patient declined       Copy of [place] in Chart? No - copy requested No - copy requested  No - copy requested   No - copy requested  Would patient like information on creating a medical advance directive?  No - Patient declined No - Patient declined        Current Medications (verified) Outpatient Encounter Medications as of 09/07/2022  Medication Sig   Accu-Chek Softclix Lancets lancets USE ONCE DAILY AS DIRECTED.  E11.9   amLODipine (NORVASC) 5 MG tablet Take 1 tablet by mouth once daily   blood glucose meter kit and supplies Dispense based on patient and insurance preference. Use up to four times daily as  directed. (FOR ICD-10 E10.9, E11.9).   Blood Glucose Monitoring Suppl (ACCU-CHEK AVIVA PLUS) w/Device KIT Use as instructed to test blood sugar once daily or as directed.  Dx:  E11.9  Non-insulin dependent.   buPROPion ER (WELLBUTRIN SR) 100 MG 12 hr tablet Take 1 tablet by mouth once daily   cholecalciferol (VITAMIN D) 1000 UNITS tablet Take 1,000 Units by mouth daily.   cyanocobalamin (,VITAMIN B-12,) 1000 MCG/ML injection INJECT 1 ML  ONCE EVERY MONTH   cyclobenzaprine (FLEXERIL) 10 MG tablet TAKE 1/2 TO 1 (ONE-HALF TO ONE) TABLET BY MOUTH TWICE DAILY AS NEEDED FOR MUSCLE SPASM (SEDATION CAUTION. SKIP LORAZEPAM WHEN USED)   glucose blood (ACCU-CHEK AVIVA PLUS) test strip USE ONE STRIP TO CHECK GLUCOSE ONCE DAILY.  E11.9   Insulin Syringe-Needle U-100 (B-D INSULIN SYRINGE 1CC/25GX1") 25G X 1" 1 ML MISC Use to inject monthly B12 injection   LORazepam (ATIVAN) 2 MG tablet TAKE 0.5 TO 1 TABLET BY MOUTH EVERY 8 HOURS AS NEEDED FOR ANXIETY   metFORMIN (GLUCOPHAGE) 500 MG tablet Take 1 tablet (500 mg total) by mouth 2 (two) times daily with a meal.   metoprolol tartrate (LOPRESSOR) 50 MG tablet Take 1 tablet by mouth twice daily   Omega-3 Fatty Acids (FISH OIL) 1000 MG CAPS Take 1,000 mg by mouth 2 (two) times daily.   omeprazole (PRILOSEC) 20 MG capsule TAKE 1 CAPSULE BY MOUTH TWICE DAILY AS NEEDED  sertraline (ZOLOFT) 50 MG tablet Take 3 tablets (150 mg total) by mouth daily.   fluticasone (FLONASE) 50 MCG/ACT nasal spray Place 2 sprays into both nostrils daily. (Patient not taking: Reported on 09/07/2022)   No facility-administered encounter medications on file as of 09/07/2022.    Allergies (verified) Bisphosphonates, Diclofenac potassium, Naproxen sodium, Sulfonamide derivatives, Ezetimibe-simvastatin, and Simvastatin   History: Past Medical History:  Diagnosis Date   Anemia    Anxiety    Blood transfusion    Blood transfusion without reported diagnosis 1983   prbc   Breast cancer ([place])  03/01/12   left breast lumpectomy=invasive ductal ca,grade I/III,INVILVES SKELATAL MUSCLE   er=+,pr=NEG. HER2 NEG.   Cancer ([place]) 08/09/11 DX    LEFT   Depression    Diabetes mellitus 2011   type II, on pills    Diverticulosis    Dyspnea    with exertion if walks a long ways per patient    GERD (gastroesophageal reflux disease)    Headache    History of radiation therapy    RIGHT JAW 5 TXS FOR ABSESSED TOOTH IN HER 30'S    History of radiation therapy 04/10/12-05/26/12   left breast   Hyperlipidemia    Hypertension    Insomnia, unspecified    Osteoporosis, unspecified    with prev fosamax treatment   Other abnormal glucose    Personal history of radiation therapy    PONV (postoperative nausea and vomiting)    Past Surgical History:  Procedure Laterality Date   ABDOMINAL HYSTERECTOMY  1983   b/ls alpingooph orectomy 4 years later   [place] LUMPECTOMY Left 03/01/2012   LEFT=INVASIVE DUCTAL CA,gRADE i/iii,INVASION AT PSTERIOR RESECTION MARGIN AND INVLOVES SKE;ATAL MUSCLE,PERINEURAL INVASION IDENTIFIED   FRACTURE SURGERY     INGUINAL HERNIA REPAIR Left 1999   [facility]   ORIF FEMUR FRACTURE Right    titanium rod   SIGMOIDOSCOPY     TUBAL LIGATION  1975   Family History  Problem Relation Age of Onset   Diabetes Mother    Alcohol abuse Father    Depression Sister    Arthritis Sister    Diabetes Sister    Parkinson's disease Sister    Hypertension Sister    Diabetes Sister    Cancer Brother    Bladder Cancer Brother    Cancer Brother    COPD Son    Anxiety disorder Son    Alcohol abuse Son    Bladder Cancer Son    Colon cancer Neg Hx    Esophageal cancer Neg Hx    Pancreatic cancer Neg Hx    Rectal cancer Neg Hx    Stomach cancer Neg Hx    Breast cancer Neg Hx    Social History   Socioeconomic History   Marital status: Widowed    Spouse name: Not on file   Number of children: 2   Years of education: Not on file    Highest education level: Not on file  Occupational History   Occupation: retired    Fish farm manager: Retired  Tobacco Use   Smoking status: Never    Passive exposure: Current   Smokeless tobacco: Never  Vaping Use   Vaping Use: Never used  Substance and Sexual Activity   Alcohol use: No    Alcohol/week: 0.0 standard drinks of alcohol   Drug use: No   Sexual activity: Not Currently    Birth control/protection: Post-menopausal  Comment: gxp2, pregnancy 1st child age 59, 2 step children, no hrt  Other Topics Concern   Not on file  Social History Narrative   Widowed 2014.    Living with her daughter.     Social Determinants of Health   Financial Resource Strain: Low Risk  (09/07/2022)   Overall Financial Resource Strain (CARDIA)    Difficulty of Paying Living Expenses: Not hard at all  Food Insecurity: No Food Insecurity (09/07/2022)   Hunger Vital Sign    Worried About Running Out of Food in the Last Year: Never true    Ran Out of Food in the Last Year: Never true  Transportation Needs: No Transportation Needs (09/07/2022)   PRAPARE - Hydrologist (Medical): No    Lack of Transportation (Non-Medical): No  Physical Activity: Inactive (09/07/2022)   Exercise Vital Sign    Days of Exercise per Week: 0 days    Minutes of Exercise per Session: 0 min  Stress: No Stress Concern Present (09/07/2022)   [place]    Feeling of Stress : Not at all  Social Connections: Socially Isolated (09/07/2022)   Social Connection and Isolation Panel [NHANES]    Frequency of Communication with Friends and Family: More than three times a week    Frequency of Social Gatherings with Friends and Family: More than three times a week    Attends Religious Services: Never    Marine scientist or Organizations: No    Attends Archivist Meetings: Never    Marital Status: Widowed    Tobacco  Counseling Counseling given: Not Answered   Clinical Intake:  Pre-visit preparation completed: Yes  Pain : No/denies pain     Nutritional Risks: None Diabetes: Yes CBG done?: No Did pt. bring in CBG monitor from home?: No  How often do you need to have someone help you when you read instructions, pamphlets, or other written materials from your doctor or pharmacy?: 1 - Never  Diabetic?Nutrition Risk Assessment:  Has the patient had any N/V/D within the last 2 months?  No  Does the patient have any non-healing wounds?  No  Has the patient had any unintentional weight loss or weight gain?  No   Diabetes:  Is the patient diabetic?  Yes  If diabetic, was a CBG obtained today?  No  Did the patient bring in their glucometer from home?  No  How often do you monitor your CBG's? 2 times a week.   Financial Strains and Diabetes Management:  Are you having any financial strains with the device, your supplies or your medication? No .  Does the patient want to be seen by Chronic Care Management for management of their diabetes?  No  Would the patient like to be referred to a Nutritionist or for Diabetic Management?  No   Diabetic Exams:  Diabetic Eye Exam: Overdue for diabetic eye exam. Pt has been advised about the importance in completing this exam. Patient advised to call and schedule an eye exam. Diabetic Foot Exam: Completed 12/25/21 PCP  Interpreter Needed?: No  Information entered by :: [place] of Daily Living    09/07/2022    3:22 PM  In your present state of health, do you have any difficulty performing the following activities:  Hearing? 0  Vision? 0  Difficulty concentrating or making decisions? 0  Walking or climbing stairs? 0  Dressing or bathing?  0  Doing errands, shopping? 0  Preparing Food and eating ? N  Using the Toilet? N  In the past six months, have you accidently leaked urine? Y  Comment occasionally if waits to long  Do you  have problems with loss of bowel control? N  Managing your Medications? N  Managing your Finances? N  Housekeeping or managing your Housekeeping? N    Patient Care Team: Tonia Ghent, MD as PCP - Huston Foley, MD as Consulting Physician (General Surgery) Alden Hipp, MD as Consulting Physician (Obstetrics and Gynecology) Irene Shipper, MD as Consulting Physician (Gastroenterology) Debbora Dus, [facility] as Pharmacist (Pharmacist)  Indicate any recent Medical Services you may have received from other than [facility] providers in the past year (date may be approximate).     Assessment:   This is a routine wellness examination for [place].  Hearing/Vision screen Hearing Screening - Comments:: No aid Vision Screening - Comments:: Glasses - unknown provider  Dietary issues and exercise activities discussed: Current Exercise Habits: The patient does not participate in regular exercise at present, Exercise limited by: orthopedic condition(s) (previous back injury)   Goals Addressed             This Visit's Progress    Patient Stated       No new goal.       Depression Screen    09/07/2022    3:19 PM 02/14/2021   12:16 PM 02/14/2021   12:02 PM 08/08/2020    3:33 PM 10/09/2018    3:38 PM 10/06/2017   12:40 PM 10/06/2017   11:45 AM  PHQ 2/9 Scores  PHQ - 2 Score 0 1 1 1  0 0 0  PHQ- 9 Score    2 0 0     Fall Risk    09/07/2022    3:21 PM 02/14/2021   12:24 PM 08/08/2020    3:33 PM 10/09/2018    3:38 PM 10/06/2017   12:40 PM  [place] in the past year? 0 1 0 0 Yes  Comment     June 2018; broke arm after climbing up on barstool. stool turned over and she fell on top of arm  Number falls in past yr: 0 0 0  1  Injury with Fall? 0 1 0  Yes  Risk for fall due to : No Fall Risks No Fall Risks     Follow up Falls prevention discussed;Falls evaluation completed Falls evaluation completed Falls evaluation completed      FALL RISK PREVENTION PERTAINING TO THE  HOME:  Any stairs in or around the home? Yes  If so, are there any without handrails? No  Home free of loose throw rugs in walkways, pet beds, electrical cords, etc? Yes  Adequate lighting in your home to reduce risk of falls? Yes   ASSISTIVE DEVICES UTILIZED TO PREVENT FALLS:  Life alert? No  Use of a cane, walker or w/c? Yes , Uses cane on occasion. Grab bars in the bathroom? Yes  Shower chair or bench in shower? Yes  Elevated toilet seat or a handicapped toilet? No    Cognitive Function:    10/09/2018    3:39 PM 10/06/2017   12:42 PM 09/28/2016   11:35 AM  MMSE - Mini Mental State Exam  Orientation to time 5 5 5   Orientation to Place 5 5 5   Registration 3 3 3   Attention/ Calculation 0 0 0  Recall 3 3 3   Language- name  2 objects 0 0 0  Language- repeat 1 1 1   Language- follow 3 step command 0 3 3  Language- read & follow direction 0 0 0  Write a sentence 0 0 0  Copy design 0 0 0  Total score 17 20 20         09/07/2022    3:23 PM 02/14/2021   12:16 PM  6CIT Screen  What Year? 0 points 0 points  What month? 0 points 0 points  What time? 0 points 0 points  Count back from 20 0 points 0 points  Months in reverse 0 points 2 points  Repeat phrase 0 points 0 points  Total Score 0 points 2 points    Immunizations Immunization History  Administered Date(s) Administered   Fluad Quad(high Dose 65+) 04/19/2022   Influenza Split 03/30/2011   Influenza Whole 04/04/2009, 05/19/2010   Influenza, Seasonal, Injecte, Preservative Fre 05/24/2012   Influenza,inj,Quad PF,6+ Mos 03/28/2013, 02/14/2014, 08/04/2015, 03/20/2019   Influenza-Unspecified 05/08/2016   PFIZER(Purple Top)SARS-COV-2 Vaccination 09/28/2019, 10/22/2019   Pneumococcal Conjugate-13 01/21/2015   Pneumococcal Polysaccharide-23 06/15/2003, 05/28/2016   Td 07/13/2007    TDAP status: Due, Education has been provided regarding the importance of this vaccine. Advised may receive this vaccine at local pharmacy or  Health Dept. Aware to provide a copy of the vaccination record if obtained from local pharmacy or Health Dept. Verbalized acceptance and understanding.  Flu Vaccine status: Up to date  Pneumococcal vaccine status: Up to date  Covid-19 vaccine status: Declined, Education has been provided regarding the importance of this vaccine but patient still declined. Advised may receive this vaccine at local pharmacy or Health Dept.or vaccine clinic. Aware to provide a copy of the vaccination record if obtained from local pharmacy or Health Dept. Verbalized acceptance and understanding.  Qualifies for Shingles Vaccine? Yes   Zostavax completed  unknown   Shingrix Completed?: No.    Education has been provided regarding the importance of this vaccine. Patient has been advised to call insurance company to determine out of pocket expense if they have not yet received this vaccine. Advised may also receive vaccine at local pharmacy or Health Dept. Verbalized acceptance and understanding.  Screening Tests Health Maintenance  Topic Date Due   Zoster Vaccines- Shingrix (1 of 2) Never done   DTaP/Tdap/Td (2 - Tdap) 07/12/2017   Diabetic kidney evaluation - Urine ACR  10/11/2019   COVID-19 Vaccine (3 - Pfizer risk series) 11/19/2019   OPHTHALMOLOGY EXAM  03/05/2021   HEMOGLOBIN A1C  10/18/2022   Diabetic kidney evaluation - eGFR measurement  12/26/2022   FOOT EXAM  12/26/2022   Medicare Annual Wellness (AWV)  09/07/2023   Pneumonia Vaccine 20+ Years old  Completed   INFLUENZA VACCINE  Completed   DEXA SCAN  Completed   HPV VACCINES  Aged Out    Health Maintenance  Health Maintenance Due  Topic Date Due   Zoster Vaccines- Shingrix (1 of 2) Never done   DTaP/Tdap/Td (2 - Tdap) 07/12/2017   Diabetic kidney evaluation - Urine ACR  10/11/2019   COVID-19 Vaccine (3 - Pfizer risk series) 11/19/2019   OPHTHALMOLOGY EXAM  03/05/2021    Colorectal cancer screening: No longer required.   Mammogram status:  No longer required due to age .  Bone Density status: Completed 03/01/16. Results reflect: Bone density results: OSTEOPOROSIS. Repeat every 2 years. Ordered today.  Lung Cancer Screening: (Low Dose CT Chest recommended if Age 72-80 years, 30 pack-year currently smoking OR have quit  w/in 15years.) does not qualify.   Lung Cancer Screening Referral: no  Additional Screening:  Hepatitis C Screening: does not qualify; Completed no  Vision Screening: Recommended annual ophthalmology exams for early detection of glaucoma and other disorders of the eye. Is the patient up to date with their annual eye exam?  No  Who is the provider or what is the name of the office in which the patient attends annual eye exams? Unknown provider If pt is not established with a provider, would they like to be referred to a provider to establish care? Yes .   Dental Screening: Recommended annual dental exams for proper oral hygiene  Community Resource Referral / Chronic Care Management: CRR required this visit?  No   CCM required this visit?  No      Plan:     I have personally reviewed and noted the following in the patient's chart:   Medical and social history Use of alcohol, tobacco or illicit drugs  Current medications and supplements including opioid prescriptions. Patient is not currently taking opioid prescriptions. Functional ability and status Nutritional status Physical activity Advanced directives List of other physicians Hospitalizations, surgeries, and ER visits in previous 12 months Vitals Screenings to include cognitive, depression, and falls Referrals and appointments  In addition, I have reviewed and discussed with patient certain preventive protocols, quality metrics, and best practice recommendations. A written personalized care plan for preventive services as well as general preventive health recommendations were provided to patient.     Lebron Conners, LPN   [id number]    Nurse Notes: order placed for dexa scan at pt's request. Pt declines Covid Vaccine.

## 2022-09-07 NOTE — Patient Instructions (Signed)
[name] , Thank you for taking time to come for your Medicare Wellness Visit. I appreciate your ongoing commitment to your health goals. Please review the following plan we discussed and let me know if I can assist you in the future.   These are the goals we discussed:  Goals      DIET - INCREASE WATER INTAKE     Starting 10/09/2018, I will attempt to drink at least 4-6 glasses of water daily.      Patient Stated     No new goal.        This is a list of the screening recommended for you and due dates:  Health Maintenance  Topic Date Due   Zoster (Shingles) Vaccine (1 of 2) Never done   DTaP/Tdap/Td vaccine (2 - Tdap) 07/12/2017   Yearly kidney health urinalysis for diabetes  10/11/2019   COVID-19 Vaccine (3 - Pfizer risk series) 11/19/2019   Eye exam for diabetics  03/05/2021   Hemoglobin A1C  10/18/2022   Yearly kidney function blood test for diabetes  12/26/2022   Complete foot exam   12/26/2022   Medicare Annual Wellness Visit  09/07/2023   Pneumonia Vaccine  Completed   Flu Shot  Completed   DEXA scan (bone density measurement)  Completed   HPV Vaccine  Aged Out    Advanced directives: Please bring a copy of your health care power of attorney and living will to the office to be added to your chart at your convenience.   Conditions/risks identified:  Aim for 30 minutes of exercise or brisk walking, 6-8 glasses of water, and 5 servings of fruits and vegetables each day.  Next appointment: Follow up in one year for your annual wellness visit 09/12/23 @ 11:00 telephone visit.   Preventive Care 10 Years and Older, Female Preventive care refers to lifestyle choices and visits with your health care provider that can promote health and wellness. What does preventive care include? A yearly physical exam. This is also called an annual well check. Dental exams once or twice a year. Routine eye exams. Ask your health care provider how often you should have your eyes  checked. Personal lifestyle choices, including: Daily care of your teeth and gums. Regular physical activity. Eating a healthy diet. Avoiding tobacco and drug use. Limiting alcohol use. Practicing safe sex. Taking low-dose aspirin every day. Taking vitamin and mineral supplements as recommended by your health care provider. What happens during an annual well check? The services and screenings done by your health care provider during your annual well check will depend on your age, overall health, lifestyle risk factors, and family history of disease. Counseling  Your health care provider may ask you questions about your: Alcohol use. Tobacco use. Drug use. Emotional well-being. Home and relationship well-being. Sexual activity. Eating habits. History of falls. Memory and ability to understand (cognition). Work and work Statistician. Reproductive health. Screening  You may have the following tests or measurements: Height, weight, and BMI. Blood pressure. Lipid and cholesterol levels. These may be checked every 5 years, or more frequently if you are over 54 years old. Skin check. Lung cancer screening. You may have this screening every year starting at age 40 if you have a 30-pack-year history of smoking and currently smoke or have quit within the past 15 years. Fecal occult blood test (FOBT) of the stool. You may have this test every year starting at age 62. Flexible sigmoidoscopy or colonoscopy. You may have a sigmoidoscopy  every 5 years or a colonoscopy every 10 years starting at age 44. Hepatitis C blood test. Hepatitis B blood test. Sexually transmitted disease (STD) testing. Diabetes screening. This is done by checking your blood sugar (glucose) after you have not eaten for a while (fasting). You may have this done every 1-3 years. Bone density scan. This is done to screen for osteoporosis. You may have this done starting at age 39. Mammogram. This may be done every 1-2  years. Talk to your health care provider about how often you should have regular mammograms. Talk with your health care provider about your test results, treatment options, and if necessary, the need for more tests. Vaccines  Your health care provider may recommend certain vaccines, such as: Influenza vaccine. This is recommended every year. Tetanus, diphtheria, and acellular pertussis (Tdap, Td) vaccine. You may need a Td booster every 10 years. Zoster vaccine. You may need this after age 59. Pneumococcal 13-valent conjugate (PCV13) vaccine. One dose is recommended after age 33. Pneumococcal polysaccharide (PPSV23) vaccine. One dose is recommended after age 27. Talk to your health care provider about which screenings and vaccines you need and how often you need them. This information is not intended to replace advice given to you by your health care provider. Make sure you discuss any questions you have with your health care provider. Document Released: 06/27/2015 Document Revised: 02/18/2016 Document Reviewed: 04/01/2015 Elsevier Interactive Patient Education  2017 [place] Prevention in the Home Falls can cause injuries. They can happen to people of all ages. There are many things you can do to make your home safe and to help prevent falls. What can I do on the outside of my home? Regularly fix the edges of walkways and driveways and fix any cracks. Remove anything that might make you trip as you walk through a door, such as a raised step or threshold. Trim any bushes or trees on the path to your home. Use bright outdoor lighting. Clear any walking paths of anything that might make someone trip, such as rocks or tools. Regularly check to see if handrails are loose or broken. Make sure that both sides of any steps have handrails. Any raised decks and porches should have guardrails on the edges. Have any leaves, snow, or ice cleared regularly. Use sand or salt on walking paths  during winter. Clean up any spills in your garage right away. This includes oil or grease spills. What can I do in the bathroom? Use night lights. Install grab bars by the toilet and in the tub and shower. Do not use towel bars as grab bars. Use non-skid mats or decals in the tub or shower. If you need to sit down in the shower, use a plastic, non-slip stool. Keep the floor dry. Clean up any water that spills on the floor as soon as it happens. Remove soap buildup in the tub or shower regularly. Attach bath mats securely with double-sided non-slip rug tape. Do not have throw rugs and other things on the floor that can make you trip. What can I do in the bedroom? Use night lights. Make sure that you have a light by your bed that is easy to reach. Do not use any sheets or blankets that are too big for your bed. They should not hang down onto the floor. Have a firm chair that has side arms. You can use this for support while you get dressed. Do not have throw rugs and other things  on the floor that can make you trip. What can I do in the kitchen? Clean up any spills right away. Avoid walking on wet floors. Keep items that you use a lot in easy-to-reach places. If you need to reach something above you, use a strong step stool that has a grab bar. Keep electrical cords out of the way. Do not use floor polish or wax that makes floors slippery. If you must use wax, use non-skid floor wax. Do not have throw rugs and other things on the floor that can make you trip. What can I do with my stairs? Do not leave any items on the stairs. Make sure that there are handrails on both sides of the stairs and use them. Fix handrails that are broken or loose. Make sure that handrails are as long as the stairways. Check any carpeting to make sure that it is firmly attached to the stairs. Fix any carpet that is loose or worn. Avoid having throw rugs at the top or bottom of the stairs. If you do have throw  rugs, attach them to the floor with carpet tape. Make sure that you have a light switch at the top of the stairs and the bottom of the stairs. If you do not have them, ask someone to add them for you. What else can I do to help prevent falls? Wear shoes that: Do not have high heels. Have rubber bottoms. Are comfortable and fit you well. Are closed at the toe. Do not wear sandals. If you use a stepladder: Make sure that it is fully opened. Do not climb a closed stepladder. Make sure that both sides of the stepladder are locked into place. Ask someone to hold it for you, if possible. Clearly mark and make sure that you can see: Any grab bars or handrails. First and last steps. Where the edge of each step is. Use tools that help you move around (mobility aids) if they are needed. These include: Canes. Walkers. Scooters. Crutches. Turn on the lights when you go into a dark area. Replace any light bulbs as soon as they burn out. Set up your furniture so you have a clear path. Avoid moving your furniture around. If any of your floors are uneven, fix them. If there are any pets around you, be aware of where they are. Review your medicines with your doctor. Some medicines can make you feel dizzy. This can increase your chance of falling. Ask your doctor what other things that you can do to help prevent falls. This information is not intended to replace advice given to you by your health care provider. Make sure you discuss any questions you have with your health care provider. Document Released: 03/27/2009 Document Revised: 11/06/2015 Document Reviewed: 07/05/2014 Elsevier Interactive Patient Education  2017 [employer].

## 2022-09-08 ENCOUNTER — Other Ambulatory Visit: Payer: Self-pay | Admitting: Family Medicine

## 2022-09-19 ENCOUNTER — Other Ambulatory Visit: Payer: Self-pay | Admitting: Family Medicine

## 2022-09-19 DIAGNOSIS — M810 Age-related osteoporosis without current pathological fracture: Secondary | ICD-10-CM

## 2022-09-19 DIAGNOSIS — E119 Type 2 diabetes mellitus without complications: Secondary | ICD-10-CM

## 2022-09-19 DIAGNOSIS — E538 Deficiency of other specified B group vitamins: Secondary | ICD-10-CM

## 2022-09-23 ENCOUNTER — Other Ambulatory Visit: Payer: Self-pay | Admitting: Family Medicine

## 2022-09-24 ENCOUNTER — Other Ambulatory Visit: Payer: Medicare Other

## 2022-09-30 ENCOUNTER — Other Ambulatory Visit: Payer: Self-pay | Admitting: Family Medicine

## 2022-09-30 NOTE — Telephone Encounter (Signed)
Refill request for  LORAZEPAM 2 MG TAB   LOV - 04/19/22 Next OV - 10/01/22 Last refill - 09/01/22 #90/0

## 2022-10-01 ENCOUNTER — Ambulatory Visit (INDEPENDENT_AMBULATORY_CARE_PROVIDER_SITE_OTHER)
Admission: RE | Admit: 2022-10-01 | Discharge: 2022-10-01 | Disposition: A | Discharge status: Home / Self Care | Payer: Medicare Other | Source: Ambulatory Visit | Attending: Family Medicine | Admitting: Family Medicine

## 2022-10-01 ENCOUNTER — Ambulatory Visit (INDEPENDENT_AMBULATORY_CARE_PROVIDER_SITE_OTHER): Payer: Medicare Other | Admitting: Family Medicine

## 2022-10-01 ENCOUNTER — Encounter: Payer: Self-pay | Admitting: Family Medicine

## 2022-10-01 VITALS — BP 160/64 | HR 62 | Temp 98.2°F | Ht 60.0 in | Wt 122.0 lb

## 2022-10-01 DIAGNOSIS — M545 Low back pain, unspecified: Secondary | ICD-10-CM | POA: Diagnosis not present

## 2022-10-01 DIAGNOSIS — F411 Generalized anxiety disorder: Secondary | ICD-10-CM

## 2022-10-01 DIAGNOSIS — M549 Dorsalgia, unspecified: Secondary | ICD-10-CM

## 2022-10-01 DIAGNOSIS — R8290 Unspecified abnormal findings in urine: Secondary | ICD-10-CM | POA: Diagnosis not present

## 2022-10-01 DIAGNOSIS — E538 Deficiency of other specified B group vitamins: Secondary | ICD-10-CM

## 2022-10-01 DIAGNOSIS — I10 Essential (primary) hypertension: Secondary | ICD-10-CM | POA: Diagnosis not present

## 2022-10-01 DIAGNOSIS — M546 Pain in thoracic spine: Secondary | ICD-10-CM | POA: Diagnosis not present

## 2022-10-01 DIAGNOSIS — E119 Type 2 diabetes mellitus without complications: Secondary | ICD-10-CM

## 2022-10-01 DIAGNOSIS — M810 Age-related osteoporosis without current pathological fracture: Secondary | ICD-10-CM

## 2022-10-01 DIAGNOSIS — G720 Drug-induced myopathy: Secondary | ICD-10-CM

## 2022-10-01 DIAGNOSIS — M40204 Unspecified kyphosis, thoracic region: Secondary | ICD-10-CM | POA: Diagnosis not present

## 2022-10-01 LAB — POC URINALSYSI DIPSTICK (AUTOMATED)
Blood, UA: NEGATIVE
Glucose, UA: POSITIVE — AB
Ketones, UA: NEGATIVE
Leukocytes, UA: NEGATIVE
Nitrite, UA: NEGATIVE
Protein, UA: POSITIVE — AB
Spec Grav, UA: >=1.03 — AB (ref 1.010–1.025)
Urobilinogen, UA: 0.2 E.U./dL
pH, UA: 5.5 (ref 5.0–8.0)

## 2022-10-01 LAB — CBC WITH DIFFERENTIAL/PLATELET
Basophils Absolute: 74 cells/uL (ref 0–200)
Basophils Relative: 0.9 %
MCV: 89.3 fL (ref 80.0–100.0)
Total Lymphocyte: 20.3 %

## 2022-10-01 MED ORDER — LORAZEPAM 2 MG PO TABS: ORAL_TABLET | ORAL | 1 refills | Status: DC

## 2022-10-01 MED ORDER — TRAZODONE HCL 50 MG PO TABS: 25.0000 mg | ORAL_TABLET | Freq: Every evening | ORAL | 3 refills | Status: DC | PRN

## 2022-10-01 NOTE — Addendum Note (Signed)
Addended by: Audie Stayer J on: 10/01/2022 03:56 PM   Modules accepted: Orders  

## 2022-10-01 NOTE — Addendum Note (Signed)
Addended by: Alvina Chou on: 10/01/2022 03:56 PM   Modules accepted: Orders

## 2022-10-01 NOTE — Patient Instructions (Addendum)
Try trazodone at night to decrease lorazepam use.  Use 1/2 to 1 lorazepam during the day if needed.  We'll update you about your labs and xrays.  Take care.  Glad to see you.

## 2022-10-01 NOTE — Progress Notes (Unsigned)
dysuria: no burning but change in odor.   duration of symptoms: a few weeks.   abdominal pain: no fevers: no back pain: see below.   vomiting: no Labs pending.    Back pain. Some days with more pain than others.  No radicular leg pain.  More back pain on standing, better with sitting.  Heating pad helps. Tylenol didn't help.  Films are pending.  Mood d/w pt.  "Okay I guess."   Lucila Maine is going to rehab soon, with court pending.  At home with grandson and daughter.  She has multiple stressors noted.  We talked about insomnia and transitioning lorazepam to trazodone at night.  She is still taking sertraline at baseline.  We talked about using lorazepam only during the day when needed.Sedation caution discussed with patient.  B12 shot monthly.  Compliant.  Dosed at home.  Labs pending.    Diabetes:  Using medications without difficulties: yes Hypoglycemic episodes:no Hyperglycemic episodes:no Feet problems: no Blood Sugars averaging: not checked often.  eye exam within last year: d/w pt about follow up.   Labs pending.   Hypertension:    Using medication without problems or lightheadedness: yes Chest pain with exertion: no Edema: no Short of breath:no  Labs pending.   Statin intolerant.  Meds, vitals, and allergies reviewed.   Per HPI unless specifically indicated in ROS section   GEN: nad, alert and oriented HEENT: mucous membranes moist NECK: supple CV: rrr.  PULM: ctab, no inc wob ABD: soft, +bs, suprapubic area not tender EXT: no edema SKIN: no acute rash BACK: no CVA pain

## 2022-10-02 LAB — CBC WITH DIFFERENTIAL/PLATELET
Absolute Monocytes: 631 cells/uL (ref 200–950)
Eosinophils Absolute: 164 cells/uL (ref 15–500)
Eosinophils Relative: 2 %
HCT: 40.1 % (ref 35.0–45.0)
Hemoglobin: 13.2 g/dL (ref 11.7–15.5)
Lymphs Abs: 1665 cells/uL (ref 850–3900)
MCH: 29.4 pg (ref 27.0–33.0)
MCHC: 32.9 g/dL (ref 32.0–36.0)
MPV: 12.1 fL (ref 7.5–12.5)
Monocytes Relative: 7.7 %
Neutro Abs: 5666 cells/uL (ref 1500–7800)
Neutrophils Relative %: 69.1 %
Platelets: 252 10*3/uL (ref 140–400)
RBC: 4.49 10*6/uL (ref 3.80–5.10)
RDW: 13.6 % (ref 11.0–15.0)
WBC: 8.2 10*3/uL (ref 3.8–10.8)

## 2022-10-02 LAB — COMPREHENSIVE METABOLIC PANEL
AG Ratio: 1.4 (calc) (ref 1.0–2.5)
ALT: 6 U/L (ref 6–29)
AST: 14 U/L (ref 10–35)
Albumin: 4.3 g/dL (ref 3.6–5.1)
Alkaline phosphatase (APISO): 53 U/L (ref 37–153)
BUN: 19 mg/dL (ref 7–25)
CO2: 23 mmol/L (ref 20–32)
Calcium: 10.2 mg/dL (ref 8.6–10.4)
Chloride: 101 mmol/L (ref 98–110)
Creat: 0.7 mg/dL (ref 0.60–0.95)
Globulin: 3.1 g/dL (calc) (ref 1.9–3.7)
Glucose, Bld: 147 mg/dL — ABNORMAL HIGH (ref 65–99)
Potassium: 4.2 mmol/L (ref 3.5–5.3)
Sodium: 137 mmol/L (ref 135–146)
Total Bilirubin: 0.3 mg/dL (ref 0.2–1.2)
Total Protein: 7.4 g/dL (ref 6.1–8.1)

## 2022-10-02 LAB — VITAMIN D 25 HYDROXY (VIT D DEFICIENCY, FRACTURES): Vit D, 25-Hydroxy: 33 ng/mL (ref 30–100)

## 2022-10-02 LAB — LIPID PANEL
Cholesterol: 207 mg/dL — ABNORMAL HIGH (ref <200)
HDL: 33 mg/dL — ABNORMAL LOW (ref >=50)
LDL Cholesterol (Calc): 126 mg/dL (calc) — ABNORMAL HIGH
Non-HDL Cholesterol (Calc): 174 mg/dL (calc) — ABNORMAL HIGH (ref <130)
Total CHOL/HDL Ratio: 6.3 (calc) — ABNORMAL HIGH (ref <5.0)
Triglycerides: 336 mg/dL — ABNORMAL HIGH (ref <150)

## 2022-10-02 LAB — MICROALBUMIN / CREATININE URINE RATIO
Microalb Creat Ratio: 7 mg/g creat (ref <30)
Microalb, Ur: 1.2 mg/dL

## 2022-10-02 LAB — TSH: TSH: 1.8 mIU/L (ref 0.40–4.50)

## 2022-10-02 LAB — HEMOGLOBIN A1C
Hgb A1c MFr Bld: 7.4 % of total Hgb — ABNORMAL HIGH (ref <5.7)
Mean Plasma Glucose: 166 mg/dL
eAG (mmol/L): 9.2 mmol/L

## 2022-10-02 LAB — VITAMIN B12: Vitamin B-12: 441 pg/mL (ref 200–1100)

## 2022-10-03 ENCOUNTER — Other Ambulatory Visit: Payer: Self-pay | Admitting: Family Medicine

## 2022-10-03 DIAGNOSIS — R8290 Unspecified abnormal findings in urine: Secondary | ICD-10-CM | POA: Insufficient documentation

## 2022-10-03 LAB — URINE CULTURE
MICRO NUMBER:: 14849128
SPECIMEN QUALITY:: ADEQUATE

## 2022-10-03 LAB — MICROALBUMIN / CREATININE URINE RATIO: Creatinine, Urine: 176 mg/dL (ref 20–275)

## 2022-10-03 MED ORDER — CEPHALEXIN 500 MG PO CAPS
500.0000 mg | ORAL_CAPSULE | Freq: Three times a day (TID) | ORAL | 0 refills | Status: DC
Start: 2022-10-03 — End: 2022-11-01

## 2022-10-03 NOTE — Assessment & Plan Note (Signed)
Compliant with replacement.  See notes on labs.

## 2022-10-03 NOTE — Assessment & Plan Note (Signed)
Statin intolerant 

## 2022-10-03 NOTE — Assessment & Plan Note (Signed)
Reasonable check plain films given her history.  See notes on imaging.

## 2022-10-03 NOTE — Assessment & Plan Note (Signed)
Continue amlodipine and metoprolol 

## 2022-10-03 NOTE — Assessment & Plan Note (Signed)
See notes on labs.  Benign abdominal exam.

## 2022-10-03 NOTE — Assessment & Plan Note (Signed)
Continue metformin, see notes on labs.  Recheck periodically.

## 2022-10-03 NOTE — Telephone Encounter (Signed)
See OV note.  

## 2022-10-03 NOTE — Assessment & Plan Note (Addendum)
With insomnia noted.  Transition to trazodone at night for sleep.  Routine cautions given to patient.  Can use lorazepam during the day if needed.  Sedation caution.  Continue Wellbutrin and sertraline at baseline.

## 2022-10-05 ENCOUNTER — Telehealth: Payer: Self-pay | Admitting: Family Medicine

## 2022-10-05 NOTE — Telephone Encounter (Signed)
Patient returned call regarding her lab results,would like a call back.

## 2022-10-06 NOTE — Telephone Encounter (Signed)
Patient has been notified of results.  

## 2022-10-10 ENCOUNTER — Other Ambulatory Visit: Payer: Self-pay | Admitting: Family Medicine

## 2022-10-10 DIAGNOSIS — M549 Dorsalgia, unspecified: Secondary | ICD-10-CM

## 2022-10-11 ENCOUNTER — Telehealth: Payer: Self-pay | Admitting: Family Medicine

## 2022-10-11 DIAGNOSIS — Z743 Need for continuous supervision: Secondary | ICD-10-CM | POA: Diagnosis not present

## 2022-10-11 DIAGNOSIS — S22060A Wedge compression fracture of T7-T8 vertebra, initial encounter for closed fracture: Secondary | ICD-10-CM | POA: Diagnosis not present

## 2022-10-11 DIAGNOSIS — R079 Chest pain, unspecified: Secondary | ICD-10-CM | POA: Diagnosis not present

## 2022-10-11 DIAGNOSIS — S299XXA Unspecified injury of thorax, initial encounter: Secondary | ICD-10-CM | POA: Diagnosis not present

## 2022-10-11 DIAGNOSIS — S22050A Wedge compression fracture of T5-T6 vertebra, initial encounter for closed fracture: Secondary | ICD-10-CM | POA: Diagnosis not present

## 2022-10-11 DIAGNOSIS — J9811 Atelectasis: Secondary | ICD-10-CM | POA: Diagnosis not present

## 2022-10-11 DIAGNOSIS — S3992XA Unspecified injury of lower back, initial encounter: Secondary | ICD-10-CM | POA: Diagnosis not present

## 2022-10-11 DIAGNOSIS — S2241XA Multiple fractures of ribs, right side, initial encounter for closed fracture: Secondary | ICD-10-CM | POA: Diagnosis not present

## 2022-10-11 DIAGNOSIS — R29818 Other symptoms and signs involving the nervous system: Secondary | ICD-10-CM | POA: Diagnosis not present

## 2022-10-11 DIAGNOSIS — M25511 Pain in right shoulder: Secondary | ICD-10-CM | POA: Diagnosis not present

## 2022-10-11 DIAGNOSIS — S3991XA Unspecified injury of abdomen, initial encounter: Secondary | ICD-10-CM | POA: Diagnosis not present

## 2022-10-11 DIAGNOSIS — J90 Pleural effusion, not elsewhere classified: Secondary | ICD-10-CM | POA: Diagnosis not present

## 2022-10-11 DIAGNOSIS — R296 Repeated falls: Secondary | ICD-10-CM | POA: Diagnosis not present

## 2022-10-11 DIAGNOSIS — Z853 Personal history of malignant neoplasm of breast: Secondary | ICD-10-CM | POA: Diagnosis not present

## 2022-10-11 DIAGNOSIS — M4856XA Collapsed vertebra, not elsewhere classified, lumbar region, initial encounter for fracture: Secondary | ICD-10-CM | POA: Diagnosis not present

## 2022-10-11 DIAGNOSIS — Z7984 Long term (current) use of oral hypoglycemic drugs: Secondary | ICD-10-CM | POA: Diagnosis not present

## 2022-10-11 DIAGNOSIS — M4854XA Collapsed vertebra, not elsewhere classified, thoracic region, initial encounter for fracture: Secondary | ICD-10-CM | POA: Diagnosis not present

## 2022-10-11 DIAGNOSIS — D509 Iron deficiency anemia, unspecified: Secondary | ICD-10-CM | POA: Diagnosis not present

## 2022-10-11 DIAGNOSIS — R918 Other nonspecific abnormal finding of lung field: Secondary | ICD-10-CM | POA: Diagnosis not present

## 2022-10-11 DIAGNOSIS — S3993XA Unspecified injury of pelvis, initial encounter: Secondary | ICD-10-CM | POA: Diagnosis not present

## 2022-10-11 DIAGNOSIS — J45909 Unspecified asthma, uncomplicated: Secondary | ICD-10-CM | POA: Diagnosis not present

## 2022-10-11 DIAGNOSIS — F32A Depression, unspecified: Secondary | ICD-10-CM | POA: Diagnosis not present

## 2022-10-11 DIAGNOSIS — I10 Essential (primary) hypertension: Secondary | ICD-10-CM | POA: Diagnosis not present

## 2022-10-11 DIAGNOSIS — E119 Type 2 diabetes mellitus without complications: Secondary | ICD-10-CM | POA: Diagnosis not present

## 2022-10-11 DIAGNOSIS — E785 Hyperlipidemia, unspecified: Secondary | ICD-10-CM | POA: Diagnosis not present

## 2022-10-11 DIAGNOSIS — J9601 Acute respiratory failure with hypoxia: Secondary | ICD-10-CM | POA: Diagnosis not present

## 2022-10-11 DIAGNOSIS — Z043 Encounter for examination and observation following other accident: Secondary | ICD-10-CM | POA: Diagnosis not present

## 2022-10-11 DIAGNOSIS — Z7409 Other reduced mobility: Secondary | ICD-10-CM | POA: Diagnosis not present

## 2022-10-11 DIAGNOSIS — R6889 Other general symptoms and signs: Secondary | ICD-10-CM | POA: Diagnosis not present

## 2022-10-11 DIAGNOSIS — R0902 Hypoxemia: Secondary | ICD-10-CM | POA: Diagnosis not present

## 2022-10-11 DIAGNOSIS — Z79899 Other long term (current) drug therapy: Secondary | ICD-10-CM | POA: Diagnosis not present

## 2022-10-11 DIAGNOSIS — W19XXXA Unspecified fall, initial encounter: Secondary | ICD-10-CM | POA: Diagnosis not present

## 2022-10-11 DIAGNOSIS — M810 Age-related osteoporosis without current pathological fracture: Secondary | ICD-10-CM | POA: Diagnosis not present

## 2022-10-11 DIAGNOSIS — S32020A Wedge compression fracture of second lumbar vertebra, initial encounter for closed fracture: Secondary | ICD-10-CM | POA: Diagnosis not present

## 2022-10-11 DIAGNOSIS — G4700 Insomnia, unspecified: Secondary | ICD-10-CM | POA: Diagnosis not present

## 2022-10-11 NOTE — Telephone Encounter (Signed)
Larita Fife called in and stated that [name] is on the way to the hospital. She was taking by ambulance about 15 minutes ago.

## 2022-10-11 NOTE — Telephone Encounter (Signed)
Noted, will await ER report.  Thanks.  

## 2022-10-11 NOTE — Telephone Encounter (Signed)
Spoke to patient given verbal ok to talk with son Larita Fife. Patient had 3 falls Saturday night. Patient was not sure if she was dizzy or tripped states it all happened so fast. She did hit head during one of the falls but did not loose consciousness. Patient is currently being treated for UTI and has taken last dose of antibiotic today. Patient does not take blood thinner but is having some pain in ribs and shoulder with some pain in head. Family denies any increased cognitive changes outside of her normal. Due to pain and head injury patient and family have agreed to go to [facility] point for evaluation. They will let us know if any questions or further assistance is needed.

## 2022-10-11 NOTE — Telephone Encounter (Signed)
Noted and agreed. Thanks.

## 2022-10-11 NOTE — Telephone Encounter (Signed)
Pt son [name] called in stated pt has fell 3 times over the weekend and is in pain requesting a call back . To know what to do . Please advise # [phone]

## 2022-10-12 DIAGNOSIS — S2241XA Multiple fractures of ribs, right side, initial encounter for closed fracture: Secondary | ICD-10-CM | POA: Diagnosis not present

## 2022-10-12 DIAGNOSIS — R079 Chest pain, unspecified: Secondary | ICD-10-CM | POA: Diagnosis not present

## 2022-10-12 DIAGNOSIS — R296 Repeated falls: Secondary | ICD-10-CM | POA: Diagnosis not present

## 2022-10-13 DIAGNOSIS — S32020A Wedge compression fracture of second lumbar vertebra, initial encounter for closed fracture: Secondary | ICD-10-CM | POA: Diagnosis not present

## 2022-10-13 DIAGNOSIS — R296 Repeated falls: Secondary | ICD-10-CM | POA: Diagnosis not present

## 2022-10-13 DIAGNOSIS — R29818 Other symptoms and signs involving the nervous system: Secondary | ICD-10-CM | POA: Diagnosis not present

## 2022-10-13 DIAGNOSIS — S2241XA Multiple fractures of ribs, right side, initial encounter for closed fracture: Secondary | ICD-10-CM | POA: Diagnosis not present

## 2022-10-13 DIAGNOSIS — S22050A Wedge compression fracture of T5-T6 vertebra, initial encounter for closed fracture: Secondary | ICD-10-CM | POA: Diagnosis not present

## 2022-10-13 DIAGNOSIS — S22060A Wedge compression fracture of T7-T8 vertebra, initial encounter for closed fracture: Secondary | ICD-10-CM | POA: Diagnosis not present

## 2022-10-14 DIAGNOSIS — Z789 Other specified health status: Secondary | ICD-10-CM | POA: Insufficient documentation

## 2022-10-14 DIAGNOSIS — Z7984 Long term (current) use of oral hypoglycemic drugs: Secondary | ICD-10-CM | POA: Diagnosis not present

## 2022-10-14 DIAGNOSIS — E119 Type 2 diabetes mellitus without complications: Secondary | ICD-10-CM | POA: Diagnosis not present

## 2022-10-14 DIAGNOSIS — F32A Depression, unspecified: Secondary | ICD-10-CM | POA: Diagnosis not present

## 2022-10-14 DIAGNOSIS — R296 Repeated falls: Secondary | ICD-10-CM | POA: Diagnosis not present

## 2022-10-14 DIAGNOSIS — Z7409 Other reduced mobility: Secondary | ICD-10-CM | POA: Insufficient documentation

## 2022-10-14 DIAGNOSIS — I10 Essential (primary) hypertension: Secondary | ICD-10-CM | POA: Diagnosis not present

## 2022-10-15 ENCOUNTER — Telehealth: Payer: Self-pay | Admitting: Family Medicine

## 2022-10-15 MED ORDER — HYDROCODONE-ACETAMINOPHEN 5-325 MG PO TABS: 0.5000 | ORAL_TABLET | Freq: Four times a day (QID) | ORAL | 0 refills | Status: DC | PRN

## 2022-10-15 NOTE — Telephone Encounter (Signed)
I would schedule 2 extra strength tylenol 3 times a day.  If that isn't helping, then use an OTC lidocaine patch.  If that isn't helping then would use 1/2 tab of hydrocodone in place of tylenol.   Sedation and fall caution on the hydrocodone use.  Please let me know how this goes.  Rx sent to [facility]. Thanks.

## 2022-10-15 NOTE — Telephone Encounter (Addendum)
Pt son call in requesting pain medication for pt stated she is in a lot of pain after being discharge from the hospital . Please advise [phone]

## 2022-10-15 NOTE — Telephone Encounter (Signed)
Patient daughter in law called in and stated that patient was recently in the hospital and has 4 broken ribs. She stated that they didn't prescribe her anything for pain and she is in a lot of pain. She was wanting to know if Dr. Para March could call something in for the pain. Please advise.Thank you!

## 2022-10-15 NOTE — Telephone Encounter (Signed)
Patient daughter called in to follow up on this. Informed her of message below.

## 2022-10-15 NOTE — Addendum Note (Signed)
Addended by: Joaquim Nam on: 10/15/2022 01:51 PM   Modules accepted: Orders

## 2022-10-18 DIAGNOSIS — M800AXD Age-related osteoporosis with current pathological fracture, other site, subsequent encounter for fracture with routine healing: Secondary | ICD-10-CM | POA: Diagnosis not present

## 2022-10-18 DIAGNOSIS — M8008XD Age-related osteoporosis with current pathological fracture, vertebra(e), subsequent encounter for fracture with routine healing: Secondary | ICD-10-CM | POA: Diagnosis not present

## 2022-10-18 DIAGNOSIS — J90 Pleural effusion, not elsewhere classified: Secondary | ICD-10-CM | POA: Diagnosis not present

## 2022-10-18 DIAGNOSIS — I119 Hypertensive heart disease without heart failure: Secondary | ICD-10-CM | POA: Diagnosis not present

## 2022-10-18 DIAGNOSIS — J9601 Acute respiratory failure with hypoxia: Secondary | ICD-10-CM | POA: Diagnosis not present

## 2022-10-19 ENCOUNTER — Telehealth: Payer: Self-pay

## 2022-10-19 DIAGNOSIS — J90 Pleural effusion, not elsewhere classified: Secondary | ICD-10-CM | POA: Diagnosis not present

## 2022-10-19 DIAGNOSIS — J9601 Acute respiratory failure with hypoxia: Secondary | ICD-10-CM | POA: Diagnosis not present

## 2022-10-19 DIAGNOSIS — M8008XD Age-related osteoporosis with current pathological fracture, vertebra(e), subsequent encounter for fracture with routine healing: Secondary | ICD-10-CM | POA: Diagnosis not present

## 2022-10-19 DIAGNOSIS — I119 Hypertensive heart disease without heart failure: Secondary | ICD-10-CM | POA: Diagnosis not present

## 2022-10-19 DIAGNOSIS — M800AXD Age-related osteoporosis with current pathological fracture, other site, subsequent encounter for fracture with routine healing: Secondary | ICD-10-CM | POA: Diagnosis not present

## 2022-10-19 MED ORDER — AMLODIPINE BESYLATE 5 MG PO TABS: 10.0000 mg | ORAL_TABLET | Freq: Every day | ORAL | Status: DC

## 2022-10-19 NOTE — Telephone Encounter (Signed)
Don called back in and stated that patient blood pressure at 10:10 was still 180/80 and that was 2 minutes sitting after walking to bathroom. He stated he didn't get the pulse reading.

## 2022-10-19 NOTE — Telephone Encounter (Signed)
Called Don back and lmtcb

## 2022-10-19 NOTE — Addendum Note (Signed)
Addended by: Joaquim Nam on: 10/19/2022 01:38 PM   Modules accepted: Orders

## 2022-10-19 NOTE — Telephone Encounter (Signed)
I would inc amlodipine to 10mg  a day (2 of the 5mg  tabs).  It will take a few days to help with BP but let me know how that goes.  If she already had a dose today, she can take a 2nd dose of 5mg  now.  Starting tomorrow could take 10mg  in the AM.  Thanks.

## 2022-10-19 NOTE — Telephone Encounter (Signed)
Don from [facility] called in that patients BP this morning before taking her medication was 180/80. She did take her meds last night and then went ahead and took them after her BP was taken. Don wanted to see if something needs to be adjusted with her medications?

## 2022-10-19 NOTE — Telephone Encounter (Signed)
Please recheck BP later today with her pulse and let me know about both.

## 2022-10-20 ENCOUNTER — Telehealth: Payer: Self-pay | Admitting: Family Medicine

## 2022-10-20 NOTE — Telephone Encounter (Signed)
Spoke with patients daughter and patient about medication increase. Both verbalized understanding.

## 2022-10-20 NOTE — Telephone Encounter (Signed)
.  Home Health verbal orders Caller Name:Donal  Agency Name: [facility] number: [phone]  Requesting OT/PT/Skilled nursing/Social Work/Speech:  Reason:OT // skilled nursing aide   Frequency:nurse aide  2 x w  - 4 w OT -  2 x w - 1 w  , 1 x w  - 1 w , skip  1 w - 1 w  Please forward to [facility] pool or providers CMA

## 2022-10-20 NOTE — Telephone Encounter (Signed)
Please give the order.  Thanks.   

## 2022-10-20 NOTE — Telephone Encounter (Signed)
Verbal orders have been given to [facility]

## 2022-10-21 ENCOUNTER — Ambulatory Visit: Payer: Medicare Other | Admitting: Sports Medicine

## 2022-10-21 DIAGNOSIS — J9601 Acute respiratory failure with hypoxia: Secondary | ICD-10-CM | POA: Diagnosis not present

## 2022-10-21 DIAGNOSIS — M8008XD Age-related osteoporosis with current pathological fracture, vertebra(e), subsequent encounter for fracture with routine healing: Secondary | ICD-10-CM | POA: Diagnosis not present

## 2022-10-21 DIAGNOSIS — M800AXD Age-related osteoporosis with current pathological fracture, other site, subsequent encounter for fracture with routine healing: Secondary | ICD-10-CM | POA: Diagnosis not present

## 2022-10-21 DIAGNOSIS — I119 Hypertensive heart disease without heart failure: Secondary | ICD-10-CM | POA: Diagnosis not present

## 2022-10-21 DIAGNOSIS — J90 Pleural effusion, not elsewhere classified: Secondary | ICD-10-CM | POA: Diagnosis not present

## 2022-10-21 MED ORDER — HYDROCODONE-ACETAMINOPHEN 5-325 MG PO TABS: 1.0000 | ORAL_TABLET | Freq: Four times a day (QID) | ORAL | 0 refills | Status: DC | PRN

## 2022-10-21 NOTE — Telephone Encounter (Signed)
Agree.  Thanks.  I sent the rx to [facility] to take up to 1 tab of hydrocodone per dose.  Sedation caution.   Please let me know about pain control and BP when she has the higher dose of pain medication.

## 2022-10-21 NOTE — Addendum Note (Signed)
Addended by: Patience Musca on: 10/21/2022 04:12 PM   Modules accepted: Orders

## 2022-10-21 NOTE — Addendum Note (Signed)
Addended by: Joaquim Nam on: 10/21/2022 05:36 PM   Modules accepted: Orders

## 2022-10-21 NOTE — Telephone Encounter (Signed)
Rhunette Croft OT with [facility]  said that pt has been resting for 30 mins or more and before any exercising or movement BP was taken x 3 and BP remained 184/74 P 60 and oxygen level 96%  T 97.9  Pt is not complaining with H/A dizziness, CP,SOB or vision changes. Pt had recent fx of 3 ribs and pt is having pain in her back where the ribs are fx. Pain level now is 7.  Pt has taken Hydrocodone apap 5-325 mg taking 1/2 on 10/20/22 in AM and pt took 1 tab on 10/20/22 in PM at nightime and then again on 10/21/22 at 11:30 AM taking 1/2 tab. Pt said the 1/2 tab does not help back pain at all. Pt said taking the 1 tab last night did help the back pain. Pt has 3 tabs left of Hydrocodone apap 5-325 mg.  Wondering if could get more Hydrocodone apap 5-325 mg with increase of taking 1 tab instead of 1/2 tab on instructions. To [facility]. R Johnson wanted to know if could have pt walk to rest room and sit on tub transfer chair, rest and then simulate taking a bath and then getting off transfer chair and walking back to her chair. Dr Para March said yes if pt can tolerate and continuing to watch pt for fall risk. Dr Para March will review med request. Sending note to Dr Para March and Para March pool.

## 2022-10-22 NOTE — Telephone Encounter (Signed)
Called patient reviewed all information and repeated back to me. Will call if any questions.  Advised sedation with increased dose.  She will give [place] update after she starts new dose.

## 2022-10-25 ENCOUNTER — Telehealth: Payer: Self-pay | Admitting: Pharmacist

## 2022-10-25 ENCOUNTER — Telehealth: Payer: Self-pay | Admitting: Family Medicine

## 2022-10-25 DIAGNOSIS — J9601 Acute respiratory failure with hypoxia: Secondary | ICD-10-CM | POA: Diagnosis not present

## 2022-10-25 DIAGNOSIS — M8008XD Age-related osteoporosis with current pathological fracture, vertebra(e), subsequent encounter for fracture with routine healing: Secondary | ICD-10-CM | POA: Diagnosis not present

## 2022-10-25 DIAGNOSIS — I119 Hypertensive heart disease without heart failure: Secondary | ICD-10-CM | POA: Diagnosis not present

## 2022-10-25 DIAGNOSIS — I10 Essential (primary) hypertension: Secondary | ICD-10-CM

## 2022-10-25 DIAGNOSIS — E119 Type 2 diabetes mellitus without complications: Secondary | ICD-10-CM

## 2022-10-25 DIAGNOSIS — J90 Pleural effusion, not elsewhere classified: Secondary | ICD-10-CM | POA: Diagnosis not present

## 2022-10-25 DIAGNOSIS — M800AXD Age-related osteoporosis with current pathological fracture, other site, subsequent encounter for fracture with routine healing: Secondary | ICD-10-CM | POA: Diagnosis not present

## 2022-10-25 MED ORDER — LISINOPRIL 5 MG PO TABS
5.0000 mg | ORAL_TABLET | Freq: Every day | ORAL | 0 refills | Status: DC
Start: 2022-10-25 — End: 2022-11-24

## 2022-10-25 NOTE — Telephone Encounter (Signed)
Don with [facility] notified as instructed by telephone and verbalized understanding.[name] stated the patient's pain level today was zero and last week was a 2.  Left a message on voicemail or patient to call the office back office hours left on messsage.

## 2022-10-25 NOTE — Addendum Note (Signed)
Addended by: Joaquim Nam on: 10/25/2022 05:09 PM   Modules accepted: Orders

## 2022-10-25 NOTE — Telephone Encounter (Signed)
PharmD reviewed patient chart to assess eligibility for [facility] services. Patient was determined to be a good candidate for the program given the complexity of the medication regimen and overall risk for hospitalization and/or high healthcare utilization.   Referral entered in order to outreach patient and offer appointment with PharmD. Referral cosigned to PCP.  

## 2022-10-25 NOTE — Telephone Encounter (Signed)
How is her pain control?    I would add on lisinopril 5mg  a day.  Rx sent.  Please let me know how her BP is over the next few days.  Thanks.

## 2022-10-25 NOTE — Telephone Encounter (Signed)
Din from [facility] called over to report Kyesha blood pressure. He stated that at rest her BP was 172/80 and 2 minutes after activity it was 180/64. Thank you!

## 2022-10-26 DIAGNOSIS — J45909 Unspecified asthma, uncomplicated: Secondary | ICD-10-CM | POA: Diagnosis not present

## 2022-10-26 DIAGNOSIS — F411 Generalized anxiety disorder: Secondary | ICD-10-CM

## 2022-10-26 DIAGNOSIS — J90 Pleural effusion, not elsewhere classified: Secondary | ICD-10-CM | POA: Diagnosis not present

## 2022-10-26 DIAGNOSIS — E119 Type 2 diabetes mellitus without complications: Secondary | ICD-10-CM | POA: Diagnosis not present

## 2022-10-26 DIAGNOSIS — E785 Hyperlipidemia, unspecified: Secondary | ICD-10-CM

## 2022-10-26 DIAGNOSIS — M800AXD Age-related osteoporosis with current pathological fracture, other site, subsequent encounter for fracture with routine healing: Secondary | ICD-10-CM | POA: Diagnosis not present

## 2022-10-26 DIAGNOSIS — F32A Depression, unspecified: Secondary | ICD-10-CM

## 2022-10-26 DIAGNOSIS — R918 Other nonspecific abnormal finding of lung field: Secondary | ICD-10-CM

## 2022-10-26 DIAGNOSIS — Z853 Personal history of malignant neoplasm of breast: Secondary | ICD-10-CM

## 2022-10-26 DIAGNOSIS — M62830 Muscle spasm of back: Secondary | ICD-10-CM | POA: Diagnosis not present

## 2022-10-26 DIAGNOSIS — I119 Hypertensive heart disease without heart failure: Secondary | ICD-10-CM | POA: Diagnosis not present

## 2022-10-26 DIAGNOSIS — K219 Gastro-esophageal reflux disease without esophagitis: Secondary | ICD-10-CM | POA: Diagnosis not present

## 2022-10-26 DIAGNOSIS — J9601 Acute respiratory failure with hypoxia: Secondary | ICD-10-CM | POA: Diagnosis not present

## 2022-10-26 DIAGNOSIS — Z9181 History of falling: Secondary | ICD-10-CM

## 2022-10-26 DIAGNOSIS — D509 Iron deficiency anemia, unspecified: Secondary | ICD-10-CM | POA: Diagnosis not present

## 2022-10-26 DIAGNOSIS — G4700 Insomnia, unspecified: Secondary | ICD-10-CM | POA: Diagnosis not present

## 2022-10-26 DIAGNOSIS — Z7984 Long term (current) use of oral hypoglycemic drugs: Secondary | ICD-10-CM

## 2022-10-26 DIAGNOSIS — M8008XD Age-related osteoporosis with current pathological fracture, vertebra(e), subsequent encounter for fracture with routine healing: Secondary | ICD-10-CM | POA: Diagnosis not present

## 2022-10-26 NOTE — Telephone Encounter (Signed)
Spoke with patient and advised she will still continue with the amlodipine and adding on the lisinopril.

## 2022-10-26 NOTE — Telephone Encounter (Addendum)
Patient calling back to find out who called her, didn't see anything, but patient is aware of new prescription for lisinopril (ZESTRIL) 5 MG tablet  And wants to know if she should also continue the  amLODipine (NORVASC) 5 MG tablet  Please follow-up with the patient at [phone]

## 2022-10-27 DIAGNOSIS — M800AXD Age-related osteoporosis with current pathological fracture, other site, subsequent encounter for fracture with routine healing: Secondary | ICD-10-CM | POA: Diagnosis not present

## 2022-10-27 DIAGNOSIS — J9601 Acute respiratory failure with hypoxia: Secondary | ICD-10-CM | POA: Diagnosis not present

## 2022-10-27 DIAGNOSIS — J90 Pleural effusion, not elsewhere classified: Secondary | ICD-10-CM | POA: Diagnosis not present

## 2022-10-27 DIAGNOSIS — M8008XD Age-related osteoporosis with current pathological fracture, vertebra(e), subsequent encounter for fracture with routine healing: Secondary | ICD-10-CM | POA: Diagnosis not present

## 2022-10-27 DIAGNOSIS — I119 Hypertensive heart disease without heart failure: Secondary | ICD-10-CM | POA: Diagnosis not present

## 2022-10-28 ENCOUNTER — Telehealth: Payer: Self-pay

## 2022-10-28 NOTE — Progress Notes (Signed)
Care Management & Coordination Services Pharmacy Team  Reason for Encounter: Appointment Reminder  Contacted patient to confirm telephone appointment with Al Corpus , PharmD on 11/01/22 at 10:00. Spoke with patient on 10/28/2022   Do you have any problems getting your medications? No  What is your top health concern you would like to discuss at your upcoming visit? Patient is having high BP readings lately. Systolic BP 10/27/22  170. Recent L2 fracture, rib fx"s,  having some pain. Home health PT. Dr.Duncan added lisinopril and patient taken 1 dose.  Have you seen any other providers since your last visit with PCP? Yes  Chart review:  Recent office visits:  10/01/22-Graham Duncan,MD(PCP)-urine odor,back pain,labs,(kidney and liver tests normal,A1c 7.4, reasonable, other test normal) (urine culture positive  E.coli ) sent in keflex. try trazodone at night,decrease lorazepam use.  Recent consult visits:  None in 6 months   Hospital visits:  Medication Reconciliation was completed by comparing discharge summary, patient's EMR and Pharmacy list, and upon discussion with patient.  Admitted to the hospital on 10/11/22 due to Compression Fracture L2,multiple rib fx.s . Discharge date was 10/14/22. Discharged from [facility].    Medications that remain the same after Hospital Discharge:?? Discharged home with Home health  -All other medications will remain the same.     Star Rating Drugs:  Medication:  Last Fill: Day Supply Lisinopril 5mg   10/25/22 90 Metformin 500mg  09/09/22 90   Care Gaps: Annual wellness visit in last year? Yes  If Diabetic: Last eye exam / retinopathy screening:2021 Last diabetic foot exam:UTD   Al Corpus, PharmD notified  Burt Knack, [facility] Clinical Pharmacy Assistant [phone]

## 2022-10-29 ENCOUNTER — Other Ambulatory Visit: Payer: Self-pay | Admitting: Family Medicine

## 2022-10-29 DIAGNOSIS — J90 Pleural effusion, not elsewhere classified: Secondary | ICD-10-CM | POA: Diagnosis not present

## 2022-10-29 DIAGNOSIS — J9601 Acute respiratory failure with hypoxia: Secondary | ICD-10-CM | POA: Diagnosis not present

## 2022-10-29 DIAGNOSIS — M8008XD Age-related osteoporosis with current pathological fracture, vertebra(e), subsequent encounter for fracture with routine healing: Secondary | ICD-10-CM | POA: Diagnosis not present

## 2022-10-29 DIAGNOSIS — M800AXD Age-related osteoporosis with current pathological fracture, other site, subsequent encounter for fracture with routine healing: Secondary | ICD-10-CM | POA: Diagnosis not present

## 2022-10-29 DIAGNOSIS — I119 Hypertensive heart disease without heart failure: Secondary | ICD-10-CM | POA: Diagnosis not present

## 2022-10-29 NOTE — Telephone Encounter (Signed)
Refill request for  HYDROCODONE-APAP 5-325 MG TAB   LOV - 10/01/22 Next OV - not scheduled Last refill - 10/21/22 #20/0

## 2022-10-31 NOTE — Telephone Encounter (Signed)
Sent. Thanks.  She needs BP f/u at OV with labs at the visit.

## 2022-11-01 ENCOUNTER — Ambulatory Visit: Payer: Medicare Other | Admitting: Pharmacist

## 2022-11-01 DIAGNOSIS — J90 Pleural effusion, not elsewhere classified: Secondary | ICD-10-CM | POA: Diagnosis not present

## 2022-11-01 DIAGNOSIS — I119 Hypertensive heart disease without heart failure: Secondary | ICD-10-CM | POA: Diagnosis not present

## 2022-11-01 DIAGNOSIS — M8008XD Age-related osteoporosis with current pathological fracture, vertebra(e), subsequent encounter for fracture with routine healing: Secondary | ICD-10-CM | POA: Diagnosis not present

## 2022-11-01 DIAGNOSIS — M800AXD Age-related osteoporosis with current pathological fracture, other site, subsequent encounter for fracture with routine healing: Secondary | ICD-10-CM | POA: Diagnosis not present

## 2022-11-01 DIAGNOSIS — J9601 Acute respiratory failure with hypoxia: Secondary | ICD-10-CM | POA: Diagnosis not present

## 2022-11-01 NOTE — Telephone Encounter (Signed)
Lvmtcb, sent mychart message  

## 2022-11-01 NOTE — Progress Notes (Unsigned)
Care Management & Coordination Services Pharmacy Note  11/01/2022 Name:  [name] MRN:  [id number] DOB:  Oct 22, 1940  Summary: Initial televisit -HTN: pt struggling with high BP lately in s/o increased pain, yesterday BP 150/79 in evening; she checks at random each day; she recently started lisinopril and will need BMP -DM: A1c 7.6% on metformin monotherapy; she has not been checking sugars; she is overdue for DM eye exam and agrees to schedule [facility] exam at [facility] -Osteoporosis: with recent hip/rib fractures; she has hx of atypical fracture while on fosamax so further pharmacological treatments are not recommended; her reported diet is low in calcium and she is only taking a Vitamin D supplement -she is taking high dose omeprazole BID and denies any issues with GERD, may benefit from PPI taper so as not to exacerbate osteoporosis   Recommendations/Changes made from today's visit: -schedule OV for BP check and BMP within 2 weeks -Monitor BP 2 hrs after AM meds; goal < 140/90 -Monitor fasting BG in s/o increased pain -Add calcium supplement (combo Ca-Vit D ok) -Trial omeprazole once daily -[facility] Eye exam  - schedule w/ [facility]  Follow up plan: -Health Concierge will call patient 2 weeks for update on above -Pharmacist follow up televisit scheduled for 1 month -PCP 11/05/22   Subjective: [name] is an 82 y.o. year old female who is a primary patient of Para March, Dwana Curd, MD.  The care coordination team was consulted for assistance with disease management and care coordination needs.    Engaged with patient by telephone for initial visit. Patient is currently staying with son and DIL following discharge from hospital for fall/compression fracture. Normally lives with daughter.  Recent office visits: 10/25/22 TE - BP 172/80. Add lisinopril 5 mg. 10/19/22 TE - BP 180/80. Increase amlodipine to 10 mg. 10/15/22 TE - pain control. Rx norco 5 #10  10/01/22 Dr Para March OV: CPE - insomnia.  Rx trazodone (decrease lorazepam use). A1c 7.4%, Ucx + E coli. Xray with compression deformity. See ortho/spine.   Recent consult visits: None  Hospital visits: 10/11/22 - 10/14/22 Admission ([facility]): hip and rib fracture, falls. Ativan reduced from 2 to 0.5 mg PRN   Objective:  Lab Results  Component Value Date   CREATININE 0.70 10/01/2022   BUN 19 10/01/2022   GFR 92.53 07/02/2019   EGFR 84 (L) 02/20/2016   GFRNONAA >60 05/17/2016   GFRAA >60 05/17/2016   NA 137 10/01/2022   K 4.2 10/01/2022   CALCIUM 10.2 10/01/2022   CO2 23 10/01/2022   GLUCOSE 147 (H) 10/01/2022    Lab Results  Component Value Date/Time   HGBA1C 7.4 (H) 10/01/2022 04:21 PM   HGBA1C 6.9 (A) 04/19/2022 04:27 PM   HGBA1C 7.0 (H) 12/25/2021 03:24 PM   HGBA1C 8.1 (H) 02/20/2016 11:08 AM   GFR 92.53 07/02/2019 04:16 PM   GFR 92.70 10/11/2018 12:34 PM   MICROALBUR 1.2 10/01/2022 03:57 PM   MICROALBUR <0.7 10/11/2018 12:34 PM    Last diabetic Eye exam:  Lab Results  Component Value Date/Time   HMDIABEYEEXA No Retinopathy 03/05/2020 12:00 AM    Last diabetic Foot exam: No results found for: "HMDIABFOOTEX"   Lab Results  Component Value Date   CHOL 207 (H) 10/01/2022   HDL 33 (L) 10/01/2022   LDLCALC 126 (H) 10/01/2022   LDLDIRECT 133.0 01/21/2015   TRIG 336 (H) 10/01/2022   CHOLHDL 6.3 (H) 10/01/2022       Latest Ref Rng & Units 10/01/2022  4:21 PM 12/25/2021    3:24 PM 07/02/2019    4:16 PM  Hepatic Function  Total Protein 6.1 - 8.1 g/dL 7.4  7.6  7.3   Albumin 3.5 - 5.2 g/dL   4.1   AST 10 - 35 U/L 14  14  17    ALT 6 - 29 U/L 6  10  13    Alk Phosphatase 39 - 117 U/L   54   Total Bilirubin 0.2 - 1.2 mg/dL 0.3  0.7  0.5     Lab Results  Component Value Date/Time   TSH 1.80 10/01/2022 04:21 PM   TSH 1.85 12/25/2021 03:24 PM       Latest Ref Rng & Units 10/01/2022    4:21 PM 12/25/2021    3:24 PM 07/02/2019    4:16 PM  CBC  WBC 3.8 - 10.8 Thousand/uL 8.2  12.2  9.2   Hemoglobin  11.7 - 15.5 g/dL 45.4  09.8  11.9   Hematocrit 35.0 - 45.0 % 40.1  40.1  41.0   Platelets 140 - 400 Thousand/uL 252  240  255.0     Lab Results  Component Value Date/Time   VD25OH 33 10/01/2022 04:21 PM   VD25OH 31 12/25/2021 03:24 PM   VD25OH 35.79 12/16/2020 04:23 PM   VD25OH 46.27 10/11/2018 12:34 PM   VITAMINB12 441 10/01/2022 04:21 PM   VITAMINB12 460 12/25/2021 03:24 PM    Clinical ASCVD: No  The ASCVD Risk score (Arnett DK, et al., 2019) failed to calculate for the following reasons:   The 2019 ASCVD risk score is only valid for ages 68 to 40       10/01/2022    3:39 PM 09/07/2022    3:19 PM 02/14/2021   12:16 PM  Depression screen PHQ 2/9  Decreased Interest 1 0 0  Down, Depressed, Hopeless 1 0 1  PHQ - 2 Score 2 0 1  Altered sleeping 1    Tired, decreased energy 1    Change in appetite 0    Feeling bad or failure about yourself  0    Trouble concentrating 0    Moving slowly or fidgety/restless 0    Suicidal thoughts 0    PHQ-9 Score 4    Difficult doing work/chores Somewhat difficult         10/01/2022    3:39 PM  GAD 7 : Generalized Anxiety Score  Nervous, Anxious, on Edge 1  Control/stop worrying 1  Worry too much - different things 1  Trouble relaxing 0  Restless 0  Easily annoyed or irritable 0  Afraid - awful might happen 1  Total GAD 7 Score 4  Anxiety Difficulty Somewhat difficult     Social History   Tobacco Use  Smoking Status Never   Passive exposure: Current  Smokeless Tobacco Never   BP Readings from Last 3 Encounters:  10/01/22 (!) 160/64  04/19/22 (!) 144/72  12/25/21 110/74   Pulse Readings from Last 3 Encounters:  10/01/22 62  04/19/22 63  12/25/21 68   Wt Readings from Last 3 Encounters:  10/01/22 122 lb (55.3 kg)  09/07/22 122 lb (55.3 kg)  04/19/22 123 lb (55.8 kg)   BMI Readings from Last 3 Encounters:  10/01/22 23.83 kg/m  09/07/22 23.83 kg/m  04/19/22 24.02 kg/m    Allergies  Allergen Reactions    Bisphosphonates     Unclear relation to meds, but pt had long bone fracture after treatment with fosamax   Diclofenac Potassium  Not sure   Naproxen Sodium     Not sure   Sulfonamide Derivatives    Ezetimibe-Simvastatin     myalgias   Simvastatin     myalgias    Medications Reviewed Today     Reviewed by Kathyrn Sheriff, [facility] (Pharmacist) on 11/01/22 at 1350  Med List Status: <None>   Medication Order Taking? Sig Documenting Provider Last Dose Status Informant  Accu-Chek Softclix Lancets lancets [id number] Yes USE ONCE DAILY AS DIRECTED.  E11.9 Joaquim Nam, MD Taking Active   amLODipine (NORVASC) 5 MG tablet [id number] Yes Take 2 tablets (10 mg total) by mouth daily. Joaquim Nam, MD Taking Active   blood glucose meter kit and supplies [id number] Yes Dispense based on patient and insurance preference. Use up to four times daily as directed. (FOR ICD-10 E10.9, E11.9). Joaquim Nam, MD Taking Active   Blood Glucose Monitoring Suppl (ACCU-CHEK AVIVA PLUS) w/Device KIT [id number] Yes Use as instructed to test blood sugar once daily or as directed.  Dx:  E11.9  Non-insulin dependent. Joaquim Nam, MD Taking Active   buPROPion ER [facility] SR) 100 MG 12 hr tablet [id number] Yes Take 1 tablet by mouth once daily Joaquim Nam, MD Taking Active   cholecalciferol (VITAMIN D) 1000 UNITS tablet [id number] Yes Take 1,000 Units by mouth daily. [provider] Taking Active Self  cyanocobalamin (VITAMIN B12) 1000 MCG/ML injection [id number] Yes INJECT 1 ML  ONCE EVERY MONTH Joaquim Nam, MD Taking Active   fluticasone [facility]) 50 MCG/ACT nasal spray [id number] Yes Place 2 sprays into both nostrils daily. Joaquim Nam, MD Taking Active   glucose blood (ACCU-CHEK AVIVA PLUS) test strip [id number] Yes USE ONE STRIP TO CHECK GLUCOSE ONCE DAILY.  E11.9 Joaquim Nam, MD Taking Active   HYDROcodone-acetaminophen (NORCO/VICODIN) 5-325 MG tablet [id number] Yes TAKE ONE  TABLET EVERY 6 HOURS AS NEEDED FOR MODERATE PAIN Joaquim Nam, MD Taking Active   Insulin Syringe-Needle U-100 (B-D INSULIN SYRINGE 1CC/25GX1") 25G X 1" 1 ML MISC [id number] Yes Use to inject monthly B12 injection Joaquim Nam, MD Taking Active   lisinopril (ZESTRIL) 5 MG tablet [id number] Yes Take 1 tablet (5 mg total) by mouth daily. Joaquim Nam, MD Taking Active   LORazepam (ATIVAN) 2 MG tablet [id number] Yes TAKE 0.5 TO 1 TABLET BY MOUTH DAILY AS NEEDED FOR ANXIETY.  DO NOT TAKE AT NIGHT WITH TRAZODONE. Joaquim Nam, MD Taking Active   metFORMIN (GLUCOPHAGE) 500 MG tablet [id number] Yes Take 1 tablet (500 mg total) by mouth 2 (two) times daily with a meal. Joaquim Nam, MD Taking Active   metoprolol tartrate (LOPRESSOR) 50 MG tablet [id number] Yes Take 1 tablet by mouth twice daily Joaquim Nam, MD Taking Active   Omega-3 Fatty Acids (FISH OIL) 1000 MG CAPS [id number] Yes Take 1,000 mg by mouth 2 (two) times daily. [provider] Taking Active Self  omeprazole (PRILOSEC) 20 MG capsule [id number] Yes TAKE 1 CAPSULE BY MOUTH TWICE DAILY AS NEEDED Joaquim Nam, MD Taking Active   sertraline (ZOLOFT) 50 MG tablet [id number] Yes Take 3 tablets (150 mg total) by mouth daily. Joaquim Nam, MD Taking Active   traZODone (DESYREL) 50 MG tablet [id number] Yes Take 0.5-1 tablets (25-50 mg total) by mouth at bedtime as needed for sleep. Joaquim Nam, MD Taking Active             SDOH:  (Social Determinants of Health) assessments and interventions  performed: Yes SDOH Interventions    Flowsheet Row Care Coordination from 11/01/2022 in [facility] Office Visit from 10/01/2022 in [facility] Clinical Support from 09/07/2022 in [facility] Clinical Support from 02/14/2021 in [facility] Clinical Support from 10/09/2018 in [facility] Clinical Support from 09/28/2016 in [facility] [place]  SDOH Interventions        Food Insecurity Interventions -- -- Intervention Not Indicated Intervention Not Indicated -- --  Housing Interventions Intervention Not Indicated -- Intervention Not Indicated Intervention Not Indicated -- --  Transportation Interventions Intervention Not Indicated -- Intervention Not Indicated Intervention Not Indicated -- --  Utilities Interventions -- -- Intervention Not Indicated -- -- --  Alcohol Usage Interventions -- -- Intervention Not Indicated (Score <7) -- -- --  Depression Interventions/Treatment  -- Counseling -- -- PHQ2-9 Score <4 Follow-up Not Indicated --  [pt is being referred to PCP for further evaluation]  Financial Strain Interventions -- -- Intervention Not Indicated Intervention Not Indicated -- --  Physical Activity Interventions -- -- Patient Refused, Other (Comments) Intervention Not Indicated -- --  Stress Interventions -- -- Intervention Not Indicated -- -- --  Social Connections Interventions -- -- Intervention Not Indicated  [pt feels like needs are being met] Intervention Not Indicated -- --       Medication Assistance: None required.  Patient affirms current coverage meets needs.  Medication Access: Name and location of current pharmacy:  [id number] - [place], [place] - [address] [place] [address] Phone: [phone] Fax: [phone]  [facility] - [place], [place] - [address] [place] [address] Phone: [phone] Fax: [phone]  Within the past 30 days, how often has patient missed a dose of medication? 0 Is a pillbox or other method used to improve adherence? Yes  Factors that may affect medication adherence? no barriers identified Are meds synced by current pharmacy? No  Are meds delivered by current pharmacy? Yes  Does patient experience delays in  picking up medications due to transportation concerns? No   Compliance/Adherence/Medication fill history: Care Gaps: Eye exam (due 02/2021)  Star-Rating Drugs: Lisinopril - PDC 100% Metformin - PDC 93%   Assessment/Plan  Hypertension (BP goal <140/90) -Uncontrolled - BP has been elevated recently in s/o recent compression and rib fractures; she started lisinopril in the past week, she will need BMP shortly -Current home readings: 150/79 (PM) -Current treatment: Amlodipine 5 mg - 2 tab daily - Appropriate, Query Effective Lisinopril 5 mg daily AM -Appropriate, Query Effective Metoprolol tartrate 25 mg BID -Appropriate, Query Effective -Medications previously tried: n/a  -Current dietary habits: Ensure, pack of crackers in AM, sandwich, chicken tenders, green peas, pineapple, 1 Pepsi per day, tea, water, rare coffee -Current exercise habits: PT -Educated on BP goals and benefits of medications for prevention of heart attack, stroke and kidney damage; Importance of home blood pressure monitoring; Proper BP monitoring technique; -Counseled to monitor BP at home daily 2 hrs after AM meds, document, and provide log at future appointments -Recommended to continue current medication; schedule OV for BP check and BMP  Hyperlipidemia: (LDL goal < 100) -Uncontrolled - LDL 126 (09/2022), pt has not tolerated simvastatin per chart review -Current treatment: Omega 3 fish oil 1000 mg - not taking currently -Medications previously tried: simvastatin, vytorin  -  Not addressed today, consider trial of hydrophilic statin in future (prava, rosuva)  Diabetes (A1c goal <8%) -Controlled - A1c 7.4% (09/2022); pt does drink a regular Pepsi daily -Current home glucose readings - not checking right now -Current medications: Metformin 500 mg BID - Appropriate, Effective, Safe, Accessible Testing supplies -Medications previously tried: n/a  -Educated on A1c and blood sugar goals; -Counseled to check feet  daily and get yearly eye exams - pt would like to schedule [facility] eye exam at [facility], request forwarded to scheduling -Recommended to continue current medication; advised to monitor fasting BG once daily in s/o increased pain  Depression/Anxiety (Goal: manage symptoms) -Controlled - mood is "ok" per pt -PHQ9: 4 (09/2022) -GAD7: 4 (09/2022) -Connected with PCP for mental health support -Current treatment: Bupropion SR 100 mg daily - Appropriate, Effective, Safe, Accessible Sertraline 50 mg daily -Appropriate, Effective, Safe, Accessible Trazodone 50 mg PRN -Appropriate, Effective, Query Safe Lorazepam 2 mg - 1/2 to tab PRN Appropriate, Effective, Query Safe -Medications previously tried/failed: n/a -Educated on risks associated with lorazepam and trazodone if taken together; pt denies next-day drowsiness or sedation, falls -Recommended to continue current medication  Osteoporosis (Goal: prevent fractures) -Not ideally controlled - pt was recently hospitalized for hip and rib fractures after a fall - pt has history of atypical fracture while taking fosamax and multiple providers have recommended against further pharmacological treatment  -Last DEXA Scan: 02/2016   T-Score total hip: -2.2  T-Score lumbar spine: -4.4 -Patient is a candidate for pharmacologic treatment due to T-Score < -2.5 in lumbar spine -Current treatment  Vitamin D 1000 IU - Appropriate, Effective, Safe, Accessible -Medications previously tried: fosamax (atypical long bone fracture)  -Recommend 1200 mg of calcium daily from dietary and supplemental sources. - advised to start a combination supplement with Ca-Vit D -DEXA ordered 09/07/22, pt has not scheduled due to other health concerns  GERD (Goal: minimize symptoms of reflux ) -Controlled - pt reports reflux is under control, she does not have any issues; she has been taking PPI BID for years -Current treatment  Omeprazole 20 mg BID - Query appropriate -Medications  previously tried: none reported  -Hx of Bleeds/ulcers: No -Discussed PPI relation to osteoporosis; advised trial of omeprazole 20 mg once daily  Falls / Fractures (Goal: prevent falls) -Recent admission for fall with rib fractures; pt denies further falls since being home, she is undergoing PT -Pt is currently taking hydrocodone-APAP 5-325 mg PRN, 1-2 times per day typically -Discussed opioid risks including sedation and constipation; pt is taking docusate and reports no further issues with constipation  Health Maintenance -Vaccine gaps: Shingrix, TDAP -B12 injections -Constipation: docusate    Al Corpus, PharmD, [place]y [place], CPP Clinical Pharmacist Practitioner [facility] [phone]

## 2022-11-02 DIAGNOSIS — J90 Pleural effusion, not elsewhere classified: Secondary | ICD-10-CM | POA: Diagnosis not present

## 2022-11-02 DIAGNOSIS — M8008XD Age-related osteoporosis with current pathological fracture, vertebra(e), subsequent encounter for fracture with routine healing: Secondary | ICD-10-CM | POA: Diagnosis not present

## 2022-11-02 DIAGNOSIS — I119 Hypertensive heart disease without heart failure: Secondary | ICD-10-CM | POA: Diagnosis not present

## 2022-11-02 DIAGNOSIS — J9601 Acute respiratory failure with hypoxia: Secondary | ICD-10-CM | POA: Diagnosis not present

## 2022-11-02 DIAGNOSIS — M800AXD Age-related osteoporosis with current pathological fracture, other site, subsequent encounter for fracture with routine healing: Secondary | ICD-10-CM | POA: Diagnosis not present

## 2022-11-03 DIAGNOSIS — J90 Pleural effusion, not elsewhere classified: Secondary | ICD-10-CM | POA: Diagnosis not present

## 2022-11-03 DIAGNOSIS — M8008XD Age-related osteoporosis with current pathological fracture, vertebra(e), subsequent encounter for fracture with routine healing: Secondary | ICD-10-CM | POA: Diagnosis not present

## 2022-11-03 DIAGNOSIS — I119 Hypertensive heart disease without heart failure: Secondary | ICD-10-CM | POA: Diagnosis not present

## 2022-11-03 DIAGNOSIS — J9601 Acute respiratory failure with hypoxia: Secondary | ICD-10-CM | POA: Diagnosis not present

## 2022-11-03 DIAGNOSIS — M800AXD Age-related osteoporosis with current pathological fracture, other site, subsequent encounter for fracture with routine healing: Secondary | ICD-10-CM | POA: Diagnosis not present

## 2022-11-05 ENCOUNTER — Ambulatory Visit (INDEPENDENT_AMBULATORY_CARE_PROVIDER_SITE_OTHER): Payer: Medicare Other | Admitting: Family Medicine

## 2022-11-05 ENCOUNTER — Encounter: Payer: Self-pay | Admitting: Family Medicine

## 2022-11-05 VITALS — BP 130/62 | HR 58 | Temp 97.9°F | Ht 60.0 in | Wt 120.0 lb

## 2022-11-05 DIAGNOSIS — I119 Hypertensive heart disease without heart failure: Secondary | ICD-10-CM | POA: Diagnosis not present

## 2022-11-05 DIAGNOSIS — M8008XD Age-related osteoporosis with current pathological fracture, vertebra(e), subsequent encounter for fracture with routine healing: Secondary | ICD-10-CM | POA: Diagnosis not present

## 2022-11-05 DIAGNOSIS — I10 Essential (primary) hypertension: Secondary | ICD-10-CM | POA: Diagnosis not present

## 2022-11-05 DIAGNOSIS — M800AXD Age-related osteoporosis with current pathological fracture, other site, subsequent encounter for fracture with routine healing: Secondary | ICD-10-CM | POA: Diagnosis not present

## 2022-11-05 DIAGNOSIS — J90 Pleural effusion, not elsewhere classified: Secondary | ICD-10-CM | POA: Diagnosis not present

## 2022-11-05 DIAGNOSIS — J9601 Acute respiratory failure with hypoxia: Secondary | ICD-10-CM | POA: Diagnosis not present

## 2022-11-05 DIAGNOSIS — R918 Other nonspecific abnormal finding of lung field: Secondary | ICD-10-CM | POA: Diagnosis not present

## 2022-11-05 LAB — BASIC METABOLIC PANEL
BUN: 13 mg/dL (ref 6–23)
CO2: 29 mEq/L (ref 19–32)
Calcium: 9.6 mg/dL (ref 8.4–10.5)
Chloride: 101 mEq/L (ref 96–112)
Creatinine, Ser: 0.55 mg/dL (ref 0.40–1.20)
GFR: 84.58 mL/min (ref >60.00)
Glucose, Bld: 237 mg/dL — ABNORMAL HIGH (ref 70–99)
Potassium: 4.2 mEq/L (ref 3.5–5.1)
Sodium: 138 mEq/L (ref 135–145)

## 2022-11-05 NOTE — Progress Notes (Signed)
IMPRESSION: Right lateral 3rd, 5th and 6th rib fractures. Trace right pleural effusion. No pneumothorax.  Patchy ground-glass opacities in the right lung, favor atelectasis. Cannot completely exclude infection or early contusion changes.  At least 2 right middle lobe pulmonary nodules, the largest 7 mm. Non-contrast chest CT at 3-6 months is recommended. If the nodules are stable at time of repeat CT, then future CT at 18-24 months (from today's scan) is considered optional for low-risk patients, but is recommended for high-risk patients. This recommendation follows the consensus statement: Guidelines for Management of Incidental Pulmonary Nodules Detected on CT Images: From the Fleischner Society 2017; Radiology 2017; 284:228-243.  ====================== Pain is better in the meantime.  "I'm doing a lot better than I was."  No hydrocodone use in the last few days.    BP controlled here.  BP usually 160/70-80s at home.  Sugar has been ~170 recently.    Fall cautions d/w pt.  Using a cane.

## 2022-11-05 NOTE — Patient Instructions (Addendum)
Go to the lab on the way out.   If you have mychart we'll likely use that to update you.    Take care.  Glad to see you. If your BP or sugar stay elevated then let me know.  I'll work on the follow up scan for the end of July or early August.

## 2022-11-08 DIAGNOSIS — R918 Other nonspecific abnormal finding of lung field: Secondary | ICD-10-CM | POA: Insufficient documentation

## 2022-11-08 NOTE — Assessment & Plan Note (Signed)
I'll work on the follow up scan for the end of July or early August.   Discussed with patient.  She agrees.

## 2022-11-08 NOTE — Assessment & Plan Note (Signed)
Continue lisinopril and amlodipine.  See notes on labs.

## 2022-11-10 ENCOUNTER — Other Ambulatory Visit: Payer: Self-pay | Admitting: Family Medicine

## 2022-11-10 DIAGNOSIS — M8008XD Age-related osteoporosis with current pathological fracture, vertebra(e), subsequent encounter for fracture with routine healing: Secondary | ICD-10-CM | POA: Diagnosis not present

## 2022-11-10 DIAGNOSIS — J90 Pleural effusion, not elsewhere classified: Secondary | ICD-10-CM | POA: Diagnosis not present

## 2022-11-10 DIAGNOSIS — M800AXD Age-related osteoporosis with current pathological fracture, other site, subsequent encounter for fracture with routine healing: Secondary | ICD-10-CM | POA: Diagnosis not present

## 2022-11-10 DIAGNOSIS — J9601 Acute respiratory failure with hypoxia: Secondary | ICD-10-CM | POA: Diagnosis not present

## 2022-11-10 DIAGNOSIS — I119 Hypertensive heart disease without heart failure: Secondary | ICD-10-CM | POA: Diagnosis not present

## 2022-11-11 NOTE — Telephone Encounter (Signed)
Refill request for LORAZEPAM 2 MG TAB   LOV - 11/05/22 Next OV - not scheduled Last refill - 10/01/22 #30/1

## 2022-11-12 DIAGNOSIS — I119 Hypertensive heart disease without heart failure: Secondary | ICD-10-CM | POA: Diagnosis not present

## 2022-11-12 DIAGNOSIS — M800AXD Age-related osteoporosis with current pathological fracture, other site, subsequent encounter for fracture with routine healing: Secondary | ICD-10-CM | POA: Diagnosis not present

## 2022-11-12 DIAGNOSIS — M8008XD Age-related osteoporosis with current pathological fracture, vertebra(e), subsequent encounter for fracture with routine healing: Secondary | ICD-10-CM | POA: Diagnosis not present

## 2022-11-12 DIAGNOSIS — J9601 Acute respiratory failure with hypoxia: Secondary | ICD-10-CM | POA: Diagnosis not present

## 2022-11-12 DIAGNOSIS — J90 Pleural effusion, not elsewhere classified: Secondary | ICD-10-CM | POA: Diagnosis not present

## 2022-11-12 NOTE — Telephone Encounter (Signed)
This is early to fill.  Rx set to hold for next refill due on 11/27/22.  Please update patient.  Thanks.

## 2022-11-17 DIAGNOSIS — I119 Hypertensive heart disease without heart failure: Secondary | ICD-10-CM | POA: Diagnosis not present

## 2022-11-17 DIAGNOSIS — M800AXD Age-related osteoporosis with current pathological fracture, other site, subsequent encounter for fracture with routine healing: Secondary | ICD-10-CM | POA: Diagnosis not present

## 2022-11-17 DIAGNOSIS — J90 Pleural effusion, not elsewhere classified: Secondary | ICD-10-CM | POA: Diagnosis not present

## 2022-11-17 DIAGNOSIS — J9601 Acute respiratory failure with hypoxia: Secondary | ICD-10-CM | POA: Diagnosis not present

## 2022-11-17 DIAGNOSIS — M8008XD Age-related osteoporosis with current pathological fracture, vertebra(e), subsequent encounter for fracture with routine healing: Secondary | ICD-10-CM | POA: Diagnosis not present

## 2022-11-18 ENCOUNTER — Telehealth: Payer: Self-pay

## 2022-11-18 DIAGNOSIS — J9601 Acute respiratory failure with hypoxia: Secondary | ICD-10-CM | POA: Diagnosis not present

## 2022-11-18 DIAGNOSIS — I119 Hypertensive heart disease without heart failure: Secondary | ICD-10-CM | POA: Diagnosis not present

## 2022-11-18 DIAGNOSIS — J90 Pleural effusion, not elsewhere classified: Secondary | ICD-10-CM | POA: Diagnosis not present

## 2022-11-18 DIAGNOSIS — M800AXD Age-related osteoporosis with current pathological fracture, other site, subsequent encounter for fracture with routine healing: Secondary | ICD-10-CM | POA: Diagnosis not present

## 2022-11-18 DIAGNOSIS — M8008XD Age-related osteoporosis with current pathological fracture, vertebra(e), subsequent encounter for fracture with routine healing: Secondary | ICD-10-CM | POA: Diagnosis not present

## 2022-11-18 NOTE — Progress Notes (Signed)
Care Management & Coordination Services Pharmacy Team  Reason for Encounter: Update on Recommendations   Spoke with patient on 11/23/2022   The patient had 2 BP's to report to me of 140/87  and 136/81. Discussed the importance of BP<140/90. Patient reports she has not had much morning back pain at this time, AM bg's 126. Patient is taking omeprazole  and this is controlling GI issues. Counseled patient on adding calcium and vitamin D supplement daily and went over some available products and where to find them. Patient expressed interest in the DM eye clinic so name given to Earnestine Leys for referral.   Al Corpus, PharmD notified  Burt Knack, [facility] : [facility] Clinical Pharmacy Assistant [phone]

## 2022-11-18 NOTE — Telephone Encounter (Signed)
-----   Message from Garry Heater [place], [facility] sent at 11/03/2022  1:53 PM EDT ----- Regarding: call pt Please call patient for update, how she is doing with my recommendations (see my note)  -Monitor BP 2 hrs after AM meds; goal < 140/90 -Monitor fasting BG in s/o increased pain -Add calcium supplement (combo Ca-Vit D ok) -Trial omeprazole once daily -[facility] Eye exam  - schedule w/ [facility]

## 2022-11-19 ENCOUNTER — Other Ambulatory Visit: Payer: Self-pay | Admitting: Family Medicine

## 2022-11-22 NOTE — Telephone Encounter (Signed)
Refill request for Vit B12 injections  LOV - 11/05/22 Next OV - not scheduled Last refill - 09/09/22 #3 mL/0 Last Lab - 10/01/22 and was 441

## 2022-11-23 NOTE — Telephone Encounter (Signed)
Sent. Thanks.   

## 2022-11-24 ENCOUNTER — Other Ambulatory Visit: Payer: Self-pay | Admitting: Family Medicine

## 2022-11-24 DIAGNOSIS — M8008XD Age-related osteoporosis with current pathological fracture, vertebra(e), subsequent encounter for fracture with routine healing: Secondary | ICD-10-CM | POA: Diagnosis not present

## 2022-11-24 DIAGNOSIS — J90 Pleural effusion, not elsewhere classified: Secondary | ICD-10-CM | POA: Diagnosis not present

## 2022-11-24 DIAGNOSIS — J9601 Acute respiratory failure with hypoxia: Secondary | ICD-10-CM | POA: Diagnosis not present

## 2022-11-24 DIAGNOSIS — I119 Hypertensive heart disease without heart failure: Secondary | ICD-10-CM | POA: Diagnosis not present

## 2022-11-24 DIAGNOSIS — M800AXD Age-related osteoporosis with current pathological fracture, other site, subsequent encounter for fracture with routine healing: Secondary | ICD-10-CM | POA: Diagnosis not present

## 2022-11-25 ENCOUNTER — Telehealth: Payer: Self-pay | Admitting: Pharmacist

## 2022-11-25 NOTE — Progress Notes (Signed)
Contacted patient to cancel upcoming appointment with [facility] Pharmac[facility]. Patient notes her blood pressure has been well controlled at home, and was controlled at the office when last seen. She denies any medication needs at this time.    Catie Eppie Gibson, PharmD, BCACP, CPP [facility] [phone]

## 2022-12-01 DIAGNOSIS — J9601 Acute respiratory failure with hypoxia: Secondary | ICD-10-CM | POA: Diagnosis not present

## 2022-12-01 DIAGNOSIS — M800AXD Age-related osteoporosis with current pathological fracture, other site, subsequent encounter for fracture with routine healing: Secondary | ICD-10-CM | POA: Diagnosis not present

## 2022-12-01 DIAGNOSIS — M8008XD Age-related osteoporosis with current pathological fracture, vertebra(e), subsequent encounter for fracture with routine healing: Secondary | ICD-10-CM | POA: Diagnosis not present

## 2022-12-01 DIAGNOSIS — I119 Hypertensive heart disease without heart failure: Secondary | ICD-10-CM | POA: Diagnosis not present

## 2022-12-01 DIAGNOSIS — J90 Pleural effusion, not elsewhere classified: Secondary | ICD-10-CM | POA: Diagnosis not present

## 2022-12-02 ENCOUNTER — Encounter: Payer: Medicare Other | Admitting: Pharmacist

## 2022-12-31 ENCOUNTER — Ambulatory Visit: Payer: Medicare Other | Admitting: Family Medicine

## 2023-01-02 ENCOUNTER — Telehealth: Payer: Self-pay | Admitting: Family Medicine

## 2023-01-02 DIAGNOSIS — R918 Other nonspecific abnormal finding of lung field: Secondary | ICD-10-CM

## 2023-01-02 NOTE — Telephone Encounter (Signed)
Please call patient.  Due for follow-up chest CT regarding lung nodules.  I put in the order.  She should get a call about getting scheduled.  Thanks.

## 2023-01-03 NOTE — Telephone Encounter (Signed)
Patient has appointment in office this week. She will let us know she has not received call to set up by then

## 2023-01-05 ENCOUNTER — Telehealth: Payer: Self-pay | Admitting: Family Medicine

## 2023-01-05 MED ORDER — AMLODIPINE BESYLATE 10 MG PO TABS: 10.0000 mg | ORAL_TABLET | Freq: Every day | ORAL | 3 refills | Status: DC

## 2023-01-05 NOTE — Telephone Encounter (Signed)
Patient has requested a call back whenever possible, please advise patient at [phone].

## 2023-01-05 NOTE — Telephone Encounter (Signed)
Called and spoke with patient, answered all questions about follow up CT for lung nodules Dr. Para March ordered. See other phone encounter.   Patient is also needing clarification on taking amlodipine for blood pressure. She states a few months ago she was told to increase to two tablets daily of the amlodipine due to her BP being above goal. She is unsure of who told her to do this, but this is how she has been taking her medication. She is now ready for a refill and [facility] will not fill as it is too early, her instructions still say take one tablet daily. Please advise.

## 2023-01-05 NOTE — Telephone Encounter (Signed)
Note about amlodipine increase was on 10/19/2022.  I sent a new prescription for 10 mg.  She will only take 1 pill a day with a 10 mg tablet, instead of 2 of the 5 mg.  Thanks.

## 2023-01-06 NOTE — Telephone Encounter (Signed)
Spoke with pt relaying Dr. Lianne Bushy message. Pt verbalizes understanding. States she will finish bottle of 5 mg (2 tabs daily), then will call [facility] and ask them to fill new 10 mg rx (1 tab daily).

## 2023-01-07 ENCOUNTER — Ambulatory Visit (INDEPENDENT_AMBULATORY_CARE_PROVIDER_SITE_OTHER): Payer: Medicare Other | Admitting: Family Medicine

## 2023-01-07 ENCOUNTER — Encounter: Payer: Self-pay | Admitting: Family Medicine

## 2023-01-07 VITALS — BP 142/60 | HR 64 | Temp 97.9°F | Ht 60.0 in | Wt 120.0 lb

## 2023-01-07 DIAGNOSIS — E119 Type 2 diabetes mellitus without complications: Secondary | ICD-10-CM | POA: Diagnosis not present

## 2023-01-07 DIAGNOSIS — H00019 Hordeolum externum unspecified eye, unspecified eyelid: Secondary | ICD-10-CM | POA: Diagnosis not present

## 2023-01-07 DIAGNOSIS — R918 Other nonspecific abnormal finding of lung field: Secondary | ICD-10-CM

## 2023-01-07 DIAGNOSIS — G4700 Insomnia, unspecified: Secondary | ICD-10-CM

## 2023-01-07 DIAGNOSIS — M549 Dorsalgia, unspecified: Secondary | ICD-10-CM | POA: Diagnosis not present

## 2023-01-07 DIAGNOSIS — F411 Generalized anxiety disorder: Secondary | ICD-10-CM

## 2023-01-07 DIAGNOSIS — G720 Drug-induced myopathy: Secondary | ICD-10-CM

## 2023-01-07 MED ORDER — LORAZEPAM 2 MG PO TABS: ORAL_TABLET | ORAL | 1 refills | Status: DC

## 2023-01-07 NOTE — Assessment & Plan Note (Signed)
Discussed taking lower dose of lorazepam, daily.  She tapered down to 2 tabs a day but couldn't tolerate a lower dose.  D/w pt.  BZD cautions d/w pt.  Continue sertraline.

## 2023-01-07 NOTE — Assessment & Plan Note (Signed)
  D/w pt about warm compresses for stye on the L lower lid.

## 2023-01-07 NOTE — Assessment & Plan Note (Signed)
F/u imaging pending.

## 2023-01-07 NOTE — Patient Instructions (Addendum)
Let me know what OTC med you are using for sleep and we'll update your list here at clinic.  Go to the lab on the way out.   If you have mychart we'll likely use that to update you.    I'll await your scan report.   Try warm compresses on your eyelid.    Take care.  Glad to see you.

## 2023-01-07 NOTE — Progress Notes (Signed)
D/w pt about prev imaging.   At least 2 right middle lobe pulmonary nodules, the largest 7 mm. Non-contrast chest CT at 3-6 months is recommended. If the nodules are stable at time of repeat CT, then future CT at 18-24 months (from today's scan) is considered optional for low-risk patients, but is recommended for high-risk patients.   She couldn't tolerate trazodone.  That seemed to keep her await.  Unrecalled OTC helped.  D/w pt.  I asked her to update [place] about that med for her list.   Discussed taking lower dose of lorazepam, daily.  She tapered down to 2 tabs a day but couldn't tolerate a lower dose.  D/w pt.  BZD cautions d/w pt.    Diabetes:  Using medications without difficulties: yes Hypoglycemic episodes: no Hyperglycemic episodes: no sx Feet problems: no Blood Sugars averaging:not checked often eye exam within last year: due, d/w pt.   Statin intolerant.    Heating pad helps with lower back pain. More pain after being up for 4-5 hours.  Prev imaging with kyphoplasty d/w pt.   D/w pt about warm compresses for stye on the L lower lid.   Meds, vitals, and allergies reviewed.   ROS: Per HPI unless specifically indicated in ROS section   GEN: nad, alert and oriented HEENT: ncat, stye on the L lower lid.  NECK: supple w/o LA CV: rrr. PULM: ctab, no inc wob ABD: soft, +bs EXT: no edema SKIN: no acute rash  Diabetic foot exam: Normal inspection No skin breakdown No calluses  Normal DP pulses Normal sensation to light touch and monofilament Nails normal

## 2023-01-07 NOTE — Assessment & Plan Note (Signed)
Continue metformin, see notes on labs.

## 2023-01-07 NOTE — Assessment & Plan Note (Signed)
Statin intolerant 

## 2023-01-07 NOTE — Assessment & Plan Note (Signed)
Heating pad helps with lower back pain. More pain after being up for 4-5 hours.  Prev imaging with kyphoplasty d/w pt. Continue as is.

## 2023-01-07 NOTE — Assessment & Plan Note (Signed)
She couldn't tolerate trazodone.  That seemed to keep her await.  Unrecalled OTC helped.  D/w pt.  I asked her to update [place] about that med for her list.

## 2023-01-09 ENCOUNTER — Other Ambulatory Visit: Payer: Self-pay | Admitting: Family Medicine

## 2023-01-09 MED ORDER — METFORMIN HCL 500 MG PO TABS: ORAL_TABLET | ORAL | 1 refills | Status: DC

## 2023-01-10 ENCOUNTER — Telehealth: Payer: Self-pay | Admitting: Family Medicine

## 2023-01-10 ENCOUNTER — Other Ambulatory Visit (HOSPITAL_BASED_OUTPATIENT_CLINIC_OR_DEPARTMENT_OTHER): Payer: Medicare Other

## 2023-01-10 NOTE — Telephone Encounter (Signed)
Pt called stating during visit with Para March, she was told to let him know the name of the sleeping pills she was currently taking. Name of meds is "Equate Sleep Aid" from [place]. Call back # [phone]

## 2023-01-11 MED ORDER — DIPHENHYDRAMINE HCL 25 MG PO CAPS: 25.0000 mg | ORAL_CAPSULE | Freq: Every evening | ORAL | Status: DC | PRN

## 2023-01-11 NOTE — Addendum Note (Signed)
Addended by: Joaquim Nam on: 01/11/2023 09:15 AM   Modules accepted: Orders

## 2023-01-11 NOTE — Telephone Encounter (Signed)
Noted. Med list updated. Thanks

## 2023-01-17 ENCOUNTER — Ambulatory Visit (HOSPITAL_BASED_OUTPATIENT_CLINIC_OR_DEPARTMENT_OTHER): Payer: Medicare Other

## 2023-01-18 ENCOUNTER — Ambulatory Visit (HOSPITAL_BASED_OUTPATIENT_CLINIC_OR_DEPARTMENT_OTHER)
Admission: RE | Admit: 2023-01-18 | Discharge: 2023-01-18 | Disposition: A | Discharge status: Home / Self Care | Payer: Medicare Other | Source: Ambulatory Visit | Attending: Family Medicine | Admitting: Family Medicine

## 2023-01-18 DIAGNOSIS — R918 Other nonspecific abnormal finding of lung field: Secondary | ICD-10-CM | POA: Insufficient documentation

## 2023-01-18 DIAGNOSIS — J984 Other disorders of lung: Secondary | ICD-10-CM | POA: Diagnosis not present

## 2023-01-18 DIAGNOSIS — I700 Atherosclerosis of aorta: Secondary | ICD-10-CM | POA: Diagnosis not present

## 2023-01-19 ENCOUNTER — Other Ambulatory Visit: Payer: Self-pay | Admitting: Family Medicine

## 2023-01-26 ENCOUNTER — Telehealth: Payer: Self-pay

## 2023-01-26 NOTE — Telephone Encounter (Addendum)
Called and left a voice message asking patient to give the office a call back in regards to her recent CT chest results. Will try calling again   ----- Message from Crawford Givens sent at 01/25/2023  6:50 AM EDT ----- Please update patient.  Good news.  Prev lung nodules resolved. No follow up needed.

## 2023-01-28 ENCOUNTER — Other Ambulatory Visit: Payer: Self-pay | Admitting: Family Medicine

## 2023-03-04 ENCOUNTER — Other Ambulatory Visit: Payer: Self-pay | Admitting: Family Medicine

## 2023-03-04 NOTE — Telephone Encounter (Signed)
LAST APPOINTMENT DATE: 01/07/23   NEXT APPOINTMENT DATE: 04/04/2023    LAST REFILL:01/07/23  QTY: #[phone]

## 2023-03-21 ENCOUNTER — Other Ambulatory Visit: Payer: Self-pay | Admitting: Family Medicine

## 2023-04-04 ENCOUNTER — Ambulatory Visit: Payer: Medicare Other | Admitting: Family Medicine

## 2023-04-04 DIAGNOSIS — E119 Type 2 diabetes mellitus without complications: Secondary | ICD-10-CM

## 2023-04-05 ENCOUNTER — Encounter: Payer: Self-pay | Admitting: Family Medicine

## 2023-04-08 ENCOUNTER — Ambulatory Visit: Payer: Medicare Other | Admitting: Family Medicine

## 2023-04-18 ENCOUNTER — Other Ambulatory Visit: Payer: Self-pay | Admitting: Family Medicine

## 2023-04-22 ENCOUNTER — Ambulatory Visit: Payer: Medicare Other | Admitting: Family Medicine

## 2023-04-28 ENCOUNTER — Other Ambulatory Visit: Payer: Self-pay | Admitting: Family Medicine

## 2023-04-29 NOTE — Telephone Encounter (Signed)
Last office visit: 01/07/23  Next office visit: 05/06/23 Last refill: 03/05/23 LORAZEPAM TAB 2MG * QTY: 60 TABLETS WITH 1 REFILL

## 2023-05-01 NOTE — Telephone Encounter (Signed)
Sent. Thanks.   

## 2023-05-06 ENCOUNTER — Ambulatory Visit: Payer: Medicare Other | Admitting: Family Medicine

## 2023-05-23 ENCOUNTER — Other Ambulatory Visit: Payer: Self-pay | Admitting: Family Medicine

## 2023-05-24 NOTE — Telephone Encounter (Signed)
Sent. Thanks.   

## 2023-06-04 ENCOUNTER — Other Ambulatory Visit: Payer: Self-pay | Admitting: Family Medicine

## 2023-06-04 DIAGNOSIS — R350 Frequency of micturition: Secondary | ICD-10-CM | POA: Diagnosis not present

## 2023-06-04 DIAGNOSIS — R300 Dysuria: Secondary | ICD-10-CM | POA: Diagnosis not present

## 2023-06-07 NOTE — Telephone Encounter (Signed)
Sent. Thanks.   

## 2023-06-27 ENCOUNTER — Other Ambulatory Visit: Payer: Self-pay | Admitting: Family Medicine

## 2023-06-29 NOTE — Telephone Encounter (Signed)
Sent with note to schedule OV.

## 2023-07-19 ENCOUNTER — Other Ambulatory Visit: Payer: Self-pay | Admitting: Family Medicine

## 2023-07-21 NOTE — Telephone Encounter (Signed)
 Sent. Thanks.

## 2023-07-21 NOTE — Telephone Encounter (Signed)
 Last office Visit: 01/07/23 Next office visit: nothing scheduled Last refill date:  sertraline  (ZOLOFT ) 50 MG tablet 04/18/23 132 tablets 1 refill

## 2023-07-25 ENCOUNTER — Other Ambulatory Visit: Payer: Self-pay | Admitting: Family Medicine

## 2023-07-28 ENCOUNTER — Telehealth: Payer: Self-pay

## 2023-07-28 MED ORDER — LORAZEPAM 2 MG PO TABS
1.0000 mg | ORAL_TABLET | Freq: Two times a day (BID) | ORAL | 0 refills | Status: DC | PRN
Start: 2023-07-28 — End: 2023-08-08

## 2023-07-28 NOTE — Telephone Encounter (Signed)
Copied from CRM [id number]. Topic: Clinical - Medication Question >> Jul 27, 2023  5:29 PM Denese Killings wrote: Reason for CRM: Patient wants to know if she can get enough medication for LORazepam called in to last until her appointment  on 02/24. Patient is requesting a callback.

## 2023-07-28 NOTE — Telephone Encounter (Signed)
Please advise patient last seen 01/10/23 since then she has canceled 3 appointment and no show 1

## 2023-07-28 NOTE — Addendum Note (Signed)
Addended by: Joaquim Nam on: 07/28/2023 05:59 PM   Modules accepted: Orders

## 2023-07-28 NOTE — Telephone Encounter (Signed)
Sent.  Will see at OV.  Thanks.

## 2023-07-29 ENCOUNTER — Other Ambulatory Visit: Payer: Self-pay | Admitting: Family Medicine

## 2023-07-29 NOTE — Telephone Encounter (Signed)
Patient notified

## 2023-08-04 ENCOUNTER — Telehealth: Payer: Self-pay | Admitting: *Deleted

## 2023-08-04 NOTE — Telephone Encounter (Signed)
Copied from CRM [id number]. Topic: General - Other >> Aug 04, 2023  2:06 PM Irine Seal wrote: Reason for CRM: Patient's daughter, Dorina Hoyer, called asking for the provider's email to send personal information ahead of Monday's appointment. She refused to share specifics but mentioned that the message is important to ensure the provider isn't caught off guard. I offered to relay the message to the clinical staff, but she preferred not to go that route. After calling CAL, Delaney Meigs confirmed that MyChart is an option, but the daughter isn't sure how to use it. She requested a callback, stressing that the message is very [phone]

## 2023-08-04 NOTE — Telephone Encounter (Signed)
Left message for daughter Dorina Hoyer to return call to office.

## 2023-08-05 NOTE — Telephone Encounter (Signed)
Left message for daughter Dorina Hoyer to return call to office.

## 2023-08-08 ENCOUNTER — Ambulatory Visit (INDEPENDENT_AMBULATORY_CARE_PROVIDER_SITE_OTHER)
Admission: RE | Admit: 2023-08-08 | Discharge: 2023-08-08 | Disposition: A | Discharge status: Home / Self Care | Payer: Medicare Other | Source: Ambulatory Visit | Attending: Family Medicine | Admitting: Family Medicine

## 2023-08-08 ENCOUNTER — Ambulatory Visit (INDEPENDENT_AMBULATORY_CARE_PROVIDER_SITE_OTHER): Payer: Medicare Other | Admitting: Family Medicine

## 2023-08-08 VITALS — BP 134/72 | HR 74 | Temp 97.6°F

## 2023-08-08 DIAGNOSIS — M549 Dorsalgia, unspecified: Secondary | ICD-10-CM

## 2023-08-08 DIAGNOSIS — E538 Deficiency of other specified B group vitamins: Secondary | ICD-10-CM

## 2023-08-08 DIAGNOSIS — Z7984 Long term (current) use of oral hypoglycemic drugs: Secondary | ICD-10-CM | POA: Diagnosis not present

## 2023-08-08 DIAGNOSIS — F411 Generalized anxiety disorder: Secondary | ICD-10-CM

## 2023-08-08 DIAGNOSIS — R300 Dysuria: Secondary | ICD-10-CM

## 2023-08-08 DIAGNOSIS — S32000D Wedge compression fracture of unspecified lumbar vertebra, subsequent encounter for fracture with routine healing: Secondary | ICD-10-CM

## 2023-08-08 DIAGNOSIS — M545 Low back pain, unspecified: Secondary | ICD-10-CM | POA: Diagnosis not present

## 2023-08-08 DIAGNOSIS — M4856XA Collapsed vertebra, not elsewhere classified, lumbar region, initial encounter for fracture: Secondary | ICD-10-CM | POA: Diagnosis not present

## 2023-08-08 DIAGNOSIS — D509 Iron deficiency anemia, unspecified: Secondary | ICD-10-CM | POA: Diagnosis not present

## 2023-08-08 DIAGNOSIS — E119 Type 2 diabetes mellitus without complications: Secondary | ICD-10-CM

## 2023-08-08 MED ORDER — LORAZEPAM 2 MG PO TABS
1.0000 mg | ORAL_TABLET | Freq: Three times a day (TID) | ORAL | 0 refills | Status: DC | PRN
Start: 2023-08-08 — End: 2023-09-08

## 2023-08-08 NOTE — Progress Notes (Unsigned)
 Insurance form done at OV, d/w pt.   DM2.  Had been taking metformin 2 AM and 1 PM.  Vomited today.  Ongoing nausea, ie not acute.  Due for labs.  Sugar 130 today at lunch, usually had been ~170.  She can get "shaky" attributed to anxiety, d/w pt about checking sugar when possible.  Some dysuria.  See notes on labs.    Constant lower back pain.  Going on for months. [place] last summer. B lower back and midline pain.  Xray pending.   Patient reportedly sent money to a person allegedly in [place] that needed funds to pay back taxes.  She has never met this man.  This sounds like a scam, family prev reported this to police.  I encouraged her not to contact or send money to this individual.  Discussed that scams are common, unfortunately.    Meds, vitals, and allergies reviewed.   ROS: Per HPI unless specifically indicated in ROS section   GEN: nad, alert and oriented but anxious appearing.  HEENT: mucous membranes moist NECK: supple w/o LA CV: rrr. PULM: ctab, no inc wob ABD: soft, +bs EXT: no edema SKIN: well perfused.   45 minutes were devoted to patient care in this encounter (this includes time spent reviewing the patient's file/history, interviewing and examining the patient, counseling/reviewing plan with patient).

## 2023-08-08 NOTE — Patient Instructions (Addendum)
 Go to the lab on the way out.   If you have mychart we'll likely use that to update you.    Take care.  Glad to see you. Cut back to 1 metformin twice a day.

## 2023-08-08 NOTE — Telephone Encounter (Signed)
LM to return call to discuss.

## 2023-08-09 ENCOUNTER — Encounter: Payer: Self-pay | Admitting: Family Medicine

## 2023-08-09 LAB — COMPREHENSIVE METABOLIC PANEL
ALT: 7 U/L (ref 0–35)
AST: 14 U/L (ref 0–37)
Albumin: 4.1 g/dL (ref 3.5–5.2)
Alkaline Phosphatase: 51 U/L (ref 39–117)
BUN: 15 mg/dL (ref 6–23)
CO2: 25 meq/L (ref 19–32)
Calcium: 9.6 mg/dL (ref 8.4–10.5)
Chloride: 100 meq/L (ref 96–112)
Creatinine, Ser: 0.72 mg/dL (ref 0.40–1.20)
GFR: 76.74 mL/min (ref >60.00)
Glucose, Bld: 187 mg/dL — ABNORMAL HIGH (ref 70–99)
Potassium: 4.3 meq/L (ref 3.5–5.1)
Sodium: 137 meq/L (ref 135–145)
Total Bilirubin: 0.5 mg/dL (ref 0.2–1.2)
Total Protein: 7.4 g/dL (ref 6.0–8.3)

## 2023-08-09 LAB — URINALYSIS, ROUTINE W REFLEX MICROSCOPIC
Bilirubin Urine: NEGATIVE
Hgb urine dipstick: NEGATIVE
Nitrite: NEGATIVE
RBC / HPF: NONE SEEN (ref 0–?)
Specific Gravity, Urine: >=1.03 — AB (ref 1.000–1.030)
Urine Glucose: NEGATIVE
Urobilinogen, UA: 0.2 (ref 0.0–1.0)
pH: 5.5 (ref 5.0–8.0)

## 2023-08-09 LAB — CBC WITH DIFFERENTIAL/PLATELET
Basophils Absolute: 0.1 10*3/uL (ref 0.0–0.1)
Basophils Relative: 0.9 % (ref 0.0–3.0)
Eosinophils Absolute: 0.1 10*3/uL (ref 0.0–0.7)
Eosinophils Relative: 0.8 % (ref 0.0–5.0)
HCT: 39.6 % (ref 36.0–46.0)
Hemoglobin: 13.1 g/dL (ref 12.0–15.0)
Lymphocytes Relative: 10 % — ABNORMAL LOW (ref 12.0–46.0)
Lymphs Abs: 1 10*3/uL (ref 0.7–4.0)
MCHC: 33.1 g/dL (ref 30.0–36.0)
MCV: 91.1 fL (ref 78.0–100.0)
Monocytes Absolute: 0.5 10*3/uL (ref 0.1–1.0)
Monocytes Relative: 5.2 % (ref 3.0–12.0)
Neutro Abs: 8.7 10*3/uL — ABNORMAL HIGH (ref 1.4–7.7)
Neutrophils Relative %: 83.1 % — ABNORMAL HIGH (ref 43.0–77.0)
Platelets: 371 10*3/uL (ref 150.0–400.0)
RBC: 4.34 Mil/uL (ref 3.87–5.11)
RDW: 13.5 % (ref 11.5–15.5)
WBC: 10.4 10*3/uL (ref 4.0–10.5)

## 2023-08-09 LAB — HEMOGLOBIN A1C: Hgb A1c MFr Bld: 7.7 % — ABNORMAL HIGH (ref 4.6–6.5)

## 2023-08-09 LAB — LIPID PANEL
Cholesterol: 154 mg/dL (ref 0–200)
HDL: 35.2 mg/dL — ABNORMAL LOW (ref >39.00)
LDL Cholesterol: 76 mg/dL (ref 0–99)
NonHDL: 118.88
Total CHOL/HDL Ratio: 4
Triglycerides: 214 mg/dL — ABNORMAL HIGH (ref 0.0–149.0)
VLDL: 42.8 mg/dL — ABNORMAL HIGH (ref 0.0–40.0)

## 2023-08-09 LAB — MICROALBUMIN / CREATININE URINE RATIO
Creatinine,U: 183.5 mg/dL
Microalb Creat Ratio: 32.9 mg/g — ABNORMAL HIGH (ref 0.0–30.0)
Microalb, Ur: 6 mg/dL — ABNORMAL HIGH (ref 0.0–1.9)

## 2023-08-09 LAB — TSH: TSH: 1.1 u[IU]/mL (ref 0.35–5.50)

## 2023-08-09 LAB — FERRITIN: Ferritin: 41.4 ng/mL (ref 10.0–291.0)

## 2023-08-09 LAB — VITAMIN D 25 HYDROXY (VIT D DEFICIENCY, FRACTURES): VITD: 54.45 ng/mL (ref 30.00–100.00)

## 2023-08-09 LAB — VITAMIN B12: Vitamin B-12: 783 pg/mL (ref 211–911)

## 2023-08-10 ENCOUNTER — Other Ambulatory Visit: Payer: Self-pay | Admitting: Family Medicine

## 2023-08-10 LAB — URINE CULTURE
MICRO NUMBER:: 16119934
SPECIMEN QUALITY:: ADEQUATE

## 2023-08-10 MED ORDER — CEPHALEXIN 500 MG PO CAPS
500.0000 mg | ORAL_CAPSULE | Freq: Three times a day (TID) | ORAL | 0 refills | Status: DC
Start: 2023-08-10 — End: 2023-10-07

## 2023-08-10 NOTE — Assessment & Plan Note (Signed)
See notes on imaging. 

## 2023-08-10 NOTE — Assessment & Plan Note (Signed)
 Stressor with scam d/w pt.  See above.  Continue sertraline.  Can take an extra 1/2 tab of lorazepam if needed, cautions d/w pt.  Rx sent.

## 2023-08-10 NOTE — Assessment & Plan Note (Signed)
 Cut back to 1 metformin twice a day.  Unclear if metformin is contributing to recent GI sx.  See notes on labs.

## 2023-08-10 NOTE — Assessment & Plan Note (Signed)
 See notes on labs.

## 2023-08-16 ENCOUNTER — Other Ambulatory Visit: Payer: Self-pay | Admitting: Family Medicine

## 2023-08-21 ENCOUNTER — Other Ambulatory Visit: Payer: Self-pay | Admitting: Family Medicine

## 2023-08-31 ENCOUNTER — Other Ambulatory Visit: Payer: Self-pay | Admitting: Family Medicine

## 2023-08-31 ENCOUNTER — Telehealth: Payer: Self-pay | Admitting: Family Medicine

## 2023-08-31 DIAGNOSIS — S32000D Wedge compression fracture of unspecified lumbar vertebra, subsequent encounter for fracture with routine healing: Secondary | ICD-10-CM

## 2023-08-31 NOTE — Telephone Encounter (Signed)
 Copied from CRM [id number]. Topic: Clinical - Medical Advice >> Aug 30, 2023  4:44 PM Denese Killings wrote: Reason for CRM: Patient daughter is requesting a callback from nurse tomorrow to give to Dr. Para March regarding moms love scam. Patient daughter feels that it has gotten worse and they may have to take over for her.

## 2023-09-01 NOTE — Telephone Encounter (Signed)
 Left message for patients daughter to return call. She is on [facility]

## 2023-09-02 NOTE — Telephone Encounter (Signed)
 Spoke with patients daughter and she wanted to let you know that the things that her mom is doing is getting worse. She is not sure what else she can do she needs some recommendations. Her mom is still sending her entire check to a person  and that she has been attempting drive and forget things.

## 2023-09-03 ENCOUNTER — Other Ambulatory Visit: Payer: Self-pay | Admitting: Family Medicine

## 2023-09-04 NOTE — Telephone Encounter (Signed)
 I think she needs a follow-up office visit.  I need to be able to talk with the patient about all of this.

## 2023-09-05 NOTE — Telephone Encounter (Signed)
 Called patient daughter and left vm to advise that Dr. Para March wants her mom to be seen in office

## 2023-09-06 ENCOUNTER — Other Ambulatory Visit: Payer: Self-pay | Admitting: Family Medicine

## 2023-09-08 ENCOUNTER — Other Ambulatory Visit: Payer: Self-pay | Admitting: Family Medicine

## 2023-09-08 NOTE — Telephone Encounter (Signed)
 Copied from CRM [id number]. Topic: General - Other >> Sep 07, 2023 11:53 AM Gurney Maxin H wrote: Reason for CRM: Patient called to check status of her LORazepam (ATIVAN) 2 MG tablet, advised patient medication request is pending give 24-48 hours to process >> Sep 08, 2023 11:40 AM Eunice Blase wrote: Pt called requesting approval for LORazepam (ATIVAN) 2 MG tablet.  Called pt and advised her that refill has been sent to Dr. Para March for signature.  Explained that since this is a controlled medication he has to sign the prescription.  Pt verbalized understanding.

## 2023-09-08 NOTE — Telephone Encounter (Signed)
 Sent. Thanks.

## 2023-09-08 NOTE — Telephone Encounter (Unsigned)
 Copied from CRM [id number]. Topic: Clinical - Medication Refill >> Sep 08, 2023  1:25 PM Ernst Spell wrote: Most Recent Primary Care Visit:  Provider: Joaquim Nam  Department: [facility]  Visit Type: OFFICE VISIT  Date: 08/08/2023  Medication: lorazepam  Has the patient contacted their pharmacy? Yes They told her to call the office.  Is this the correct pharmacy for this prescription? Yes This is the patient's preferred pharmacy:  [facility] - [place], [place] - [address] [place] [address] Phone: [phone] Fax: [phone]   Has the prescription been filled recently? Yes  Is the patient out of the medication? Yes  Has the patient been seen for an appointment in the last year OR does the patient have an upcoming appointment? Yes  Can we respond through MyChart? No  Agent: Please be advised that Rx refills may take up to 3 business days. We ask that you follow-up with your pharmacy. >> Sep 08, 2023  3:21 PM Eunice Blase wrote: Pt's daughter calling for refill of medication.

## 2023-09-08 NOTE — Telephone Encounter (Unsigned)
 Copied from CRM [id number]. Topic: Clinical - Medication Refill >> Sep 08, 2023  1:25 PM Ernst Spell wrote: Most Recent Primary Care Visit:  Provider: Joaquim Nam  Department: [facility]  Visit Type: OFFICE VISIT  Date: 08/08/2023  Medication: lorazepam  Has the patient contacted their pharmacy? Yes They told her to call the office.  Is this the correct pharmacy for this prescription? Yes This is the patient's preferred pharmacy:  [facility] [place], [place] - [address] [place] [address] Phone: [phone] Fax: [phone]   Has the prescription been filled recently? Yes  Is the patient out of the medication? Yes  Has the patient been seen for an appointment in the last year OR does the patient have an upcoming appointment? Yes  Can we respond through MyChart? No  Agent: Please be advised that Rx refills may take up to 3 business days. We ask that you follow-up with your pharmacy.

## 2023-09-12 NOTE — Telephone Encounter (Signed)
 Left voicemail for patient to return call to office.

## 2023-09-13 NOTE — Telephone Encounter (Signed)
 Left vm for patients daughter for her to call and schedule an appointment for Dr. Para March to see her mom

## 2023-09-15 ENCOUNTER — Encounter: Payer: Self-pay | Admitting: Family Medicine

## 2023-09-15 NOTE — Telephone Encounter (Signed)
 Several attempts made to reach patient to schedule in office appointment. Letter mailed

## 2023-09-21 ENCOUNTER — Other Ambulatory Visit: Payer: Self-pay | Admitting: Family Medicine

## 2023-09-21 NOTE — Telephone Encounter (Signed)
 Agreed.  I denied the rx.  Was the an autorefill request from pharmacy?

## 2023-09-21 NOTE — Telephone Encounter (Signed)
 LOV: 08/08/23 [id number] SCHEDULED LAST REFILL: TRAZODONE TAB 50MG      This medication is showing up on her allergy list

## 2023-09-22 ENCOUNTER — Telehealth: Payer: Self-pay | Admitting: Family Medicine

## 2023-09-22 NOTE — Telephone Encounter (Signed)
 Copied from CRM [id number]. Topic: General - Other >> Sep 22, 2023  2:56 PM Aletta Edouard wrote: Reason for CRM: patient daughter  is needing a call back from the nurse

## 2023-09-22 NOTE — Telephone Encounter (Signed)
 Returned call to patients daughter and scheduled her mom to come in to be seen in reference to her mental state

## 2023-09-22 NOTE — Telephone Encounter (Signed)
 Noted. Thanks.

## 2023-09-22 NOTE — Telephone Encounter (Signed)
 Yes it was an autofill

## 2023-09-26 ENCOUNTER — Ambulatory Visit: Admitting: Family Medicine

## 2023-09-26 ENCOUNTER — Other Ambulatory Visit: Payer: Self-pay | Admitting: Family Medicine

## 2023-09-26 ENCOUNTER — Telehealth: Payer: Self-pay

## 2023-09-26 NOTE — Telephone Encounter (Signed)
 Copied from CRM [id number]. Topic: Clinical - Medication Question >> Sep 26, 2023 12:26 PM [name] wrote: Reason for CRM: patient calling in. MRI scheduled 09/30/23 patient is requesting medication for claustrophobia and stated she called in on Friday 09/23/23 and had canceled the appt  for 09/26/23  Called CAL due to >> Sep 26, 2023 12:39 PM [name] wrote: Patient is very confused, and is getting appts mixed up.

## 2023-09-26 NOTE — Telephone Encounter (Signed)
 Left voicemail for patient to return call to office.

## 2023-09-26 NOTE — Telephone Encounter (Signed)
 Pt called in stating she was unaware of her appt today, 4/14 with Dr. Vallarie Gauze @ 11am. Pt states she is requesting medication for claustrophobia for when she has her MRI soon? Please advise. Call back # [phone].

## 2023-09-26 NOTE — Telephone Encounter (Signed)
 I would use a dose of lorazepam just prior to the MRI.  She needs a driver.

## 2023-09-28 ENCOUNTER — Telehealth: Payer: Self-pay | Admitting: Family Medicine

## 2023-09-28 NOTE — Telephone Encounter (Signed)
 Patient called again returning AV's Call.

## 2023-09-28 NOTE — Telephone Encounter (Signed)
 Left voicemail for patient to return call to office.

## 2023-09-28 NOTE — Telephone Encounter (Signed)
 Copied from CRM [id number]. Topic: General - Call Back - No Documentation >> Sep 28, 2023 11:57 AM Freya Jesus wrote: Reason for CRM: Patient returning call from [place]. Called CAL and was advised currently with patient and to send a message for a call back.

## 2023-09-28 NOTE — Telephone Encounter (Signed)
 Returned call to patient and advised of Dr. Azell Boll recommendation

## 2023-09-28 NOTE — Telephone Encounter (Signed)
 Closing encounter. This is being addressed in another encounter

## 2023-09-28 NOTE — Telephone Encounter (Signed)
 Closing encounter. Notes are documented in prior encounter.

## 2023-09-30 ENCOUNTER — Ambulatory Visit
Admission: RE | Admit: 2023-09-30 | Discharge: 2023-09-30 | Disposition: A | Discharge status: Home / Self Care | Source: Ambulatory Visit | Attending: Family Medicine | Admitting: Family Medicine

## 2023-09-30 DIAGNOSIS — M47816 Spondylosis without myelopathy or radiculopathy, lumbar region: Secondary | ICD-10-CM | POA: Diagnosis not present

## 2023-09-30 DIAGNOSIS — S32000D Wedge compression fracture of unspecified lumbar vertebra, subsequent encounter for fracture with routine healing: Secondary | ICD-10-CM

## 2023-09-30 DIAGNOSIS — M5126 Other intervertebral disc displacement, lumbar region: Secondary | ICD-10-CM | POA: Diagnosis not present

## 2023-09-30 DIAGNOSIS — M4856XD Collapsed vertebra, not elsewhere classified, lumbar region, subsequent encounter for fracture with routine healing: Secondary | ICD-10-CM | POA: Diagnosis not present

## 2023-10-04 ENCOUNTER — Ambulatory Visit: Admitting: Family Medicine

## 2023-10-07 ENCOUNTER — Encounter: Payer: Self-pay | Admitting: Family Medicine

## 2023-10-07 ENCOUNTER — Ambulatory Visit (INDEPENDENT_AMBULATORY_CARE_PROVIDER_SITE_OTHER): Admitting: Family Medicine

## 2023-10-07 VITALS — BP 128/74 | HR 57 | Temp 98.5°F | Ht 60.0 in | Wt 124.2 lb

## 2023-10-07 DIAGNOSIS — R300 Dysuria: Secondary | ICD-10-CM

## 2023-10-07 DIAGNOSIS — M549 Dorsalgia, unspecified: Secondary | ICD-10-CM | POA: Diagnosis not present

## 2023-10-07 DIAGNOSIS — R413 Other amnesia: Secondary | ICD-10-CM | POA: Diagnosis not present

## 2023-10-07 DIAGNOSIS — M810 Age-related osteoporosis without current pathological fracture: Secondary | ICD-10-CM

## 2023-10-07 LAB — URINALYSIS, ROUTINE W REFLEX MICROSCOPIC
Hgb urine dipstick: NEGATIVE
Nitrite: POSITIVE — AB
Specific Gravity, Urine: 1.025 (ref 1.000–1.030)
Total Protein, Urine: 30 — AB
Urine Glucose: 250 — AB
Urobilinogen, UA: 1 (ref 0.0–1.0)
pH: 6 (ref 5.0–8.0)

## 2023-10-07 MED ORDER — CEPHALEXIN 500 MG PO CAPS: 500.0000 mg | ORAL_CAPSULE | Freq: Three times a day (TID) | ORAL | 0 refills | Status: DC

## 2023-10-07 MED ORDER — GABAPENTIN 100 MG PO CAPS: 100.0000 mg | ORAL_CAPSULE | Freq: Every day | ORAL | 1 refills | Status: DC | PRN

## 2023-10-07 NOTE — Patient Instructions (Signed)
 Try gabapentin  at night for pain.  Start with 1 tab and increase 2 tabs if needed. Sedation caution.  Let me know if that isn't helping.    You could try taking it in the morning if needed, instead of at night.    Drink plenty of water and start the antibiotics today.  We'll contact you with your lab report.  Take care.

## 2023-10-07 NOTE — Progress Notes (Signed)
 No recent falls.  Using a cane at baseline.   MRI reviewed with patient with L2 findings noted. Persistent pain but in variable locations.  Sitting on heating pad helps. More pain standing, less pain sitting.  Discussed that I need full MRI report.  Dysuria recently noted.  Change in urine odor.  Burning with urination.    Memory d/w pt.  She denies sending money to her contact in the meantime. Some occ short term changes but not dramatic acute variation.  See MMSE below.  Meds, vitals, and allergies reviewed.   ROS: Per HPI unless specifically indicated in ROS section   Nad Ncat Neck supple, no LA Rrr Ctab Abd soft, not ttp Skin well-perfused. No lower extremity edema.  MMSE 28 out of 30.  -1 for the day of the month and -1 for attention.

## 2023-10-09 ENCOUNTER — Telehealth: Payer: Self-pay | Admitting: Family Medicine

## 2023-10-09 ENCOUNTER — Encounter: Payer: Self-pay | Admitting: Family Medicine

## 2023-10-09 NOTE — Assessment & Plan Note (Signed)
 Discussed options. Try gabapentin  at night for pain.  Would try to avoid opiates.  Routine cautions given to patient.  Start with 1 tab and increase 2 tabs if needed. Sedation caution.  Let me know if that isn't helping.    She could try taking it in the morning if needed, instead of at night.

## 2023-10-09 NOTE — Telephone Encounter (Signed)
 I would greatly appreciate your input.  This patient has a history of a femur fracture years ago in the setting of Fosamax use.  She has osteoporosis but my concern is about the risk of atypical fractures.  I would appreciate your input regarding Prolia versus other nonbisphosphonate options. Many thanks.

## 2023-10-09 NOTE — Assessment & Plan Note (Signed)
 See following phone note.  Will consider options in the meantime.

## 2023-10-09 NOTE — Assessment & Plan Note (Signed)
 MMSE 28 out of 30.  -1 for the day of the month and -1 for attention.  MMSE overall reassuring today.  I think it makes sense to monitor in the meantime.

## 2023-10-09 NOTE — Assessment & Plan Note (Signed)
 See notes on labs and start Keflex  in the meantime.  Routine cautions given to patient.

## 2023-10-10 ENCOUNTER — Encounter: Payer: Self-pay | Admitting: Pharmacist

## 2023-10-10 NOTE — Progress Notes (Signed)
 Clinical Chart Review  CC: Osteoporosis considerations  HPI: Fracture Hx: Hip/rib fractures; Hx atypical fracture while on fosamax Ca/Vit D: Reported diet is low in calcium . Only taking a Vitamin D  supplement Medication Considerations: On high dose omeprazole  BID and denies any issues with GERD. PCP has discussed possible PPI taper.   Objective DEXA:  - 2017    AP Spine L2-L4 is 0.666 g/cm2, T-score -4.4 w significant change from previous in 2015;  L femur -2.2    AACE Clinical Guidance AFF risk across the board is low though is associated with bisphosphonate, Prolia, Evenity. Per gurrent guidelines: "Subtrochanteric femur fractures are also seen in patients with low BMD not on bisphosphonates and with other therapies for osteoporosis, such as denosumab. A causal relationship has not been established. Because these fractures can occur in patients not on any treatment, unless a new drug for osteoporosis prevents this type of fracture, "atypical" fractures will be seen eventually with any agent. Interestingly, a recent cohort study suggested that these fractures are not associated with excess mortality. There is evidence that using anabolic therapy when AFF is diagnosed accelerates fracture healing.    PTH-analog - Reasonable agent in the setting of severely depressed T-score in spine plus hx multiple fracture. Given it is anabolic in nature, AFF risk does not apply. However, ideally PTH analogue would be followed with antiresorptive therapy such as bisphosphonate or  Prolia, so the concern for AFF would technically present later on. Promisingly, Studies have shown that the risk of subsequent AFF decreases each consecutive year from last antiresorptive use.   Given the alternative option would be to consider not treating, or using another agent which does have risk of AFF, PTH would likely be the most reasonable option with re-consideration of Prolia or similar after 2 years of PTH depending on  patient's goals of care at that time.  No c/f hypercalcemia at present, serum albumin WNL May increase uric acid concentrations, no hx gout No hx kidney stones First-dose orthostatic hypotension is possible would need to be cautious following first few injections. Usually occurs w/I 4 hours of first few doses and often does self-resolve.   Unfortunately, both PTH analogues are daily sq injections which may present as a barrier to some patients, or may be difficult for those with dexterity concerns.   Daron Ellen, PharmD Clinical Pharmacist [facility] [phone]

## 2023-10-11 LAB — URINE CULTURE
MICRO NUMBER:: 16376619
SPECIMEN QUALITY:: ADEQUATE

## 2023-10-12 ENCOUNTER — Other Ambulatory Visit: Payer: Self-pay | Admitting: Family Medicine

## 2023-10-12 ENCOUNTER — Other Ambulatory Visit (INDEPENDENT_AMBULATORY_CARE_PROVIDER_SITE_OTHER): Payer: Self-pay | Admitting: Pharmacist

## 2023-10-12 DIAGNOSIS — M810 Age-related osteoporosis without current pathological fracture: Secondary | ICD-10-CM

## 2023-10-12 NOTE — Progress Notes (Signed)
 Many thanks. I will offer Teriparatide to her and we'll go from there.  Thanks.

## 2023-10-12 NOTE — Progress Notes (Signed)
 Left message for patient daughter [name] to return call

## 2023-10-12 NOTE — Progress Notes (Signed)
 Medication Access Therapy: Forteo/Tymlos   Medication Coverage: Yes - UHC Medicare (HMO/PPO)  Prior authorization:  Dx: M81.0 (osteoporosis) CMM Outcome: We predict Prior Authorization is not required  Patient assistance available if copay is a barrier:   - Forteo: Secretary/administrator if income <300% FPL (Group 1 medication)   - Tymlos: [employer]ist PAP if income <300% FPL  Income Limit (approximate)  1-Person Household: [phone]  2-Person Household: $59,160

## 2023-10-12 NOTE — Progress Notes (Signed)
 AV- see below.  Please offer Teriparatide to patient re: osteoporosis.  It is a daily injection that could reduce her risk of a fracture.  Please let me know if she wants to proceed.  We can talk about it at an OV or video visit if needed.  Thanks.

## 2023-10-14 NOTE — Progress Notes (Signed)
 Left message for [name] to return call

## 2023-10-16 ENCOUNTER — Encounter: Payer: Self-pay | Admitting: Family Medicine

## 2023-10-17 NOTE — Progress Notes (Signed)
 Left voicemail for patient to return call to office.

## 2023-10-17 NOTE — Progress Notes (Signed)
 Patient daughter [name] returned call.  She will ask her mother about the teriparatide  also about the referral to the spine clinic and let me know.  She also advises that her mother completed the antibiotic on yesterday but still complains of the burning. Not sure if antibiotic can be called in.  Also she did send money last week to the scammer guy.

## 2023-10-18 ENCOUNTER — Other Ambulatory Visit: Payer: Self-pay | Admitting: Family Medicine

## 2023-10-18 MED ORDER — CEPHALEXIN 500 MG PO CAPS: 500.0000 mg | ORAL_CAPSULE | Freq: Three times a day (TID) | ORAL | 0 refills | Status: DC

## 2023-10-18 NOTE — Progress Notes (Signed)
 Left message for patients daughter to advise that Dr. Vallarie Gauze sent in antibiotics for 5 additional days but to let us  know if that does not help because she would need to be rechecked in office. Also to let us  know about the referral and the prescription for osteoporosis.

## 2023-10-18 NOTE — Progress Notes (Signed)
 I extended her antibiotics for 5 days.  If she still has burning with urination after that then she needs to be rechecked.  I have encouraged her not to send money to strangers.  I do not know what else we can do, given that she still apparently has the ability to make her own decisions.  I will await update from patient/family.  Thanks.

## 2023-10-20 NOTE — Progress Notes (Signed)
 Left message for [name] to return call

## 2023-10-24 ENCOUNTER — Encounter: Payer: Self-pay | Admitting: Family Medicine

## 2023-10-24 NOTE — Progress Notes (Signed)
 Several attempts have been made to reach patient and daughter with no success. Will send letter.

## 2023-10-29 ENCOUNTER — Other Ambulatory Visit: Payer: Self-pay | Admitting: Family Medicine

## 2023-10-31 ENCOUNTER — Other Ambulatory Visit: Payer: Self-pay | Admitting: Family Medicine

## 2023-11-01 ENCOUNTER — Telehealth: Payer: Self-pay | Admitting: Family Medicine

## 2023-11-01 NOTE — Telephone Encounter (Signed)
 Per chart review look like this was response from MRI results from date of 09/30/23. Patient was not reached and letter was mailed to call office. Below is the attached notes from Provider review of MRI   I got the overread from radiology about your MRI.  Your MRI shows the old L2 fracture.  You have degenerative changes in your back in multiple locations.  If you continue to have back pain then I think it makes sense to set you up with the spine clinic.  Please let me know if you need a referral.  Take care.

## 2023-11-01 NOTE — Telephone Encounter (Signed)
 Copied from CRM [id number]. Topic: General - Other >> Oct 31, 2023 11:54 AM [name] D wrote: Reason for CRM: Patient returning call per communication from Dr. Vallarie Gauze. Read his message to patient regarding old L2 fracture/degenerative changes. Asked patient if she would like to set up a referral but she stated "Not at this time.".

## 2023-11-02 NOTE — Telephone Encounter (Signed)
 Noted.  If the patient wants to defer then I will not put in extra orders at this point.

## 2023-11-04 ENCOUNTER — Encounter

## 2023-11-10 ENCOUNTER — Other Ambulatory Visit: Payer: Self-pay | Admitting: Family Medicine

## 2023-11-11 NOTE — Telephone Encounter (Signed)
 LOV: 10/07/23 NOV: NOTHING SCHEDULED LAST REFILL:  LORAZEPAM  TAB 2MG  09/08/23 90 TABLES 1 REFILL   I did confirm with the pharmacy that she has no refills remaining

## 2023-11-13 NOTE — Telephone Encounter (Signed)
 Sent. Thanks.

## 2023-11-25 ENCOUNTER — Other Ambulatory Visit: Payer: Self-pay | Admitting: Family Medicine

## 2023-11-26 ENCOUNTER — Other Ambulatory Visit: Payer: Self-pay | Admitting: Family Medicine

## 2023-12-05 ENCOUNTER — Other Ambulatory Visit: Payer: Self-pay | Admitting: Family Medicine

## 2023-12-22 ENCOUNTER — Other Ambulatory Visit: Payer: Self-pay | Admitting: Family Medicine

## 2023-12-24 ENCOUNTER — Other Ambulatory Visit: Payer: Self-pay | Admitting: Family Medicine

## 2023-12-25 ENCOUNTER — Other Ambulatory Visit: Payer: Self-pay | Admitting: Family Medicine

## 2023-12-27 ENCOUNTER — Other Ambulatory Visit: Payer: Self-pay | Admitting: Family Medicine

## 2024-01-09 ENCOUNTER — Other Ambulatory Visit: Payer: Self-pay | Admitting: Family Medicine

## 2024-01-09 LAB — HM DIABETES EYE EXAM

## 2024-01-10 DIAGNOSIS — H524 Presbyopia: Secondary | ICD-10-CM | POA: Diagnosis not present

## 2024-01-10 DIAGNOSIS — H2513 Age-related nuclear cataract, bilateral: Secondary | ICD-10-CM | POA: Diagnosis not present

## 2024-01-10 DIAGNOSIS — Z135 Encounter for screening for eye and ear disorders: Secondary | ICD-10-CM | POA: Diagnosis not present

## 2024-01-11 NOTE — Telephone Encounter (Signed)
 LOV: 10/07/23 [id number] scheduled Last Refill: LORazepam  (ATIVAN ) 2 MG tablet 11/13/23 90 tavlets 1 refills

## 2024-01-11 NOTE — Telephone Encounter (Unsigned)
 Copied from CRM [id number]. Topic: General - Call Back - No Documentation >> Jan 11, 2024  2:27 PM Leah C wrote: Reason for CRM: Patient called to follow up on refill for LORazepam  (ATIVAN ) 2 MG tablet. She stated she called [facility] and they stated they did not receive anything from the clinic.

## 2024-01-11 NOTE — Telephone Encounter (Signed)
 Sent. Thanks.

## 2024-01-23 ENCOUNTER — Other Ambulatory Visit: Payer: Self-pay | Admitting: Family Medicine

## 2024-02-02 ENCOUNTER — Other Ambulatory Visit: Payer: Self-pay | Admitting: Family Medicine

## 2024-02-02 ENCOUNTER — Encounter: Payer: Self-pay | Admitting: Pharmacist

## 2024-02-02 MED ORDER — METFORMIN HCL 500 MG PO TABS: ORAL_TABLET | ORAL | 0 refills | Status: DC

## 2024-02-02 NOTE — Progress Notes (Signed)
 Pharmacy Quality Measure Review  This patient is appearing on a report for being at risk of failing the adherence measure for diabetes medications this calendar year.   Medication: metformin  500 mg Last fill date: 10/31/23 for 90 day supply  No refills on current prescription.  Refill overdue.   Msg sent to CP refill pool.

## 2024-02-02 NOTE — Progress Notes (Signed)
 Rx sent with note to schedule follow up.

## 2024-02-23 DIAGNOSIS — N390 Urinary tract infection, site not specified: Secondary | ICD-10-CM | POA: Diagnosis not present

## 2024-02-25 ENCOUNTER — Other Ambulatory Visit: Payer: Self-pay | Admitting: Family Medicine

## 2024-02-29 ENCOUNTER — Other Ambulatory Visit: Payer: Self-pay | Admitting: Family Medicine

## 2024-03-02 ENCOUNTER — Ambulatory Visit: Admitting: Family Medicine

## 2024-03-02 VITALS — BP 122/76 | HR 54 | Temp 98.3°F | Ht 60.0 in | Wt 127.0 lb

## 2024-03-02 DIAGNOSIS — M549 Dorsalgia, unspecified: Secondary | ICD-10-CM

## 2024-03-02 DIAGNOSIS — R300 Dysuria: Secondary | ICD-10-CM

## 2024-03-02 DIAGNOSIS — E119 Type 2 diabetes mellitus without complications: Secondary | ICD-10-CM | POA: Diagnosis not present

## 2024-03-02 MED ORDER — AMLODIPINE BESYLATE 10 MG PO TABS: 10.0000 mg | ORAL_TABLET | Freq: Every day | ORAL | 3 refills | Status: DC

## 2024-03-02 MED ORDER — SERTRALINE HCL 50 MG PO TABS: 150.0000 mg | ORAL_TABLET | Freq: Every day | ORAL | 3 refills | Status: AC

## 2024-03-02 MED ORDER — LORAZEPAM 2 MG PO TABS: 1.0000 mg | ORAL_TABLET | Freq: Three times a day (TID) | ORAL | 1 refills | Status: DC | PRN

## 2024-03-02 MED ORDER — LISINOPRIL 5 MG PO TABS: 5.0000 mg | ORAL_TABLET | Freq: Every day | ORAL | 3 refills | Status: DC

## 2024-03-02 MED ORDER — METFORMIN HCL 500 MG PO TABS: ORAL_TABLET | ORAL | Status: AC

## 2024-03-02 MED ORDER — CEPHALEXIN 500 MG PO CAPS: 500.0000 mg | ORAL_CAPSULE | Freq: Three times a day (TID) | ORAL | 0 refills | Status: DC

## 2024-03-02 NOTE — Progress Notes (Signed)
 Diabetes:  Using medications without difficulties: see below Hypoglycemic episodes:no Hyperglycemic episodes:no Feet problems:no Blood Sugars averaging: usually 100-200.   eye exam within last year: yes She had to cut back to 1 metformin  BID.   Labs pending.   Back pain, upper and lower back pain, on L vs R side. Pain is better sitting, worse standing.  No recent gabapentin  use.  It didn't seem to help much in the past.  Tylenol  and tramadol  didn't help.    Ongoing tx for UTI with cefdinir.  Burning with urination.  No discharge.  No improvement over the last week or so.  No fevers.    PMH and SH reviewed  Meds, vitals, and allergies reviewed.   ROS: Per HPI unless specifically indicated in ROS section   GEN: nad, alert and oriented HEENT: mucous membranes moist NECK: supple w/o LA CV: rrr. PULM: ctab, no inc wob ABD: soft, +bs EXT: no edema SKIN: no acute rash Back not ttp at the time of OV  Diabetic foot exam: Normal inspection No skin breakdown No calluses  Normal DP pulses Normal sensation to light touch and monofilament Nails normal  32 minutes were devoted to patient care in this encounter (this includes time spent reviewing the patient's file/history, interviewing and examining the patient, counseling/reviewing plan with patient).

## 2024-03-02 NOTE — Patient Instructions (Addendum)
 Go to the lab on the way out.   If you have mychart we'll likely use that to update you.    Stop cefdinir and change to keflex .  Take care.  Glad to see you. Refer for home health PT and try using the patches on your back for pain.

## 2024-03-04 ENCOUNTER — Ambulatory Visit: Payer: Self-pay | Admitting: Family Medicine

## 2024-03-04 DIAGNOSIS — B029 Zoster without complications: Secondary | ICD-10-CM

## 2024-03-04 NOTE — Assessment & Plan Note (Signed)
 No recent gabapentin  use.  It didn't seem to help much in the past.  Tylenol  and tramadol  didn't help.   Discussed options.  Refer for PT and can try lidocaine  patches in the back.

## 2024-03-04 NOTE — Assessment & Plan Note (Signed)
 Concern for cystitis.  See notes on labs, changed from cefdinir to Keflex .  At this point still okay for outpatient follow-up.

## 2024-03-04 NOTE — Assessment & Plan Note (Signed)
 She had to cut back to 1 metformin  BID.   Labs pending.  No change in meds at this point.

## 2024-03-08 LAB — BASIC METABOLIC PANEL WITH GFR
BUN/Creatinine Ratio: 17 (calc) (ref 6–22)
BUN: 10 mg/dL (ref 7–25)
CO2: 25 mmol/L (ref 20–32)
Calcium: 10 mg/dL (ref 8.6–10.4)
Chloride: 105 mmol/L (ref 98–110)
Creat: 0.59 mg/dL — ABNORMAL LOW (ref 0.60–0.95)
Glucose, Bld: 138 mg/dL — ABNORMAL HIGH (ref 65–99)
Potassium: 4.7 mmol/L (ref 3.5–5.3)
Sodium: 138 mmol/L (ref 135–146)
eGFR: 88 mL/min/1.73m2 (ref >=60)

## 2024-03-08 LAB — HEMOGLOBIN A1C
Hgb A1c MFr Bld: 7.6 % — ABNORMAL HIGH (ref <5.7)
Mean Plasma Glucose: 171 mg/dL
eAG (mmol/L): 9.5 mmol/L

## 2024-03-08 LAB — URINE CULTURE

## 2024-03-26 DIAGNOSIS — H353112 Nonexudative age-related macular degeneration, right eye, intermediate dry stage: Secondary | ICD-10-CM | POA: Diagnosis not present

## 2024-03-26 DIAGNOSIS — H353121 Nonexudative age-related macular degeneration, left eye, early dry stage: Secondary | ICD-10-CM | POA: Diagnosis not present

## 2024-03-26 DIAGNOSIS — H25813 Combined forms of age-related cataract, bilateral: Secondary | ICD-10-CM | POA: Diagnosis not present

## 2024-04-05 ENCOUNTER — Encounter

## 2024-04-05 NOTE — Progress Notes (Signed)
 Patient decided not to complete visit . Please Disregard

## 2024-04-06 ENCOUNTER — Ambulatory Visit

## 2024-04-09 ENCOUNTER — Ambulatory Visit: Payer: Self-pay

## 2024-04-09 NOTE — Telephone Encounter (Signed)
 Please see about getting her scheduled as soon as feasible at any location.  Thanks.

## 2024-04-09 NOTE — Telephone Encounter (Signed)
 FYI Only or Action Required?: Action required by provider: request for appointment. Pt only wants to be seen by her PCP tomorrow. Please call daughter [name] Leader at [phone] so see if she can be scheduled.  Patient was last seen in primary care on 03/02/2024 by Cleatus Arlyss RAMAN, MD.  Called Nurse Triage reporting Fall and Pain.  Symptoms began several weeks ago.  Interventions attempted: OTC medications: Tylenol , ibuprofen, lidocaine  patches.  Symptoms are: gradually worsening.  Triage Disposition: See HCP Within 4 Hours (Or PCP Triage)  Patient/caregiver understands and will follow disposition?: No, wishes to speak with PCP  Copied from CRM [id number]. Topic: Clinical - Red Word Triage >> Apr 09, 2024 11:19 AM Frederich PARAS wrote: [facility] that prompted transfer to Nurse Triage: pain in tail bone  [name] daughter calling to schedule an apt for mother however NT due to red words Pain in buttocks and hip, pt did fall about 2 week ago and its getting worst. Small bruise on tail bone. Foot was bruised but is not a problem anymore Reason for Disposition  [1] MODERATE weakness (e.g., interferes with work, school, normal activities) AND [2] new-onset or getting worse  Answer Assessment - Initial Assessment Questions Speaking with daughter [name] (on [place]) who is not currently with pt. Not taking blood thinners. No neuro changes. Has not been seen by provider yet. No availability in office today at [facility] with any providers. Declines seeing a different provider at separate location. Daughter reports pt only wants to see her PCP tomorrow. Forwarding message to office requesting call to pts daughter. Advised UC for worsening symptoms.  1. MECHANISM: How did the fall happen?     Going to the BR and fell backwards.  2. DOMESTIC VIOLENCE AND ELDER ABUSE SCREENING: Did you fall because someone pushed you or tried to hurt you? If Yes, ask: Are you safe now?     No  3. ONSET: When did the  fall happen? (e.g., minutes, hours, or days ago)     2 weeks ago  4. LOCATION: What part of the body hit the ground? (e.g., back, buttocks, head, hips, knees, hands, head, stomach)     Butt, right elbow, right foot. Did not hit head per daughter. Pt never complained about head hurting.  5. INJURY: Did you hurt (injure) yourself when you fell? If Yes, ask: What did you injure? Tell me more about this? (e.g., body area; type of injury; pain severity)     Small bruise on tailbone. Third toe on right foot bruised and swollen, improving.  6. PAIN: Is there any pain? If Yes, ask: How bad is the pain? (e.g., Scale 0-10; or none, mild,      Moderate-severe pain in tailbone, c/o mild soreness in right foot. Taking tylenol , ibuprofen and lidocaine  patches.  7. SIZE: For cuts, bruises, or swelling, ask: How large is it? (e.g., inches or centimeters)      Small bruise on tailbone, some swelling and bruising on top of right foot that is improving.  9. OTHER SYMPTOMS: Do you have any other symptoms? (e.g., dizziness, fever, weakness; new-onset or worsening).      No. Currently getting around with a cane all the time now. Used to only use cane occasionally.  10. CAUSE: What do you think caused the fall (or falling)? (e.g., dizzy spell, tripped)       Unsure, daughter thinks pt may have lost footing while going to the BR.  Protocols used: Falls and [facility]

## 2024-04-10 NOTE — Telephone Encounter (Signed)
 Lvm asking pt's daughter, [name] (on dpr), to call back. Pls relay Dr Elfredia message about getting pt scheduled asap.

## 2024-04-11 ENCOUNTER — Encounter: Payer: Self-pay | Admitting: General Practice

## 2024-04-11 ENCOUNTER — Ambulatory Visit: Admitting: General Practice

## 2024-04-11 ENCOUNTER — Ambulatory Visit: Payer: Self-pay | Admitting: General Practice

## 2024-04-11 VITALS — BP 130/58 | HR 64 | Temp 98.3°F | Ht 60.0 in | Wt 123.0 lb

## 2024-04-11 DIAGNOSIS — N3000 Acute cystitis without hematuria: Secondary | ICD-10-CM | POA: Insufficient documentation

## 2024-04-11 DIAGNOSIS — B029 Zoster without complications: Secondary | ICD-10-CM | POA: Insufficient documentation

## 2024-04-11 DIAGNOSIS — R300 Dysuria: Secondary | ICD-10-CM

## 2024-04-11 DIAGNOSIS — M549 Dorsalgia, unspecified: Secondary | ICD-10-CM

## 2024-04-11 LAB — POC URINALSYSI DIPSTICK (AUTOMATED)
Bilirubin, UA: 1
Blood, UA: NEGATIVE
Glucose, UA: NEGATIVE
Ketones, UA: NEGATIVE
Nitrite, UA: POSITIVE
Protein, UA: POSITIVE — AB
Spec Grav, UA: 1.02 (ref 1.010–1.025)
Urobilinogen, UA: 0.2 U/dL
pH, UA: 6 (ref 5.0–8.0)

## 2024-04-11 MED ORDER — VALACYCLOVIR HCL 1 G PO TABS: 1000.0000 mg | ORAL_TABLET | Freq: Three times a day (TID) | ORAL | 0 refills | Status: AC

## 2024-04-11 MED ORDER — NITROFURANTOIN MONOHYD MACRO 100 MG PO CAPS: 100.0000 mg | ORAL_CAPSULE | Freq: Two times a day (BID) | ORAL | 0 refills | Status: AC

## 2024-04-11 NOTE — Patient Instructions (Addendum)
 Start Macrobid  (nitrofurantoin ) tablets for urinary tract infection. Take 1 tablet by mouth twice daily for 5 days.  Start Valacyclovir 1000 mg three times a day for 7 days for shingles.   You can take tylenol  as needed for pain. Ok to use heat or ice as tolerated for back pain.   F/u in one week for back pain with PCP.   May cancel if better.   It was a pleasure meeting you!

## 2024-04-11 NOTE — Progress Notes (Signed)
 Established Patient Office Visit  Subjective   Patient ID: [name], female    DOB: 05/26/1941  Age: 83 y.o. MRN: [id number]  Chief Complaint  Patient presents with   Back Pain    And side pain from fall 2 weeks ago. Patient states it is getting somewhat better since the fall but still present. Patient has been taking tylenol  for the pain.    Dysuria    Patient has been having several UTIs; urinary odor, itching freq/urgency since her last one. Patient states she never got any better from the last one. Patient was taking keflex  back in September.     Back Pain Associated symptoms include dysuria. Pertinent negatives include no abdominal pain, chest pain, fever or headaches.  Dysuria  Associated symptoms include flank pain, frequency and urgency. Pertinent negatives include no chills, nausea or vomiting.    Discussed the use of AI scribe software for clinical note transcription with the patient, who gave verbal consent to proceed.  History of Present Illness [name] is an 83 year old female who presents with back pain following a fall. She is accompanied by her daughter, [name].  She has experienced multiple falls, with the most recent occurring while walking to the bathroom, resulting in a backward fall where she hit her head and elbow, and bruised her foot. The bruising on her foot has improved, and she is now able to bear weight on it.  Her worst pain is located on the left side of her back, near the tailbone, and started a few days ago. Pain is not radiating. She is not having pain with movement.   She has symptoms of a urinary tract infection, including itching, frequency, urgency, and an odor during urination. Initially, she experienced burning during urination, but this has improved. No vaginal discharge, fever, chills, nausea, diarrhea, chest pain, shortness of breath, or dizziness. She reports headaches, which are not new.  She has a history of using Icy Hot  for pain relief, which recently caused itching and a burn-like reaction on her skin. Aloe vera provided some relief.  She developed symptoms including itching and a rash in the affected area within the last 72 hours.  Patient Active Problem List   Diagnosis Date Noted   Herpes zoster without complication 04/11/2024   Acute cystitis without hematuria 04/11/2024   Stye 01/07/2023   Lung nodules 11/08/2022   Decreased independence with transfers 10/14/2022   Impaired mobility and ADLs 10/14/2022   Multiple falls 10/11/2022   Drug-induced myopathy 12/27/2021   Memory change 07/06/2019   B12 deficiency 07/06/2019   Healthcare maintenance 10/16/2018   Back pain 10/16/2018   Left knee pain 09/30/2016   Hypertension    Colovaginal fistula s/p LAR rectosigmoid resection & colovaginal fistula repair/omentopexy 05/14/2016 05/14/2016   Dysuria 08/05/2015   Advance care planning 01/22/2015   Medicare annual wellness visit, initial 10/08/2013   Breast cancer of upper-inner quadrant of left female breast (HCC) 08/12/2011   Iron deficiency anemia 11/20/2010   SHOULDER PAIN, LEFT 07/03/2010   DM (diabetes mellitus) (HCC) 01/28/2010   HLD (hyperlipidemia) 01/27/2010   GERD 01/27/2010   Osteoporosis 01/27/2010   Insomnia 01/27/2010   Anxiety state 01/02/2010   Past Medical History:  Diagnosis Date   Anemia    Anxiety    Blood transfusion    Blood transfusion without reported diagnosis 1983   prbc   Breast cancer (HCC) 03/01/12   left breast lumpectomy=invasive ductal ca,grade I/III,INVILVES SKELATAL MUSCLE  er=+,pr=NEG. HER2 NEG.   Cancer (HCC) 08/09/11 DX    LEFT   Depression    Diabetes mellitus 2011   type II, on pills    Diverticulosis    Dyspnea    with exertion if walks a long ways per patient    GERD (gastroesophageal reflux disease)    Headache    History of radiation therapy    RIGHT JAW 5 TXS FOR ABSESSED TOOTH IN HER 30'S    History of radiation therapy 04/10/12-05/26/12    left breast   Hyperlipidemia    Hypertension    Insomnia, unspecified    Osteoporosis, unspecified    with prev fosamax treatment   Other abnormal glucose    Personal history of radiation therapy    PONV (postoperative nausea and vomiting)    Past Surgical History:  Procedure Laterality Date   ABDOMINAL HYSTERECTOMY  1983   b/ls alpingooph orectomy 4 years later   BILATERAL OOPHORECTOMY  1987   BREAST BIOPSY     BREAST LUMPECTOMY Left 03/01/2012   LEFT=INVASIVE DUCTAL CA,gRADE i/iii,INVASION AT PSTERIOR RESECTION MARGIN AND INVLOVES SKE;ATAL MUSCLE,PERINEURAL INVASION IDENTIFIED   FRACTURE SURGERY     INGUINAL HERNIA REPAIR Left 1999   [facility]   ORIF FEMUR FRACTURE Right    titanium rod   SIGMOIDOSCOPY     TUBAL LIGATION  1975   Allergies  Allergen Reactions   Bisphosphonates     Unclear relation to meds, but pt had long bone fracture after treatment with fosamax   Diclofenac Potassium     Not sure   Naproxen Sodium     rash   Sulfonamide Derivatives    Trazodone  And Nefazodone     Insomnia.    Ezetimibe-Simvastatin     myalgias   Simvastatin     myalgias         04/11/2024   11:45 AM 10/07/2023   12:31 PM 10/01/2022    3:39 PM  Depression screen PHQ 2/9  Decreased Interest 1 0 1  Down, Depressed, Hopeless 1 0 1  PHQ - 2 Score 2 0 2  Altered sleeping 1 0 1  Tired, decreased energy 1 1 1   Change in appetite 1 0 0  Feeling bad or failure about yourself  1 0 0  Trouble concentrating 1 0 0  Moving slowly or fidgety/restless 1 0 0  Suicidal thoughts 0 0 0  PHQ-9 Score 8 1 4   Difficult doing work/chores Not difficult at all Not difficult at all Somewhat difficult       04/11/2024   11:45 AM 10/07/2023   12:31 PM 10/01/2022    3:39 PM  GAD 7 : Generalized Anxiety Score  Nervous, Anxious, on Edge 1 2 1   Control/stop worrying 1 2 1   Worry too much - different things 1 2 1   Trouble relaxing 1 1 0  Restless 0 0 0  Easily annoyed or irritable 0 0 0  Afraid -  awful might happen 1 1 1   Total GAD 7 Score 5 8 4   Anxiety Difficulty Not difficult at all Not difficult at all Somewhat difficult      Review of Systems  Constitutional:  Negative for chills and fever.  Respiratory:  Negative for shortness of breath.   Cardiovascular:  Negative for chest pain.  Gastrointestinal:  Negative for abdominal pain, constipation, diarrhea, heartburn, nausea and vomiting.  Genitourinary:  Positive for dysuria, flank pain, frequency and urgency.  Musculoskeletal:  Positive for back pain.  Neurological:  Negative for dizziness and headaches.  Endo/Heme/Allergies:  Negative for polydipsia.  Psychiatric/Behavioral:  Negative for depression and suicidal ideas. The patient is not nervous/anxious.       Objective:     BP (!) 130/58   Pulse 64   Temp 98.3 F (36.8 C) (Oral)   Ht 5' (1.524 m)   Wt 123 lb (55.8 kg)   SpO2 92%   BMI 24.02 kg/m  BP Readings from Last 3 Encounters:  04/11/24 (!) 130/58  03/02/24 122/76  10/07/23 128/74   Wt Readings from Last 3 Encounters:  04/11/24 123 lb (55.8 kg)  03/02/24 127 lb (57.6 kg)  10/07/23 124 lb 3.2 oz (56.3 kg)      Physical Exam Vitals and nursing note reviewed.  Constitutional:      Appearance: Normal appearance.  Cardiovascular:     Rate and Rhythm: Normal rate and regular rhythm.     Pulses: Normal pulses.     Heart sounds: Normal heart sounds.  Pulmonary:     Effort: Pulmonary effort is normal.     Breath sounds: Normal breath sounds.  Abdominal:     Tenderness: There is no right CVA tenderness or left CVA tenderness.  Skin:    General: Skin is warm.     Findings: Rash present. Rash is vesicular.         Comments: Vesicular rash present on exam  Neurological:     Mental Status: She is alert and oriented to person, place, and time.  Psychiatric:        Mood and Affect: Mood normal.        Behavior: Behavior normal.        Thought Content: Thought content normal.        Judgment:  Judgment normal.      Results for orders placed or performed in visit on 04/11/24  POCT Urinalysis Dipstick (Automated)  Result Value Ref Range   Color, UA amber    Clarity, UA slightly cloudy    Glucose, UA Negative Negative   Bilirubin, UA 1 mg/dL    Ketones, UA neg    Spec Grav, UA 1.020 1.010 - 1.025   Blood, UA neg    pH, UA 6.0 5.0 - 8.0   Protein, UA Positive (A) Negative   Urobilinogen, UA 0.2 0.2 or 1.0 E.U./dL   Nitrite, UA positive    Leukocytes, UA Small (1+) (A) Negative    Last CBC Lab Results  Component Value Date   WBC 10.4 08/08/2023   HGB 13.1 08/08/2023   HCT 39.6 08/08/2023   MCV 91.1 08/08/2023   MCH 29.4 10/01/2022   RDW 13.5 08/08/2023   PLT 371.0 08/08/2023      The ASCVD Risk score (Arnett DK, et al., 2019) failed to calculate for the following reasons:   The 2019 ASCVD risk score is only valid for ages 58 to 11    Assessment & Plan:  Dysuria -     POCT Urinalysis Dipstick (Automated) -     Urine Culture  Acute cystitis without hematuria -     Nitrofurantoin  Monohyd Macro; Take 1 capsule (100 mg total) by mouth 2 (two) times daily for 5 days.  Dispense: 10 capsule; Refill: 0  Herpes zoster without complication -     valACYclovir HCl; Take 1 tablet (1,000 mg total) by mouth 3 (three) times daily for 7 days.  Dispense: 21 tablet; Refill: 0  Back pain, unspecified back location, unspecified back pain laterality, unspecified chronicity  Assessment and Plan Assessment & Plan Acute urinary tract infection UTI confirmed by urine nitrites and leukocytes. Culture results pending.  - Reviewed BMP from September.  - Prescribed nitrofurantoin  100 mg BID for 5 days; given sulfa allergy. - Sent urine for culture; adjust antibiotic based on results. - Advised increased water.  Herpes zoster (shingles) Shingles confirmed with rash. Rash appeared within 72 hours, allowing effective antiviral treatment. Previously declined shingles vaccine. -  Prescribed valaciclovir 1000 mg TID for 7 days. - Recommended aloe vera or Benadryl  cream for pruritus.  Back pain after fall Back pain possibly related to fall, UTI, or shingles. No fracture suspected. - Continue acetaminophen  as needed. - Use heat or ice, avoiding shingles area. - Follow up in one week; consider x-ray if pain persists or worsens. - Advised ER visit if incontinence or worsening pain occurs.   Return in about 1 week (around 04/18/2024) for back pain.    Carrol Aurora, NP

## 2024-04-13 LAB — URINE CULTURE
MICRO NUMBER:: 17164036
SPECIMEN QUALITY:: ADEQUATE

## 2024-04-16 MED ORDER — GABAPENTIN 300 MG PO CAPS: 300.0000 mg | ORAL_CAPSULE | Freq: Three times a day (TID) | ORAL | 0 refills | Status: DC | PRN

## 2024-04-16 NOTE — Telephone Encounter (Signed)
 Attempted to call patient's daughter and left message.   Wanted to get more information about the patients symptoms and pain.

## 2024-04-19 ENCOUNTER — Encounter: Payer: Self-pay | Admitting: Family Medicine

## 2024-04-19 ENCOUNTER — Ambulatory Visit: Admitting: Family Medicine

## 2024-04-19 VITALS — BP 122/76 | HR 66 | Temp 98.1°F | Ht 60.0 in | Wt 121.1 lb

## 2024-04-19 DIAGNOSIS — Z87440 Personal history of urinary (tract) infections: Secondary | ICD-10-CM

## 2024-04-19 DIAGNOSIS — B029 Zoster without complications: Secondary | ICD-10-CM

## 2024-04-19 DIAGNOSIS — M549 Dorsalgia, unspecified: Secondary | ICD-10-CM

## 2024-04-19 MED ORDER — GABAPENTIN 300 MG PO CAPS
300.0000 mg | ORAL_CAPSULE | Freq: Three times a day (TID) | ORAL | Status: AC | PRN
Start: 2024-04-19 — End: ?

## 2024-04-19 NOTE — Progress Notes (Signed)
 Back pain is better in the meantime but still sore.  Using a cane at baseline.  Fall cautions d/w pt.  No syncope.   L lower abd and back rash improving.  Pain is clearly better.  Prev dx'd with shingles. Not hypersensitive now.  Opiate cautions d/w pt.  D/w pt about gabapentin  use if needed.    No burning with urination now.  Recheck ucx pending.   Meds, vitals, and allergies reviewed.   ROS: Per HPI unless specifically indicated in ROS section   Nad Ncat Neck supple, no LA Rrr Ctab Abd soft not ttp Postinflammatory changes noted in dermatomal distribution on the left side of the abdomen without hypersensitivity or tenderness now. Skin well-perfused.

## 2024-04-19 NOTE — Patient Instructions (Addendum)
 Check with the pharmacy about a shingles shot in a few months.   Go to the lab on the way out.   If you have mychart we'll likely use that to update you.    Take care.  Glad to see you.  Try using gabapentin  for back pain if needed.

## 2024-04-20 LAB — URINE CULTURE
MICRO NUMBER:: 17200723
SPECIMEN QUALITY:: ADEQUATE

## 2024-04-22 ENCOUNTER — Ambulatory Visit: Payer: Self-pay | Admitting: Family Medicine

## 2024-04-22 DIAGNOSIS — Z87440 Personal history of urinary (tract) infections: Secondary | ICD-10-CM | POA: Insufficient documentation

## 2024-04-22 NOTE — Assessment & Plan Note (Signed)
See notes on urine culture. 

## 2024-04-22 NOTE — Assessment & Plan Note (Signed)
 Can use gabapentin  if needed but fortunately her pain is better in the meantime.  Routine cautions given to patient.

## 2024-04-22 NOTE — Assessment & Plan Note (Signed)
 Can use gabapentin  if needed but fortunately her pain is better in the meantime.

## 2024-05-01 ENCOUNTER — Other Ambulatory Visit: Payer: Self-pay | Admitting: Family Medicine

## 2024-05-01 DIAGNOSIS — F411 Generalized anxiety disorder: Secondary | ICD-10-CM

## 2024-05-02 NOTE — Telephone Encounter (Signed)
 Name of Medication:  Lorazepam  Name of Pharmacy:  [facility] Last Fill or Written Date and Quantity:  04/05/24, #90 Last Office Visit and Type:  04/19/24, back pain f/u Next Office Visit and Type:  none Last Controlled Substance Agreement Date:  none Last UDS:  none

## 2024-05-30 ENCOUNTER — Other Ambulatory Visit: Payer: Self-pay | Admitting: Family Medicine

## 2024-06-18 ENCOUNTER — Other Ambulatory Visit: Payer: Self-pay | Admitting: Family Medicine

## 2024-06-29 ENCOUNTER — Other Ambulatory Visit: Payer: Self-pay | Admitting: Family Medicine

## 2024-06-29 DIAGNOSIS — F411 Generalized anxiety disorder: Secondary | ICD-10-CM

## 2024-06-29 NOTE — Telephone Encounter (Signed)
 Name of Medication:  Lorazepam  Name of Pharmacy:  [facility] Last Fill or Written Date and Quantity:  06/01/24, #90 Last Office Visit and Type:  04/19/24, back pain f/u Next Office Visit and Type:  none Last Controlled Substance Agreement Date:  none Last UDS:  none

## 2024-07-01 NOTE — Telephone Encounter (Signed)
 Sent. Thanks.  Needs OV re: recheck this spring.  We can do labs at the visit.  Please schedule.

## 2024-07-06 ENCOUNTER — Ambulatory Visit: Payer: Self-pay

## 2024-07-06 NOTE — Telephone Encounter (Signed)
 Unable to reach pt by phone and left v/m for [name] (DPR signed)to call [phone]. If after office has closed E2C2 can let pt know that pt would need to go to UC for eval and testing. Dr Cleatus is not in office today and pt needs to have U/A ands CS done with eval for treatment. Sending note to Dr Cleatus who is out of office, Dr Anton Blas who is in office and [place] pool.

## 2024-07-06 NOTE — Telephone Encounter (Signed)
" ° °  Summary: Red Word: UTI (burning, smell)   Reason for Triage: Patients daughter [name] called in on behalf of the patient, she states the patient may have a UTI. Her symptoms include: burning, smell, denies any other symptoms      Reason for Disposition  Bad or foul-smelling urine  Answer Assessment - Initial Assessment Questions Dtr called stating that her mom has another UTI, same symptoms as always, had been having them frequently this year. Requested that Dr. Cleatus send something in. Rn advised given the time of day, Friday, and weather, would recommend she go to UC to be evaluated so that if it is an infection she doesn't have to wait until Monday or Tuesday to be treated.  She denies any higher acuity symptoms. States if Dr. Cleatus calls her something in, would want it send to [facility] on [address] in [place]     1. SYMPTOM: What's the main symptom you're concerned about? (e.g., frequency, incontinence)     Burning, itching, four odor 2. ONSET: When did the  symptoms  start?      3. PAIN: Is there any pain? If Yes, ask: How bad is it? (Scale: 1-10; mild, moderate, severe)     burning 4. CAUSE: What do you think is causing the symptoms?     Dtr thinks uti 5. OTHER SYMPTOMS: Do you have any other symptoms? (e.g., blood in urine, fever, flank pain, pain with urination)     Pain, itching  Protocols used: Urinary Symptoms-A-AH  "

## 2024-07-06 NOTE — Telephone Encounter (Signed)
 Agree with recommendations made. Plz call next week for f/u ensure she was evaluated for this.

## 2024-07-06 NOTE — Telephone Encounter (Signed)
 FYI Only or Action Required?: Action required by provider: requesting medication for UTI be called in. RN advised UC.SABRA  Patient was last seen in primary care on 04/19/2024 by Cleatus Arlyss RAMAN, MD.  Called Nurse Triage reporting Dysuria.  Symptoms began unknown.  Interventions attempted: Nothing.  Symptoms are: gradually worsening.  Triage Disposition: See Physician Within 24 Hours  Patient/caregiver understands and will follow disposition?: Unsure

## 2024-07-09 NOTE — Telephone Encounter (Signed)
 Unable to reach pt at this time, left voicemail on mobile and home phone instructing pt to call back for lwhen able   Please provide document wether not pt was seen for dysuria if call back is received

## 2024-07-11 NOTE — Telephone Encounter (Signed)
 Agreed.  Thank you for checking on this.  Will await update from patient/family.

## 2024-07-11 NOTE — Telephone Encounter (Signed)
 Lvm asking pt to call back. Need to see if pt was seen at [facility] for possible UTI. If not, pt will need to schedule OV for sxs.
